# Patient Record
Sex: Female | Born: 1955 | Race: White | Hispanic: No | Marital: Married | Smoking: Former smoker
Health system: Southern US, Community
[De-identification: ages and names within clinical notes are randomized; demographics above are authoritative.]

## PROBLEM LIST (undated history)

## (undated) DIAGNOSIS — C50212 Malignant neoplasm of upper-inner quadrant of left female breast: Secondary | ICD-10-CM

## (undated) DIAGNOSIS — Z720 Tobacco use: Secondary | ICD-10-CM

## (undated) DIAGNOSIS — F32A Depression, unspecified: Secondary | ICD-10-CM

## (undated) DIAGNOSIS — Z9221 Personal history of antineoplastic chemotherapy: Secondary | ICD-10-CM

## (undated) DIAGNOSIS — J189 Pneumonia, unspecified organism: Secondary | ICD-10-CM

## (undated) DIAGNOSIS — F329 Major depressive disorder, single episode, unspecified: Secondary | ICD-10-CM

## (undated) DIAGNOSIS — F419 Anxiety disorder, unspecified: Secondary | ICD-10-CM

## (undated) HISTORY — DX: Malignant neoplasm of upper-inner quadrant of left female breast: C50.212

## (undated) HISTORY — PX: CHOLECYSTECTOMY: SHX55

## (undated) HISTORY — DX: Anxiety disorder, unspecified: F41.9

## (undated) HISTORY — PX: OTHER SURGICAL HISTORY: SHX169

---

## 2009-06-01 ENCOUNTER — Ambulatory Visit (HOSPITAL_COMMUNITY): Admission: RE | Admit: 2009-06-01 | Discharge: 2009-06-02 | Payer: Self-pay | Admitting: Obstetrics and Gynecology

## 2009-06-01 ENCOUNTER — Encounter (INDEPENDENT_AMBULATORY_CARE_PROVIDER_SITE_OTHER): Payer: Self-pay | Admitting: Obstetrics and Gynecology

## 2009-08-28 ENCOUNTER — Encounter: Admission: RE | Admit: 2009-08-28 | Discharge: 2009-08-28 | Payer: Self-pay | Admitting: Obstetrics and Gynecology

## 2009-10-21 HISTORY — PX: ABDOMINAL HYSTERECTOMY: SHX81

## 2010-07-06 ENCOUNTER — Encounter (INDEPENDENT_AMBULATORY_CARE_PROVIDER_SITE_OTHER): Payer: Self-pay | Admitting: *Deleted

## 2010-08-17 ENCOUNTER — Encounter (INDEPENDENT_AMBULATORY_CARE_PROVIDER_SITE_OTHER): Payer: Self-pay

## 2010-08-21 ENCOUNTER — Ambulatory Visit: Payer: Self-pay | Admitting: Gastroenterology

## 2010-09-05 ENCOUNTER — Ambulatory Visit: Payer: Self-pay | Admitting: Gastroenterology

## 2010-09-11 ENCOUNTER — Encounter: Payer: Self-pay | Admitting: Gastroenterology

## 2010-11-20 NOTE — Letter (Signed)
Summary: Results Letter  [facility]  [address] [place], [place] [address]   Phone: [phone]  Fax: [phone]        September 11, 2010 MRN: [id number]    [facility] [address] [place], [place]  [address]    Dear Ms. Heppler,  Good news.  The polyp(s) that were removed during your recent procedure were NOT pre-cancerous.  Given your daughter's history of pre-cancerous colon polyps you will need a repeat colonoscopy in 5 years.  We will put your information in our reminder system and contact you around that time.     Sincerely,  Rachael Fee MD  This letter has been electronically signed by your physician.  Appended Document: Results Letter Letter mailed

## 2010-11-20 NOTE — Letter (Signed)
Summary: [facility] Instructions  [facility]  [address], [place] [address]   Phone: [phone]  Fax: [phone]       [name]    09-Jan-1956    MRN: [id number]        Procedure Day Dorna Bloom:  Wednesday 09/05/2010     Arrival Time:  8:00 am      Procedure Time:  9:00 am     Location of Procedure:                    _x _  [facility] (4th Floor)                        PREPARATION FOR COLONOSCOPY WITH MOVIPREP   Starting 5 days prior to your procedure Friday 11/11 do not eat nuts, seeds, popcorn, corn, beans, peas,  salads, or any raw vegetables.  Do not take any fiber supplements (e.g. Metamucil, Citrucel, and Benefiber).  THE DAY BEFORE YOUR PROCEDURE         DATE: Tuesday 11/15  1.  Drink clear liquids the entire day-NO SOLID FOOD  2.  Do not drink anything colored red or purple.  Avoid juices with pulp.  No orange juice.  3.  Drink at least 64 oz. (8 glasses) of fluid/clear liquids during the day to prevent dehydration and help the prep work efficiently.  CLEAR LIQUIDS INCLUDE: Water Jello Ice Popsicles Tea (sugar ok, no milk/cream) Powdered fruit flavored drinks Coffee (sugar ok, no milk/cream) Gatorade Juice: apple, white grape, white cranberry  Lemonade Clear bullion, consomm, broth Carbonated beverages (any kind) Strained chicken noodle soup Hard Candy                             4.  In the morning, mix first dose of MoviPrep solution:    Empty 1 Pouch A and 1 Pouch B into the disposable container    Add lukewarm drinking water to the top line of the container. Mix to dissolve    Refrigerate (mixed solution should be used within 24 hrs)  5.  Begin drinking the prep at 5:00 p.m. The MoviPrep container is divided by 4 marks.   Every 15 minutes drink the solution down to the next mark (approximately 8 oz) until the full liter is complete.   6.  Follow completed prep with 16 oz of clear liquid of your choice  (Nothing red or purple).  Continue to drink clear liquids until bedtime.  7.  Before going to bed, mix second dose of MoviPrep solution:    Empty 1 Pouch A and 1 Pouch B into the disposable container    Add lukewarm drinking water to the top line of the container. Mix to dissolve    Refrigerate  THE DAY OF YOUR PROCEDURE      DATE: Wednesday 11/16  Beginning at 4:00 a.m. (5 hours before procedure):         1. Every 15 minutes, drink the solution down to the next mark (approx 8 oz) until the full liter is complete.  2. Follow completed prep with 16 oz. of clear liquid of your choice.    3. You may drink clear liquids until 7:00 am (2 HOURS BEFORE PROCEDURE).   MEDICATION INSTRUCTIONS  Unless otherwise instructed, you should take regular prescription medications with a small sip of water   as early as possible the  morning of your procedure.        OTHER INSTRUCTIONS  You will need a responsible adult at least 54 years of age to accompany you and drive you home.   This person must remain in the waiting room during your procedure.  Wear loose fitting clothing that is easily removed.  Leave jewelry and other valuables at home.  However, you may wish to bring a book to read or  an iPod/MP3 player to listen to music as you wait for your procedure to start.  Remove all body piercing jewelry and leave at home.  Total time from sign-in until discharge is approximately 2-3 hours.  You should go home directly after your procedure and rest.  You can resume normal activities the  day after your procedure.  The day of your procedure you should not:   Drive   Make legal decisions   Operate machinery   Drink alcohol   Return to work  You will receive specific instructions about eating, activities and medications before you leave.    The above instructions have been reviewed and explained to me by   Ulis Rias RN  August 21, 2010 10:23 AM     I fully understand  and can verbalize these instructions _____________________________ Date _________

## 2010-11-20 NOTE — Letter (Signed)
Summary: Pre Visit Letter Revised  [facility]  [address] [place], [place] [address]   Phone: [phone]  Fax: [phone]        07/06/2010 MRN: [id number]   [facility] [address] [place], [place]  [address]              Welcome to the Gastroenterology Division at [facility].    You are scheduled to see a nurse for your pre-procedure visit on August 21, 2010 at 10:00am on the 3rd floor at [facility], [address]  We ask that you try to arrive at our office 15 minutes prior to your appointment time to allow for check-in.  Please take a minute to review the attached form.  If you answer "Yes" to one or more of the questions on the first page, we ask that you call the person listed at your earliest opportunity.  If you answer "No" to all of the questions, please complete the rest of the form and bring it to your appointment.    Your nurse visit will consist of discussing your medical and surgical history, your immediate family medical history, and your medications.   If you are unable to list all of your medications on the form, please bring the medication bottles to your appointment and we will list them.  We will need to be aware of both prescribed and over the counter drugs.  We will need to know exact dosage information as well.    Please be prepared to read and sign documents such as consent forms, a financial agreement, and acknowledgement forms.  If necessary, and with your consent, a friend or relative is welcome to sit-in on the nurse visit with you.  Please bring your insurance card so that we may make a copy of it.  If your insurance requires a referral to see a specialist, please bring your referral form from your primary care physician.  No co-pay is required for this nurse visit.     If you cannot keep your appointment, please call [phone] to cancel or reschedule prior to your appointment date.  This allows [place]  the opportunity to schedule an appointment for another patient in need of care.    Thank you for choosing [facility] for your medical needs.  We appreciate the opportunity to care for you.  Please visit [place] at our website  to learn more about our practice.  Sincerely, The Gastroenterology Division

## 2010-11-20 NOTE — Procedures (Signed)
Summary: Colonoscopy  Patient: Tomeka Kantner Note: All result statuses are Final unless otherwise noted.  Tests: (1) Colonoscopy (COL)   COL Colonoscopy           DONE     [facility]     [address]     [place], [place]  [address]           COLONOSCOPY PROCEDURE REPORT           PATIENT:  [name], [name]  MR#:  [id number]     BIRTHDATE:  1956/01/28, 54 yrs. old  GENDER:  female     ENDOSCOPIST:  Rachael Fee, MD     REF. BY:  Marcelle Overlie, M.D.     PROCEDURE DATE:  09/05/2010     PROCEDURE:  Colonoscopy with snare polypectomy     ASA CLASS:  Class II     INDICATIONS:  family Hx of polyps, daughter had pre-cancerous     colon polyp removed     MEDICATIONS:   Fentanyl 100 mcg IV, Versed 10 mg IV           DESCRIPTION OF PROCEDURE:   After the risks benefits and     alternatives of the procedure were thoroughly explained, informed     consent was obtained.  Digital rectal exam was performed and     revealed no rectal masses.   The LB PCF-H180AL [id number] endoscope     was introduced through the anus and advanced to the cecum, which     was identified by both the appendix and ileocecal valve, without     limitations.  The quality of the prep was good, using MoviPrep.     The instrument was then slowly withdrawn as the colon was fully     examined.     <<PROCEDUREIMAGES>>     FINDINGS:  A diminutive polyp was found in the rectum. This was     3mm across, removed with cold snare and sent to pathology (jar 1)     (see image6 and image8).  Moderate diverticulosis was found in the     sigmoid to descending colon segments (see image5).  This was     otherwise a normal examination of the colon (see image7, image3,     and image4).   Retroflexed views in the rectum revealed no     abnormalities.    The scope was then withdrawn from the patient     and the procedure completed.     COMPLICATIONS:  None\           ENDOSCOPIC IMPRESSION:     1) Diminutive polyp in the  rectum, removed and sent to pathology           2) Moderate diverticulosis in the sigmoid to descending colon     segments     3) Otherwise normal examination           RECOMMENDATIONS:     1) Given your significant family history of precancerous polyps     (daughter), you should have a repeat colonoscopy in 5 years even     if the polyp removed today is NOT precancerous     2) You will receive a letter within 1-2 weeks with the results     of your biopsy as well as final recommendations. Please call my     office if you have not received a letter after 3 weeks.  ______________________________     Rachael Fee, MD           n.     eSIGNED:   Rachael Fee at 09/05/2010 09:21 AM           Gustavus Michelin, [id number]  Note: An exclamation mark (!) indicates a result that was not dispersed into the flowsheet. Document Creation Date: 09/05/2010 9:22 AM _______________________________________________________________________  (1) Order result status: Final Collection or observation date-time: 09/05/2010 09:15 Requested date-time:  Receipt date-time:  Reported date-time:  Referring Physician:   Ordering Physician: Rob Bunting [phone]) Specimen Source:  Source: Launa Grill Order Number: [id number] Lab site:   Appended Document: Colonoscopy     Procedures Next Due Date:    Colonoscopy: 08/2015

## 2010-11-20 NOTE — Miscellaneous (Signed)
Summary: Lec previsit  Clinical Lists Changes  Medications: Added new medication of MOVIPREP 100 GM  SOLR (PEG-KCL-NACL-NASULF-NA ASC-C) As per prep instructions. - Signed Rx of MOVIPREP 100 GM  SOLR (PEG-KCL-NACL-NASULF-NA ASC-C) As per prep instructions.;  #1 x 0;  Signed;  Entered by: Ulis Rias RN;  Authorized by: Rachael Fee MD;  Method used: Electronically to [facility]. [place]. [phone]*, [address], [place], [place], [place]  [address], Ph: [phone] or [phone], Fax: [phone] Observations: Added new observation of NKA: T (08/21/2010 9:59)    Prescriptions: MOVIPREP 100 GM  SOLR (PEG-KCL-NACL-NASULF-NA ASC-C) As per prep instructions.  #1 x 0   Entered by:   Ulis Rias RN   Authorized by:   Rachael Fee MD   Signed by:   Ulis Rias RN on 08/21/2010   Method used:   Electronically to        [facility]. [address] [phone]* (retail)       [address]       [place], [place]  [address]       Ph: [phone] or [phone]       Fax: [phone]   RxID:   [id number]

## 2011-01-27 LAB — BASIC METABOLIC PANEL
CO2: 26 mEq/L (ref 19–32)
Calcium: 10 mg/dL (ref 8.4–10.5)
Chloride: 106 mEq/L (ref 96–112)
GFR calc Af Amer: >60 mL/min (ref >60)
Potassium: 3.9 mEq/L (ref 3.5–5.1)
Sodium: 140 mEq/L (ref 135–145)

## 2011-01-27 LAB — CBC
HCT: 35.5 % — ABNORMAL LOW (ref 36.0–46.0)
Hemoglobin: 15.4 g/dL — ABNORMAL HIGH (ref 12.0–15.0)
MCHC: 33.9 g/dL (ref 30.0–36.0)
MCV: 91.3 fL (ref 78.0–100.0)
Platelets: 209 10*3/uL (ref 150–400)
RBC: 4.99 MIL/uL (ref 3.87–5.11)
WBC: 11.3 10*3/uL — ABNORMAL HIGH (ref 4.0–10.5)

## 2011-02-05 ENCOUNTER — Other Ambulatory Visit: Payer: Self-pay | Admitting: Obstetrics and Gynecology

## 2011-02-05 DIAGNOSIS — Z1231 Encounter for screening mammogram for malignant neoplasm of breast: Secondary | ICD-10-CM

## 2011-03-05 NOTE — Op Note (Signed)
NAMETRITIA, ENDO               ACCOUNT NO.:  [id number]   MEDICAL RECORD NO.:  [id number]          PATIENT TYPE:  OIB   LOCATION:  9304                          FACILITY:  [facility]   PHYSICIAN:  Michelle L. Grewal, M.D.DATE OF BIRTH:  Dec 28, 1955   DATE OF PROCEDURE:  06/01/2009  DATE OF DISCHARGE:                               OPERATIVE REPORT   PREOPERATIVE DIAGNOSES:  Bilateral adnexal masses and family history of  ovarian cancer.   POSTOPERATIVE DIAGNOSES:  Endometriosis, endometrioma involving the left  ovary and possibly the right, and adhesions in the cul-de-sac.   PROCEDURES:  laparoscopic-assisted vaginal hysterectomy, bilateral  salpingo-oophorectomy, and lysis of adhesions.   SURGEON:  Michelle L. Vincente Poli, MD   ANESTHESIA:  General.   ESTIMATED BLOOD LOSS:  450 mL.   DRAINS:  Foley.   SPECIMENS:  Uterus, cervix, bilateral tubes and ovaries.   ASSISTANT:  Juluis Mire, MD   PROCEDURE IN DETAILS:  The patient was taken to the operating room.  She  was intubated without difficulty.  She was then prepped and draped in  the usual sterile fashion.  Foley catheter was inserted.  The uterine  manipulator was inserted as well.  Attention was turned to the abdomen  where a small infraumbilical incision was made, a Veress needle was  inserted, pneumoperitoneum was performed.  An 11-mm trocar was inserted.  The laparoscope was introduced through the trocar sheath.  The patient  was gently placed in Trendelenburg position.  The abdomen and pelvis  were inspected.  The uterus was normal except for there was a small  little subserosal fibroid.  Her right ovary looked normal, but there was  a suspicion or maybe there was some endometriosis within the ovary,  there was a little bluish appearance to the ovary.  There were some  adhesions in the cul-de-sac that were noted.  The left ovary was  adherent to the sidewall into the side of the ovary and was slightly  enlarged and  appeared consistent with endometriosis.  The EnSeal  instrument was used to grasp IP ligament on the right side.  Careful  attention was used to avoid the ureter which was in its normal location.  The IP ligament was then cauterized and this was carried down to the  round ligament on the right side.  This was done in an identical fashion  on the left side.  Hemostasis was excellent.  The pneumoperitoneum was  released.  The weighted speculum was placed in the vagina.  The cervix  was grasped with a tenaculum and a circumferential incision was made  with the Bovie.  The posterior cul-de-sac was entered sharply using Mayo  scissors and the anterior cul-de-sac was entered sharply using  Metzenbaum scissors.  The curved Heaney clamps were placed across the  cardinal uterosacral ligaments on either side.  Each pedicle was  clamped, cut and suture ligated using 0 Vicryl suture.  Once we reached  the level of the fundus, the uterus was retroflexed and the remainder of  the broad ligament was clamped on either side using curved Heaney  clamps.  The specimen was removed.  The pedicles were secured using a  suture ligature of 0 Vicryl suture.  The cuff was closed from 3 to 9  o'clock in a running locked stitch using 0 Vicryl suture and then the  cuff was closed completely anterior to posterior using 0 Vicryl suture  in a running stitch.  At this point, pneumoperitoneum was replaced.  The  pelvis was irrigated.  Hemostasis was excellent.  Because she had some  adhesions in the cul-de-sac, I placed a small piece of Interceed over  the vaginal cuff to hopefully minimize a bowel adhesion in the future.  Pneumoperitoneum was released.  The infraumbilical incision was closed  with an 0 Vicryl stitch and then the skin was closed at both sites with  3-0 Vicryl interrupted.  All sponge, lap and instrument counts were  correct x2.  The patient was extubated and went to recovery room in  stable  condition.      Michelle L. Vincente Poli, M.D.  Electronically Signed     MLG/MEDQ  D:  06/01/2009  T:  06/01/2009  Job:  [id number]

## 2011-03-05 NOTE — H&P (Signed)
[name], [name]               ACCOUNT NO.:  [id number]   MEDICAL RECORD NO.:  [id number]          PATIENT TYPE:  AMB   LOCATION:                                [facility]ILITY:  [facility]   PHYSICIAN:  Michelle L. Grewal, M.D.DATE OF BIRTH:  1956-03-22   DATE OF ADMISSION:  06/01/2009  DATE OF DISCHARGE:                              HISTORY & PHYSICAL   HISTORY OF PRESENT ILLNESS:  This is a 55 year old, G1, P1, LMP 2 years  ago, who presents today for an LAVH BSO.  Her family history is  significant that her sister was diagnosed with stage III C ovarian  cancer at age of 22.  She is under the care of Dr. Stanford Breed at  [facility].  Because of this history, she was seen by Dr. Saralyn Pilar at [facility],  and she had an ultrasound that was done in July 2009 which showed a  small 8-mm dermoid on the right ovary.  It does bother her sometime.  She had a CA-125 that was normal and then we did another ultrasound June  30.  On June 30, the ultrasound of the right ovary showed a 16 x 10 x 12-  mm cystic area with a 10-mm solid area.  No blood placing within the  cyst, questionable dermoid.  On the left ovary, there is also a 16 x 10  x 11- mm cystic area with a 11-mm solid area.  No blood placing within  the cyst, questionable dermoid.  This is the change from previous  ultrasound.  Because of these changes and because of her family history,  the patient elected to have a BSO and had wanted to have a hysterectomy  done at the same time.   MEDICAL HISTORY:  The patient has a history of basal cell carcinoma.   SURGICAL HISTORY:  She has had 1 vaginal delivery in 1984.  She had a  ORIF of the left ankle 3 years ago.   FAMILY HISTORY:  Significant for ovarian cancer.  Her mother had  hypertension.  Her father had diabetes.  Her mother had gallbladder  disease.  Her mother had congestive heart failure.  Her sister has had  anemia probably related to recent chemotherapy.   MEDICATIONS:  1. Zoloft 25 mg a  day.  2. BuSpar 15 mg one and a half in the morning and 1 at night.  3. Gabapentin 300 mg 1 in the morning and 1 at night.   ALLERGIES:  She has no known allergies.   SOCIAL HISTORY:  She does smoke one-half pack per cigarettes per day.  She denies any social drug use.  Denies any alcohol use.  She is  married.   REVIEW OF SYSTEMS:  Remarkable for hot flashes.   PHYSICAL EXAMINATION:  VITAL SIGNS:  Her BP 148/90, her height 5 feet 9  inches, weight 190.  GENERAL:  She is alert and oriented in no apparent distress.  LUNGS:  Clear to auscultation bilaterally.  CARDIAC:  Regular rate and rhythm.  BREASTS:  Soft, nontender, no masses.  PELVIC:  External genitalia within normal  limits.  Vagina appears  normal.  Cervix, no lesions.  She has good support.  Uterus is normal  size, mid positional, nontender.  Adnexa nontender bilaterally.   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   PLAN:  We will go ahead and proceed with LAVH and BSO.  The risk of the  surgery discussed with the patient which include the risk of anesthesia,  risk of injury to surrounding organs, risk of infection, risk of venous  thromboembolism, and pulmonary embolism, and we will proceed with  surgery.      Michelle L. Vincente Poli, M.D.  Electronically Signed     MLG/MEDQ  D:  05/31/2009  T:  06/01/2009  Job:  [id number]

## 2011-04-01 ENCOUNTER — Ambulatory Visit
Admission: RE | Admit: 2011-04-01 | Discharge: 2011-04-01 | Disposition: A | Discharge status: Home / Self Care | Payer: BC Managed Care – PPO | Source: Ambulatory Visit | Attending: Obstetrics and Gynecology | Admitting: Obstetrics and Gynecology

## 2011-04-01 DIAGNOSIS — Z1231 Encounter for screening mammogram for malignant neoplasm of breast: Secondary | ICD-10-CM

## 2012-09-10 ENCOUNTER — Other Ambulatory Visit: Payer: Self-pay | Admitting: Obstetrics and Gynecology

## 2012-12-21 ENCOUNTER — Encounter (INDEPENDENT_AMBULATORY_CARE_PROVIDER_SITE_OTHER): Payer: Self-pay

## 2013-01-08 ENCOUNTER — Ambulatory Visit (INDEPENDENT_AMBULATORY_CARE_PROVIDER_SITE_OTHER): Payer: BC Managed Care – PPO | Admitting: General Surgery

## 2013-01-08 ENCOUNTER — Encounter (INDEPENDENT_AMBULATORY_CARE_PROVIDER_SITE_OTHER): Payer: Self-pay | Admitting: General Surgery

## 2013-01-08 VITALS — BP 122/80 | HR 80 | Temp 98.2°F | Resp 18 | Ht 69.0 in | Wt 191.0 lb

## 2013-01-08 DIAGNOSIS — K8010 Calculus of gallbladder with chronic cholecystitis without obstruction: Secondary | ICD-10-CM

## 2013-01-08 NOTE — Progress Notes (Signed)
Subjective:   right upper quadrant abdominal pain, gallstones  Patient ID: [name], female   DOB: Dec 14, 1955, 57 y.o.   MRN: [id number]  HPI Patient is a very pleasant 57 year old female referred by Dr. Cyndia Bent. About a month ago she had an initial episode of right upper quadrant abdominal pain. This occurred at night. She describes the rapid onset of sharp right upper quadrant abdominal pain radiating somewhat around her flank to her back. This was not associated with any nausea or vomiting. She saw Dr. Cyndia Bent and an abdominal ultrasound was obtained. This shows a somewhat small contracted gallbladder with normal wall thickness, sludge and at least one 1 cm gallstone. Somewhat increased liver echogenicity was also noted. She states that since that time she has had episodes of right upper quadrant discomfort, not severe. She has no symptoms between episodes. No associated GI symptoms. No fever chills or jaundice. She had no GI symptoms or any history of abdominal complaints prior to last month. Past Medical History  Diagnosis Date  . Anxiety    Past Surgical History  Procedure Laterality Date  . Open reduction left ankle    . Abdominal hysterectomy  2011    Laparoscopic   Current Outpatient Prescriptions  Medication Sig Dispense Refill  . ALPRAZolam (XANAX) 0.5 MG tablet       . busPIRone (BUSPAR) 30 MG tablet       . PARoxetine (PAXIL) 40 MG tablet       . VIIBRYD 40 MG TABS        No current facility-administered medications for this visit.   No Known Allergies History  Substance Use Topics  . Smoking status: Not on file  . Smokeless tobacco: Not on file  . Alcohol Use: Not on file      Review of Systems  Constitutional: Negative.   Respiratory: Negative.   Cardiovascular: Negative.   Gastrointestinal: Positive for abdominal pain. Negative for nausea, vomiting and diarrhea.  Psychiatric/Behavioral: Positive for dysphoric mood. The patient is nervous/anxious.         Objective:   Physical Exam BP 122/80  Pulse 80  Temp(Src) 98.2 F (36.8 C)  Resp 18  Ht 5\' 9"  (1.753 m)  Wt 191 lb (86.637 kg)  BMI 28.19 kg/m2 General: Alert, well-developed Caucasian female, in no distress Skin: Warm and dry without rash or infection. HEENT: No palpable masses or thyromegaly. Sclera nonicteric. Pupils equal round and reactive. Oropharynx clear. Lymph nodes: No cervical, supraclavicular, or inguinal nodes palpable. Lungs: Breath sounds clear and equal without increased work of breathing Cardiovascular: Regular rate and rhythm without murmur. No JVD or edema. Peripheral pulses intact. Abdomen: Nondistended. Soft and nontender. No masses palpable. No organomegaly. No palpable hernias. Extremities: No edema or joint swelling or deformity. No chronic venous stasis changes. Neurologic: Alert and fully oriented. Gait normal.    Assessment:     Recurrent right upper quadrant abdominal pain entirely consistent with biliary colic or early cholecystitis and gallbladder ultrasound confirming cholelithiasis. I have recommended proceeding with laparoscopic cholecystectomy with cholangiogram to relieve her symptoms and prevent future complications from her gallbladder.I discussed the procedure in detail.  The patient was given Agricultural engineer.  We discussed the risks and benefits of a laparoscopic cholecystectomy and possible cholangiogram including, but not limited to bleeding, infection, injury to surrounding structures such as the intestine or liver, bile leak, retained gallstones, need to convert to an open procedure, prolonged diarrhea, blood clots such as  DVT, common bile duct injury,  anesthesia risks, and possible need for additional procedures.  The likelihood of improvement in symptoms and return to the patient's normal status is good. We discussed the typical post-operative recovery course.    Plan:     Laparoscopic cholecystectomy with intraoperative cholangiogram  under general anesthesia as an outpatient

## 2013-01-18 ENCOUNTER — Other Ambulatory Visit (INDEPENDENT_AMBULATORY_CARE_PROVIDER_SITE_OTHER): Payer: Self-pay | Admitting: General Surgery

## 2013-01-18 ENCOUNTER — Ambulatory Visit
Admission: RE | Admit: 2013-01-18 | Discharge: 2013-01-18 | Disposition: A | Discharge status: Home / Self Care | Payer: BC Managed Care – PPO | Source: Ambulatory Visit | Attending: General Surgery | Admitting: General Surgery

## 2013-01-18 DIAGNOSIS — Z01811 Encounter for preprocedural respiratory examination: Secondary | ICD-10-CM

## 2013-01-26 ENCOUNTER — Other Ambulatory Visit (INDEPENDENT_AMBULATORY_CARE_PROVIDER_SITE_OTHER): Payer: Self-pay | Admitting: General Surgery

## 2013-01-26 DIAGNOSIS — K824 Cholesterolosis of gallbladder: Secondary | ICD-10-CM

## 2013-01-26 DIAGNOSIS — K8010 Calculus of gallbladder with chronic cholecystitis without obstruction: Secondary | ICD-10-CM

## 2013-02-18 ENCOUNTER — Ambulatory Visit (INDEPENDENT_AMBULATORY_CARE_PROVIDER_SITE_OTHER): Payer: BC Managed Care – PPO | Admitting: General Surgery

## 2013-02-18 ENCOUNTER — Encounter (INDEPENDENT_AMBULATORY_CARE_PROVIDER_SITE_OTHER): Payer: Self-pay | Admitting: General Surgery

## 2013-02-18 VITALS — BP 124/83 | HR 74 | Temp 98.1°F | Resp 12 | Ht 69.0 in | Wt 194.4 lb

## 2013-02-18 DIAGNOSIS — K8010 Calculus of gallbladder with chronic cholecystitis without obstruction: Secondary | ICD-10-CM

## 2013-02-18 NOTE — Progress Notes (Signed)
Patient returns for a postop check following laparoscopic cholecystectomy for cholelithiasis and chronic cholecystitis. She reports she is doing well with no difficulties postoperatively and she has had relief of her preoperative symptoms.  Exam: BP 124/83  Pulse 74  Temp(Src) 98.1 F (36.7 C) (Temporal)  Resp 12  Ht 5\' 9"  (1.753 m)  Wt 194 lb 6.4 oz (88.179 kg)  BMI 28.69 kg/m2 She appears well. Abdomen is soft and nontender and wounds well healed.  Pathology confirmed chronic cholecystitis and one large gallstone.  Assessment and plan: Doing well following laparoscopic cholecystectomy without complication and good relief of her symptoms. All her questions were answered and she is discharged return as needed.

## 2013-07-20 ENCOUNTER — Encounter (INDEPENDENT_AMBULATORY_CARE_PROVIDER_SITE_OTHER): Payer: Self-pay

## 2014-06-13 ENCOUNTER — Other Ambulatory Visit: Payer: Self-pay

## 2014-06-13 DIAGNOSIS — Z1231 Encounter for screening mammogram for malignant neoplasm of breast: Secondary | ICD-10-CM

## 2014-07-25 ENCOUNTER — Ambulatory Visit
Admission: RE | Admit: 2014-07-25 | Discharge: 2014-07-25 | Disposition: A | Discharge status: Home / Self Care | Payer: BC Managed Care – PPO | Source: Ambulatory Visit

## 2014-07-25 DIAGNOSIS — Z1231 Encounter for screening mammogram for malignant neoplasm of breast: Secondary | ICD-10-CM

## 2014-08-02 ENCOUNTER — Other Ambulatory Visit: Payer: Self-pay | Admitting: Obstetrics and Gynecology

## 2014-08-02 DIAGNOSIS — R928 Other abnormal and inconclusive findings on diagnostic imaging of breast: Secondary | ICD-10-CM

## 2014-08-05 ENCOUNTER — Ambulatory Visit
Admission: RE | Admit: 2014-08-05 | Discharge: 2014-08-05 | Disposition: A | Discharge status: Home / Self Care | Payer: BC Managed Care – PPO | Source: Ambulatory Visit | Attending: Obstetrics and Gynecology | Admitting: Obstetrics and Gynecology

## 2014-08-05 DIAGNOSIS — R928 Other abnormal and inconclusive findings on diagnostic imaging of breast: Secondary | ICD-10-CM

## 2015-02-27 ENCOUNTER — Other Ambulatory Visit: Payer: Self-pay | Admitting: Obstetrics and Gynecology

## 2015-02-27 DIAGNOSIS — C50912 Malignant neoplasm of unspecified site of left female breast: Secondary | ICD-10-CM

## 2015-02-27 DIAGNOSIS — N6002 Solitary cyst of left breast: Secondary | ICD-10-CM

## 2015-04-03 ENCOUNTER — Other Ambulatory Visit: Payer: Self-pay

## 2015-05-17 ENCOUNTER — Other Ambulatory Visit: Payer: Self-pay

## 2015-05-24 ENCOUNTER — Other Ambulatory Visit: Payer: Self-pay

## 2015-05-31 ENCOUNTER — Ambulatory Visit
Admission: RE | Admit: 2015-05-31 | Discharge: 2015-05-31 | Disposition: A | Discharge status: Home / Self Care | Payer: BLUE CROSS/BLUE SHIELD | Source: Ambulatory Visit | Attending: Obstetrics and Gynecology | Admitting: Obstetrics and Gynecology

## 2015-05-31 DIAGNOSIS — N6002 Solitary cyst of left breast: Secondary | ICD-10-CM

## 2015-08-07 ENCOUNTER — Encounter: Payer: Self-pay | Admitting: Gastroenterology

## 2015-12-27 ENCOUNTER — Emergency Department (HOSPITAL_COMMUNITY): Payer: BLUE CROSS/BLUE SHIELD

## 2015-12-27 ENCOUNTER — Encounter (HOSPITAL_COMMUNITY): Payer: Self-pay | Admitting: *Deleted

## 2015-12-27 ENCOUNTER — Inpatient Hospital Stay (HOSPITAL_COMMUNITY)
Admission: EM | Admit: 2015-12-27 | Discharge: 2016-01-07 | DRG: 870 | Disposition: A | Discharge status: Home / Self Care | Payer: BLUE CROSS/BLUE SHIELD | Attending: Pulmonary Disease | Admitting: Pulmonary Disease

## 2015-12-27 DIAGNOSIS — A419 Sepsis, unspecified organism: Secondary | ICD-10-CM | POA: Diagnosis not present

## 2015-12-27 DIAGNOSIS — G9341 Metabolic encephalopathy: Secondary | ICD-10-CM | POA: Diagnosis present

## 2015-12-27 DIAGNOSIS — D72829 Elevated white blood cell count, unspecified: Secondary | ICD-10-CM | POA: Diagnosis not present

## 2015-12-27 DIAGNOSIS — Z452 Encounter for adjustment and management of vascular access device: Secondary | ICD-10-CM

## 2015-12-27 DIAGNOSIS — Z23 Encounter for immunization: Secondary | ICD-10-CM

## 2015-12-27 DIAGNOSIS — J9601 Acute respiratory failure with hypoxia: Secondary | ICD-10-CM | POA: Diagnosis not present

## 2015-12-27 DIAGNOSIS — R7989 Other specified abnormal findings of blood chemistry: Secondary | ICD-10-CM | POA: Diagnosis present

## 2015-12-27 DIAGNOSIS — J189 Pneumonia, unspecified organism: Secondary | ICD-10-CM | POA: Diagnosis not present

## 2015-12-27 DIAGNOSIS — R0902 Hypoxemia: Secondary | ICD-10-CM

## 2015-12-27 DIAGNOSIS — E876 Hypokalemia: Secondary | ICD-10-CM | POA: Diagnosis present

## 2015-12-27 DIAGNOSIS — F17210 Nicotine dependence, cigarettes, uncomplicated: Secondary | ICD-10-CM | POA: Diagnosis present

## 2015-12-27 DIAGNOSIS — J80 Acute respiratory distress syndrome: Secondary | ICD-10-CM | POA: Diagnosis not present

## 2015-12-27 DIAGNOSIS — R6521 Severe sepsis with septic shock: Secondary | ICD-10-CM | POA: Diagnosis present

## 2015-12-27 DIAGNOSIS — R05 Cough: Secondary | ICD-10-CM | POA: Diagnosis not present

## 2015-12-27 DIAGNOSIS — R739 Hyperglycemia, unspecified: Secondary | ICD-10-CM | POA: Diagnosis present

## 2015-12-27 DIAGNOSIS — T380X5A Adverse effect of glucocorticoids and synthetic analogues, initial encounter: Secondary | ICD-10-CM | POA: Diagnosis present

## 2015-12-27 DIAGNOSIS — E872 Acidosis: Secondary | ICD-10-CM | POA: Diagnosis present

## 2015-12-27 DIAGNOSIS — K5900 Constipation, unspecified: Secondary | ICD-10-CM | POA: Diagnosis not present

## 2015-12-27 DIAGNOSIS — J9690 Respiratory failure, unspecified, unspecified whether with hypoxia or hypercapnia: Secondary | ICD-10-CM

## 2015-12-27 DIAGNOSIS — E870 Hyperosmolality and hypernatremia: Secondary | ICD-10-CM | POA: Diagnosis present

## 2015-12-27 DIAGNOSIS — D6489 Other specified anemias: Secondary | ICD-10-CM | POA: Diagnosis present

## 2015-12-27 DIAGNOSIS — J9600 Acute respiratory failure, unspecified whether with hypoxia or hypercapnia: Secondary | ICD-10-CM | POA: Diagnosis present

## 2015-12-27 DIAGNOSIS — F419 Anxiety disorder, unspecified: Secondary | ICD-10-CM | POA: Diagnosis present

## 2015-12-27 DIAGNOSIS — Z720 Tobacco use: Secondary | ICD-10-CM | POA: Diagnosis present

## 2015-12-27 DIAGNOSIS — F329 Major depressive disorder, single episode, unspecified: Secondary | ICD-10-CM | POA: Diagnosis present

## 2015-12-27 HISTORY — DX: Tobacco use: Z72.0

## 2015-12-27 HISTORY — DX: Depression, unspecified: F32.A

## 2015-12-27 HISTORY — DX: Pneumonia, unspecified organism: J18.9

## 2015-12-27 HISTORY — DX: Major depressive disorder, single episode, unspecified: F32.9

## 2015-12-27 LAB — URINE MICROSCOPIC-ADD ON

## 2015-12-27 LAB — URINALYSIS, ROUTINE W REFLEX MICROSCOPIC
Glucose, UA: NEGATIVE mg/dL
Hgb urine dipstick: NEGATIVE
KETONES UR: 15 mg/dL — AB
NITRITE: NEGATIVE
Protein, ur: 100 mg/dL — AB
SPECIFIC GRAVITY, URINE: 1.03 (ref 1.005–1.030)
pH: 5.5 (ref 5.0–8.0)

## 2015-12-27 LAB — APTT: aPTT: 30 seconds (ref 24–37)

## 2015-12-27 LAB — LIPASE, BLOOD: Lipase: 17 U/L (ref 11–51)

## 2015-12-27 LAB — COMPREHENSIVE METABOLIC PANEL
ALK PHOS: 298 U/L — AB (ref 38–126)
ALT: 40 U/L (ref 14–54)
ANION GAP: 21 — AB (ref 5–15)
AST: 60 U/L — ABNORMAL HIGH (ref 15–41)
Albumin: 2.4 g/dL — ABNORMAL LOW (ref 3.5–5.0)
BILIRUBIN TOTAL: 1.1 mg/dL (ref 0.3–1.2)
BUN: 16 mg/dL (ref 6–20)
CALCIUM: 9.4 mg/dL (ref 8.9–10.3)
CO2: 20 mmol/L — AB (ref 22–32)
CREATININE: 0.98 mg/dL (ref 0.44–1.00)
Chloride: 98 mmol/L — ABNORMAL LOW (ref 101–111)
Glucose, Bld: 129 mg/dL — ABNORMAL HIGH (ref 65–99)
Potassium: 3 mmol/L — ABNORMAL LOW (ref 3.5–5.1)
Sodium: 139 mmol/L (ref 135–145)
TOTAL PROTEIN: 7.2 g/dL (ref 6.5–8.1)

## 2015-12-27 LAB — CBC
HCT: 39.2 % (ref 36.0–46.0)
HEMOGLOBIN: 13.2 g/dL (ref 12.0–15.0)
MCH: 29.8 pg (ref 26.0–34.0)
MCHC: 33.7 g/dL (ref 30.0–36.0)
MCV: 88.5 fL (ref 78.0–100.0)
PLATELETS: 270 10*3/uL (ref 150–400)
RBC: 4.43 MIL/uL (ref 3.87–5.11)
RDW: 14.8 % (ref 11.5–15.5)
WBC: 18.2 10*3/uL — ABNORMAL HIGH (ref 4.0–10.5)

## 2015-12-27 LAB — I-STAT CG4 LACTIC ACID, ED: Lactic Acid, Venous: 3.56 mmol/L (ref 0.5–2.0)

## 2015-12-27 LAB — PROCALCITONIN: PROCALCITONIN: 9.15 ng/mL

## 2015-12-27 LAB — INFLUENZA PANEL BY PCR (TYPE A & B)
H1N1FLUPCR: NOT DETECTED
INFLBPCR: NEGATIVE
Influenza A By PCR: NEGATIVE

## 2015-12-27 LAB — LACTIC ACID, PLASMA: LACTIC ACID, VENOUS: 2.3 mmol/L — AB (ref 0.5–2.0)

## 2015-12-27 LAB — PROTIME-INR
INR: 1.32 (ref 0.00–1.49)
Prothrombin Time: 16.5 seconds — ABNORMAL HIGH (ref 11.6–15.2)

## 2015-12-27 LAB — TSH: TSH: 1.194 u[IU]/mL (ref 0.350–4.500)

## 2015-12-27 MED ORDER — ALBUTEROL SULFATE (2.5 MG/3ML) 0.083% IN NEBU
2.5000 mg | INHALATION_SOLUTION | Freq: Four times a day (QID) | RESPIRATORY_TRACT | Status: DC
  Administered 2015-12-27: 2.5 mg via RESPIRATORY_TRACT
  Filled 2015-12-27: qty 3

## 2015-12-27 MED ORDER — AZITHROMYCIN 500 MG PO TABS
500.0000 mg | ORAL_TABLET | ORAL | Status: DC
  Filled 2015-12-27: qty 1

## 2015-12-27 MED ORDER — ONDANSETRON HCL 4 MG PO TABS: 4.0000 mg | ORAL_TABLET | Freq: Four times a day (QID) | ORAL | Status: DC | PRN

## 2015-12-27 MED ORDER — ALBUTEROL SULFATE (2.5 MG/3ML) 0.083% IN NEBU
2.5000 mg | INHALATION_SOLUTION | RESPIRATORY_TRACT | Status: DC | PRN
  Administered 2015-12-28 – 2016-01-03 (×2): 2.5 mg via RESPIRATORY_TRACT
  Filled 2015-12-27 (×2): qty 3

## 2015-12-27 MED ORDER — MORPHINE SULFATE (PF) 2 MG/ML IV SOLN: 1.0000 mg | INTRAVENOUS | Status: DC | PRN

## 2015-12-27 MED ORDER — DEXTROSE 5 % IV SOLN
1.0000 g | Freq: Once | INTRAVENOUS | Status: AC
  Administered 2015-12-27: 1 g via INTRAVENOUS
  Filled 2015-12-27 (×2): qty 10

## 2015-12-27 MED ORDER — ALBUTEROL SULFATE (2.5 MG/3ML) 0.083% IN NEBU
2.5000 mg | INHALATION_SOLUTION | Freq: Three times a day (TID) | RESPIRATORY_TRACT | Status: DC
  Administered 2015-12-28 (×3): 2.5 mg via RESPIRATORY_TRACT
  Filled 2015-12-27 (×3): qty 3

## 2015-12-27 MED ORDER — SODIUM CHLORIDE 0.9 % IV SOLN: INTRAVENOUS | Status: DC

## 2015-12-27 MED ORDER — SENNOSIDES-DOCUSATE SODIUM 8.6-50 MG PO TABS: 1.0000 | ORAL_TABLET | Freq: Every evening | ORAL | Status: DC | PRN

## 2015-12-27 MED ORDER — SODIUM CHLORIDE 0.9% FLUSH
3.0000 mL | Freq: Two times a day (BID) | INTRAVENOUS | Status: DC
  Administered 2015-12-28 (×2): 3 mL via INTRAVENOUS
  Administered 2015-12-29: 9 mL via INTRAVENOUS
  Administered 2015-12-29 – 2016-01-01 (×5): 3 mL via INTRAVENOUS
  Administered 2016-01-02: 10 mL via INTRAVENOUS
  Administered 2016-01-03 – 2016-01-06 (×8): 3 mL via INTRAVENOUS

## 2015-12-27 MED ORDER — AZITHROMYCIN 500 MG PO TABS
500.0000 mg | ORAL_TABLET | ORAL | Status: DC
Start: 2015-12-27 — End: 2015-12-27

## 2015-12-27 MED ORDER — SODIUM CHLORIDE 0.9 % IV SOLN
INTRAVENOUS | Status: DC
  Administered 2015-12-27 (×2): via INTRAVENOUS

## 2015-12-27 MED ORDER — CEFTRIAXONE SODIUM 1 G IJ SOLR: 1.0000 g | INTRAMUSCULAR | Status: DC

## 2015-12-27 MED ORDER — HYDROCODONE-ACETAMINOPHEN 5-325 MG PO TABS
1.0000 | ORAL_TABLET | ORAL | Status: DC | PRN
  Administered 2015-12-28 (×2): 2 via ORAL
  Filled 2015-12-27 (×2): qty 2

## 2015-12-27 MED ORDER — DEXTROSE 5 % IV SOLN
1.0000 g | INTRAVENOUS | Status: DC
  Filled 2015-12-27: qty 10

## 2015-12-27 MED ORDER — POTASSIUM CHLORIDE 10 MEQ/100ML IV SOLN
10.0000 meq | INTRAVENOUS | Status: AC
Start: 2015-12-27 — End: 2015-12-27
  Administered 2015-12-27 (×2): 10 meq via INTRAVENOUS
  Filled 2015-12-27: qty 100

## 2015-12-27 MED ORDER — TRAZODONE 25 MG HALF TABLET
25.0000 mg | ORAL_TABLET | Freq: Every evening | ORAL | Status: DC | PRN
  Administered 2015-12-27: 25 mg via ORAL
  Filled 2015-12-27 (×2): qty 1

## 2015-12-27 MED ORDER — ONDANSETRON HCL 4 MG/2ML IJ SOLN: 4.0000 mg | Freq: Four times a day (QID) | INTRAMUSCULAR | Status: DC | PRN

## 2015-12-27 MED ORDER — ACETAMINOPHEN 650 MG RE SUPP: 650.0000 mg | Freq: Four times a day (QID) | RECTAL | Status: DC | PRN

## 2015-12-27 MED ORDER — POTASSIUM CHLORIDE 10 MEQ/100ML IV SOLN
INTRAVENOUS | Status: AC
  Filled 2015-12-27: qty 100

## 2015-12-27 MED ORDER — ACETAMINOPHEN 325 MG PO TABS
650.0000 mg | ORAL_TABLET | Freq: Four times a day (QID) | ORAL | Status: DC | PRN
  Administered 2015-12-27: 650 mg via ORAL
  Filled 2015-12-27 (×2): qty 2

## 2015-12-27 MED ORDER — DEXTROSE 5 % IV SOLN
500.0000 mg | Freq: Once | INTRAVENOUS | Status: AC
  Administered 2015-12-27: 500 mg via INTRAVENOUS
  Filled 2015-12-27 (×3): qty 500

## 2015-12-27 MED ORDER — GUAIFENESIN ER 600 MG PO TB12: 1200.0000 mg | ORAL_TABLET | Freq: Two times a day (BID) | ORAL | Status: DC | PRN

## 2015-12-27 MED ORDER — SODIUM CHLORIDE 0.9 % IV BOLUS (SEPSIS)
3000.0000 mL | Freq: Once | INTRAVENOUS | Status: AC
  Administered 2015-12-27: 3000 mL via INTRAVENOUS

## 2015-12-27 MED ORDER — PAROXETINE HCL 20 MG PO TABS
40.0000 mg | ORAL_TABLET | Freq: Every morning | ORAL | Status: DC
Start: 2015-12-28 — End: 2016-01-07
  Administered 2015-12-28 – 2016-01-07 (×10): 40 mg via ORAL
  Filled 2015-12-27 (×10): qty 2

## 2015-12-27 MED ORDER — POTASSIUM CHLORIDE CRYS ER 20 MEQ PO TBCR
40.0000 meq | EXTENDED_RELEASE_TABLET | Freq: Once | ORAL | Status: AC
  Administered 2015-12-27: 40 meq via ORAL
  Filled 2015-12-27: qty 2

## 2015-12-27 MED ORDER — ALUM & MAG HYDROXIDE-SIMETH 200-200-20 MG/5ML PO SUSP: 30.0000 mL | Freq: Four times a day (QID) | ORAL | Status: DC | PRN

## 2015-12-27 MED ORDER — ENOXAPARIN SODIUM 40 MG/0.4ML ~~LOC~~ SOLN
40.0000 mg | SUBCUTANEOUS | Status: DC
  Administered 2015-12-27 – 2016-01-04 (×9): 40 mg via SUBCUTANEOUS
  Filled 2015-12-27 (×9): qty 0.4

## 2015-12-27 NOTE — ED Provider Notes (Addendum)
CSN: [id number]     Arrival date & time 12/27/15  1056 History   First MD Initiated Contact with Patient 12/27/15 1435     Chief Complaint  Patient presents with  . Cough     HPI  Patient presents evaluation of fever, bodyaches, cough weakness and shortness of breath.  She describes symptoms starting 9 days ago on a Monday. States she felt bodyaches joint pain and myalgias. That night developed a fever. States she "crashed" and went to bed and slept all evening and all the next morning. She felt better the next day and try to grab Dulera ends. Symptoms recurred with fever body aches developed a cough and chills. The patient's nausea and vomiting. By day 4 was feeling somewhat improved.  Her, she is continued with cough shortness of breath with exertion no pain good appetite drinking well but dyspneic with any exertion dizzy lightheaded with exertion as well. Presents here  She has a one half pack per day smoker  Sure depression. No significant medical illness. No history of hypertension diabetes heart disease lung disease. Did not receive a flu vaccine. Has not had Prevnar /Pneumovax.    Past Medical History  Diagnosis Date  . Anxiety    Past Surgical History  Procedure Laterality Date  . Open reduction left ankle    . Abdominal hysterectomy  2011    Laparoscopic  . Cholecystectomy     Family History  Problem Relation Age of Onset  . Heart disease Mother   . Heart disease Father   . Cancer Sister     ovarian ca   Social History  Substance Use Topics  . Smoking status: Never Smoker   . Smokeless tobacco: None  . Alcohol Use: No   OB History    No data available     Review of Systems  Constitutional: Positive for fever. Negative for chills, diaphoresis, appetite change and fatigue.  HENT: Negative for mouth sores, sore throat and trouble swallowing.   Eyes: Negative for visual disturbance.  Respiratory: Positive for cough and shortness of breath. Negative for chest  tightness and wheezing.        Dyspnea on exertion  Cardiovascular: Negative for chest pain.  Gastrointestinal: Positive for nausea and vomiting. Negative for abdominal pain, diarrhea and abdominal distention.  Endocrine: Negative for polydipsia, polyphagia and polyuria.  Genitourinary: Negative for dysuria, frequency and hematuria.  Musculoskeletal: Negative for gait problem.  Skin: Negative for color change, pallor and rash.  Neurological: Positive for weakness. Negative for dizziness, syncope, light-headedness and headaches.  Hematological: Does not bruise/bleed easily.  Psychiatric/Behavioral: Negative for behavioral problems and confusion.      Allergies  Review of patient's allergies indicates no known allergies.  Home Medications   Prior to Admission medications   Medication Sig Start Date End Date Taking? Authorizing Provider  acetaminophen (TYLENOL) 325 MG tablet Take 650 mg by mouth every 6 (six) hours as needed for moderate pain.    Yes Historical Provider, MD  busPIRone (BUSPAR) 30 MG tablet Take 15 mg by mouth 2 (two) times daily. 01/09/15  Yes Historical Provider, MD  guaiFENesin (MUCINEX) 600 MG 12 hr tablet Take 1,200 mg by mouth 2 (two) times daily as needed for cough or to loosen phlegm.    Yes Historical Provider, MD  ibuprofen (ADVIL,MOTRIN) 200 MG tablet Take 800 mg by mouth every 6 (six) hours as needed for moderate pain.    Yes Historical Provider, MD  Multiple Vitamins-Minerals (EMERGEN-C IMMUNE PLUS)  PACK Take 1 Package by mouth daily as needed (for immune).    Yes Historical Provider, MD  PARoxetine (PAXIL) 40 MG tablet Take 40 mg by mouth every morning. 01/09/15  Yes Historical Provider, MD  Pseudoeph-Doxylamine-DM-APAP (TYLENOL COLD/FLU SEVERE NIGHT PO) Take 30 mLs by mouth daily as needed (for cold and sleep).    Yes Historical Provider, MD   BP 104/69 mmHg  Pulse 89  Temp(Src) 98 F (36.7 C) (Oral)  Resp 18  Ht 5\' 9"  (1.753 m)  Wt 233 lb 9.6 oz (105.96  kg)  BMI 34.48 kg/m2  SpO2 94% Physical Exam  Constitutional: She is oriented to person, place, and time. She appears well-developed and well-nourished. No distress.  HENT:  Head: Normocephalic.  Eyes: Conjunctivae are normal. Pupils are equal, round, and reactive to light. No scleral icterus.  Neck: Normal range of motion. Neck supple. No thyromegaly present.  Cardiovascular: Normal rate and regular rhythm.  Exam reveals no gallop and no friction rub.   No murmur heard. Pulmonary/Chest: Effort normal. No respiratory distress. She has decreased breath sounds in the right middle field and the right lower field. She has no wheezes. She has rales in the right middle field and the right lower field.  Also the entire right lung base.  Abdominal: Soft. Bowel sounds are normal. She exhibits no distension. There is no tenderness. There is no rebound.  Musculoskeletal: Normal range of motion.  Neurological: She is alert and oriented to person, place, and time.  Skin: Skin is warm and dry. No rash noted.  Psychiatric: She has a normal mood and affect. Her behavior is normal.    ED Course  Procedures (including critical care time) Labs Review Labs Reviewed  COMPREHENSIVE METABOLIC PANEL - Abnormal; Notable for the following:    Potassium 3.0 (*)    Chloride 98 (*)    CO2 20 (*)    Glucose, Bld 129 (*)    Albumin 2.4 (*)    AST 60 (*)    Alkaline Phosphatase 298 (*)    Anion gap 21 (*)    All other components within normal limits  CBC - Abnormal; Notable for the following:    WBC 18.2 (*)    All other components within normal limits  URINALYSIS, ROUTINE W REFLEX MICROSCOPIC (NOT AT [facility]) - Abnormal; Notable for the following:    Color, Urine ORANGE (*)    APPearance CLOUDY (*)    Bilirubin Urine SMALL (*)    Ketones, ur 15 (*)    Protein, ur 100 (*)    Leukocytes, UA TRACE (*)    All other components within normal limits  URINE MICROSCOPIC-ADD ON - Abnormal; Notable for the  following:    Squamous Epithelial / LPF 6-30 (*)    Bacteria, UA FEW (*)    Casts HYALINE CASTS (*)    All other components within normal limits  I-STAT CG4 LACTIC ACID, ED - Abnormal; Notable for the following:    Lactic Acid, Venous 3.56 (*)    All other components within normal limits  CULTURE, BLOOD (ROUTINE X 2)  CULTURE, BLOOD (ROUTINE X 2)  URINE CULTURE  LIPASE, BLOOD  INFLUENZA PANEL BY PCR (TYPE A & B, H1N1)    Imaging Review Dg Chest 2 View  12/27/2015  CLINICAL DATA:  Shortness of breath, fever for 1 month. Shortness breath worsening over the past 2 days. Weakness. EXAM: CHEST  2 VIEW COMPARISON:  01/18/2013 FINDINGS: Consolidation noted in the right lower lobe  compatible with pneumonia. No confluent opacity on the left. Heart is mildly enlarged. No effusions or acute bony abnormality. IMPRESSION: Right lower lobe pneumonia. Electronically Signed   By: Rolm Baptise M.D.   On: 12/27/2015 12:00   I have personally reviewed and evaluated these images and lab results as part of my medical decision-making.   EKG Interpretation   Date/Time:  Wednesday December 27 2015 11:38:55 EST Ventricular Rate:  102 PR Interval:  160 QRS Duration: 88 QT Interval:  402 QTC Calculation: 523 R Axis:   50 Text Interpretation:  Sinus tachycardia with Premature atrial complexes  Nonspecific ST abnormality Prolonged QT Abnormal ECG Confirmed by Jeneen Rinks   MD, [place] ([id number]) on 12/27/2015 3:28:53 PM      MDM   Final diagnoses:  CAP (community acquired pneumonia)  Sepsis, due to unspecified organism [facility])    Patient is not febrile. His hypoxemic upon arrival to triage. Is not hypoxemic sitting in bed.  Has elevated lactate. Has extensive right lower lobe infiltrate.  Has no contraindications to IV fluid bolusing. I've written for 30 mL/kg of IV fluids. Given Rocephin and Zithromax. Continues to look well clinically.  Her story is consistent with the possibility of an influenza with a few days in  the interval with mild improvement of symptoms before worsening. Consideration will be given for a post-influenza pneumonia including staph. We'll discuss with admitting physician    Tanna Furry, MD 12/27/15 1552  Tanna Furry, MD 12/27/15 [place], MD 12/27/15 [phone]

## 2015-12-27 NOTE — ED Notes (Signed)
Dr Jeneen Rinks given a copy of lactic acid results 3.56

## 2015-12-27 NOTE — H&P (Signed)
[facility] History and Physical  [name] [id number] DOB: 1956/08/02 DOA: 12/27/2015  Referring physician: Jeneen Hays PCP: Debra Noon, MD   Chief Complaint: cough/generalized weakness/sob  HPI: [name] is a very pleasant 60 y.o. female with a past medical history that includes anxiety/depression presents to the emergency department from her primary care provider's office with the chief complaint of hypoxia.  Information is obtained from the patient. In days ago developed general body aches joint pain and myalgia. 2 days later she developed fever and slept. She reports feeling better the next day but the day after that fever bodyaches reoccurred. Associated symptoms also include cough chills subjective fever, nausea and one episode of emesis. She denies any coffee ground emesis. By day 4 she felt like she was improved her cough lingered. She then developed shortness of breath with exertion. She denies chest pain palpitation headache dizziness syncope or near-syncope. She denies abdominal pain decreased oral intake lower extremity edema orthopnea diarrhea melena bright red blood per rectum. His report being a smoker  Emergency department max temp 99 hemodynamically stable with hypoxia 89% on room air. Emergency department she is provided with 3 L of normal saline Rocephin and azithromycin as well as oxygen supplementation.   Review of Systems:  Point review of systems complete and all systems are negative except as indicated in the history of present illness  Past Medical History  Diagnosis Date  . Anxiety   . CAP (community acquired pneumonia)   . Depression   . Tobacco use    Past Surgical History  Procedure Laterality Date  . Open reduction left ankle    . Abdominal hysterectomy  2011    Laparoscopic  . Cholecystectomy     Social History:  reports that she has never smoked. She does not have any smokeless tobacco history on file. She reports that she does  not drink alcohol or use illicit drugs. She is married she lives at home with her husband. 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 Heart disease Mother   . Heart disease Father   . Cancer Sister     ovarian ca     Prior to Admission medications   Medication Sig Start Date End Date Taking? Authorizing Provider  acetaminophen (TYLENOL) 325 MG tablet Take 650 mg by mouth every 6 (six) hours as needed for moderate pain.    Yes Historical Provider, MD  busPIRone (BUSPAR) 30 MG tablet Take 15 mg by mouth 2 (two) times daily. 01/09/15  Yes Historical Provider, MD  guaiFENesin (MUCINEX) 600 MG 12 hr tablet Take 1,200 mg by mouth 2 (two) times daily as needed for cough or to loosen phlegm.    Yes Historical Provider, MD  ibuprofen (ADVIL,MOTRIN) 200 MG tablet Take 800 mg by mouth every 6 (six) hours as needed for moderate pain.    Yes Historical Provider, MD  Multiple Vitamins-Minerals (EMERGEN-C IMMUNE PLUS) PACK Take 1 Package by mouth daily as needed (for immune).    Yes Historical Provider, MD  PARoxetine (PAXIL) 40 MG tablet Take 40 mg by mouth every morning. 01/09/15  Yes Historical Provider, MD  Pseudoeph-Doxylamine-DM-APAP (TYLENOL COLD/FLU SEVERE NIGHT PO) Take 30 mLs by mouth daily as needed (for cold and sleep).    Yes Historical Provider, MD   Physical Exam: Filed Vitals:   12/27/15 1545 12/27/15 1614 12/27/15 1615 12/27/15 1630  BP: 128/67 140/58 115/60 143/64  Pulse: 88 91 92 96  Temp:      TempSrc:      Resp: 18 16  20   Height:      Weight:      SpO2: 95% 96% 94% 96%    Wt Readings from Last 3 Encounters:  12/27/15 105.96 kg (233 lb 9.6 oz)  02/18/13 88.179 kg (194 lb 6.4 oz)  01/08/13 86.637 kg (191 lb)    General:  Appears calm and comfortable Eyes: PERRL, normal lids, irises & conjunctiva ENT: grossly normal hearing, lips & tongue Is membranes of her mouth are slightly dry but  pink Neck: no LAD, masses or thyromegaly Cardiovascular: RRR, no m/r/g. No LE edema. Pulses present and palpable Respiratory: Increased work of breathing with conversation. Breath sounds diminished right base coarse with rhonchi fine crackles no wheeze Abdomen: soft, ntnd + bowel sounds throughout no guarding Skin: no rash or induration seen on limited exam Musculoskeletal: grossly normal tone BUE/BLE joints without swelling/erythema Psychiatric: grossly normal mood and affect, speech fluent and appropriate Neurologic: grossly non-focal. Cranial nerves II through XII grossly intact           Labs on Admission:  Basic Metabolic Panel:  Recent Labs Lab 12/27/15 1140  NA 139  K 3.0*  CL 98*  CO2 20*  GLUCOSE 129*  BUN 16  CREATININE 0.98  CALCIUM 9.4   Liver Function Tests:  Recent Labs Lab 12/27/15 1140  AST 60*  ALT 40  ALKPHOS 298*  BILITOT 1.1  PROT 7.2  ALBUMIN 2.4*    Recent Labs Lab 12/27/15 1140  LIPASE 17   No results for input(s): AMMONIA in the last 168 hours. CBC:  Recent Labs Lab 12/27/15 1140  WBC 18.2*  HGB 13.2  HCT 39.2  MCV 88.5  PLT 270   Cardiac Enzymes: No results for input(s): CKTOTAL, CKMB, CKMBINDEX, TROPONINI in the last 168 hours.  BNP (last 3 results) No results for input(s): BNP in the last 8760 hours.  ProBNP (last 3 results) No results for input(s): PROBNP in the last 8760 hours.  CBG: No results for input(s): GLUCAP in the last 168 hours.  Radiological Exams on Admission: Dg Chest 2 View  12/27/2015  CLINICAL DATA:  Shortness of breath, fever for 1 month. Shortness breath worsening over the past 2 days. Weakness. EXAM: CHEST  2 VIEW COMPARISON:  01/18/2013 FINDINGS: Consolidation noted in the right lower lobe compatible with pneumonia. No confluent opacity on the left. Heart is mildly enlarged. No effusions or acute bony abnormality. IMPRESSION: Right lower lobe pneumonia. Electronically Signed   By: Rolm Baptise M.D.    On: 12/27/2015 12:00    EKG: Independently reviewed. Sinus tachycardia with Premature atrial complexes Nonspecific ST abnormality Prolonged QT Abnormal ECG  Assessment/Plan Principal Problem:   Acute respiratory failure with hypoxia (HCC) Active Problems:   CAP (community acquired pneumonia)   Elevated lactic acid level   Hypokalemia   Leukocytosis   Tobacco abuse  1. Acute risk for failure with hypoxia likely  to community-acquired pneumonia setting of ongoing smoker meets criteria for sepsis. Fever, tachypnea, lactic acidosis.  Oxygen saturation level 88% on room air in the emergency department. Chest x-ray with consolidation in right lower lobe with mildly enlarged heart. -Admit to telemetry -Oxygen supplementation -Rocephin and azithromycin -Blood cultures -Influenza panel -sputum cultures -Nebulizers every 6 hours -strep pneumo urine antigen -legionella urine antigen -obtain procalcitonin -IV fluids -track lactic acid.   #2. Community-acquired pneumonia. See above -Sputum culture -Blood  culture -Rocephin and azithromycin -IV fluids  #3. Hypokalemia. Potassium 3.0 on admission. provided with 2 runs of potassium IV in the emergency department -We'll obtain a magnesium level -We will also give 40 mEq by mouth -We'll recheck in the morning  #4. Elevated lactic acid. Lactic acid 3.56. No tachycardia no hypotension temperature 99.1 -Continue IV fluids -Repeat this evening -Monitor  #5. Tobacco use. He is smoked for decades -Cessation counseling offered    Code Status: full DVT Prophylaxis: Family Communication: husband Disposition Plan: home hopefully 36 hours  Time spent: 76 minutes  [facility]

## 2015-12-27 NOTE — ED Notes (Signed)
Pt reports being diagnosed with the flu recently. Pt reports continued weakness, N/V, cough, congestion and fevers.

## 2015-12-27 NOTE — Progress Notes (Signed)
Pharmacy Code Sepsis Protocol  Time of code sepsis page: 1530 []  Antibiotics delivered at 1540  Were antibiotics ordered at the time of the code sepsis page? Yes Was it required to contact the physician? [x]  Physician not contacted []  Physician contacted to order antibiotics for code sepsis []  Physician contacted to recommend changing antibiotics  Anti-infectives    Start     Dose/Rate Route Frequency Ordered Stop   12/27/15 1500  cefTRIAXone (ROCEPHIN) 1 g in dextrose 5 % 50 mL IVPB     1 g 100 mL/hr over 30 Minutes Intravenous  Once 12/27/15 1451     12/27/15 1500  azithromycin (ZITHROMAX) 500 mg in dextrose 5 % 250 mL IVPB     500 mg 250 mL/hr over 60 Minutes Intravenous  Once 12/27/15 1451          Nurse education provided: [x]  Minutes left to administer antibiotics to achieve 1 hour goal [x]  Correct order of antibiotic administration [x]  Antibiotic Y-site compatibilities    Janus Vlcek C. Lennox Grumbles, PharmD Pharmacy Resident  Pager: [phone] 12/27/2015 3:37 PM

## 2015-12-27 NOTE — Progress Notes (Signed)
MEDICATION RELATED CONSULT NOTE - INITIAL   Pharmacy Consult for Antibiotic renal dosing adjustment  No Known Allergies  Patient Measurements: Height: 5\' 9"  (175.3 cm) Weight: 233 lb 9.6 oz (105.96 kg) IBW/kg (Calculated) : 66.2  Vital Signs: Temp: 98 F (36.7 C) (03/08 1123) Temp Source: Oral (03/08 1123) BP: 140/58 mmHg (03/08 1614) Pulse Rate: 91 (03/08 1614) Intake/Output from previous day:   Intake/Output from this shift:    Labs:  Recent Labs  12/27/15 1140  WBC 18.2*  HGB 13.2  HCT 39.2  PLT 270  CREATININE 0.98  ALBUMIN 2.4*  PROT 7.2  AST 60*  ALT 40  ALKPHOS 298*  BILITOT 1.1   Estimated Creatinine Clearance: 81.1 mL/min (by C-G formula based on Cr of 0.98).   Microbiology: No results found for this or any previous visit (from the past 720 hour(s)).  Medical History: Past Medical History  Diagnosis Date  . Anxiety   . CAP (community acquired pneumonia)   . Depression     Assessment: 48 yof presenting with pneumonia and receiving treatment with azithromycin and ceftriaxone.  Pharmacy consulted to renally adjust antibiotics. WBC 18.2, CrCl ~57mL/min  Plan:  Continue azithromycin 500mg  IV Q24h  Continue ceftriaxone 1g IV Q24h   Mckenlee Mangham C. Lennox Grumbles, PharmD Pharmacy Resident  Pager: [phone] 12/27/2015 5:36 PM

## 2015-12-28 ENCOUNTER — Observation Stay (HOSPITAL_COMMUNITY): Payer: BLUE CROSS/BLUE SHIELD

## 2015-12-28 ENCOUNTER — Inpatient Hospital Stay (HOSPITAL_COMMUNITY): Payer: BLUE CROSS/BLUE SHIELD

## 2015-12-28 DIAGNOSIS — D6489 Other specified anemias: Secondary | ICD-10-CM | POA: Diagnosis present

## 2015-12-28 DIAGNOSIS — E872 Acidosis: Secondary | ICD-10-CM

## 2015-12-28 DIAGNOSIS — R6521 Severe sepsis with septic shock: Secondary | ICD-10-CM | POA: Diagnosis present

## 2015-12-28 DIAGNOSIS — T380X5A Adverse effect of glucocorticoids and synthetic analogues, initial encounter: Secondary | ICD-10-CM | POA: Diagnosis present

## 2015-12-28 DIAGNOSIS — R05 Cough: Secondary | ICD-10-CM | POA: Diagnosis present

## 2015-12-28 DIAGNOSIS — A419 Sepsis, unspecified organism: Secondary | ICD-10-CM | POA: Diagnosis present

## 2015-12-28 DIAGNOSIS — E876 Hypokalemia: Secondary | ICD-10-CM | POA: Diagnosis present

## 2015-12-28 DIAGNOSIS — G9341 Metabolic encephalopathy: Secondary | ICD-10-CM | POA: Diagnosis present

## 2015-12-28 DIAGNOSIS — F329 Major depressive disorder, single episode, unspecified: Secondary | ICD-10-CM | POA: Diagnosis present

## 2015-12-28 DIAGNOSIS — J80 Acute respiratory distress syndrome: Secondary | ICD-10-CM

## 2015-12-28 DIAGNOSIS — R739 Hyperglycemia, unspecified: Secondary | ICD-10-CM | POA: Diagnosis present

## 2015-12-28 DIAGNOSIS — J9601 Acute respiratory failure with hypoxia: Secondary | ICD-10-CM | POA: Diagnosis not present

## 2015-12-28 DIAGNOSIS — Z23 Encounter for immunization: Secondary | ICD-10-CM | POA: Diagnosis not present

## 2015-12-28 DIAGNOSIS — E870 Hyperosmolality and hypernatremia: Secondary | ICD-10-CM | POA: Diagnosis present

## 2015-12-28 DIAGNOSIS — F17210 Nicotine dependence, cigarettes, uncomplicated: Secondary | ICD-10-CM | POA: Diagnosis present

## 2015-12-28 DIAGNOSIS — F419 Anxiety disorder, unspecified: Secondary | ICD-10-CM | POA: Diagnosis present

## 2015-12-28 DIAGNOSIS — J189 Pneumonia, unspecified organism: Secondary | ICD-10-CM | POA: Diagnosis present

## 2015-12-28 DIAGNOSIS — K5900 Constipation, unspecified: Secondary | ICD-10-CM | POA: Diagnosis not present

## 2015-12-28 DIAGNOSIS — D72829 Elevated white blood cell count, unspecified: Secondary | ICD-10-CM

## 2015-12-28 DIAGNOSIS — R0600 Dyspnea, unspecified: Secondary | ICD-10-CM | POA: Diagnosis not present

## 2015-12-28 LAB — CBC
HEMATOCRIT: 32.5 % — AB (ref 36.0–46.0)
HEMOGLOBIN: 10.8 g/dL — AB (ref 12.0–15.0)
MCH: 29.5 pg (ref 26.0–34.0)
MCHC: 33.2 g/dL (ref 30.0–36.0)
MCV: 88.8 fL (ref 78.0–100.0)
Platelets: 254 10*3/uL (ref 150–400)
RBC: 3.66 MIL/uL — ABNORMAL LOW (ref 3.87–5.11)
RDW: 15 % (ref 11.5–15.5)
WBC: 19 10*3/uL — ABNORMAL HIGH (ref 4.0–10.5)

## 2015-12-28 LAB — BASIC METABOLIC PANEL
ANION GAP: 11 (ref 5–15)
BUN: 12 mg/dL (ref 6–20)
CALCIUM: 7.8 mg/dL — AB (ref 8.9–10.3)
CO2: 20 mmol/L — AB (ref 22–32)
Chloride: 104 mmol/L (ref 101–111)
Creatinine, Ser: 0.63 mg/dL (ref 0.44–1.00)
GFR calc Af Amer: >60 mL/min (ref >60)
GLUCOSE: 141 mg/dL — AB (ref 65–99)
POTASSIUM: 3.3 mmol/L — AB (ref 3.5–5.1)
Sodium: 135 mmol/L (ref 135–145)

## 2015-12-28 LAB — BLOOD GAS, ARTERIAL
ACID-BASE DEFICIT: 1.5 mmol/L (ref 0.0–2.0)
ACID-BASE EXCESS: 1.2 mmol/L (ref 0.0–2.0)
BICARBONATE: 23.9 meq/L (ref 20.0–24.0)
Bicarbonate: 26.7 mEq/L — ABNORMAL HIGH (ref 20.0–24.0)
DRAWN BY: 246101
DRAWN BY: 398661
FIO2: 1
FIO2: 1
LHR: 27 {breaths}/min
MECHVT: 400 mL
O2 Saturation: 95.9 %
O2 Saturation: 86.6 %
PATIENT TEMPERATURE: 98.6
PEEP/CPAP: 12 cmH2O
PH ART: 7.309 — AB (ref 7.350–7.450)
PO2 ART: 57.9 mmHg — AB (ref 80.0–100.0)
Patient temperature: 98.6
TCO2: 25.4 mmol/L (ref 0–100)
TCO2: 28.3 mmol/L (ref 0–100)
pCO2 arterial: 48.9 mmHg — ABNORMAL HIGH (ref 35.0–45.0)
pCO2 arterial: 53.5 mmHg — ABNORMAL HIGH (ref 35.0–45.0)
pH, Arterial: 7.318 — ABNORMAL LOW (ref 7.350–7.450)
pO2, Arterial: 92.5 mmHg (ref 80.0–100.0)

## 2015-12-28 LAB — MAGNESIUM: MAGNESIUM: 1.8 mg/dL (ref 1.7–2.4)

## 2015-12-28 LAB — LACTIC ACID, PLASMA
LACTIC ACID, VENOUS: 1.5 mmol/L (ref 0.5–2.0)
Lactic Acid, Venous: 1.3 mmol/L (ref 0.5–2.0)

## 2015-12-28 LAB — BRAIN NATRIURETIC PEPTIDE: B NATRIURETIC PEPTIDE 5: 241 pg/mL — AB (ref 0.0–100.0)

## 2015-12-28 LAB — STREP PNEUMONIAE URINARY ANTIGEN: STREP PNEUMO URINARY ANTIGEN: NEGATIVE

## 2015-12-28 LAB — HIV ANTIBODY (ROUTINE TESTING W REFLEX): HIV Screen 4th Generation wRfx: NONREACTIVE

## 2015-12-28 MED ORDER — SODIUM CHLORIDE 0.9 % IV SOLN
25.0000 ug/h | INTRAVENOUS | Status: DC
  Administered 2015-12-28: 25 ug/h via INTRAVENOUS
  Administered 2015-12-30 (×2): 125 ug/h via INTRAVENOUS
  Administered 2015-12-31 – 2016-01-02 (×3): 175 ug/h via INTRAVENOUS
  Administered 2016-01-03: 50 ug/h via INTRAVENOUS
  Filled 2015-12-28 (×9): qty 50

## 2015-12-28 MED ORDER — FUROSEMIDE 10 MG/ML IJ SOLN: 20.0000 mg | Freq: Once | INTRAMUSCULAR | Status: DC

## 2015-12-28 MED ORDER — DEXTROSE 5 % IV SOLN
500.0000 mg | INTRAVENOUS | Status: DC
  Administered 2015-12-29 – 2016-01-06 (×9): 500 mg via INTRAVENOUS
  Filled 2015-12-28 (×11): qty 500

## 2015-12-28 MED ORDER — ETOMIDATE 2 MG/ML IV SOLN
20.0000 mg/kg | Freq: Once | INTRAVENOUS | Status: AC
  Administered 2015-12-28: 20 mg via INTRAVENOUS

## 2015-12-28 MED ORDER — MIDAZOLAM HCL 2 MG/2ML IJ SOLN
INTRAMUSCULAR | Status: AC
  Filled 2015-12-28: qty 4

## 2015-12-28 MED ORDER — PANTOPRAZOLE SODIUM 40 MG IV SOLR
40.0000 mg | INTRAVENOUS | Status: DC
  Administered 2015-12-28 – 2015-12-29 (×2): 40 mg via INTRAVENOUS
  Filled 2015-12-28 (×2): qty 40

## 2015-12-28 MED ORDER — SODIUM CHLORIDE 0.9 % IV SOLN
INTRAVENOUS | Status: DC
  Administered 2016-01-01: 04:00:00 via INTRAVENOUS

## 2015-12-28 MED ORDER — FUROSEMIDE 10 MG/ML IJ SOLN
20.0000 mg | Freq: Once | INTRAMUSCULAR | Status: AC
  Administered 2015-12-28: 20 mg via INTRAVENOUS
  Filled 2015-12-28: qty 2

## 2015-12-28 MED ORDER — METHYLPREDNISOLONE SODIUM SUCC 125 MG IJ SOLR
60.0000 mg | Freq: Four times a day (QID) | INTRAMUSCULAR | Status: DC
  Administered 2015-12-28 – 2015-12-29 (×3): 60 mg via INTRAVENOUS
  Filled 2015-12-28 (×3): qty 2

## 2015-12-28 MED ORDER — BUDESONIDE 0.5 MG/2ML IN SUSP
0.5000 mg | Freq: Two times a day (BID) | RESPIRATORY_TRACT | Status: DC
  Administered 2015-12-29 (×2): 0.5 mg via RESPIRATORY_TRACT
  Filled 2015-12-28 (×2): qty 2

## 2015-12-28 MED ORDER — SUCCINYLCHOLINE CHLORIDE 20 MG/ML IJ SOLN
50.0000 mg | Freq: Once | INTRAMUSCULAR | Status: AC
  Filled 2015-12-28: qty 2.5

## 2015-12-28 MED ORDER — FENTANYL CITRATE (PF) 100 MCG/2ML IJ SOLN
INTRAMUSCULAR | Status: AC
  Filled 2015-12-28: qty 2

## 2015-12-28 MED ORDER — PHENYLEPHRINE HCL 10 MG/ML IJ SOLN
0.0000 ug/min | INTRAVENOUS | Status: DC
  Administered 2015-12-28 – 2015-12-29 (×2): 10 ug/min via INTRAVENOUS
  Administered 2015-12-29 (×2): 45 ug/min via INTRAVENOUS
  Administered 2015-12-29: 50 ug/min via INTRAVENOUS
  Administered 2015-12-29: 40 ug/min via INTRAVENOUS
  Administered 2015-12-29: 50 ug/min via INTRAVENOUS
  Administered 2015-12-30: 20 ug/min via INTRAVENOUS
  Administered 2015-12-30: 10 ug/min via INTRAVENOUS
  Administered 2015-12-30: 15 ug/min via INTRAVENOUS
  Administered 2015-12-31: 25 ug/min via INTRAVENOUS
  Filled 2015-12-28 (×13): qty 1

## 2015-12-28 MED ORDER — OSELTAMIVIR PHOSPHATE 6 MG/ML PO SUSR
75.0000 mg | Freq: Two times a day (BID) | ORAL | Status: DC
  Administered 2015-12-28 – 2016-01-01 (×9): 75 mg via ORAL
  Filled 2015-12-28 (×11): qty 12.5

## 2015-12-28 MED ORDER — VANCOMYCIN HCL IN DEXTROSE 1-5 GM/200ML-% IV SOLN
1000.0000 mg | Freq: Two times a day (BID) | INTRAVENOUS | Status: DC
  Administered 2015-12-28: 1000 mg via INTRAVENOUS
  Filled 2015-12-28 (×2): qty 200

## 2015-12-28 MED ORDER — PROPOFOL 1000 MG/100ML IV EMUL
0.0000 ug/kg/min | INTRAVENOUS | Status: DC
  Administered 2015-12-28: 20 ug/kg/min via INTRAVENOUS
  Administered 2015-12-29: 40 ug/kg/min via INTRAVENOUS
  Administered 2015-12-29 (×2): 50 ug/kg/min via INTRAVENOUS
  Administered 2015-12-29: 45 ug/kg/min via INTRAVENOUS
  Administered 2015-12-29: 30 ug/kg/min via INTRAVENOUS
  Administered 2015-12-29: 20 ug/kg/min via INTRAVENOUS
  Administered 2015-12-30: 50 ug/kg/min via INTRAVENOUS
  Administered 2015-12-30 – 2015-12-31 (×7): 45 ug/kg/min via INTRAVENOUS
  Administered 2015-12-31 (×2): 50 ug/kg/min via INTRAVENOUS
  Filled 2015-12-28 (×15): qty 100

## 2015-12-28 MED ORDER — DEXTROSE 5 % IV SOLN
1.0000 g | INTRAVENOUS | Status: AC
  Administered 2015-12-28 – 2016-01-06 (×10): 1 g via INTRAVENOUS
  Filled 2015-12-28 (×11): qty 10

## 2015-12-28 MED ORDER — FENTANYL CITRATE (PF) 100 MCG/2ML IJ SOLN
200.0000 ug | Freq: Once | INTRAMUSCULAR | Status: AC
  Administered 2015-12-28: 200 ug via INTRAVENOUS

## 2015-12-28 MED ORDER — ARTIFICIAL TEARS OP OINT
1.0000 "application " | TOPICAL_OINTMENT | Freq: Three times a day (TID) | OPHTHALMIC | Status: DC
  Administered 2015-12-28 – 2015-12-31 (×7): 1 via OPHTHALMIC
  Filled 2015-12-28: qty 3.5

## 2015-12-28 MED ORDER — FENTANYL BOLUS VIA INFUSION
50.0000 ug | INTRAVENOUS | Status: DC | PRN
  Filled 2015-12-28: qty 50

## 2015-12-28 MED ORDER — MORPHINE SULFATE (PF) 2 MG/ML IV SOLN: 1.0000 mg | INTRAVENOUS | Status: DC | PRN

## 2015-12-28 MED ORDER — FENTANYL CITRATE (PF) 100 MCG/2ML IJ SOLN: 100.0000 ug | Freq: Once | INTRAMUSCULAR | Status: DC | PRN

## 2015-12-28 MED ORDER — PROPOFOL 1000 MG/100ML IV EMUL
INTRAVENOUS | Status: AC
  Filled 2015-12-28: qty 100

## 2015-12-28 MED ORDER — CHLORHEXIDINE GLUCONATE 0.12 % MT SOLN
15.0000 mL | Freq: Once | OROMUCOSAL | Status: AC
  Administered 2015-12-28: 15 mL via OROMUCOSAL

## 2015-12-28 MED ORDER — LORAZEPAM 0.5 MG PO TABS
0.5000 mg | ORAL_TABLET | Freq: Two times a day (BID) | ORAL | Status: DC | PRN
  Administered 2015-12-28: 0.5 mg via ORAL
  Filled 2015-12-28: qty 1

## 2015-12-28 MED ORDER — ANTISEPTIC ORAL RINSE SOLUTION (CORINZ)
7.0000 mL | Freq: Four times a day (QID) | OROMUCOSAL | Status: DC
  Administered 2015-12-28 – 2016-01-05 (×31): 7 mL via OROMUCOSAL

## 2015-12-28 MED ORDER — MAGNESIUM SULFATE 50 % IJ SOLN
2.0000 g | Freq: Once | INTRAVENOUS | Status: AC
  Administered 2015-12-28: 2 g via INTRAVENOUS
  Filled 2015-12-28: qty 4

## 2015-12-28 MED ORDER — MORPHINE SULFATE (PF) 2 MG/ML IV SOLN
1.0000 mg | Freq: Once | INTRAVENOUS | Status: AC
  Administered 2015-12-28: 1 mg via INTRAVENOUS

## 2015-12-28 MED ORDER — DOCUSATE SODIUM 50 MG/5ML PO LIQD
100.0000 mg | Freq: Two times a day (BID) | ORAL | Status: DC | PRN
  Filled 2015-12-28: qty 10

## 2015-12-28 MED ORDER — POTASSIUM CHLORIDE CRYS ER 20 MEQ PO TBCR
40.0000 meq | EXTENDED_RELEASE_TABLET | Freq: Once | ORAL | Status: AC
  Administered 2015-12-28: 40 meq via ORAL
  Filled 2015-12-28: qty 2

## 2015-12-28 MED ORDER — DEXTROSE 5 % IV SOLN
1.0000 g | Freq: Three times a day (TID) | INTRAVENOUS | Status: DC
  Administered 2015-12-28: 1 g via INTRAVENOUS
  Filled 2015-12-28 (×3): qty 1

## 2015-12-28 MED ORDER — IPRATROPIUM-ALBUTEROL 0.5-2.5 (3) MG/3ML IN SOLN
3.0000 mL | Freq: Four times a day (QID) | RESPIRATORY_TRACT | Status: DC
  Administered 2015-12-29 – 2016-01-04 (×28): 3 mL via RESPIRATORY_TRACT
  Filled 2015-12-28 (×27): qty 3

## 2015-12-28 MED ORDER — OSELTAMIVIR PHOSPHATE 75 MG PO CAPS
75.0000 mg | ORAL_CAPSULE | Freq: Two times a day (BID) | ORAL | Status: DC
  Filled 2015-12-28: qty 1

## 2015-12-28 MED ORDER — MIDAZOLAM HCL 2 MG/2ML IJ SOLN
2.0000 mg | Freq: Once | INTRAMUSCULAR | Status: AC
  Administered 2015-12-28: 2 mg via INTRAVENOUS

## 2015-12-28 MED ORDER — FENTANYL CITRATE (PF) 100 MCG/2ML IJ SOLN: 50.0000 ug | Freq: Once | INTRAMUSCULAR | Status: AC

## 2015-12-28 MED ORDER — METHYLPREDNISOLONE SODIUM SUCC 125 MG IJ SOLR
125.0000 mg | Freq: Once | INTRAMUSCULAR | Status: AC
  Administered 2015-12-28: 125 mg via INTRAVENOUS
  Filled 2015-12-28: qty 2

## 2015-12-28 MED ORDER — CHLORHEXIDINE GLUCONATE 0.12% ORAL RINSE (MEDLINE KIT)
15.0000 mL | Freq: Two times a day (BID) | OROMUCOSAL | Status: DC
  Administered 2015-12-28 – 2016-01-04 (×15): 15 mL via OROMUCOSAL

## 2015-12-28 NOTE — Procedures (Signed)
Arterial Catheter Insertion Procedure Note [name] [id number] 07/16/1956  Procedure: Insertion of Arterial Catheter  Indications: Blood pressure monitoring and Frequent blood sampling  Procedure Details Consent: Unable to obtain consent because of emergent medical necessity. Time Out: Verified patient identification, verified procedure, site/side was marked, verified correct patient position, special equipment/implants available, medications/allergies/relevent history reviewed, required imaging and test results available.  Performed  Maximum sterile technique was used including antiseptics, cap, gloves, gown, hand hygiene, mask and sheet. Skin prep: Chlorhexidine; local anesthetic administered 20 gauge catheter was inserted into right radial artery using the Seldinger technique.  Evaluation Blood flow good; BP tracing good. Complications: No apparent complications.+ RT placed arrow catheter on first attempt.   [name] 12/28/2015

## 2015-12-28 NOTE — Progress Notes (Addendum)
[facility]                                                                              Patient Demographics  [name], is a 60 y.o. female, DOB - January 19, 1956, [id number]  Admit date - 12/27/2015   Admitting Physician Waldemar Dickens, MD  Outpatient Primary MD for the patient is Chesley Noon, MD  LOS - 1   Chief Complaint  Patient presents with  . Cough       Brief HPI   Patient is a 60 year old female with anxiety, depression, tobacco use presented to ED with hypoxia. Patient reported that she had generalized body aches, joint pains, myalgias, fevers for last week. Patient also complained of shortness of breath with exertion, coughing. She took amoxicillin (leftover medication )last week with no significant improvement. Patient was found to have right lung pneumonia on the chest x-ray and was admitted for further workup.   Assessment & Plan    Principal Problem:   Acute respiratory failure with hypoxia (HCC)Secondary to extensive right lung consolidation, SIRS - Patient still spiking fever, requiring more oxygen on 4 L now. Recent travel history to [place] last month. - CT angiogram of the chest done, ruled out pulmonary embolism, no pleural effusions, however showed extensive consolidation throughout the right lung - Patient resented with lactic acidosis 2.3, pro-calcitonin 9.1, leukocytosis still trending up - Urine strep antigen negative, influenza panel negative, follow blood cultures - Placed on broad-spectrum IV vancomycin and cefepime (had taken amoxicillin last week) for HCAP Addendum: 4:40pm Increased work of breathing,sats 93% on 15LNRB, - stat ABG 7.31/49/93/23, ordered BNP - solumerdol 125mg  x1, then q6hrs, lasix 20mg  IV x1,  Mg 2gx1, morphine 1mg   - Rapid response called, place on BIPAP, transfer to SDU  - paged CCM    Active Problems:   Elevated lactic acid level - Patient received IV fluid hydration    Hypokalemia -  Replaced    Leukocytosis: Likely due to #1    Tobacco abuse - Patient counseled on smoking cessation  Prolonged QTC: 523 - Follow closely, avoid fluoroquinolones  Anxiety Continue Paxil, Ativan as needed   Code Status: Full code  Family Communication: Discussed in detail with the patient, all imaging results, lab results explained to the patient and husband at the bedside   Disposition Plan:   Time Spent in minutes   25 minutes  Procedures  CTA CHEST  Consults    none   DVT Prophylaxis  Lovenox  Medications  Scheduled Meds: . albuterol  2.5 mg Nebulization TID  . ceFEPime (MAXIPIME) IV  1 g Intravenous 3 times per day  . enoxaparin (LOVENOX) injection  40 mg Subcutaneous Q24H  . PARoxetine  40 mg Oral q morning - 10a  . sodium chloride flush  3 mL Intravenous Q12H  . vancomycin  1,000 mg Intravenous Q12H   Continuous Infusions:  PRN Meds:.acetaminophen **OR** acetaminophen, albuterol, alum & mag hydroxide-simeth, guaiFENesin, HYDROcodone-acetaminophen, morphine injection, ondansetron **OR** ondansetron (ZOFRAN) IV, senna-docusate, traZODone   Antibiotics   Anti-infectives    Start     Dose/Rate Route Frequency Ordered Stop   12/28/15 1700  azithromycin (ZITHROMAX) tablet 500 mg  Status:  Discontinued     500 mg Oral Every 24 hours 12/27/15 1957 12/28/15 1137   12/28/15 1630  cefTRIAXone (ROCEPHIN) 1 g in dextrose 5 % 50 mL IVPB  Status:  Discontinued     1 g 100 mL/hr over 30 Minutes Intravenous Every 24 hours 12/27/15 1956 12/28/15 1137   12/28/15 1300  ceFEPIme (MAXIPIME) 1 g in dextrose 5 % 50 mL IVPB     1 g 100 mL/hr over 30 Minutes Intravenous 3 times per day 12/28/15 1208     12/28/15 1230  vancomycin (VANCOCIN) IVPB 1000 mg/200 mL premix     1,000 mg 200 mL/hr over 60 Minutes Intravenous Every 12 hours 12/28/15 1208     12/27/15 1630  cefTRIAXone (ROCEPHIN) 1 g in dextrose 5 % 50 mL IVPB  Status:  Discontinued     1 g 100 mL/hr over 30 Minutes  Intravenous Every 24 hours 12/27/15 1615 12/27/15 1955   12/27/15 1630  azithromycin (ZITHROMAX) tablet 500 mg  Status:  Discontinued     500 mg Oral Every 24 hours 12/27/15 1615 12/27/15 1957   12/27/15 1500  cefTRIAXone (ROCEPHIN) 1 g in dextrose 5 % 50 mL IVPB     1 g 100 mL/hr over 30 Minutes Intravenous  Once 12/27/15 1451 12/27/15 1700   12/27/15 1500  azithromycin (ZITHROMAX) 500 mg in dextrose 5 % 250 mL IVPB     500 mg 250 mL/hr over 60 Minutes Intravenous  Once 12/27/15 1451 12/27/15 1736        Subjective:   [name] was seen and examined today. Complaining of headache, low-grade fever, shortness of breath, myalgias. Patient denies dizziness, chest pain, abdominal pain, N/V/D/C, new weakness, numbess, tingling. No acute events overnight.    Objective:   Filed Vitals:   12/28/15 0351 12/28/15 0432 12/28/15 0846 12/28/15 0912  BP:  147/55 140/64   Pulse:  96 96   Temp:  98.3 F (36.8 C) 99 F (37.2 C)   TempSrc:  Oral Oral   Resp:  18 18   Height:      Weight:      SpO2: 97% 94% 94% 92%    Intake/Output Summary (Last 24 hours) at 12/28/15 1236 Last data filed at 12/28/15 [id number]  Gross per 24 hour  Intake 1328.33 ml  Output    300 ml  Net 1028.33 ml     Wt Readings from Last 3 Encounters:  12/27/15 105.8 kg (233 lb 4 oz)  02/18/13 88.179 kg (194 lb 6.4 oz)  01/08/13 86.637 kg (191 lb)     Exam  General: Alert and oriented x 3, NAD, Feeling poorly  HEENT:  PERRLA, EOMI, Anicteric Sclera, mucous membranes moist.   Neck: Supple, no JVD, no masses  CVS: S1 S2 auscultated, no rubs, murmurs or gallops. Regular rate and rhythm.  Respiratory: Left-sided fairly clear, crackles up to the mid lung on the right side  Abdomen: Soft, nontender, nondistended, + bowel sounds  Ext: no cyanosis clubbing or edema  Neuro: AAOx3, Cr N's II- XII. Strength 5/5 upper and lower extremities bilaterally  Skin: No rashes  Psych: Normal affect and demeanor, alert  and oriented x3    Data Review   Micro Results No results found for this or any previous visit (from the past 240 hour(s)).  Radiology Reports Dg Chest 2 View  12/27/2015  CLINICAL DATA:  Shortness of breath, fever for 1 month. Shortness breath worsening  over the past 2 days. Weakness. EXAM: CHEST  2 VIEW COMPARISON:  01/18/2013 FINDINGS: Consolidation noted in the right lower lobe compatible with pneumonia. No confluent opacity on the left. Heart is mildly enlarged. No effusions or acute bony abnormality. IMPRESSION: Right lower lobe pneumonia. Electronically Signed   By: Rolm Baptise M.D.   On: 12/27/2015 12:00   Ct Angio Chest Pe W/cm &/or Wo Cm  12/28/2015  CLINICAL DATA:  Hypoxia , pneumonia EXAM: CT ANGIOGRAPHY CHEST WITH CONTRAST TECHNIQUE: Multidetector CT imaging of the chest was performed using the standard protocol during bolus administration of intravenous contrast. Multiplanar CT image reconstructions and MIPs were obtained to evaluate the vascular anatomy. CONTRAST:  80 cc Omnipaque 350 COMPARISON:  None. FINDINGS: Mediastinum/Lymph Nodes: There is no supraclavicular adenopathy. Right hilar lymph node measures 1.2 cm mild enlarged. There is borderline enlarged precarinal lymph node measures 1 cm. Sub- carinal lymph node measures 1 cm borderline enlarged. No left hilar adenopathy is noted. There is no aortic aneurysm. Heart size is within normal limits. No pulmonary embolus is noted. Central airways are patent. Mild pectus excavatum deformity. Central airways are patent. Images of the thoracic inlet are unremarkable. Lungs/Pleura: There is small left pleural effusion. There is extensive infiltrate/consolidation with air bronchogram in right lower lobe right perihilar region and right middle lobe. Findings are consistent with pneumonia. Bilateral upper lobe there is patchy parenchymal somewhat geographic airspace attenuation with some air bronchogram. Findings highly suspicious for evolving  pneumonia. Less likely ARDS or pulmonary edema. Upper abdomen: No adrenal gland mass is noted in visualized upper abdomen. The visualized spleen is unremarkable. The visualized liver is unremarkable. Musculoskeletal: No rib fractures are noted. Sagittal images of the spine shows mild degenerative changes lower thoracic spine. Review of the MIP images confirms the above findings. IMPRESSION: 1. There is no evidence of pulmonary embolus. Mild mediastinal and right hilar adenopathy probable reactive. 2. No aortic aneurysm. 3. Extensive infiltrate/consolidation in right lower lobe right perihilar and right middle lobe with air bronchogram highly suspicious for pneumonia. There is patchy geographic airspace opacification bilateral upper lobes. Findings highly suspicious for evolving pneumonia. Less likely ARDS or pulmonary edema. Clinical correlation is necessary. 4. Trace right pleural effusion and some posterior pleural thickening. 5. Mild pectus excavatum deformity in right midline lower chest anteriorly. 6. Mild degenerative changes lower thoracic spine. Electronically Signed   By: Lahoma Crocker M.D.   On: 12/28/2015 11:27    CBC  Recent Labs Lab 12/27/15 1140 12/28/15 0706  WBC 18.2* 19.0*  HGB 13.2 10.8*  HCT 39.2 32.5*  PLT 270 254  MCV 88.5 88.8  MCH 29.8 29.5  MCHC 33.7 33.2  RDW 14.8 15.0    Chemistries   Recent Labs Lab 12/27/15 1140 12/27/15 2108 12/28/15 0706  NA 139  --  135  K 3.0*  --  3.3*  CL 98*  --  104  CO2 20*  --  20*  GLUCOSE 129*  --  141*  BUN 16  --  12  CREATININE 0.98  --  0.63  CALCIUM 9.4  --  7.8*  MG  --  1.8  --   AST 60*  --   --   ALT 40  --   --   ALKPHOS 298*  --   --   BILITOT 1.1  --   --    ------------------------------------------------------------------------------------------------------------------ estimated creatinine clearance is 99.2 mL/min (by C-G formula based on Cr of  0.63). ------------------------------------------------------------------------------------------------------------------ No results for  input(s): HGBA1C in the last 72 hours. ------------------------------------------------------------------------------------------------------------------ No results for input(s): CHOL, HDL, LDLCALC, TRIG, CHOLHDL, LDLDIRECT in the last 72 hours. ------------------------------------------------------------------------------------------------------------------  Recent Labs  12/27/15 2024  TSH 1.194   ------------------------------------------------------------------------------------------------------------------ No results for input(s): VITAMINB12, FOLATE, FERRITIN, TIBC, IRON, RETICCTPCT in the last 72 hours.  Coagulation profile  Recent Labs Lab 12/27/15 2108  INR 1.32    No results for input(s): DDIMER in the last 72 hours.  Cardiac Enzymes No results for input(s): CKMB, TROPONINI, MYOGLOBIN in the last 168 hours.  Invalid input(s): CK ------------------------------------------------------------------------------------------------------------------ Invalid input(s): POCBNP  No results for input(s): GLUCAP in the last 72 hours.   RAI,RIPUDEEP M.D. [facility] 12/28/2015, 12:36 PM  Pager: [phone] Between 7am to 7pm - call Pager - [phone]  After 7pm go to [email or web address] - password [email or web address]  Call night coverage person covering after 7pm

## 2015-12-28 NOTE — Progress Notes (Signed)
Pharmacy Antibiotic Note  [name] is a 60 y.o. female admitted on 12/27/2015 with pneumonia.  Pharmacy has been consulted for vancomycin and cefepime dosing.  WBC = 19, Tm 99.  Scr 0.63, est CrCl ~ 95 ml/min.  Plan: 1. Vancomycin 1g IV q 12 hrs 2. Cefepime 1g IV q 8 hrs. 3. Will f/u cultures, renal function and clinical course.  Height: 5\' 9"  (175.3 cm) Weight: 233 lb 4 oz (105.8 kg) IBW/kg (Calculated) : 66.2  Temp (24hrs), Avg:99.3 F (37.4 C), Min:98.3 F (36.8 C), Max:101 F (38.3 C)   Recent Labs Lab 12/27/15 1140 12/27/15 1517 12/27/15 2024 12/28/15 0706  WBC 18.2*  --   --  19.0*  CREATININE 0.98  --   --  0.63  LATICACIDVEN  --  3.56* 2.3*  --     Estimated Creatinine Clearance: 99.2 mL/min (by C-G formula based on Cr of 0.63).    No Known Allergies  Antimicrobials this admission: Azithromycin 3/8 > Ceftriaxone 3/8 > Vanc 3/9> Cefepime 3/9 >  Dose adjustments this admission: n/a  Microbiology results: 3/8 BCx: 3/9 UCx:   Sputum:   MRSA PCR:   Thank you for allowing pharmacy to be a part of this patient's care.  Uvaldo Rising, BCPS  Clinical Pharmacist Pager [phone]  12/28/2015 12:06 PM

## 2015-12-28 NOTE — Procedures (Signed)
Central Venous Catheter Insertion Procedure Note [name] [id number] 1956-01-30  Procedure: Insertion of Central Venous Catheter Indications: Assessment of intravascular volume, Drug and/or fluid administration and Frequent blood sampling  Procedure Details Consent: Risks of procedure as well as the alternatives and risks of each were explained to the (patient/caregiver).  Consent for procedure obtained. Time Out: Verified patient identification, verified procedure, site/side was marked, verified correct patient position, special equipment/implants available, medications/allergies/relevent history reviewed, required imaging and test results available.  Performed  Maximum sterile technique was used including antiseptics, cap, gloves, gown, hand hygiene, mask and sheet. Skin prep: Chlorhexidine; local anesthetic administered A antimicrobial bonded/coated triple lumen catheter was placed in the left internal jugular vein using the Seldinger technique.  Evaluation Blood flow good Complications: No apparent complications Patient did tolerate procedure well. Chest X-ray ordered to verify placement.  CXR: pending.  Procedure performed under direct ultrasound guidance for real time vessel cannulation.      [name], [name] Pager: [phone]  or [phone] 12/28/2015, 11:09 PM

## 2015-12-28 NOTE — Progress Notes (Signed)
eLink Physician-Brief Progress Note Patient Name: [name] DOB: 1956/05/04 MRN: [id number]   Date of Service  12/28/2015  HPI/Events of Note  Camera check on patient with acute hypoxic respiratory failure secondary to pneumonia. Patient remains on BiPAP support.  FiO2 1.0. IPAP increased to 15 due to increased work of breathing and persistent hypoxia. Saturating 98% with respiratory rate greater than 35 breaths per minute. Patient reports she feels comfortable and is able to converse with me through the mask.  eICU Interventions  Spoke with patient's bedside nurse and respiratory therapist in preparation for endotracheal intubation. Intensivist to come to bedside.     Intervention Category Major Interventions: Respiratory failure - evaluation and management  Tera Partridge 12/28/2015, 7:36 PM

## 2015-12-28 NOTE — Progress Notes (Signed)
eLink Physician-Brief Progress Note Patient Name: [name] DOB: 1956-10-02 MRN: [id number]   Date of Service  12/28/2015  HPI/Events of Note  Notified by nurse of hypotension in the setting of sedation  For ventilator support. Currently requiring FiO2 1.0. Patient is receiving IV fluid bolus.  Has peripheral IV access.  eICU Interventions  1. Holding on paralytic until goal sedation reached 2. Discontinuing IV fluid bolus 3. Starting very low dose peripheral Neo-Synephrine with low threshold for central line placement & Levophed To maintain blood pressure within parameters.     Intervention Category Major Interventions: Respiratory failure - evaluation and management;Shock - evaluation and management  Tera Partridge 12/28/2015, 9:58 PM

## 2015-12-28 NOTE — Progress Notes (Signed)
Pt noted to be short of breath, displaying labored breathing. Pt hypoxic after receiving breathing treatment at 77%, bp 96/44 respirations 33/min. Pt remains alert and oriented. Place on non-rebreather mask, sating 98%. Orders received, rapid response nurse at bedside.

## 2015-12-28 NOTE — Progress Notes (Signed)
Pharmacy Antibiotic Note  [name] is a 60 y.o. female admitted on 12/27/2015 with pneumonia.  Pharmacy has been consulted to transition the patient from Vancomycin + Cefepime back to Rocephin + Azithromycin for CAP coverage.   The patient's last dose of Azithromycin was 3/8 @ 1619. Cefepime was given earlier today at 1506.  Plan: 1. Start Azithromycin 500 mg IV every 24 hours 2. Start Rocephin 1g IV every 24 hours 3. Antibiotics were started on 3/8, would recommend adding a stop date between 3/12-3/14 after a total LOT of 5-7 days 4. Pharmacy will sign off of dosing protocols as no further dose adjustments are expected at this time.   Height: 5\' 9"  (175.3 cm) Weight: 233 lb 4 oz (105.8 kg) IBW/kg (Calculated) : 66.2  Temp (24hrs), Avg:98.8 F (37.1 C), Min:98 F (36.7 C), Max:100.3 F (37.9 C)   Recent Labs Lab 12/27/15 1140 12/27/15 1517 12/27/15 2024 12/28/15 0706 12/28/15 1624  WBC 18.2*  --   --  19.0*  --   CREATININE 0.98  --   --  0.63  --   LATICACIDVEN  --  3.56* 2.3*  --  1.5    Estimated Creatinine Clearance: 99.2 mL/min (by C-G formula based on Cr of 0.63).    No Known Allergies  Antimicrobials this admission: Azithromycin 3/8 x 1; 3/9 >> Rocephin 3/8 x 1; 3/9 >> Cefepime 3/9 x 1 Vanc 3/9 x 1  Dose adjustments this admission: n/a  Microbiology results: 3/8 BCx >> ngtd 3/9 UCx >> 3/8 Rcx >> Other: Influenza neg, S pneumo neg  Thank you for allowing pharmacy to be a part of this patient's care.  Alycia Rossetti, PharmD, BCPS Clinical Pharmacist Pager: [phone] 12/28/2015 6:19 PM

## 2015-12-28 NOTE — Procedures (Addendum)
Intubation Procedure Note [name] [id number] May 26, 1956  Procedure: Intubation Indications: Respiratory insufficiency  Procedure Details Consent: Risks of procedure as well as the alternatives and risks of each were explained to the (patient/caregiver).  Consent for procedure obtained. Time Out: Verified patient identification, verified procedure, site/side was marked, verified correct patient position, special equipment/implants available, medications/allergies/relevent history reviewed, required imaging and test results available.    Maximum sterile technique was used including gloves, hand hygiene and mask.  MAC and 3  Using Gleidoscope, I was able to visualize the VC. Pt was intubated w/o difficulty. She ended up biting on the tube so we had to paralyze her.  meds give:  Versed 4 mg, fentanyl 200 mg, succinylcholine 50 mg -- all IV and divided dose.   Evaluation Hemodynamic Status: Transient hypotension treated with fluid; O2 sats: stable throughout Patient's Current Condition: stable Complications: No apparent complications Patient did tolerate procedure well. Chest X-ray ordered to verify placement.  CXR: tube position acceptable.   [place] 12/28/2015

## 2015-12-28 NOTE — Consult Note (Signed)
PULMONARY / CRITICAL CARE MEDICINE   Name: ANE METHOD MRN: [id number] DOB: September 23, 1956    ADMISSION DATE:  12/27/2015 CONSULTATION DATE:  12/28/2015  REFERRING MD:  RAI  CHIEF COMPLAINT:  SOB  HISTORY OF PRESENT ILLNESS:   60 year old female with PMH as below who presented to [facility] ED 3/8 with complaints of flu like symptoms x about 1 week. Onset of symptoms approximately 3/1 and consisted of hard dry cough, fever/chills, nausea/vomitting, body aches, and headache. Symptoms progressed to include SOB which progressively worsened until she presented to ED 3/8. In the emergency department she was mildly febrile to 40F, and was hypoxemic with oxygen saturation 89% on room air. She was started on Rocephin and azithromycin as well as supplemental oxygen and symptoms improved. She is admitted to the hospitalist team, however over the following 24 hours dyspnea worsened to the point that she required BiPAP. Despite initiation of BiPAP she remained tachypneic with respiratory rate in the 40s. PCCM to see.  PAST MEDICAL HISTORY :  She  has a past medical history of Anxiety; CAP (community acquired pneumonia); Depression; and Tobacco use.  PAST SURGICAL HISTORY: She  has past surgical history that includes open reduction left ankle; Abdominal hysterectomy (2011); and Cholecystectomy.  No Known Allergies  No current facility-administered medications on file prior to encounter.   No current outpatient prescriptions on file prior to encounter.    FAMILY HISTORY:  Her indicated that her mother is deceased. She indicated that her father is deceased. She indicated that her sister is deceased. She indicated that her daughter is alive.   SOCIAL HISTORY: She  reports that she has never smoked. She does not have any smokeless tobacco history on file. She reports that she does not drink alcohol or use illicit drugs.  REVIEW OF SYSTEMS:   Review of Systems:   Bolds are positive  Constitutional: weight  loss, gain, night sweats, Fevers, chills, fatigue .  HEENT: headaches, Sore throat, sneezing, nasal congestion, post nasal drip, Difficulty swallowing, Tooth/dental problems, visual complaints visual changes, ear ache CV:  chest pain, radiates: ,Orthopnea, PND, swelling in lower extremities, dizziness, palpitations, syncope.  GI  heartburn, indigestion, abdominal pain, nausea, vomiting, diarrhea, change in bowel habits, loss of appetite, bloody stools.  Resp: cough, productive: , hemoptysis, dyspnea, chest pain, pleuritic.  Skin: rash or itching or icterus GU: dysuria, change in color of urine, urgency or frequency. flank pain, hematuria  MS: joint pain or swelling. decreased range of motion  Psych: change in mood or affect. depression or anxiety.  Neuro: difficulty with speech, weakness, numbness, ataxia    SUBJECTIVE:    VITAL SIGNS: BP 126/69 mmHg  Pulse 93  Temp(Src) 98.4 F (36.9 C) (Oral)  Resp 45  Ht 5\' 9"  (1.753 m)  Wt 105.8 kg (233 lb 4 oz)  BMI 34.43 kg/m2  SpO2 99%  HEMODYNAMICS:    VENTILATOR SETTINGS: Vent Mode:  [-] BIPAP;PSV FiO2 (%):  [100 %] 100 % PEEP:  [5 cmH20] 5 cmH20 Pressure Support:  [10 cmH20] 10 cmH20  INTAKE / OUTPUT: I/O last 3 completed shifts: In: 1328.3 [P.O.:960; I.V.:368.3] Out: 300 [Urine:300]  PHYSICAL EXAMINATION: General:  Obese female in mild distress on BiPAP Neuro:  Alert, oriented, non focal HEENT:  [place]/AT, PERRL, no JVD Cardiovascular:  Mildly tahcy, regular, no MRG Lungs:  Markedly coarse, diminished on R.  Abdomen:  Soft, non-tender, non-distended Musculoskeletal:  No acute deformity or ROM limitation. Skin:  Grossly intact  LABS:  BMET  Recent Labs Lab 12/27/15 1140 12/28/15 0706  NA 139 135  K 3.0* 3.3*  CL 98* 104  CO2 20* 20*  BUN 16 12  CREATININE 0.98 0.63  GLUCOSE 129* 141*    Electrolytes  Recent Labs Lab 12/27/15 1140 12/27/15 2108 12/28/15 0706  CALCIUM 9.4  --  7.8*  MG  --  1.8  --      CBC  Recent Labs Lab 12/27/15 1140 12/28/15 0706  WBC 18.2* 19.0*  HGB 13.2 10.8*  HCT 39.2 32.5*  PLT 270 254    Coag's  Recent Labs Lab 12/27/15 2108  APTT 30  INR 1.32    Sepsis Markers  Recent Labs Lab 12/27/15 1517 12/27/15 2024 12/27/15 2108 12/28/15 1624  LATICACIDVEN 3.56* 2.3*  --  1.5  PROCALCITON  --   --  9.15  --     ABG  Recent Labs Lab 12/28/15 1613  PHART 7.309*  PCO2ART 48.9*  PO2ART 92.5    Liver Enzymes  Recent Labs Lab 12/27/15 1140  AST 60*  ALT 40  ALKPHOS 298*  BILITOT 1.1  ALBUMIN 2.4*    Cardiac Enzymes No results for input(s): TROPONINI, PROBNP in the last 168 hours.  Glucose No results for input(s): GLUCAP in the last 168 hours.  Imaging Ct Angio Chest Pe W/cm &/or Wo Cm  12/28/2015  CLINICAL DATA:  Hypoxia , pneumonia EXAM: CT ANGIOGRAPHY CHEST WITH CONTRAST TECHNIQUE: Multidetector CT imaging of the chest was performed using the standard protocol during bolus administration of intravenous contrast. Multiplanar CT image reconstructions and MIPs were obtained to evaluate the vascular anatomy. CONTRAST:  80 cc Omnipaque 350 COMPARISON:  None. FINDINGS: Mediastinum/Lymph Nodes: There is no supraclavicular adenopathy. Right hilar lymph node measures 1.2 cm mild enlarged. There is borderline enlarged precarinal lymph node measures 1 cm. Sub- carinal lymph node measures 1 cm borderline enlarged. No left hilar adenopathy is noted. There is no aortic aneurysm. Heart size is within normal limits. No pulmonary embolus is noted. Central airways are patent. Mild pectus excavatum deformity. Central airways are patent. Images of the thoracic inlet are unremarkable. Lungs/Pleura: There is small left pleural effusion. There is extensive infiltrate/consolidation with air bronchogram in right lower lobe right perihilar region and right middle lobe. Findings are consistent with pneumonia. Bilateral upper lobe there is patchy parenchymal  somewhat geographic airspace attenuation with some air bronchogram. Findings highly suspicious for evolving pneumonia. Less likely ARDS or pulmonary edema. Upper abdomen: No adrenal gland mass is noted in visualized upper abdomen. The visualized spleen is unremarkable. The visualized liver is unremarkable. Musculoskeletal: No rib fractures are noted. Sagittal images of the spine shows mild degenerative changes lower thoracic spine. Review of the MIP images confirms the above findings. IMPRESSION: 1. There is no evidence of pulmonary embolus. Mild mediastinal and right hilar adenopathy probable reactive. 2. No aortic aneurysm. 3. Extensive infiltrate/consolidation in right lower lobe right perihilar and right middle lobe with air bronchogram highly suspicious for pneumonia. There is patchy geographic airspace opacification bilateral upper lobes. Findings highly suspicious for evolving pneumonia. Less likely ARDS or pulmonary edema. Clinical correlation is necessary. 4. Trace right pleural effusion and some posterior pleural thickening. 5. Mild pectus excavatum deformity in right midline lower chest anteriorly. 6. Mild degenerative changes lower thoracic spine. Electronically Signed   By: Lahoma Crocker M.D.   On: 12/28/2015 11:27     STUDIES:  3/9 CTA chest > There is no evidence of pulmonary embolus. Mild mediastinal and right hilar  adenopathy probable reactive. No aortic aneurysm. Extensive infiltrate/consolidation in right lower lobe right perihilar and right middle lobe with air bronchogram highly suspicious for pneumonia. There is patchy geographic airspace opacification bilateral upper lobes. Findings highly suspicious for evolving pneumonia. Less likely ARDS or pulmonary edema. Trace right pleural effusion and some posterior pleural thickening. Mild pectus excavatum deformity in right midline lower chest anteriorly.  CULTURES: FLU PCR neg BCx2 3/8 >>> Urine strep anitgen 3/9 > Urine legionella 3/9  > RVP 3/9 >  ANTIBIOTICS: Ceftriaxone 3/8 > Azithromycin 3/8 > Tamiflu  3/9 >  SIGNIFICANT EVENTS: 3/8 admit 3/9 BiPAP to ICU  LINES/TUBES:   DISCUSSION: 60 year old female with no significant PMH presenting with flu-like symptoms for one week. Hypoxemic and febrile in ED. Hypoxemia and dyspnea worsened requiring BiPAP 3/9. Moved to ICU. Likely CAP vs viral PNA. Will continue CAP coverage including azithro for atypicals. Assess RVP, urine strep, legionella. Add tamiflu for now. Hope will be able to avoid intubation.  ASSESSMENT / PLAN:  PULMONARY A: Acute hypoxemic respiratory failure secondary to CAP vs viral pneumonitis  P:   Continue BiPAP for now Suspect will require intubation Target SpO2 > 92% Repeat ABG CXR in AM  CARDIOVASCULAR A:  No acute issues  P:  Telemetry monitoring  RENAL A:   Hypokalemia Pseudohypocalcemia  P:   Repeat BMP Correct electrolytes as indicated  GASTROINTESTINAL A:   No acute issues  P:   NPO PPI for SUP  HEMATOLOGIC A:   Anemia, suspect secondary to hemodilution   P:  Trend CBC  INFECTIOUS A:   R sided CAP vs viral PNA  P:   ABX as above PCT algorithm Follow BCx2 Add urine strep, legionella, and RVP If intubated, add tracheal aspirate culture and viral panel.   ENDOCRINE A:   No acute issues  P:   Follow glucose on chemistries  NEUROLOGIC A:   No acute issues  P:   RASS goal: 0 Monitor   FAMILY  - Updates: Family updated by [facility] 3/9  - Inter-disciplinary family meet or Palliative Care meeting due by:  3/16  APP Critical care time 87 mins  Georgann Housekeeper, AGACNP-BC [place] [facility] Pager [phone] or [phone]  12/28/2015 6:05 PM     ATTENDING NOTE: I have personally reviewed patient's available data, including medical history, events of note, physical examination and test results as part of my evaluation. I have discussed with resident/NP Georgann Housekeeper.    60 y.o.  year old female, admitted for flu like sx over 1 week and worsening SOB. Pt was hypoxemic with oxygen saturation 89% on room air. She was started on Rocephin and azithromycin as well as supplemental oxygen and symptoms improved. She is admitted to the hospitalist team, however over the following 24 hours dyspnea worsened to the point that she required BiPAP. Despite initiation of BiPAP she remained tachypneic with respiratory rate in the 40s. PCCM to see. Patient's respiratory status worsened despite BiPAP. She was then intubated.   On exam, prior to intubation, in respiratory distress with respiratory rate in the 40s. 130/80, pulse rate of 80, sats 90% on 100% FiO2 on the BiPAP. Labored breathing. No neck vein distention. BiPAP mask on face. Positive accessory muscle use. Crackles mid to bases. Some wheezing at the bases. Good S1 and S2. Good bowel sounds. Trace edema. Warm extremities. Rest of physical exam per Paul's  documentation   Labs reviewed. Pertinent labs include the following: ABG with 7.3, 49,  93 on 100%. Lactic acid was 1.3. WBC of 19. Influenza was negative.   Imaging reviewed personally. CTA with no filling defects, bilateral infiltrates, worse in the right mid and lower lung zones. Small effusions.   Impression: Acute hypoxemic hypercapnic respiratory failure/ARDS secondary to multifocal pneumonia, viral versus bacterial. Less likely pulmonary edema as patient with no history of any cardiac problems and CTA findings not too impressive for pulmonary edema.    Plan: 1. Patient was intubated for respiratory failure, not better on BiPAP. 2. We will initiate ARDS protocol. We will start patient on paralytics and sedatives. 3. Consider pronating patient. 4. Blood pressure dropped after intubation. We are hydrating patient. We'll start pressors as well. 5. Continue Rocephin and azithromycin for now. We'll give a dose of vancomycin 1. Adjust antibiotics once with results of culture.  Rapid flu was negative. Will broaden antibiotics if she clinically worsens overnight. 6. Pulmicort twice a day, DuoNeb 4 times a day. 5. Check 2-D echo 6. Keep nothing by mouth for now. start to feeds in the morning 7. Start steroids for severe pna.  8. dvt prophylaxis    Code status :Full code.   I have extensively discussed with the patient and her husband regarding her overall critical nature. They understand that patient is critically ill with ARDS/severe respiratory failure..  Critical Care Time devoted to patient care services described in this note independent of APP time is 36 minutes.   Monica Becton, MD 12/28/2015, 10:54 PM [place] Pager [phone] After 3 pm or if no answer, call [phone]

## 2015-12-28 NOTE — Significant Event (Addendum)
Rapid Response Event Note  Overview: Time Called: [id number] Arrival Time: [id number] Event Type: Respiratory  Initial Focused Assessment: Patient with sudden desat into 44s while lying in the bed.  Patient placed on NRB, O2 sats improved but patient remains labored.   Upon my arrival patient with shallow rapid rr 30-40, abdominal breathing.  Lung sounds with rhonchi and crackle on right mostly clear on left.  Patient is alert and Oriented BP 147/60  SR 90s,  RR 36 on 100% NRB O2 sats 97%  Temp 98.4 Husband at bedside  Interventions: ABG done, 20 mg lasix given IV Foley placed Solumedrol given IV Patient placed on Bipap 1mg  Morphine given Pt with RR 45-50   CCM at bedside Transfer to 3M05  Event Summary: Name of Physician Notified: Dr Tana Coast at 1604  Name of Consulting Physician Notified: CCM - Eddie Dibbles NP, Ramaswamy MD at [facility]  Outcome: Transferred (Comment) (69m03)     Raliegh Ip

## 2015-12-28 NOTE — Progress Notes (Signed)
Pt transported to ICU on via bipap 100% fio2 w/ no apparent complications, sat [id number]. ICU RT aware.

## 2015-12-29 ENCOUNTER — Inpatient Hospital Stay (HOSPITAL_COMMUNITY): Payer: BLUE CROSS/BLUE SHIELD

## 2015-12-29 DIAGNOSIS — R0600 Dyspnea, unspecified: Secondary | ICD-10-CM

## 2015-12-29 LAB — BLOOD GAS, ARTERIAL
ACID-BASE EXCESS: 0.8 mmol/L (ref 0.0–2.0)
Acid-Base Excess: 1 mmol/L (ref 0.0–2.0)
BICARBONATE: 25.6 meq/L — AB (ref 20.0–24.0)
BICARBONATE: 25.7 meq/L — AB (ref 20.0–24.0)
Drawn by: 398661
Drawn by: 398661
FIO2: 0.8
LHR: 400 {breaths}/min
O2 SAT: 96.8 %
O2 Saturation: 98.2 %
PATIENT TEMPERATURE: 98.6
PEEP/CPAP: 14 cmH2O
PEEP/CPAP: 14 cmH2O
PH ART: 7.357 (ref 7.350–7.450)
PH ART: 7.379 (ref 7.350–7.450)
PO2 ART: 90.5 mmHg (ref 80.0–100.0)
Patient temperature: 98.6
RATE: 30 resp/min
TCO2: 27 mmol/L (ref 0–100)
TCO2: 27.2 mmol/L (ref 0–100)
VT: 400 mL
pCO2 arterial: 47 mmHg — ABNORMAL HIGH (ref 35.0–45.0)
pCO2 arterial: 44.4 mmHg (ref 35.0–45.0)
pO2, Arterial: 128 mmHg — ABNORMAL HIGH (ref 80.0–100.0)

## 2015-12-29 LAB — BASIC METABOLIC PANEL
ANION GAP: 14 (ref 5–15)
BUN: 18 mg/dL (ref 6–20)
CALCIUM: 8.3 mg/dL — AB (ref 8.9–10.3)
CO2: 24 mmol/L (ref 22–32)
CREATININE: 0.58 mg/dL (ref 0.44–1.00)
Chloride: 100 mmol/L — ABNORMAL LOW (ref 101–111)
Glucose, Bld: 189 mg/dL — ABNORMAL HIGH (ref 65–99)
Potassium: 3.4 mmol/L — ABNORMAL LOW (ref 3.5–5.1)
SODIUM: 138 mmol/L (ref 135–145)

## 2015-12-29 LAB — GLUCOSE, CAPILLARY
GLUCOSE-CAPILLARY: 169 mg/dL — AB (ref 65–99)
Glucose-Capillary: 174 mg/dL — ABNORMAL HIGH (ref 65–99)
Glucose-Capillary: 201 mg/dL — ABNORMAL HIGH (ref 65–99)
Glucose-Capillary: 229 mg/dL — ABNORMAL HIGH (ref 65–99)

## 2015-12-29 LAB — CBC
HEMATOCRIT: 33 % — AB (ref 36.0–46.0)
Hemoglobin: 10.9 g/dL — ABNORMAL LOW (ref 12.0–15.0)
MCH: 29.7 pg (ref 26.0–34.0)
MCHC: 33 g/dL (ref 30.0–36.0)
MCV: 89.9 fL (ref 78.0–100.0)
Platelets: 287 10*3/uL (ref 150–400)
RBC: 3.67 MIL/uL — AB (ref 3.87–5.11)
RDW: 15.3 % (ref 11.5–15.5)
WBC: 31.9 10*3/uL — ABNORMAL HIGH (ref 4.0–10.5)

## 2015-12-29 LAB — GRAM STAIN

## 2015-12-29 LAB — URINE CULTURE

## 2015-12-29 LAB — TRIGLYCERIDES: TRIGLYCERIDES: 327 mg/dL — AB (ref <150)

## 2015-12-29 LAB — ECHOCARDIOGRAM COMPLETE
HEIGHTINCHES: 69 in
Weight: 3731.95 oz

## 2015-12-29 LAB — MRSA PCR SCREENING: MRSA BY PCR: NEGATIVE

## 2015-12-29 MED ORDER — PERFLUTREN LIPID MICROSPHERE
1.0000 mL | INTRAVENOUS | Status: AC | PRN
  Administered 2015-12-29: 3 mL via INTRAVENOUS
  Filled 2015-12-29: qty 10

## 2015-12-29 MED ORDER — ADULT MULTIVITAMIN W/MINERALS CH
1.0000 | ORAL_TABLET | Freq: Every day | ORAL | Status: DC
  Administered 2015-12-29 – 2016-01-07 (×9): 1
  Filled 2015-12-29 (×9): qty 1

## 2015-12-29 MED ORDER — VITAL HIGH PROTEIN PO LIQD
1000.0000 mL | ORAL | Status: DC
  Administered 2015-12-29: 14:00:00
  Administered 2015-12-29 – 2015-12-30 (×4): 1000 mL
  Administered 2015-12-30 (×7)
  Administered 2015-12-30: 1000 mL
  Administered 2015-12-31: 14:00:00
  Administered 2015-12-31: 1000 mL
  Administered 2015-12-31: 08:00:00
  Administered 2015-12-31: 1000 mL
  Administered 2015-12-31 (×2)
  Administered 2016-01-01: 1000 mL

## 2015-12-29 MED ORDER — PRO-STAT SUGAR FREE PO LIQD
60.0000 mL | Freq: Four times a day (QID) | ORAL | Status: DC
  Administered 2015-12-29 – 2016-01-01 (×10): 60 mL
  Filled 2015-12-29 (×10): qty 60

## 2015-12-29 MED ORDER — PRO-STAT SUGAR FREE PO LIQD
30.0000 mL | Freq: Three times a day (TID) | ORAL | Status: DC
  Administered 2015-12-29: 30 mL
  Filled 2015-12-29: qty 30

## 2015-12-29 MED ORDER — ANTISEPTIC ORAL RINSE SOLUTION (CORINZ)
7.0000 mL | OROMUCOSAL | Status: DC
  Administered 2015-12-29: 7 mL via OROMUCOSAL

## 2015-12-29 MED ORDER — VITAL HIGH PROTEIN PO LIQD
1000.0000 mL | ORAL | Status: DC
  Administered 2015-12-29: 1000 mL

## 2015-12-29 MED ORDER — CHLORHEXIDINE GLUCONATE 0.12% ORAL RINSE (MEDLINE KIT): 15.0000 mL | Freq: Two times a day (BID) | OROMUCOSAL | Status: DC

## 2015-12-29 MED ORDER — INSULIN ASPART 100 UNIT/ML ~~LOC~~ SOLN
0.0000 [IU] | SUBCUTANEOUS | Status: DC
  Administered 2015-12-29 (×2): 3 [IU] via SUBCUTANEOUS
  Administered 2015-12-30: 2 [IU] via SUBCUTANEOUS
  Administered 2015-12-30: 1 [IU] via SUBCUTANEOUS
  Administered 2015-12-30: 2 [IU] via SUBCUTANEOUS
  Administered 2015-12-30 (×2): 1 [IU] via SUBCUTANEOUS
  Administered 2015-12-30 – 2015-12-31 (×2): 2 [IU] via SUBCUTANEOUS
  Administered 2016-01-01: 1 [IU] via SUBCUTANEOUS
  Administered 2016-01-01: 2 [IU] via SUBCUTANEOUS
  Administered 2016-01-02 (×3): 1 [IU] via SUBCUTANEOUS
  Administered 2016-01-02: 2 [IU] via SUBCUTANEOUS
  Administered 2016-01-03 – 2016-01-04 (×8): 1 [IU] via SUBCUTANEOUS

## 2015-12-29 NOTE — Care Management Note (Signed)
Case Management Note  Patient Details  Name: [name] MRN: [id number] Date of Birth: 1956-04-10  Subjective/Objective:   Pt admitted to [facility] on 12/28/15 with VDRF secondary to bilateral pneumonia.  PTA, pt resided at home with spouse.                   Action/Plan: Pt currently remains sedated and on ventilator; will follow for discharge planning as pt progresses.    Expected Discharge Date:          Expected Discharge Plan:  [place]  In-House Referral:     Discharge planning Services  CM Consult  Post Acute Care Choice:    Choice offered to:     DME Arranged:    DME Agency:     HH Arranged:    [place] Agency:     Status of Service:  In process, will continue to follow  Medicare Important Message Given:    Date Medicare IM Given:    Medicare IM give by:    Date Additional Medicare IM Given:    Additional Medicare Important Message give by:     If discussed at [place] of Stay Meetings, dates discussed:    Additional Comments:  Reinaldo Raddle, RN, BSN  Trauma/Neuro ICU Case Manager [phone]

## 2015-12-29 NOTE — Progress Notes (Signed)
Called CCM to verify blood pressure parameters before paralyzing patient. CCM nurse to pass along to doctor. Will continue to monitor. Lianne Bushy RN BSN.

## 2015-12-29 NOTE — Progress Notes (Signed)
PULMONARY / CRITICAL CARE MEDICINE   Name: [name] MRN: [id number] DOB: November 14, 1955    ADMISSION DATE:  12/27/2015 CONSULTATION DATE:  12/28/2015  REFERRING MD:  RAI  CHIEF COMPLAINT:  SOB  HISTORY OF PRESENT ILLNESS:   60 year old female with PMH as below who presented to [facility] ED 3/8 with complaints of flu like symptoms x about 1 week. Onset of symptoms approximately 3/1 and consisted of hard dry cough, fever/chills, nausea/vomitting, body aches, and headache. Symptoms progressed to include SOB which progressively worsened until she presented to ED 3/8. In the emergency department she was mildly febrile to 26F, and was hypoxemic with oxygen saturation 89% on room air. She was started on Rocephin and azithromycin as well as supplemental oxygen and symptoms improved. She is admitted to the hospitalist team, however over the following 24 hours dyspnea worsened to the point that she required BiPAP. Despite initiation of BiPAP she remained tachypneic with respiratory rate in the 40s. PCCM to see.    SUBJECTIVE: intubated, sedated Afebrile Good UO   VITAL SIGNS: BP 124/66 mmHg  Pulse 57  Temp(Src) 97.2 F (36.2 C) (Axillary)  Resp 30  Ht 5\' 9"  (1.753 m)  Wt 233 lb 4 oz (105.8 kg)  BMI 34.43 kg/m2  SpO2 94%  HEMODYNAMICS:    VENTILATOR SETTINGS: Vent Mode:  [-] PRVC FiO2 (%):  [60 %-100 %] 60 % Set Rate:  [16 bmp-30 bmp] 30 bmp Vt Set:  [400 mL] 400 mL PEEP:  [5 cmH20-14 cmH20] 14 cmH20 Pressure Support:  [10 cmH20] 10 cmH20 Plateau Pressure:  [21 cmH20-23 cmH20] 21 cmH20  INTAKE / OUTPUT: I/O last 3 completed shifts: In: 2238.3 [P.O.:960; I.V.:1228.3; IV Piggyback:50] Out: 2875 [Urine:2875]  PHYSICAL EXAMINATION: General:  Obese female in mild distress on BiPAP Neuro:  Sedated , RASS-1, easily aroused, non focal HEENT:  [place]/AT, PERRL, no JVD Cardiovascular:  Mildly tahcy, regular, no MRG Lungs:  Markedly coarse, diminished on R.  Abdomen:  Soft, non-tender,  non-distended Musculoskeletal:  No acute deformity or ROM limitation. Skin:  Grossly intact  LABS:  BMET  Recent Labs Lab 12/27/15 1140 12/28/15 0706 12/29/15 0925  NA 139 135 138  K 3.0* 3.3* 3.4*  CL 98* 104 100*  CO2 20* 20* 24  BUN 16 12 18   CREATININE 0.98 0.63 0.58  GLUCOSE 129* 141* 189*    Electrolytes  Recent Labs Lab 12/27/15 1140 12/27/15 2108 12/28/15 0706 12/29/15 0925  CALCIUM 9.4  --  7.8* 8.3*  MG  --  1.8  --   --     CBC  Recent Labs Lab 12/27/15 1140 12/28/15 0706 12/29/15 0925  WBC 18.2* 19.0* 31.9*  HGB 13.2 10.8* 10.9*  HCT 39.2 32.5* 33.0*  PLT 270 254 287    Coag's  Recent Labs Lab 12/27/15 2108  APTT 30  INR 1.32    Sepsis Markers  Recent Labs Lab 12/27/15 2024 12/27/15 2108 12/28/15 1624 12/28/15 1921  LATICACIDVEN 2.3*  --  1.5 1.3  PROCALCITON  --  9.15  --   --     ABG  Recent Labs Lab 12/28/15 2226 12/28/15 2335 12/29/15 0430  PHART 7.318* 7.357 7.379  PCO2ART 53.5* 47.0* 44.4  PO2ART 57.9* 128* 90.5    Liver Enzymes  Recent Labs Lab 12/27/15 1140  AST 60*  ALT 40  ALKPHOS 298*  BILITOT 1.1  ALBUMIN 2.4*    Cardiac Enzymes No results for input(s): TROPONINI, PROBNP in the last 168 hours.  Glucose  Recent Labs Lab 12/29/15 1159  GLUCAP 169*    Imaging Dg Chest 1 View  12/28/2015  CLINICAL DATA:  Endotracheal tube placement EXAM: CHEST 1 VIEW COMPARISON:  12/26/2005 FINDINGS: Endotracheal tube tip is 2.8 cm above the carina. Extensive bilateral perihilar airspace disease, worse on the right and essentially new on the left. NG tube crosses the gastroesophageal junction. IMPRESSION: Extensive bilateral perihilar opacities. Endotracheal tube as described. Electronically Signed   By: Skipper Cliche M.D.   On: 12/28/2015 21:43   Dg Chest Port 1 View  12/28/2015  CLINICAL DATA:  Central line placement EXAM: PORTABLE CHEST 1 VIEW COMPARISON:  12/28/2015 FINDINGS: The endotracheal tube with  tip measuring 3 cm above the carina. Enteric tube tip is off the field of view but below the left hemidiaphragm. A left central venous catheter has been placed with tip over the cavoatrial junction. No pneumothorax. Shallow inspiration with elevation of the right hemidiaphragm. Atelectasis in the lung bases. Bilateral perihilar consolidation could represent pneumonia or edema. IMPRESSION: Appliances appear in satisfactory position. Bilateral perihilar consolidation similar to previous study. Electronically Signed   By: Lucienne Capers M.D.   On: 12/28/2015 23:31     STUDIES:  3/9 CTA chest > There is no evidence of pulmonary embolus. Mild mediastinal and right hilar adenopathy probable reactive. No aortic aneurysm. Extensive infiltrate/consolidation in right lower lobe right perihilar and right middle lobe with air bronchogram highly suspicious for pneumonia. There is patchy geographic airspace opacification bilateral upper lobes. Findings highly suspicious for evolving pneumonia. Less likely ARDS or pulmonary edema. Trace right pleural effusion and some posterior pleural thickening. Mild pectus excavatum deformity in right midline lower chest anteriorly.  CULTURES: FLU PCR neg BCx2 3/8 >>> Urine strep anitgen 3/9 > Urine legionella 3/9 > RVP 3/9 >  ANTIBIOTICS: Ceftriaxone 3/8 > Azithromycin 3/8 > Tamiflu  3/9 >  SIGNIFICANT EVENTS: 3/8 admit 3/9 BiPAP to ICU, intubated , ards protocol  LINES/TUBES:   DISCUSSION: 60 year old female with no significant PMH presenting with flu-like symptoms for one week. Hypoxemic and febrile in ED. Hypoxemia and dyspnea worsened requiring BiPAP 3/9. Moved to ICU. Likely CAP vs viral PNA.   ASSESSMENT / PLAN:  PULMONARY A: Acute hypoxemic respiratory failure secondary to CAP vs viral pneumonitis ARDS P:   ARDS protocol for PEEP/FIO2 Dc solumedrol - no bspasm If worse, will consider paralytic and/or proning - no vent asynchrony this  am  CARDIOVASCULAR A:  Septic shock  Vs related to sedative meds  P:  Neo gtt  RENAL A:   Hypokalemia Pseudohypocalcemia  P:   Repeat BMP Correct electrolytes as indicated  GASTROINTESTINAL A:   No acute issues  P:   NPO PPI for SUP Start TFs  HEMATOLOGIC A:   Anemia, suspect secondary to hemodilution   P:  Trend CBC  INFECTIOUS A:   R sided CAP vs viral PNA  P:   ABX as above  urine strep neg, Await legionella, and RVP   ENDOCRINE A:   Hyperglycemia due to steroids  P:   Follow glucose on chemistries  NEUROLOGIC A:   Sedation  P:   RASS goal: 0 PAD protocol - propofol + fent gtt   FAMILY  - Updates: husband updated 3/10  - Inter-disciplinary family meet or Palliative Care meeting due by:  3/16  The patient is critically ill with multiple organ systems failure and requires high complexity decision making for assessment and support, frequent evaluation and titration of  therapies, application of advanced monitoring technologies and extensive interpretation of multiple databases. Critical Care Time devoted to patient care services described in this note independent of APP time is 35 minutes.   Kara Mead MD. [place] Flood. [facility] [place] [facility] Pager [phone] If no response call [phone]    12/29/2015 12:08 PM

## 2015-12-29 NOTE — Progress Notes (Signed)
Called CCM again regarding patient plan of care, CCM nurse gave no new orders and said MD was going to hold off on paralyzing patient. Will continue to monitor. Lianne Bushy RN BSN.

## 2015-12-29 NOTE — Progress Notes (Signed)
  Echocardiogram 2D Echocardiogram has been performed.  Donata Clay 12/29/2015, 11:50 AM

## 2015-12-29 NOTE — Progress Notes (Signed)
eLink Physician-Brief Progress Note Patient Name: [name] DOB: 17-Aug-1956 MRN: [id number]   Date of Service  12/29/2015  HPI/Events of Note  Hyperglycemia worsening on tube feeds. Blood glucose greater than 200 per nurse report.  eICU Interventions  1. Continue Accu-Cheks every 4 hours 2. Low-dose sliding scale insulin per algorithm 3. Check hemoglobin A1c     Intervention Category Intermediate Interventions: Hyperglycemia - evaluation and treatment  Tera Partridge 12/29/2015, 4:55 PM

## 2015-12-29 NOTE — Progress Notes (Signed)
RT Note: Sputum culture obtained & sent to lab with no complications. 

## 2015-12-29 NOTE — Progress Notes (Signed)
Initial Nutrition Assessment  DOCUMENTATION CODES:   Obesity unspecified  INTERVENTION:   Initiate Vital High Protein @ 10 ml/hr via OG tube   60 ml Prostat QID.    MVI daily  Tube feeding regimen provides 1040 kcal, 141 grams of protein, and 200 ml of H2O.  TF regimen and propofol at current rate providing 1541 total kcal/day (14.5 kcal/kg of actual weight)  NUTRITION DIAGNOSIS:   Inadequate oral intake related to inability to eat as evidenced by NPO status.  GOAL:   Provide needs based on ASPEN/SCCM guidelines  MONITOR:   TF tolerance, Vent status, Labs  REASON FOR ASSESSMENT:   Ventilator, Consult Enteral/tube feeding initiation and management  ASSESSMENT:   60 year old female with no significant PMH presenting with flu-like symptoms for one week. Hypoxemic and febrile in ED. Hypoxemia and dyspnea worsened requiring BiPAP 3/9. Moved to ICU. Likely CAP vs viral PNA.   3/9 intubated on ARDS protocol with severe PNA, no paralytics or pronating pt for now MV: 12.2 L/min Temp (24hrs), Avg:97.9 F (36.6 C), Min:97.2 F (36.2 C), Max:98.9 F (37.2 C)  Propofol: 19 ml/hr provides: 501 kcal per day from lipid Medications reviewed Labs reviewed: potassium low 3.4, TG elevated 327  Unable to complete Nutrition-Focused physical exam at this time.  Pt discussed during ICU rounds and with RN.  Discussed with Dr Elsworth Soho, will start nutrition  Diet Order:  Diet NPO time specified  Skin:  Reviewed, no issues  Last BM:  3/8  Height:   Ht Readings from Last 1 Encounters:  12/27/15 5\' 9"  (1.753 m)    Weight:   Wt Readings from Last 1 Encounters:  12/27/15 233 lb 4 oz (105.8 kg)    Ideal Body Weight:  65.9 kg  BMI:  Body mass index is 34.43 kg/(m^2).  Estimated Nutritional Needs:   Kcal:  [id number]  Protein:  >131 grams  Fluid:  > 1.5 L/day  EDUCATION NEEDS:   No education needs identified at this time  [place], [place], [place]  Pager [phone] After Hours Pager

## 2015-12-30 ENCOUNTER — Inpatient Hospital Stay (HOSPITAL_COMMUNITY): Payer: BLUE CROSS/BLUE SHIELD

## 2015-12-30 LAB — BASIC METABOLIC PANEL
Anion gap: 9 (ref 5–15)
BUN: 24 mg/dL — AB (ref 6–20)
CHLORIDE: 103 mmol/L (ref 101–111)
CO2: 27 mmol/L (ref 22–32)
CREATININE: 0.61 mg/dL (ref 0.44–1.00)
Calcium: 8.3 mg/dL — ABNORMAL LOW (ref 8.9–10.3)
GFR calc non Af Amer: >60 mL/min (ref >60)
Glucose, Bld: 199 mg/dL — ABNORMAL HIGH (ref 65–99)
POTASSIUM: 3.4 mmol/L — AB (ref 3.5–5.1)
SODIUM: 139 mmol/L (ref 135–145)

## 2015-12-30 LAB — CBC
HEMATOCRIT: 31.8 % — AB (ref 36.0–46.0)
Hemoglobin: 9.8 g/dL — ABNORMAL LOW (ref 12.0–15.0)
MCH: 28.1 pg (ref 26.0–34.0)
MCHC: 30.8 g/dL (ref 30.0–36.0)
MCV: 91.1 fL (ref 78.0–100.0)
PLATELETS: 287 10*3/uL (ref 150–400)
RBC: 3.49 MIL/uL — ABNORMAL LOW (ref 3.87–5.11)
RDW: 15.4 % (ref 11.5–15.5)
WBC: 23.7 10*3/uL — AB (ref 4.0–10.5)

## 2015-12-30 LAB — GLUCOSE, CAPILLARY
GLUCOSE-CAPILLARY: 104 mg/dL — AB (ref 65–99)
GLUCOSE-CAPILLARY: 141 mg/dL — AB (ref 65–99)
GLUCOSE-CAPILLARY: 180 mg/dL — AB (ref 65–99)
Glucose-Capillary: 175 mg/dL — ABNORMAL HIGH (ref 65–99)
Glucose-Capillary: 146 mg/dL — ABNORMAL HIGH (ref 65–99)
Glucose-Capillary: 136 mg/dL — ABNORMAL HIGH (ref 65–99)

## 2015-12-30 LAB — TRIGLYCERIDES: TRIGLYCERIDES: 400 mg/dL — AB (ref <150)

## 2015-12-30 LAB — BLOOD GAS, ARTERIAL
ACID-BASE EXCESS: 3 mmol/L — AB (ref 0.0–2.0)
Bicarbonate: 27.2 mEq/L — ABNORMAL HIGH (ref 20.0–24.0)
DRAWN BY: 398661
FIO2: 60
MECHVT: 400 mL
O2 Saturation: 98.9 %
PATIENT TEMPERATURE: 98.6
PEEP/CPAP: 10 cmH2O
PH ART: 7.413 (ref 7.350–7.450)
PO2 ART: 150 mmHg — AB (ref 80.0–100.0)
RATE: 30 resp/min
TCO2: 28.5 mmol/L (ref 0–100)
pCO2 arterial: 43.4 mmHg (ref 35.0–45.0)

## 2015-12-30 LAB — HEMOGLOBIN A1C
HEMOGLOBIN A1C: 6.8 % — AB (ref 4.8–5.6)
MEAN PLASMA GLUCOSE: 148 mg/dL

## 2015-12-30 MED ORDER — ACETAMINOPHEN 160 MG/5ML PO SOLN
650.0000 mg | Freq: Four times a day (QID) | ORAL | Status: DC | PRN
  Administered 2015-12-31 – 2016-01-01 (×3): 650 mg via ORAL
  Filled 2015-12-30 (×3): qty 20.3

## 2015-12-30 MED ORDER — PANTOPRAZOLE SODIUM 40 MG PO PACK
40.0000 mg | PACK | ORAL | Status: DC
  Administered 2015-12-30 – 2016-01-03 (×5): 40 mg
  Filled 2015-12-30 (×5): qty 20

## 2015-12-30 NOTE — Progress Notes (Signed)
PULMONARY / CRITICAL CARE MEDICINE   Name: [name] MRN: [id number] DOB: [birth year]    ADMISSION DATE:  12/27/2015 CONSULTATION DATE:  12/28/2015  REFERRING MD:  RAI  CHIEF COMPLAINT:  SOB  SUBJECTIVE:  Remains on high PEEP/FiO2  VITAL SIGNS: BP 102/57 mmHg  Pulse 58  Temp(Src) 98.2 F (36.8 C) (Axillary)  Resp 30  Ht 5\' 9"  (1.753 m)  Wt 243 lb 9.7 oz (110.5 kg)  BMI 35.96 kg/m2  SpO2 96%  VENTILATOR SETTINGS: Vent Mode:  [-] PRVC FiO2 (%):  [60 %-80 %] 60 % Set Rate:  [30 bmp] 30 bmp Vt Set:  [400 mL] 400 mL PEEP:  [10 cmH20-14 cmH20] 10 cmH20 Plateau Pressure:  [20 cmH20-26 cmH20] 25 cmH20  INTAKE / OUTPUT: I/O last 3 completed shifts: In: 2728.7 [I.V.:2347.9; NG/GT:80.8; IV Piggyback:300] Out: 3625 [Urine:3625]  PHYSICAL EXAMINATION: General: sedated Neuro: RASS -3 HEENT: ETT in place Cardiac: regular Chest: b/l bronchial breath sounds, no wheeze Abd: soft, non tender Ext: 1+ edema Skin: no rashes  LABS:  BMET  Recent Labs Lab 12/28/15 0706 12/29/15 0925 12/30/15 0441  NA 135 138 139  K 3.3* 3.4* 3.4*  CL 104 100* 103  CO2 20* 24 27  BUN 12 18 24*  CREATININE 0.63 0.58 0.61  GLUCOSE 141* 189* 199*    Electrolytes  Recent Labs Lab 12/27/15 2108 12/28/15 0706 12/29/15 0925 12/30/15 0441  CALCIUM  --  7.8* 8.3* 8.3*  MG 1.8  --   --   --     CBC  Recent Labs Lab 12/28/15 0706 12/29/15 0925 12/30/15 0441  WBC 19.0* 31.9* 23.7*  HGB 10.8* 10.9* 9.8*  HCT 32.5* 33.0* 31.8*  PLT 254 287 287    Coag's  Recent Labs Lab 12/27/15 2108  APTT 30  INR 1.32    Sepsis Markers  Recent Labs Lab 12/27/15 2024 12/27/15 2108 12/28/15 1624 12/28/15 1921  LATICACIDVEN 2.3*  --  1.5 1.3  PROCALCITON  --  9.15  --   --     ABG  Recent Labs Lab 12/28/15 2335 12/29/15 0430 12/30/15 0445  PHART 7.357 7.379 7.413  PCO2ART 47.0* 44.4 43.4  PO2ART 128* 90.5 150*    Liver Enzymes  Recent Labs Lab 12/27/15 1140   AST 60*  ALT 40  ALKPHOS 298*  BILITOT 1.1  ALBUMIN 2.4*    Cardiac Enzymes No results for input(s): TROPONINI, PROBNP in the last 168 hours.  Glucose  Recent Labs Lab 12/29/15 1159 12/29/15 1647 12/29/15 2019 12/29/15 2348 12/30/15 0341  GLUCAP 169* 201* 229* 174* 180*    Imaging No results found.   STUDIES:  3/9 CTA chest > There is no evidence of pulmonary embolus. Mild mediastinal and right hilar adenopathy probable reactive. No aortic aneurysm. Extensive infiltrate/consolidation in right lower lobe right perihilar and right middle lobe with air bronchogram highly suspicious for pneumonia. There is patchy geographic airspace opacification bilateral upper lobes. Findings highly suspicious for evolving pneumonia. Less likely ARDS or pulmonary edema. Trace right pleural effusion and some posterior pleural thickening. Mild pectus excavatum deformity in right midline lower chest anteriorly.  CULTURES: FLU PCR neg BCx2 3/8 >>> Urine strep anitgen 3/9 > Urine legionella 3/9 > RVP 3/9 >  ANTIBIOTICS: Ceftriaxone 3/8 > Azithromycin 3/8 > Tamiflu  3/9 >  SIGNIFICANT EVENTS: 3/8 admit 3/9 BiPAP to ICU, intubated , ards protocol  LINES/TUBES: 3/09 ETT >> 3/09 Lt IJ CVL >> 3/10 Rt radial aline >>  DISCUSSION: 60 year  old female with no significant PMH presenting with flu-like symptoms for one week. Hypoxemic and febrile in ED. Hypoxemia and dyspnea worsened requiring BiPAP 3/9. Moved to ICU. Likely CAP vs viral PNA.   ASSESSMENT / PLAN:  PULMONARY A: Acute hypoxic respiratory failure 2nd to PNA with ARDS. Hx of tobacco abuse. P:   Wean PEEP/FiO2 per ARDS protocol F/u CXR, ABG duoneb   CARDIOVASCULAR A:  Septic shock. P:  Wean pressors to keep MAP > 65  RENAL A:   Hypokalemia. P:   Correct electrolytes as indicated  GASTROINTESTINAL A:   Nutrition. P:   PPI for SUP Tube feeds while on vent  HEMATOLOGIC A:   Anemia of critical illness. P:   Trend CBC Lovenox for DVT prevention  INFECTIOUS A:   Septic shock from pneumonia. P:   Day 3 of rocephin, zithromax Day 2 of tamiflu F/u respiratory viral panel, procalcitonin  ENDOCRINE A:   Hyperglycemia. P:   SSI  NEUROLOGIC A:   Acute septic encephalopathy. P:   RASS goal: -2 to -3  CC time 32 minutes.  Chesley Mires, MD [facility] Pulmonary/Critical Care 12/30/2015, 6:21 AM Pager:  [phone] After 3pm call: [phone]

## 2015-12-31 ENCOUNTER — Inpatient Hospital Stay (HOSPITAL_COMMUNITY): Payer: BLUE CROSS/BLUE SHIELD

## 2015-12-31 LAB — GLUCOSE, CAPILLARY
GLUCOSE-CAPILLARY: 100 mg/dL — AB (ref 65–99)
GLUCOSE-CAPILLARY: 102 mg/dL — AB (ref 65–99)
GLUCOSE-CAPILLARY: 105 mg/dL — AB (ref 65–99)
Glucose-Capillary: 95 mg/dL (ref 65–99)
Glucose-Capillary: 115 mg/dL — ABNORMAL HIGH (ref 65–99)
Glucose-Capillary: 158 mg/dL — ABNORMAL HIGH (ref 65–99)

## 2015-12-31 LAB — CBC
HCT: 32.3 % — ABNORMAL LOW (ref 36.0–46.0)
Hemoglobin: 9.9 g/dL — ABNORMAL LOW (ref 12.0–15.0)
MCH: 28.4 pg (ref 26.0–34.0)
MCHC: 30.7 g/dL (ref 30.0–36.0)
MCV: 92.8 fL (ref 78.0–100.0)
PLATELETS: 404 10*3/uL — AB (ref 150–400)
RBC: 3.48 MIL/uL — ABNORMAL LOW (ref 3.87–5.11)
RDW: 15.5 % (ref 11.5–15.5)
WBC: 19.4 10*3/uL — AB (ref 4.0–10.5)

## 2015-12-31 LAB — BLOOD GAS, ARTERIAL
ACID-BASE EXCESS: 5 mmol/L — AB (ref 0.0–2.0)
BICARBONATE: 29.1 meq/L — AB (ref 20.0–24.0)
DRAWN BY: 41308
FIO2: 0.4
MECHVT: 400 mL
O2 SAT: 92.7 %
PATIENT TEMPERATURE: 98.6
PEEP/CPAP: 10 cmH2O
PH ART: 7.439 (ref 7.350–7.450)
PO2 ART: 66.6 mmHg — AB (ref 80.0–100.0)
RATE: 30 resp/min
TCO2: 30.4 mmol/L (ref 0–100)
pCO2 arterial: 43.7 mmHg (ref 35.0–45.0)

## 2015-12-31 LAB — BASIC METABOLIC PANEL
Anion gap: 7 (ref 5–15)
BUN: 37 mg/dL — AB (ref 6–20)
CO2: 29 mmol/L (ref 22–32)
CREATININE: 0.55 mg/dL (ref 0.44–1.00)
Calcium: 8 mg/dL — ABNORMAL LOW (ref 8.9–10.3)
Chloride: 105 mmol/L (ref 101–111)
Glucose, Bld: 120 mg/dL — ABNORMAL HIGH (ref 65–99)
POTASSIUM: 3 mmol/L — AB (ref 3.5–5.1)
SODIUM: 141 mmol/L (ref 135–145)

## 2015-12-31 LAB — CULTURE, RESPIRATORY

## 2015-12-31 LAB — TRIGLYCERIDES: Triglycerides: 455 mg/dL — ABNORMAL HIGH (ref <150)

## 2015-12-31 LAB — CULTURE, RESPIRATORY W GRAM STAIN

## 2015-12-31 MED ORDER — MIDAZOLAM HCL 2 MG/2ML IJ SOLN
2.0000 mg | INTRAMUSCULAR | Status: DC | PRN
  Administered 2016-01-01 – 2016-01-04 (×4): 2 mg via INTRAVENOUS
  Filled 2015-12-31 (×5): qty 2

## 2015-12-31 MED ORDER — POTASSIUM CHLORIDE 20 MEQ/15ML (10%) PO SOLN
30.0000 meq | ORAL | Status: AC
  Administered 2015-12-31 (×2): 30 meq
  Filled 2015-12-31 (×2): qty 30

## 2015-12-31 MED ORDER — IPRATROPIUM-ALBUTEROL 0.5-2.5 (3) MG/3ML IN SOLN
RESPIRATORY_TRACT | Status: AC
Start: 2015-12-31 — End: 2015-12-31
  Administered 2015-12-31: 3 mL via RESPIRATORY_TRACT
  Filled 2015-12-31: qty 3

## 2015-12-31 NOTE — Progress Notes (Signed)
PULMONARY / CRITICAL CARE MEDICINE   Name: [name] MRN: [id number] DOB: May 06, 1956    ADMISSION DATE:  12/27/2015 CONSULTATION DATE:  12/28/2015  REFERRING MD:  RAI  CHIEF COMPLAINT:  SOB  SUBJECTIVE:  Remains on pressors.  VITAL SIGNS: BP 101/56 mmHg  Pulse 52  Temp(Src) 99 F (37.2 C) (Oral)  Resp 30  Ht 5\' 9"  (1.753 m)  Wt 248 lb 3.8 oz (112.6 kg)  BMI 36.64 kg/m2  SpO2 94%  LMP   VENTILATOR SETTINGS: Vent Mode:  [-] PRVC FiO2 (%):  [40 %-60 %] 40 % Set Rate:  [30 bmp] 30 bmp Vt Set:  [400 mL] 400 mL PEEP:  [8 cmH20-10 cmH20] 10 cmH20 Plateau Pressure:  [20 [id number] cmH20] 24 cmH20  INTAKE / OUTPUT: I/O last 3 completed shifts: In: 3300.8 [I.V.:2760.8; NG/GT:340; IV Piggyback:200] Out: 3275 [Urine:3275]  PHYSICAL EXAMINATION: General: sedated Neuro: RASS -2, opens eyes with stimulation HEENT: ETT in place Cardiac: regular Chest: b/l bronchial breath sounds, no wheeze Abd: soft, non tender Ext: 1+ edema Skin: no rashes  LABS:  BMET  Recent Labs Lab 12/29/15 0925 12/30/15 0441 12/31/15 0420  NA 138 139 141  K 3.4* 3.4* 3.0*  CL 100* 103 105  CO2 24 27 29   BUN 18 24* 37*  CREATININE 0.58 0.61 0.55  GLUCOSE 189* 199* 120*    Electrolytes  Recent Labs Lab 12/27/15 2108  12/29/15 0925 12/30/15 0441 12/31/15 0420  CALCIUM  --   < > 8.3* 8.3* 8.0*  MG 1.8  --   --   --   --   < > = values in this interval not displayed.  CBC  Recent Labs Lab 12/29/15 0925 12/30/15 0441 12/31/15 0420  WBC 31.9* 23.7* 19.4*  HGB 10.9* 9.8* 9.9*  HCT 33.0* 31.8* 32.3*  PLT 287 287 404*    Coag's  Recent Labs Lab 12/27/15 2108  APTT 30  INR 1.32    Sepsis Markers  Recent Labs Lab 12/27/15 2024 12/27/15 2108 12/28/15 1624 12/28/15 1921  LATICACIDVEN 2.3*  --  1.5 1.3  PROCALCITON  --  9.15  --   --     ABG  Recent Labs Lab 12/29/15 0430 12/30/15 0445 12/31/15 0340  PHART 7.379 7.413 7.439  PCO2ART 44.4 43.4 43.7   PO2ART 90.5 150* 66.6*    Liver Enzymes  Recent Labs Lab 12/27/15 1140  AST 60*  ALT 40  ALKPHOS 298*  BILITOT 1.1  ALBUMIN 2.4*    Cardiac Enzymes No results for input(s): TROPONINI, PROBNP in the last 168 hours.  Glucose  Recent Labs Lab 12/30/15 1145 12/30/15 1555 12/30/15 2025 12/30/15 2348 12/31/15 0415 12/31/15 0722  GLUCAP 146* 136* 141* 104* 105* 100*    Imaging No results found.   STUDIES:  3/9 CTA chest > There is no evidence of pulmonary embolus. Mild mediastinal and right hilar adenopathy probable reactive. No aortic aneurysm. Extensive infiltrate/consolidation in right lower lobe right perihilar and right middle lobe with air bronchogram highly suspicious for pneumonia. There is patchy geographic airspace opacification bilateral upper lobes. Findings highly suspicious for evolving pneumonia. Less likely ARDS or pulmonary edema. Trace right pleural effusion and some posterior pleural thickening. Mild pectus excavatum deformity in right midline lower chest anteriorly.  CULTURES: FLU PCR neg BCx2 3/8 >>> Urine strep anitgen 3/9 > Urine legionella 3/9 > RVP 3/9 >  ANTIBIOTICS: Ceftriaxone 3/8 > Azithromycin 3/8 > Tamiflu  3/9 >  SIGNIFICANT EVENTS: 3/8 admit 3/9 BiPAP to  ICU, intubated , ards protocol  LINES/TUBES: 3/09 ETT >> 3/09 Lt IJ CVL >> 3/10 Rt radial aline >>  DISCUSSION: 60 year old female with no significant PMH presenting with flu-like symptoms for one week. Hypoxemic and febrile in ED. Hypoxemia and dyspnea worsened requiring BiPAP 3/9. Moved to ICU. Likely CAP vs viral PNA.   ASSESSMENT / PLAN:  PULMONARY A: Acute hypoxic respiratory failure 2nd to PNA with ARDS. Hx of tobacco abuse. P:   Wean PEEP/FiO2 per ARDS protocol F/u CXR duoneb   CARDIOVASCULAR A:  Septic shock. P:  Wean pressors to keep MAP > 65  RENAL A:   Hypokalemia. P:   Correct electrolytes as indicated  GASTROINTESTINAL A:   Nutrition. P:    PPI for SUP Tube feeds while on vent  HEMATOLOGIC A:   Anemia of critical illness. P:  Trend CBC Lovenox for DVT prevention  INFECTIOUS A:   Septic shock from pneumonia. P:   Day 4 of rocephin, zithromax Day 3 of tamiflu F/u respiratory viral panel  ENDOCRINE A:   Hyperglycemia. P:   SSI  NEUROLOGIC A:   Acute septic encephalopathy. Elevated triglyceride from propofol >> d/c'ed 3/12. P:   RASS goal 0 to -1 Fentanyl gtt with prn versed  CC time 32 minutes.  Chesley Mires, MD [facility] Pulmonary/Critical Care 12/31/2015, 9:10 AM Pager:  [phone] After 3pm call: [phone]

## 2015-12-31 NOTE — Progress Notes (Signed)
[facility] ADULT ICU REPLACEMENT PROTOCOL FOR AM LAB REPLACEMENT ONLY  The patient does apply for the [facility] Adult ICU Electrolyte Replacment Protocol based on the criteria listed below:   1. Is GFR >/= 40 ml/min? Yes.    Patient's GFR today is >60 2. Is urine output >/= 0.5 ml/kg/hr for the last 6 hours? Yes.   Patient's UOP is 0.74 ml/kg/hr 3. Is BUN < 60 mg/dL? Yes.    Patient's BUN today is 37 4. Abnormal electrolyte(s):  K - 3.0 5. Ordered repletion with: PER PROTOCOL 6. If a panic level lab has been reported, has the CCM MD in charge been notified? Yes.  .   Physician:  Dr. Margit Banda 12/31/2015 5:59 AM

## 2016-01-01 ENCOUNTER — Inpatient Hospital Stay (HOSPITAL_COMMUNITY): Payer: BLUE CROSS/BLUE SHIELD

## 2016-01-01 DIAGNOSIS — A419 Sepsis, unspecified organism: Principal | ICD-10-CM

## 2016-01-01 DIAGNOSIS — J9601 Acute respiratory failure with hypoxia: Secondary | ICD-10-CM

## 2016-01-01 LAB — RESPIRATORY VIRUS PANEL
ADENOVIRUS: NEGATIVE
INFLUENZA A: NEGATIVE
INFLUENZA B 1: NEGATIVE
METAPNEUMOVIRUS: NEGATIVE
PARAINFLUENZA 1 A: NEGATIVE
PARAINFLUENZA 3 A: NEGATIVE
Parainfluenza 2: NEGATIVE
Respiratory Syncytial Virus A: NEGATIVE
Respiratory Syncytial Virus B: NEGATIVE
Rhinovirus: NEGATIVE

## 2016-01-01 LAB — CBC
HCT: 34.2 % — ABNORMAL LOW (ref 36.0–46.0)
Hemoglobin: 10 g/dL — ABNORMAL LOW (ref 12.0–15.0)
MCH: 28 pg (ref 26.0–34.0)
MCHC: 29.2 g/dL — ABNORMAL LOW (ref 30.0–36.0)
MCV: 95.8 fL (ref 78.0–100.0)
PLATELETS: 411 10*3/uL — AB (ref 150–400)
RBC: 3.57 MIL/uL — ABNORMAL LOW (ref 3.87–5.11)
RDW: 15.5 % (ref 11.5–15.5)
WBC: 18.1 10*3/uL — ABNORMAL HIGH (ref 4.0–10.5)

## 2016-01-01 LAB — BASIC METABOLIC PANEL
Anion gap: 8 (ref 5–15)
BUN: 30 mg/dL — ABNORMAL HIGH (ref 6–20)
CALCIUM: 8.1 mg/dL — AB (ref 8.9–10.3)
CO2: 31 mmol/L (ref 22–32)
CREATININE: 0.47 mg/dL (ref 0.44–1.00)
Chloride: 107 mmol/L (ref 101–111)
GFR calc non Af Amer: >60 mL/min (ref >60)
GLUCOSE: 129 mg/dL — AB (ref 65–99)
Potassium: 3.8 mmol/L (ref 3.5–5.1)
Sodium: 146 mmol/L — ABNORMAL HIGH (ref 135–145)

## 2016-01-01 LAB — CULTURE, BLOOD (ROUTINE X 2)
CULTURE: NO GROWTH
Culture: NO GROWTH

## 2016-01-01 LAB — GLUCOSE, CAPILLARY
GLUCOSE-CAPILLARY: 157 mg/dL — AB (ref 65–99)
Glucose-Capillary: 118 mg/dL — ABNORMAL HIGH (ref 65–99)
Glucose-Capillary: 111 mg/dL — ABNORMAL HIGH (ref 65–99)
Glucose-Capillary: 130 mg/dL — ABNORMAL HIGH (ref 65–99)
Glucose-Capillary: 118 mg/dL — ABNORMAL HIGH (ref 65–99)

## 2016-01-01 LAB — TRIGLYCERIDES: Triglycerides: 377 mg/dL — ABNORMAL HIGH (ref <150)

## 2016-01-01 LAB — MAGNESIUM: Magnesium: 2.2 mg/dL (ref 1.7–2.4)

## 2016-01-01 MED ORDER — VITAL HIGH PROTEIN PO LIQD
1000.0000 mL | ORAL | Status: DC
  Administered 2016-01-02 – 2016-01-03 (×2): 1000 mL

## 2016-01-01 MED ORDER — FREE WATER
200.0000 mL | Freq: Three times a day (TID) | Status: DC
  Administered 2016-01-01 – 2016-01-04 (×10): 200 mL

## 2016-01-01 MED ORDER — PRO-STAT SUGAR FREE PO LIQD
30.0000 mL | Freq: Two times a day (BID) | ORAL | Status: DC
  Administered 2016-01-01 – 2016-01-03 (×5): 30 mL
  Filled 2016-01-01 (×5): qty 30

## 2016-01-01 NOTE — Progress Notes (Signed)
Nutrition Follow-up  DOCUMENTATION CODES:   Obesity unspecified  INTERVENTION:   -Increase Vital High Protein by 10 ml/h every 4 hours until goal of 50 ml/h is met  -30 ml Prostat BID  -Continue MVI daily  -Tube feeding regimen provides 1400 kcal, 135 grams of protein, 1003 ml of free water.   -Free water flush 200 ml every 8 hours, per MD   -TF free water + free water flushes provide 1603 ml of total free water  NUTRITION DIAGNOSIS:   Inadequate oral intake related to inability to eat as evidenced by NPO status.  -Ongoing   GOAL:   Provide needs based on ASPEN/SCCM guidelines  -Continue  MONITOR:   TF tolerance, Vent status, Labs  ASSESSMENT:   59 year old female with no significant PMH presenting with flu-like symptoms for one week. Hypoxemic and febrile in ED. Hypoxemia and dyspnea worsened requiring BiPAP 3/9. Moved to ICU. Likely CAP vs viral PNA.   3/9-intubated on ARDS protocol with severe PNA 3/12-Propofol d/c  Mv- 8.8 L/min Temp- 37C  Spoke with pt's RN.  Pt discussed during interdisciplinary rounds.    Labs reviewed; sodium elevated 146, TG elevated 377 Medications reviewed; multivitamin 1 tablet daily  Diet Order:  Diet NPO time specified  Skin:  Reviewed, no issues  Last BM:  3/8  Height:   Ht Readings from Last 1 Encounters:  12/27/15 5' 9"  (1.753 m)    Weight:   Wt Readings from Last 1 Encounters:  01/01/16 242 lb 8.1 oz (110 kg)    Ideal Body Weight:  65.9 kg  BMI:  Body mass index is 35.8 kg/(m^2).  Estimated Nutritional Needs:   Kcal:  3967-2897   Protein:  >131 grams   Fluid:  >1.5 L/day  EDUCATION NEEDS:   No education needs identified at this time  [place], Dietetic Intern Pager: [phone]

## 2016-01-01 NOTE — Progress Notes (Signed)
PULMONARY / CRITICAL CARE MEDICINE   Name: [name] MRN: [id number] DOB: 1956/09/23    ADMISSION DATE:  12/27/2015 CONSULTATION DATE:  12/28/2015  REFERRING MD:  RAI  CHIEF COMPLAINT:  SOB  SUBJECTIVE:  Phenylephrine weaned to off 3/12  VITAL SIGNS: BP 120/53 mmHg  Pulse 85  Temp(Src) 99 F (37.2 C) (Axillary)  Resp 24  Ht 5\' 9"  (1.753 m)  Wt 110 kg (242 lb 8.1 oz)  BMI 35.80 kg/m2  SpO2 93%  LMP   VENTILATOR SETTINGS: Vent Mode:  [-] PRVC FiO2 (%):  [40 %] 40 % Set Rate:  [24 bmp] 24 bmp Vt Set:  [400 mL] 400 mL PEEP:  [8 cmH20] 8 cmH20 Plateau Pressure:  [16 cmH20-21 cmH20] 19 cmH20  INTAKE / OUTPUT: I/O last 3 completed shifts: In: 2525.4 [I.V.:2075.4; NG/GT:350; IV Piggyback:100] Out: 3350 [Urine:3350]  PHYSICAL EXAMINATION: General: sedated Neuro: RASS -2, opens eyes with stimulation HEENT: ETT in place Cardiac: regular Chest: b/l bronchial breath sounds, no wheeze Abd: soft, non tender Ext: 1+ edema Skin: no rashes  LABS:  BMET  Recent Labs Lab 12/30/15 0441 12/31/15 0420 01/01/16 0410  NA 139 141 146*  K 3.4* 3.0* 3.8  CL 103 105 107  CO2 27 29 31   BUN 24* 37* 30*  CREATININE 0.61 0.55 0.47  GLUCOSE 199* 120* 129*    Electrolytes  Recent Labs Lab 12/27/15 2108  12/30/15 0441 12/31/15 0420 01/01/16 0410  CALCIUM  --   < > 8.3* 8.0* 8.1*  MG 1.8  --   --   --  2.2  < > = values in this interval not displayed.  CBC  Recent Labs Lab 12/30/15 0441 12/31/15 0420 01/01/16 0410  WBC 23.7* 19.4* 18.1*  HGB 9.8* 9.9* 10.0*  HCT 31.8* 32.3* 34.2*  PLT 287 404* 411*    Coag's  Recent Labs Lab 12/27/15 2108  APTT 30  INR 1.32    Sepsis Markers  Recent Labs Lab 12/27/15 2024 12/27/15 2108 12/28/15 1624 12/28/15 1921  LATICACIDVEN 2.3*  --  1.5 1.3  PROCALCITON  --  9.15  --   --     ABG  Recent Labs Lab 12/29/15 0430 12/30/15 0445 12/31/15 0340  PHART 7.379 7.413 7.439  PCO2ART 44.4 43.4 43.7   PO2ART 90.5 150* 66.6*    Liver Enzymes  Recent Labs Lab 12/27/15 1140  AST 60*  ALT 40  ALKPHOS 298*  BILITOT 1.1  ALBUMIN 2.4*    Cardiac Enzymes No results for input(s): TROPONINI, PROBNP in the last 168 hours.  Glucose  Recent Labs Lab 12/31/15 0722 12/31/15 1129 12/31/15 1555 12/31/15 1954 12/31/15 2340 01/01/16 0404  GLUCAP 100* 95 115* 158* 102* 118*    Imaging Dg Chest Port 1 View  01/01/2016  CLINICAL DATA:  Respiratory failure EXAM: PORTABLE CHEST 1 VIEW COMPARISON:  12/03/2015 FINDINGS: Cardiac shadow is stable. A an endotracheal tube, nasogastric catheter and left central venous line are again seen and stable. The overall inspiratory effort is poor. Persistent infiltrate is noted in the right lung base. IMPRESSION: Persistent right basilar infiltrate. Electronically Signed   By: Inez Catalina M.D.   On: 01/01/2016 08:12     STUDIES:  3/9 CTA chest > There is no evidence of pulmonary embolus. Mild mediastinal and right hilar adenopathy probable reactive. No aortic aneurysm. Extensive infiltrate/consolidation in right lower lobe right perihilar and right middle lobe with air bronchogram highly suspicious for pneumonia. There is patchy geographic airspace opacification bilateral  upper lobes. Findings highly suspicious for evolving pneumonia. Less likely ARDS or pulmonary edema. Trace right pleural effusion and some posterior pleural thickening. Mild pectus excavatum deformity in right midline lower chest anteriorly.  CULTURES: FLU PCR neg BCx2 3/8 >>> Urine strep anitgen 3/9 > negative Urine legionella 3/9 > RVP 3/9 >  ANTIBIOTICS: Ceftriaxone 3/8 > Azithromycin 3/8 > Tamiflu  3/9 >  SIGNIFICANT EVENTS: 3/8 admit 3/9 BiPAP to ICU, intubated , ards protocol  LINES/TUBES: 3/09 ETT >> 3/09 Lt IJ CVL >> 3/10 Rt radial aline >>  DISCUSSION: 60 year old female with no significant PMH presenting with flu-like symptoms for one week. Hypoxemic and  febrile in ED. Hypoxemia and dyspnea worsened requiring BiPAP 3/9. Moved to ICU. Likely CAP vs viral PNA.   ASSESSMENT / PLAN:  PULMONARY A: Acute hypoxic respiratory failure 2nd to PNA with ARDS. Hx of tobacco abuse. P:   Wean PEEP/FiO2 per ARDS protocol F/u CXR duoneb   CARDIOVASCULAR A:  Septic shock. P:  Pressors weaned off 3/13 Change CVP goal now that BP stabilized to 5-7 Antibiotics as below   RENAL A:   Hypokalemia, resolved Hypernatremia P:   Correct electrolytes as indicated Add free water 3/13  GASTROINTESTINAL A:   Nutrition. P:   PPI for SUP Tube feeds while on vent  HEMATOLOGIC A:   Anemia of critical illness. P:  Trend CBC Lovenox for DVT prevention  INFECTIOUS A:   Septic shock from pneumonia. P:   Day 5 of rocephin, zithromax Day 4 of tamiflu F/u respiratory viral panel, pending  ENDOCRINE A:   Hyperglycemia. P:   SSI  NEUROLOGIC A:   Acute septic encephalopathy. Elevated triglyceride from propofol >> d/c'ed 3/12. P:   RASS goal  -1 Fentanyl gtt with prn versed  CC time 35 minutes.   Baltazar Apo, MD, PhD 01/01/2016, 8:59 AM [facility] [phone] or if no answer [phone]

## 2016-01-02 ENCOUNTER — Inpatient Hospital Stay (HOSPITAL_COMMUNITY): Payer: BLUE CROSS/BLUE SHIELD

## 2016-01-02 LAB — GLUCOSE, CAPILLARY
GLUCOSE-CAPILLARY: 130 mg/dL — AB (ref 65–99)
GLUCOSE-CAPILLARY: 170 mg/dL — AB (ref 65–99)
Glucose-Capillary: 112 mg/dL — ABNORMAL HIGH (ref 65–99)
Glucose-Capillary: 119 mg/dL — ABNORMAL HIGH (ref 65–99)
Glucose-Capillary: 142 mg/dL — ABNORMAL HIGH (ref 65–99)

## 2016-01-02 LAB — BASIC METABOLIC PANEL
ANION GAP: 6 (ref 5–15)
BUN: 24 mg/dL — ABNORMAL HIGH (ref 6–20)
CALCIUM: 8.1 mg/dL — AB (ref 8.9–10.3)
CO2: 32 mmol/L (ref 22–32)
CREATININE: 0.5 mg/dL (ref 0.44–1.00)
Chloride: 107 mmol/L (ref 101–111)
GFR calc Af Amer: >60 mL/min (ref >60)
GFR calc non Af Amer: >60 mL/min (ref >60)
GLUCOSE: 130 mg/dL — AB (ref 65–99)
Potassium: 3.9 mmol/L (ref 3.5–5.1)
Sodium: 145 mmol/L (ref 135–145)

## 2016-01-02 LAB — CBC
HCT: 33.2 % — ABNORMAL LOW (ref 36.0–46.0)
HEMOGLOBIN: 9.5 g/dL — AB (ref 12.0–15.0)
MCH: 27.8 pg (ref 26.0–34.0)
MCHC: 28.6 g/dL — AB (ref 30.0–36.0)
MCV: 97.1 fL (ref 78.0–100.0)
Platelets: 488 10*3/uL — ABNORMAL HIGH (ref 150–400)
RBC: 3.42 MIL/uL — ABNORMAL LOW (ref 3.87–5.11)
RDW: 15.3 % (ref 11.5–15.5)
WBC: 15.5 10*3/uL — ABNORMAL HIGH (ref 4.0–10.5)

## 2016-01-02 LAB — TRIGLYCERIDES: TRIGLYCERIDES: 325 mg/dL — AB (ref <150)

## 2016-01-02 MED ORDER — BUSPIRONE HCL 15 MG PO TABS
15.0000 mg | ORAL_TABLET | Freq: Two times a day (BID) | ORAL | Status: DC
  Administered 2016-01-02 – 2016-01-07 (×8): 15 mg
  Filled 2016-01-02 (×13): qty 1

## 2016-01-02 MED ORDER — FUROSEMIDE 10 MG/ML IJ SOLN
40.0000 mg | Freq: Once | INTRAMUSCULAR | Status: AC
  Administered 2016-01-02: 40 mg via INTRAVENOUS
  Filled 2016-01-02: qty 4

## 2016-01-02 NOTE — Progress Notes (Signed)
Pt is still weaning at this time tolerating it well. Pt at this time doesn't tolerate FS well. Increase RR and MV. RN aware. Pt is pulling good volumes 660+ml. RT will continue to monitor

## 2016-01-02 NOTE — Progress Notes (Signed)
PULMONARY / CRITICAL CARE MEDICINE   Name: [name] MRN: [id number] DOB: August 20, 1956    ADMISSION DATE:  12/27/2015 CONSULTATION DATE:  12/28/2015  REFERRING MD:  RAI  CHIEF COMPLAINT:  SOB  SUBJECTIVE:  Calm Tolerating PSV 12/8 this am  VITAL SIGNS: BP 151/69 mmHg  Pulse 95  Temp(Src) 98.9 F (37.2 C) (Axillary)  Resp 12  Ht 5\' 9"  (1.753 m)  Wt 107 kg (235 lb 14.3 oz)  BMI 34.82 kg/m2  SpO2 95%  LMP   VENTILATOR SETTINGS: Vent Mode:  [-] PSV;CPAP FiO2 (%):  [40 %] 40 % Set Rate:  [24 bmp] 24 bmp Vt Set:  [400 mL] 400 mL PEEP:  [8 cmH20] 8 cmH20 Pressure Support:  [12 cmH20] 12 cmH20 Plateau Pressure:  [19 cmH20-22 cmH20] 19 cmH20  INTAKE / OUTPUT: I/O last 3 completed shifts: In: 2393 [I.V.:973; NG/GT:1370; IV Piggyback:50] Out: 3200 [Urine:3200]  PHYSICAL EXAMINATION: General: sedated Neuro: RASS -1, opens eyes to voice, nods to questions and moves all ext HEENT: ETT in place Cardiac: regular Chest: b/l bronchial breath sounds, no wheeze Abd: soft, non tender Ext: 1+ edema Skin: no rashes  LABS:  BMET  Recent Labs Lab 12/31/15 0420 01/01/16 0410 01/02/16 0450  NA 141 146* 145  K 3.0* 3.8 3.9  CL 105 107 107  CO2 29 31 32  BUN 37* 30* 24*  CREATININE 0.55 0.47 0.50  GLUCOSE 120* 129* 130*    Electrolytes  Recent Labs Lab 12/27/15 2108  12/31/15 0420 01/01/16 0410 01/02/16 0450  CALCIUM  --   < > 8.0* 8.1* 8.1*  MG 1.8  --   --  2.2  --   < > = values in this interval not displayed.  CBC  Recent Labs Lab 12/31/15 0420 01/01/16 0410 01/02/16 0450  WBC 19.4* 18.1* 15.5*  HGB 9.9* 10.0* 9.5*  HCT 32.3* 34.2* 33.2*  PLT 404* 411* 488*    Coag's  Recent Labs Lab 12/27/15 2108  APTT 30  INR 1.32    Sepsis Markers  Recent Labs Lab 12/27/15 2024 12/27/15 2108 12/28/15 1624 12/28/15 1921  LATICACIDVEN 2.3*  --  1.5 1.3  PROCALCITON  --  9.15  --   --     ABG  Recent Labs Lab 12/29/15 0430 12/30/15 0445  12/31/15 0340  PHART 7.379 7.413 7.439  PCO2ART 44.4 43.4 43.7  PO2ART 90.5 150* 66.6*    Liver Enzymes  Recent Labs Lab 12/27/15 1140  AST 60*  ALT 40  ALKPHOS 298*  BILITOT 1.1  ALBUMIN 2.4*    Cardiac Enzymes No results for input(s): TROPONINI, PROBNP in the last 168 hours.  Glucose  Recent Labs Lab 01/01/16 0821 01/01/16 1223 01/01/16 1555 01/01/16 2021 01/01/16 2335 01/02/16 0342  GLUCAP 111* 130* 118* 157* 112* 119*    Imaging Dg Chest Port 1 View  01/02/2016  CLINICAL DATA:  Ventilator dependent respiratory failure. Followup bilateral pneumonia. EXAM: PORTABLE CHEST 1 VIEW COMPARISON:  01/01/2016 and earlier, including CTA chest 12/28/2015. FINDINGS: Endotracheal tube tip in satisfactory position projecting approximately 5 cm above the carina. Nasogastric tube tip in satisfactory position in the fundus of the stomach. Left jugular central venous catheter tip projects over the mid to lower SVC, unchanged. Cardiac silhouette moderately enlarged, unchanged. Mild pulmonary venous hypertension without overt edema. Dense airspace consolidation in the right middle lobe, right lower lobe and central right upper lobe with air bronchograms, unchanged. Streaky and patchy airspace opacities in the left lower lobe with  partial silhouetting of the left hemidiaphragm, unchanged. No new pulmonary parenchymal abnormalities. IMPRESSION: 1. Support apparatus satisfactory. 2. Stable dense pneumonia in the right middle lobe, right lower lobe and central right upper lobe. 3. Stable patchy pneumonia in the left lower lobe. 4. Stable cardiomegaly without pulmonary edema. Electronically Signed   By: Evangeline Dakin M.D.   On: 01/02/2016 08:16     STUDIES:  3/9 CTA chest > There is no evidence of pulmonary embolus. Mild mediastinal and right hilar adenopathy probable reactive. No aortic aneurysm. Extensive infiltrate/consolidation in right lower lobe right perihilar and right middle lobe with  air bronchogram highly suspicious for pneumonia. There is patchy geographic airspace opacification bilateral upper lobes. Findings highly suspicious for evolving pneumonia. Less likely ARDS or pulmonary edema. Trace right pleural effusion and some posterior pleural thickening. Mild pectus excavatum deformity in right midline lower chest anteriorly.  CULTURES: FLU PCR negative BCx2 3/8 >>> negative Urine strep anitgen 3/9 > negative Urine legionella 3/9 > not collected RVP 3/9 > all negative  ANTIBIOTICS: Ceftriaxone 3/8 > Azithromycin 3/8 > Tamiflu  3/9 > 3/14  SIGNIFICANT EVENTS: 3/8 admit 3/9 BiPAP to ICU, intubated , ards protocol  LINES/TUBES: 3/09 ETT >> 3/09 Lt IJ CVL >> 3/10 Rt radial aline >>  DISCUSSION: 60 year old female with no significant PMH presenting with flu-like symptoms for one week. Hypoxemic and febrile in ED. Hypoxemia and dyspnea worsened requiring BiPAP 3/9. Moved to ICU. Likely CAP vs viral PNA.   ASSESSMENT / PLAN:  PULMONARY A: Acute hypoxic respiratory failure 2nd to PNA with ARDS. Hx of tobacco abuse. P:   Wean PEEP/FiO2 per ARDS protocol F/u CXR duoneb   CARDIOVASCULAR A:  Septic shock. P:  Pressors weaned off 3/13 Change CVP goal now that BP stabilized to 5-7 Antibiotics as below   RENAL A:   Hypokalemia, resolved Hypernatremia P:   Correct electrolytes as indicated Add free water 3/13 Start gentle diuresis on 3/14  GASTROINTESTINAL A:   Nutrition. P:   PPI for SUP Tube feeds while on vent  HEMATOLOGIC A:   Anemia of critical illness. P:  Trend CBC Lovenox for DVT prevention  INFECTIOUS A:   Septic shock from CA pneumonia. Viral panel negative P:   Day 6 of rocephin, zithromax Stopped tamiflu 3/14   ENDOCRINE A:   Hyperglycemia. P:   SSI protocol  NEUROLOGIC A:   Acute septic encephalopathy. Elevated triglyceride from propofol >> d/c'ed 3/12. P:   RASS goal  -1 Fentanyl gtt with prn versed  CC  time 35 minutes.   Baltazar Apo, MD, PhD 01/02/2016, 8:32 AM [facility] [phone] or if no answer [phone]

## 2016-01-03 ENCOUNTER — Inpatient Hospital Stay (HOSPITAL_COMMUNITY): Payer: BLUE CROSS/BLUE SHIELD

## 2016-01-03 LAB — CBC
HEMATOCRIT: 33.3 % — AB (ref 36.0–46.0)
Hemoglobin: 10 g/dL — ABNORMAL LOW (ref 12.0–15.0)
MCH: 29.3 pg (ref 26.0–34.0)
MCHC: 30 g/dL (ref 30.0–36.0)
MCV: 97.7 fL (ref 78.0–100.0)
Platelets: 587 10*3/uL — ABNORMAL HIGH (ref 150–400)
RBC: 3.41 MIL/uL — ABNORMAL LOW (ref 3.87–5.11)
RDW: 15.4 % (ref 11.5–15.5)
WBC: 14.2 10*3/uL — ABNORMAL HIGH (ref 4.0–10.5)

## 2016-01-03 LAB — BASIC METABOLIC PANEL
Anion gap: 7 (ref 5–15)
BUN: 24 mg/dL — AB (ref 6–20)
CO2: 33 mmol/L — ABNORMAL HIGH (ref 22–32)
Calcium: 8.8 mg/dL — ABNORMAL LOW (ref 8.9–10.3)
Chloride: 104 mmol/L (ref 101–111)
Creatinine, Ser: 0.49 mg/dL (ref 0.44–1.00)
GFR calc Af Amer: >60 mL/min (ref >60)
GLUCOSE: 160 mg/dL — AB (ref 65–99)
POTASSIUM: 4.1 mmol/L (ref 3.5–5.1)
Sodium: 144 mmol/L (ref 135–145)

## 2016-01-03 LAB — GLUCOSE, CAPILLARY
GLUCOSE-CAPILLARY: 134 mg/dL — AB (ref 65–99)
GLUCOSE-CAPILLARY: 124 mg/dL — AB (ref 65–99)
GLUCOSE-CAPILLARY: 133 mg/dL — AB (ref 65–99)
Glucose-Capillary: 114 mg/dL — ABNORMAL HIGH (ref 65–99)
Glucose-Capillary: 122 mg/dL — ABNORMAL HIGH (ref 65–99)
Glucose-Capillary: 133 mg/dL — ABNORMAL HIGH (ref 65–99)
Glucose-Capillary: 139 mg/dL — ABNORMAL HIGH (ref 65–99)
Glucose-Capillary: 140 mg/dL — ABNORMAL HIGH (ref 65–99)

## 2016-01-03 MED ORDER — FUROSEMIDE 10 MG/ML IJ SOLN
40.0000 mg | Freq: Once | INTRAMUSCULAR | Status: AC
  Administered 2016-01-03: 40 mg via INTRAVENOUS
  Filled 2016-01-03: qty 4

## 2016-01-03 MED ORDER — BISACODYL 10 MG RE SUPP
10.0000 mg | Freq: Every day | RECTAL | Status: DC | PRN
  Filled 2016-01-03: qty 1

## 2016-01-03 MED ORDER — DOCUSATE SODIUM 50 MG/5ML PO LIQD
100.0000 mg | Freq: Every day | ORAL | Status: DC
  Administered 2016-01-03: 100 mg
  Filled 2016-01-03: qty 10

## 2016-01-03 NOTE — Progress Notes (Signed)
PRN neb given, pt is now tolerating FS well. With neb and lavage suctioning pt now tolerating FS well. RT will continue to monitor. RN aware

## 2016-01-03 NOTE — Progress Notes (Signed)
PULMONARY / CRITICAL CARE MEDICINE   Name: [name] MRN: [id number] DOB: 1956/02/13    ADMISSION DATE:  12/27/2015 CONSULTATION DATE:  12/28/2015  REFERRING MD:  RAI  CHIEF COMPLAINT:  SOB  SUBJECTIVE:  Still sedated, has not done any spontaneous breathing at this morning  VITAL SIGNS: BP 138/56 mmHg  Pulse 78  Temp(Src) 99 F (37.2 C) (Axillary)  Resp 24  Ht 5\' 9"  (1.753 m)  Wt 106.9 kg (235 lb 10.8 oz)  BMI 34.79 kg/m2  SpO2 98%  LMP   VENTILATOR SETTINGS: Vent Mode:  [-] PRVC FiO2 (%):  [40 %] 40 % Set Rate:  [24 bmp] 24 bmp Vt Set:  [400 mL] 400 mL PEEP:  [8 cmH20] 8 cmH20 Pressure Support:  [10 cmH20-20 cmH20] 20 cmH20 Plateau Pressure:  [18 cmH20-20 cmH20] 20 cmH20  INTAKE / OUTPUT: I/O last 3 completed shifts: In: 4342.5 [I.V.:742.5; NG/GT:2500; IV Piggyback:1100] Out: 5075 [Urine:5075]  PHYSICAL EXAMINATION: General: sedated Neuro: RASS -1 to -2, opens eyes to voice, nods to questions and moves all ext HEENT: ETT in place Cardiac: regular Chest: b/l bronchial breath sounds, no wheeze Abd: soft, non tender Ext: 1+ edema Skin: no rashes  LABS:  BMET  Recent Labs Lab 01/01/16 0410 01/02/16 0450 01/03/16 0520  NA 146* 145 144  K 3.8 3.9 4.1  CL 107 107 104  CO2 31 32 33*  BUN 30* 24* 24*  CREATININE 0.47 0.50 0.49  GLUCOSE 129* 130* 160*    Electrolytes  Recent Labs Lab 12/27/15 2108  01/01/16 0410 01/02/16 0450 01/03/16 0520  CALCIUM  --   < > 8.1* 8.1* 8.8*  MG 1.8  --  2.2  --   --   < > = values in this interval not displayed.  CBC  Recent Labs Lab 01/01/16 0410 01/02/16 0450 01/03/16 0520  WBC 18.1* 15.5* 14.2*  HGB 10.0* 9.5* 10.0*  HCT 34.2* 33.2* 33.3*  PLT 411* 488* 587*    Coag's  Recent Labs Lab 12/27/15 2108  APTT 30  INR 1.32    Sepsis Markers  Recent Labs Lab 12/27/15 2024 12/27/15 2108 12/28/15 1624 12/28/15 1921  LATICACIDVEN 2.3*  --  1.5 1.3  PROCALCITON  --  9.15  --   --      ABG  Recent Labs Lab 12/29/15 0430 12/30/15 0445 12/31/15 0340  PHART 7.379 7.413 7.439  PCO2ART 44.4 43.4 43.7  PO2ART 90.5 150* 66.6*    Liver Enzymes  Recent Labs Lab 12/27/15 1140  AST 60*  ALT 40  ALKPHOS 298*  BILITOT 1.1  ALBUMIN 2.4*    Cardiac Enzymes No results for input(s): TROPONINI, PROBNP in the last 168 hours.  Glucose  Recent Labs Lab 01/02/16 0342 01/02/16 0842 01/02/16 1201 01/02/16 2019 01/02/16 2337 01/03/16 0328  GLUCAP 119* 142* 130* 170* 133* 124*    Imaging Dg Chest Port 1 View  01/03/2016  CLINICAL DATA:  Acute respiratory failure. EXAM: PORTABLE CHEST 1 VIEW COMPARISON:  01/02/2016 FINDINGS: Endotracheal tube is 3.2 cm above the carina. Nasogastric tube extends into the abdomen. There is a left jugular central line with the tip in the region of the lower SVC. There continues to be airspace disease in the right hilum and right lung base. Slightly improved aeration at the right lung base. Persistent elevation of right hemidiaphragm. There may be some airspace densities in the left lower lobe. Negative for pneumothorax. Heart size is upper limits of normal and unchanged. IMPRESSION: Decreasing airspace  disease in the right lung. Concern for increased densities at the left lung base. Recommend attention this area on follow up imaging. Support apparatuses as described.  Negative for pneumothorax. Electronically Signed   By: Markus Daft M.D.   On: 01/03/2016 07:53     STUDIES:  3/9 CTA chest > There is no evidence of pulmonary embolus. Mild mediastinal and right hilar adenopathy probable reactive. No aortic aneurysm. Extensive infiltrate/consolidation in right lower lobe right perihilar and right middle lobe with air bronchogram highly suspicious for pneumonia. There is patchy geographic airspace opacification bilateral upper lobes. Findings highly suspicious for evolving pneumonia. Less likely ARDS or pulmonary edema. Trace right pleural  effusion and some posterior pleural thickening. Mild pectus excavatum deformity in right midline lower chest anteriorly.  CULTURES: FLU PCR negative BCx2 3/8 >>> negative Urine strep anitgen 3/9 > negative Urine legionella 3/9 > not collected RVP 3/9 > all negative  ANTIBIOTICS: Ceftriaxone 3/8 > Azithromycin 3/8 > Tamiflu  3/9 > 3/14  SIGNIFICANT EVENTS: 3/8 admit 3/9 BiPAP to ICU, intubated , ards protocol  LINES/TUBES: 3/09 ETT >> 3/09 Lt IJ CVL >> 3/10 Rt radial aline >>  DISCUSSION: 60 year old female with no significant PMH presenting with flu-like symptoms for one week. Hypoxemic and febrile in ED. Hypoxemia and dyspnea worsened requiring BiPAP 3/9. Moved to ICU. Likely CAP vs viral PNA.   ASSESSMENT / PLAN:  PULMONARY A: Acute hypoxic respiratory failure 2nd to PNA with ARDS. Chest x-ray 3/15 with some slight basilar clearing compare with prior Hx of tobacco abuse. P:   Wean PEEP/FiO2 per ARDS protocol, goal pressure support on 3/15 F/u CXR duoneb   CARDIOVASCULAR A:  Septic shock. P:  Pressors weaned off 3/13 Change CVP goal now that BP stabilized to 5-7 Antibiotics as below   RENAL A:   Hypokalemia, resolved Hypernatremia P:   Correct electrolytes as indicated Added free water 3/13 continue gentle diuresis on 3/15  GASTROINTESTINAL A:   Nutrition. P:   PPI for SUP Tube feeds while on vent  HEMATOLOGIC A:   Anemia of critical illness. P:  Trend CBC Lovenox for DVT prevention  INFECTIOUS A:   Septic shock from CA pneumonia. Viral panel negative P:   Day 7 of rocephin, zithromax Stopped tamiflu 3/14   ENDOCRINE A:   Hyperglycemia. P:   SSI protocol  NEUROLOGIC A:   Acute septic encephalopathy. Elevated triglyceride from propofol >> d/c'ed 3/12. History depression P:   RASS goal  -1 Fentanyl gtt with prn versed Paxil and BuSpar   CC time 35 minutes.   Baltazar Apo, MD, PhD 01/03/2016, 8:34 AM [facility] [phone] or if no answer [phone]

## 2016-01-03 NOTE — Progress Notes (Signed)
Pt still has not been able to tolerate FSV. Her mean airway pressures ramp up to the 40's (42-45), her PIP elevate to the upper 50's. Pt is stable with it on FS but she just doesn't tolerate it well. Pt follows commands and nodded her head states that she feels fine and that she is not tired or in pain. RT will continue to monitor.

## 2016-01-03 NOTE — Progress Notes (Signed)
Pt is not tolerating FS well tonight. PIP increase, increase RR, pt appearance is stable but just not tolerating FS. Pt PS has been increase to aid in WOB. Pt is stable at this time sat maintained at 98% on 40% +8 PEEP. RN aware of FS event.

## 2016-01-03 NOTE — Progress Notes (Addendum)
Per ARDS protocol decreased Peep to +5 FIO2 is at 40%. Pt placed on wean 5/5

## 2016-01-04 ENCOUNTER — Inpatient Hospital Stay (HOSPITAL_COMMUNITY): Payer: BLUE CROSS/BLUE SHIELD

## 2016-01-04 LAB — GLUCOSE, CAPILLARY
GLUCOSE-CAPILLARY: 120 mg/dL — AB (ref 65–99)
GLUCOSE-CAPILLARY: 129 mg/dL — AB (ref 65–99)
GLUCOSE-CAPILLARY: 130 mg/dL — AB (ref 65–99)
GLUCOSE-CAPILLARY: 91 mg/dL (ref 65–99)
Glucose-Capillary: 115 mg/dL — ABNORMAL HIGH (ref 65–99)
Glucose-Capillary: 104 mg/dL — ABNORMAL HIGH (ref 65–99)

## 2016-01-04 LAB — BASIC METABOLIC PANEL
ANION GAP: 7 (ref 5–15)
BUN: 26 mg/dL — ABNORMAL HIGH (ref 6–20)
CALCIUM: 8.9 mg/dL (ref 8.9–10.3)
CO2: 33 mmol/L — ABNORMAL HIGH (ref 22–32)
Chloride: 104 mmol/L (ref 101–111)
Creatinine, Ser: 0.5 mg/dL (ref 0.44–1.00)
GLUCOSE: 130 mg/dL — AB (ref 65–99)
POTASSIUM: 4 mmol/L (ref 3.5–5.1)
Sodium: 144 mmol/L (ref 135–145)

## 2016-01-04 LAB — CBC
HEMATOCRIT: 34.1 % — AB (ref 36.0–46.0)
HEMOGLOBIN: 10 g/dL — AB (ref 12.0–15.0)
MCH: 28.7 pg (ref 26.0–34.0)
MCHC: 29.3 g/dL — ABNORMAL LOW (ref 30.0–36.0)
MCV: 98 fL (ref 78.0–100.0)
Platelets: 585 10*3/uL — ABNORMAL HIGH (ref 150–400)
RBC: 3.48 MIL/uL — AB (ref 3.87–5.11)
RDW: 15.2 % (ref 11.5–15.5)
WBC: 12 10*3/uL — ABNORMAL HIGH (ref 4.0–10.5)

## 2016-01-04 MED ORDER — FUROSEMIDE 10 MG/ML IJ SOLN
40.0000 mg | Freq: Once | INTRAMUSCULAR | Status: AC
  Administered 2016-01-04: 40 mg via INTRAVENOUS
  Filled 2016-01-04: qty 4

## 2016-01-04 MED ORDER — IPRATROPIUM-ALBUTEROL 0.5-2.5 (3) MG/3ML IN SOLN
3.0000 mL | Freq: Three times a day (TID) | RESPIRATORY_TRACT | Status: DC
  Filled 2016-01-04: qty 3

## 2016-01-04 NOTE — Progress Notes (Signed)
PULMONARY / CRITICAL CARE MEDICINE   Name: [name] MRN: [id number] DOB: Feb 08, 1956    ADMISSION DATE:  12/27/2015 CONSULTATION DATE:  12/28/2015  REFERRING MD:  RAI  CHIEF COMPLAINT:  SOB  SUBJECTIVE:  Tolerating pressure support well this morning Strong cough Fully awake and interacting  VITAL SIGNS: BP 127/57 mmHg  Pulse 88  Temp(Src) 98.9 F (37.2 C) (Axillary)  Resp 9  Ht 5\' 9"  (1.753 m)  Wt 104.9 kg (231 lb 4.2 oz)  BMI 34.14 kg/m2  SpO2 98%  LMP   VENTILATOR SETTINGS: Vent Mode:  [-] CPAP;PSV FiO2 (%):  [40 %] 40 % Set Rate:  [24 bmp] 24 bmp Vt Set:  [400 mL] 400 mL PEEP:  [5 cmH20] 5 cmH20 Pressure Support:  [5 cmH20] 5 cmH20 Plateau Pressure:  [17 cmH20-20 cmH20] 20 cmH20  INTAKE / OUTPUT: I/O last 3 completed shifts: In: 3461.3 [I.V.:511.3; NG/GT:2600; IV Piggyback:350] Out: 5000 [Urine:5000]  PHYSICAL EXAMINATION: General: awake, overweight woman, mechanically ventilated Neuro: RASS 0, opens eyes to voice, nods to questions and moves all ext HEENT: ETT in place Cardiac: regular Chest: b/l bronchial breath sounds, no wheeze Abd: soft, non tender Ext: 1+ edema Skin: no rashes  LABS:  BMET  Recent Labs Lab 01/02/16 0450 01/03/16 0520 01/04/16 0515  NA 145 144 144  K 3.9 4.1 4.0  CL 107 104 104  CO2 32 33* 33*  BUN 24* 24* 26*  CREATININE 0.50 0.49 0.50  GLUCOSE 130* 160* 130*    Electrolytes  Recent Labs Lab 01/01/16 0410 01/02/16 0450 01/03/16 0520 01/04/16 0515  CALCIUM 8.1* 8.1* 8.8* 8.9  MG 2.2  --   --   --     CBC  Recent Labs Lab 01/02/16 0450 01/03/16 0520 01/04/16 0515  WBC 15.5* 14.2* 12.0*  HGB 9.5* 10.0* 10.0*  HCT 33.2* 33.3* 34.1*  PLT 488* 587* 585*    Coag's No results for input(s): APTT, INR in the last 168 hours.  Sepsis Markers  Recent Labs Lab 12/28/15 1624 12/28/15 1921  LATICACIDVEN 1.5 1.3    ABG  Recent Labs Lab 12/29/15 0430 12/30/15 0445 12/31/15 0340  PHART 7.379  7.413 7.439  PCO2ART 44.4 43.4 43.7  PO2ART 90.5 150* 66.6*    Liver Enzymes No results for input(s): AST, ALT, ALKPHOS, BILITOT, ALBUMIN in the last 168 hours.  Cardiac Enzymes No results for input(s): TROPONINI, PROBNP in the last 168 hours.  Glucose  Recent Labs Lab 01/03/16 1150 01/03/16 1532 01/03/16 1944 01/03/16 2319 01/04/16 0335 01/04/16 0751  GLUCAP 122* 140* 133* 114* 130* 120*    Imaging Dg Chest Port 1 View  01/04/2016  CLINICAL DATA:  Acute respiratory failure. EXAM: PORTABLE CHEST 1 VIEW COMPARISON:  January 03, 2016. FINDINGS: Stable cardiomediastinal silhouette. Stable support apparatus. No pneumothorax is noted. Stable large right basilar opacity is noted concerning for pneumonia or atelectasis with associated pleural effusion. Stable mild left basilar opacity is noted concerning for pneumonia or atelectasis with associated pleural effusion. IMPRESSION: Stable support apparatus. Stable bibasilar opacities, right greater than left. Electronically Signed   By: Marijo Conception, M.D.   On: 01/04/2016 07:45     STUDIES:  3/9 CTA chest > There is no evidence of pulmonary embolus. Mild mediastinal and right hilar adenopathy probable reactive. No aortic aneurysm. Extensive infiltrate/consolidation in right lower lobe right perihilar and right middle lobe with air bronchogram highly suspicious for pneumonia. There is patchy geographic airspace opacification bilateral upper lobes. Findings highly suspicious  for evolving pneumonia. Less likely ARDS or pulmonary edema. Trace right pleural effusion and some posterior pleural thickening. Mild pectus excavatum deformity in right midline lower chest anteriorly.  CULTURES: FLU PCR negative BCx2 3/8 >>> negative Urine strep anitgen 3/9 > negative Urine legionella 3/9 > not collected RVP 3/9 > all negative  ANTIBIOTICS: Ceftriaxone 3/8 > Azithromycin 3/8 > Tamiflu  3/9 > 3/14  SIGNIFICANT EVENTS: 3/8 admit 3/9 BiPAP to  ICU, intubated , ards protocol  LINES/TUBES: 3/09 ETT >> 3/09 Lt IJ CVL >> 3/10 Rt radial aline >>  DISCUSSION: 60 year old female with no significant PMH presenting with flu-like symptoms for one week. Hypoxemic and febrile in ED. Hypoxemia and dyspnea worsened requiring BiPAP 3/9. Moved to ICU. Likely CAP vs viral PNA.   ASSESSMENT / PLAN:  PULMONARY A: Acute hypoxic respiratory failure 2nd to PNA with ARDS. Chest x-ray 3/15 with some slight basilar clearing compare with prior Hx of tobacco abuse. P:   Tolerating pressure port. I believe that she meets criteria for extubation today 3/16 continue pulmonary hygiene F/u CXR duoneb   CARDIOVASCULAR A:  Septic shock. P:  Pressors weaned off 3/13 CVP goal now that BP stabilized to 5-7 Antibiotics as below   RENAL A:   Hypokalemia, resolved Hypernatremia P:   Correct electrolytes as indicated Added free water 3/13 continue gentle diuresis on 3/15  GASTROINTESTINAL A:   Nutrition. P:   PPI for SUP Tube feeds while on vent  HEMATOLOGIC A:   Anemia of critical illness. P:  Trend CBC Lovenox for DVT prevention  INFECTIOUS A:   Septic shock from CA pneumonia. Viral panel negative P:   Day 8/10 of rocephin, zithromax Stopped tamiflu 3/14   ENDOCRINE A:   Hyperglycemia. P:   SSI protocol  NEUROLOGIC A:   Acute septic encephalopathy. Elevated triglyceride from propofol >> d/c'ed 3/12. History depression P:   RASS goal  0 Fentanyl gtt with prn versed, weaning to off with goal extubation 3/16 Paxil and BuSpar   CC time 35 minutes.   Baltazar Apo, MD, PhD 01/04/2016, 9:28 AM [facility] [phone] or if no answer [phone]

## 2016-01-04 NOTE — Procedures (Signed)
Extubation Procedure Note  Patient Details:   Name: [name] DOB: 08-06-1956 MRN: [id number]   Airway Documentation:     Evaluation  O2 sats: stable throughout Complications: No apparent complications Patient did tolerate procedure well. Bilateral Breath Sounds: Diminished Suctioning: Oral, Airway Yes   Patient extubated to 4 LNC at this time per MD order. Positive cuff leak. Patient able to vocalize and clear secretions. RT will continue to monitor  Saunders Glance 01/04/2016, 9:37 AM

## 2016-01-05 LAB — BASIC METABOLIC PANEL
ANION GAP: 10 (ref 5–15)
BUN: 25 mg/dL — ABNORMAL HIGH (ref 6–20)
CHLORIDE: 103 mmol/L (ref 101–111)
CO2: 31 mmol/L (ref 22–32)
Calcium: 9.1 mg/dL (ref 8.9–10.3)
Creatinine, Ser: 0.58 mg/dL (ref 0.44–1.00)
GFR calc non Af Amer: >60 mL/min (ref >60)
Glucose, Bld: 102 mg/dL — ABNORMAL HIGH (ref 65–99)
POTASSIUM: 4.1 mmol/L (ref 3.5–5.1)
Sodium: 144 mmol/L (ref 135–145)

## 2016-01-05 LAB — GLUCOSE, CAPILLARY
GLUCOSE-CAPILLARY: 82 mg/dL (ref 65–99)
GLUCOSE-CAPILLARY: 104 mg/dL — AB (ref 65–99)
Glucose-Capillary: 94 mg/dL (ref 65–99)

## 2016-01-05 MED ORDER — DOCUSATE SODIUM 100 MG PO CAPS
100.0000 mg | ORAL_CAPSULE | Freq: Two times a day (BID) | ORAL | Status: DC | PRN
  Administered 2016-01-05: 100 mg via ORAL
  Filled 2016-01-05: qty 1

## 2016-01-05 MED ORDER — DOCUSATE SODIUM 50 MG/5ML PO LIQD: 100.0000 mg | Freq: Two times a day (BID) | ORAL | Status: DC | PRN

## 2016-01-05 MED ORDER — MAGIC MOUTHWASH
10.0000 mL | Freq: Three times a day (TID) | ORAL | Status: DC | PRN
  Administered 2016-01-06 (×2): 10 mL via ORAL
  Filled 2016-01-05 (×2): qty 10

## 2016-01-05 MED ORDER — PANTOPRAZOLE SODIUM 40 MG PO PACK: 40.0000 mg | PACK | ORAL | Status: DC

## 2016-01-05 MED ORDER — IPRATROPIUM-ALBUTEROL 0.5-2.5 (3) MG/3ML IN SOLN: 3.0000 mL | Freq: Four times a day (QID) | RESPIRATORY_TRACT | Status: DC | PRN

## 2016-01-05 MED ORDER — DOCUSATE SODIUM 100 MG PO CAPS: 100.0000 mg | ORAL_CAPSULE | Freq: Two times a day (BID) | ORAL | Status: DC

## 2016-01-05 MED ORDER — PANTOPRAZOLE SODIUM 40 MG PO TBEC
40.0000 mg | DELAYED_RELEASE_TABLET | Freq: Every day | ORAL | Status: DC
  Administered 2016-01-05 – 2016-01-07 (×3): 40 mg via ORAL
  Filled 2016-01-05 (×3): qty 1

## 2016-01-05 NOTE — Progress Notes (Signed)
Nutrition Follow-up  DOCUMENTATION CODES:   Obesity unspecified  INTERVENTION:   Encourage PO intake at meals and snacks  NUTRITION DIAGNOSIS:   Inadequate oral intake related to inability to eat as evidenced by NPO status. Progressing  GOAL:   Patient will meet greater than or equal to 90% of their needs Progressing  MONITOR:   PO intake, I & O's  ASSESSMENT:   60 year old female with no significant PMH presenting with flu-like symptoms for one week. Hypoxemic and febrile in ED. Hypoxemia and dyspnea worsened requiring BiPAP 3/9. Moved to ICU. Likely CAP vs viral PNA.   3/16 extubated 3/17 started PO diet Per pt and husband pt had good appetite PTA and no recent weight changes. Pt declines supplements Primary concern is that she has not had a bm in 8 days. RN aware.   CBG's: 82-129  Diet Order:  Diet Heart Room service appropriate?: Yes; Fluid consistency:: Thin  Skin:  Reviewed, no issues  Last BM:  3/8  Height:   Ht Readings from Last 1 Encounters:  12/27/15 5\' 9"  (1.753 m)    Weight:   Wt Readings from Last 1 Encounters:  01/04/16 231 lb 4.2 oz (104.9 kg)    Ideal Body Weight:  65.9 kg  BMI:  Body mass index is 34.14 kg/(m^2).  Estimated Nutritional Needs:   Kcal:  1700-1900  Protein:  85-100 grams  Fluid:  > 1.7 L/day  EDUCATION NEEDS:   No education needs identified at this time  [place], [place], [place] Pager [phone] After Hours Pager

## 2016-01-05 NOTE — Evaluation (Signed)
Occupational Therapy Evaluation Patient Details Name: [name] MRN: [id number] DOB: 1956/02/25 Today's Date: 01/05/2016    History of Present Illness This 60 y.o. female admitted with flu like symptoms.  She was hypoxic and febrile.  Pt developed ARF and was intubated 3/9 - 01/04/16.  Dx:  septic shock due to PNA.  PMH includes: anxiety/depression, CAP, tobacco use     Clinical Impression   Pt admitted with above. She demonstrates the below listed deficits and will benefit from continued OT to maximize safety and independence with BADLs.  Pt presents to OT with generalized weakness and deconditioning.  She requires mod A overall, for ADLs.  She is very motivated.  Anticipate good progress.  She should be able to discharge home with spouse and no follow up OT.       Follow Up Recommendations  No OT follow up;Supervision/Assistance - 24 hour    Equipment Recommendations  3 in 1 bedside comode    Recommendations for Other Services       Precautions / Restrictions Precautions Precautions: Fall      Mobility Bed Mobility Overal bed mobility: Needs Assistance Bed Mobility: Supine to Sit     Supine to sit: Min assist;HOB elevated        Transfers Overall transfer level: Needs assistance Equipment used: 1 person hand held assist Transfers: Sit to/from [facility] Sit to Stand: Min assist;+2 physical assistance Stand pivot transfers: Min assist;+2 safety/equipment       General transfer comment: Pt requires assist to move into standing and assist to steady due to impaired balance     Balance Overall balance assessment: Needs assistance Sitting-balance support: Feet supported Sitting balance-Leahy Scale: Fair Sitting balance - Comments: Pt requires UE support to prevent LOB    Standing balance support: Single extremity supported Standing balance-Leahy Scale: Poor Standing balance comment: requires min A and UE support                              ADL Overall ADL's : Needs assistance/impaired Eating/Feeding: Independent;Sitting   Grooming: Wash/dry hands;Wash/dry face;Oral care;Brushing hair;Set up;Sitting;Bed level   Upper Body Bathing: Minimal assitance;Sitting   Lower Body Bathing: Moderate assistance;Sit to/from stand   Upper Body Dressing : Minimal assistance;Sitting   Lower Body Dressing: Moderate assistance;Sit to/from stand   Toilet Transfer: Minimal assistance;+2 for safety/equipment;Ambulation;Regular Toilet;Grab bars;RW;BSC;Comfort height toilet   Toileting- Clothing Manipulation and Hygiene: Moderate assistance;Sit to/from stand       Functional mobility during ADLs: Minimal assistance;+2 for safety/equipment General ADL Comments: Pt very motivated.  She fatigues with activity      Vision     Perception     Praxis      Pertinent Vitals/Pain Pain Assessment: No/denies pain     Hand Dominance Right   Extremity/Trunk Assessment Upper Extremity Assessment Upper Extremity Assessment: Generalized weakness   Lower Extremity Assessment Lower Extremity Assessment: Defer to PT evaluation   Cervical / Trunk Assessment Cervical / Trunk Assessment: Normal   Communication Communication Communication: No difficulties   Cognition Arousal/Alertness: Awake/alert Behavior During Therapy: WFL for tasks assessed/performed Overall Cognitive Status: Within Functional Limits for tasks assessed                     General Comments       Exercises Exercises: Other exercises Other Exercises Other Exercises: Pt instructed in LE and UE exercises to perform    Shoulder Instructions  Home Living Family/patient expects to be discharged to:: Private residence Living Arrangements: Spouse/significant other Available Help at Discharge: Family;Available 24 hours/day Type of Home: House Home Access: Stairs to enter [employer] of Steps: 1   Home Layout: One level      Bathroom Shower/Tub: Tub/shower unit;Curtain Shower/tub characteristics: Architectural technologist: Standard     Home Equipment: Environmental consultant - 2 wheels;Cane - single point;Wheelchair - manual          Prior Functioning/Environment Level of Independence: Independent             OT Diagnosis: Generalized weakness   OT Problem List: Decreased strength;Decreased activity tolerance;Impaired balance (sitting and/or standing);Decreased knowledge of use of DME or AE;Decreased knowledge of precautions;Cardiopulmonary status limiting activity   OT Treatment/Interventions: Self-care/ADL training;DME and/or AE instruction;Therapeutic exercise;Therapeutic activities;Energy conservation;Patient/family education;Balance training    OT Goals(Current goals can be found in the care plan section) Acute Rehab OT Goals Patient Stated Goal: to return to normal OT Goal Formulation: With patient/family Time For Goal Achievement: 01/19/16 Potential to Achieve Goals: Good ADL Goals Pt Will Perform Grooming: with supervision;standing Pt Will Perform Upper Body Bathing: with set-up;sitting Pt Will Perform Lower Body Bathing: with supervision;sit to/from stand Pt Will Perform Upper Body Dressing: with set-up;sitting Pt Will Perform Lower Body Dressing: with supervision;sit to/from stand Pt Will Transfer to Toilet: ambulating;bedside commode;regular height toilet;grab bars;with supervision Pt Will Perform Toileting - Clothing Manipulation and hygiene: with supervision;sit to/from stand Pt Will Perform Tub/Shower Transfer: Tub transfer;with min guard assist;ambulating;shower seat;3 in 1  OT Frequency: Min 2X/week   Barriers to D/C:            Co-evaluation PT/OT/SLP Co-Evaluation/Treatment: Yes Reason for Co-Treatment: For patient/therapist safety   OT goals addressed during session: ADL's and self-care;Strengthening/ROM      End of Session Equipment Utilized During Treatment: Rolling walker;Gait  belt Nurse Communication: Mobility status  Activity Tolerance: Patient tolerated treatment well Patient left: in chair;with call bell/phone within reach;with family/visitor present   Time: 1131-1152 OT Time Calculation (min): 21 min Charges:  OT General Charges $OT Visit: 1 Procedure OT Evaluation $OT Eval Moderate Complexity: 1 Procedure G-Codes:    Lucille Passy M 01/22/16, 12:49 PM

## 2016-01-05 NOTE — Progress Notes (Signed)
[name] [id number] Admission Data: 01/05/2016 4:26 PM Attending Provider: Rush Landmark, MD  [id number] C, MD Consults/ Treatment Team:    Debra GREUNKE is a 60 y.o. female patient transfer from 28M,  Full Code, VSS - Blood pressure 95/45, pulse 70, temperature 98.7 F (37.1 C), temperature source Oral, resp. rate 18, height 5\' 9"  (1.753 m), weight 104.9 kg (231 lb 4.2 oz), SpO2 92 %., room air, no c/o shortness of breath, no c/o chest pain, no distress noted.    IV site WDL: left forearm and left AC with a transparent dsg that's clean dry and intact.  Allergies:  No Known Allergies   Past Medical History  Diagnosis Date  . Anxiety   . CAP (community acquired pneumonia)   . Depression   . Tobacco use     Pt orientation to unit, room and routine. Information packet given to patient/family and safety video watched.  Admission INP armband ID verified with patient/family, and in place. SR up x 2, fall risk assessment complete with Patient and family verbalizing understanding of risks associated with falls. Pt verbalizes an understanding of how to use the call bell and to call for help before getting out of bed.  Skin, clean-dry- intact without evidence of bruising, or skin tears.   No evidence of skin break down noted on exam. Skin assessment done with Cyndi RN.   Pt is up in chair with chair alarm on. Family members in room with patient.  Call bell within reach. Will cont to monitor and assist as needed.  Debra Fray, RN 01/05/2016 4:26 PM

## 2016-01-05 NOTE — Progress Notes (Signed)
PULMONARY / CRITICAL CARE MEDICINE   Name: ARUNA BERARDUCCI MRN: [id number] DOB: 1956-08-19    ADMISSION DATE:  12/27/2015 CONSULTATION DATE:  12/28/2015  REFERRING MD:  RAI  CHIEF COMPLAINT:  SOB  SUBJECTIVE:  Doing well post-extubation, hungry Some cough C/o some mouth pain, ? ulcer  VITAL SIGNS: BP 102/47 mmHg  Pulse 60  Temp(Src) 98.4 F (36.9 C) (Oral)  Resp 23  Ht 5\' 9"  (1.753 m)  Wt 104.9 kg (231 lb 4.2 oz)  BMI 34.14 kg/m2  SpO2 93%  LMP   VENTILATOR SETTINGS:    INTAKE / OUTPUT: I/O last 3 completed shifts: In: 1897.5 [I.V.:397.5; NG/GT:1150; IV Piggyback:350] Out: [id number] [Urine:3950]  PHYSICAL EXAMINATION: General: awake, overweight woman, mechanically ventilated Neuro: RASS 0, A&O x 3, non-focal HEENT: OP clear, dry Cardiac: regular Chest: b/l bronchial breath sounds, no wheeze Abd: soft, non tender Ext: 1+ edema Skin: no rashes  LABS:  BMET  Recent Labs Lab 01/03/16 0520 01/04/16 0515 01/05/16 0545  NA 144 144 144  K 4.1 4.0 4.1  CL 104 104 103  CO2 33* 33* 31  BUN 24* 26* 25*  CREATININE 0.49 0.50 0.58  GLUCOSE 160* 130* 102*    Electrolytes  Recent Labs Lab 01/01/16 0410  01/03/16 0520 01/04/16 0515 01/05/16 0545  CALCIUM 8.1*  < > 8.8* 8.9 9.1  MG 2.2  --   --   --   --   < > = values in this interval not displayed.  CBC  Recent Labs Lab 01/02/16 0450 01/03/16 0520 01/04/16 0515  WBC 15.5* 14.2* 12.0*  HGB 9.5* 10.0* 10.0*  HCT 33.2* 33.3* 34.1*  PLT 488* 587* 585*    Coag's No results for input(s): APTT, INR in the last 168 hours.  Sepsis Markers No results for input(s): LATICACIDVEN, PROCALCITON, O2SATVEN in the last 168 hours.  ABG  Recent Labs Lab 12/30/15 0445 12/31/15 0340  PHART 7.413 7.439  PCO2ART 43.4 43.7  PO2ART 150* 66.6*    Liver Enzymes No results for input(s): AST, ALT, ALKPHOS, BILITOT, ALBUMIN in the last 168 hours.  Cardiac Enzymes No results for input(s): TROPONINI, PROBNP in the  last 168 hours.  Glucose  Recent Labs Lab 01/04/16 1212 01/04/16 1628 01/04/16 1934 01/04/16 2334 01/05/16 0342 01/05/16 0730  GLUCAP 115* 104* 129* 91 94 82    Imaging No results found.   STUDIES:  3/9 CTA chest > There is no evidence of pulmonary embolus. Mild mediastinal and right hilar adenopathy probable reactive. No aortic aneurysm. Extensive infiltrate/consolidation in right lower lobe right perihilar and right middle lobe with air bronchogram highly suspicious for pneumonia. There is patchy geographic airspace opacification bilateral upper lobes. Findings highly suspicious for evolving pneumonia. Less likely ARDS or pulmonary edema. Trace right pleural effusion and some posterior pleural thickening. Mild pectus excavatum deformity in right midline lower chest anteriorly.  CULTURES: FLU PCR negative BCx2 3/8 >>> negative Urine strep anitgen 3/9 > negative Urine legionella 3/9 > not collected RVP 3/9 > all negative  ANTIBIOTICS: Ceftriaxone 3/8 > Azithromycin 3/8 > Tamiflu  3/9 > 3/14  SIGNIFICANT EVENTS: 3/8 admit 3/9 BiPAP to ICU, intubated , ards protocol  LINES/TUBES: 3/09 ETT >> 3/09 Lt IJ CVL >> 3/10 Rt radial aline >>  DISCUSSION: 60 year old female with no significant PMH presenting with flu-like symptoms for one week. Hypoxemic and febrile in ED. Hypoxemia and dyspnea worsened requiring BiPAP 3/9. Moved to ICU. Likely CAP vs viral PNA.   ASSESSMENT /  PLAN:  PULMONARY A: Acute hypoxic respiratory failure 2nd to PNA with ARDS. Chest x-ray 3/15 with some slight basilar clearing compare with prior Hx of tobacco abuse. P:   continue pulmonary hygiene F/u CXR duoneb changed to prn  CARDIOVASCULAR A:  Septic shock, resolved P:  Pressors weaned off 3/13 Antibiotics as below   RENAL A:   Hypokalemia, resolved Hypernatremia P:   Correct electrolytes as indicated Hold diuresis on 3/17  GASTROINTESTINAL A:   Nutrition. P:   PPI for  SUP PO diet 3/17  HEMATOLOGIC A:   Anemia of critical illness. P:  Trend CBC Lovenox for DVT prevention  INFECTIOUS A:   Septic shock from CA pneumonia. Viral panel negative P:   Day 9/10 of rocephin, zithromax Stopped tamiflu 3/14   ENDOCRINE A:   Hyperglycemia. P:   SSI protocol  NEUROLOGIC A:   Acute septic encephalopathy. Elevated triglyceride from propofol >> d/c'ed 3/12. History depression P:   RASS goal  0 Paxil and BuSpar     Baltazar Apo, MD, PhD 01/05/2016, 9:23 AM [facility] [phone] or if no answer [phone]

## 2016-01-05 NOTE — Evaluation (Signed)
Physical Therapy Evaluation Patient Details Name: NANCEY PADDEN MRN: [id number] DOB: February 10, 1956 Today's Date: 01/05/2016   History of Present Illness  This 60 y.o. female admitted with flu like symptoms.  She was hypoxic and febrile.  Pt developed ARF and was intubated 3/9 - 01/04/16.  Dx:  septic shock due to PNA.  PMH includes: anxiety/depression, CAP, tobacco use    Clinical Impression  Pt very motivated to improve overall mobility to return to baseline and facilitate D/C to home.  Pt globally weak with impaired balance, but encouraged increased activity throughout the day and Bil UE and LE exercises throughout the day.  Will continue to follow while on acute.      Follow Up Recommendations Home health PT;Supervision/Assistance - 24 hour    Equipment Recommendations  Rolling walker with 5" wheels    Recommendations for Other Services       Precautions / Restrictions Precautions Precautions: Fall Restrictions Weight Bearing Restrictions: No      Mobility  Bed Mobility Overal bed mobility: Needs Assistance Bed Mobility: Supine to Sit     Supine to sit: Min assist;HOB elevated     General bed mobility comments: pt needs increased time and A for bringing hips closer to EOB.  Transfers Overall transfer level: Needs assistance Equipment used: 1 person hand held assist Transfers: Sit to/from Stand Sit to Stand: Min assist;+2 physical assistance Stand pivot transfers: Min assist;+2 safety/equipment       General transfer comment: Pt requires assist to move into standing and assist to steady due to impaired balance   Ambulation/Gait Ambulation/Gait assistance: Min assist Ambulation Distance (Feet): 25 Feet Assistive device: 1 person hand held assist Gait Pattern/deviations: Step-through pattern;Decreased stride length     General Gait Details: pt generally weak and unsteady.  pt utilizes single UE support on IV pole, but may benefit from AD next session.     Stairs            Wheelchair Mobility    Modified Rankin (Stroke Patients Only)       Balance Overall balance assessment: Needs assistance Sitting-balance support: Feet supported;Single extremity supported Sitting balance-Leahy Scale: Fair Sitting balance - Comments: Pt requires UE support to prevent LOB    Standing balance support: Single extremity supported;During functional activity Standing balance-Leahy Scale: Poor Standing balance comment: requires min A and UE support                              Pertinent Vitals/Pain Pain Assessment: No/denies pain    Home Living Family/patient expects to be discharged to:: Private residence Living Arrangements: Spouse/significant other Available Help at Discharge: Family;Available 24 hours/day Type of Home: House Home Access: Stairs to enter   [employer] of Steps: 1 Home Layout: One level Home Equipment: Walker - 2 wheels;Cane - single point;Wheelchair - manual      Prior Function Level of Independence: Independent               Hand Dominance   Dominant Hand: Right    Extremity/Trunk Assessment   Upper Extremity Assessment: Defer to OT evaluation           Lower Extremity Assessment: Generalized weakness      Cervical / Trunk Assessment: Normal  Communication   Communication: No difficulties  Cognition Arousal/Alertness: Awake/alert Behavior During Therapy: WFL for tasks assessed/performed Overall Cognitive Status: Within Functional Limits for tasks assessed  General Comments General comments (skin integrity, edema, etc.): O2 sats remained 91-92% on RA throughout session.      Exercises Other Exercises Other Exercises: Pt instructed in LE and UE exercises to perform       Assessment/Plan    PT Assessment Patient needs continued PT services  PT Diagnosis Difficulty walking;Generalized weakness   PT Problem List Decreased  strength;Decreased activity tolerance;Decreased balance;Decreased mobility;Decreased knowledge of use of DME  PT Treatment Interventions DME instruction;Gait training;Stair training;Functional mobility training;Therapeutic activities;Therapeutic exercise;Balance training;Patient/family education   PT Goals (Current goals can be found in the Care Plan section) Acute Rehab PT Goals Patient Stated Goal: to return to normal PT Goal Formulation: With patient Time For Goal Achievement: 01/19/16 Potential to Achieve Goals: Good    Frequency Min 3X/week   Barriers to discharge        Co-evaluation PT/OT/SLP Co-Evaluation/Treatment: Yes Reason for Co-Treatment: For patient/therapist safety PT goals addressed during session: Mobility/safety with mobility;Balance OT goals addressed during session: ADL's and self-care;Strengthening/ROM       End of Session Equipment Utilized During Treatment: Gait belt Activity Tolerance: Patient tolerated treatment well Patient left: in chair;with call bell/phone within reach;with family/visitor present Nurse Communication: Mobility status         Time: [id number] PT Time Calculation (min) (ACUTE ONLY): 23 min   Charges:   PT Evaluation $PT Eval Moderate Complexity: 1 Procedure     PT G CodesCatarina Hartshorn, [place] [phone] 01/05/2016, 2:29 PM

## 2016-01-05 NOTE — Progress Notes (Signed)
SLP Cancellation Note  Patient Details Name: [name] MRN: [id number] DOB: 23-Jun-1956   Cancelled treatment:       Reason Eval/Treat Not Completed: RN reports that MD will start PO diet this morning and that he does not want SLP swallow evaluation. Please note that prolonged intubation can increase risk for silent aspiration. Will sign off at this time - please re-order SLP if needed.   Germain Osgood, M.A. CCC-SLP [phone]  Germain Osgood 01/05/2016, 9:11 AM

## 2016-01-06 MED ORDER — DOCUSATE SODIUM 100 MG PO CAPS
100.0000 mg | ORAL_CAPSULE | Freq: Two times a day (BID) | ORAL | Status: DC
  Administered 2016-01-07: 100 mg via ORAL
  Filled 2016-01-06 (×2): qty 1

## 2016-01-06 MED ORDER — BISACODYL 10 MG RE SUPP
10.0000 mg | Freq: Every day | RECTAL | Status: DC | PRN
  Administered 2016-01-06: 10 mg via RECTAL

## 2016-01-06 NOTE — Progress Notes (Signed)
Pt ambulated 380 feet using walker on RA. Pt denied SOB and tolerated well. Walking oximetry done. On RA, oxygen saturation remained 94-95%. After ambulation, pt back in bed at lowest position and call bell within reach. Pt states, "I feel so much better today". Will continue to monitor pt.

## 2016-01-06 NOTE — Progress Notes (Signed)
PULMONARY / CRITICAL CARE MEDICINE   Name: [name] MRN: [id number] DOB: Jun 02, 1956    ADMISSION DATE:  12/27/2015 CONSULTATION DATE:  12/28/2015  REFERRING MD:  RAI  CHIEF COMPLAINT:  SOB  SUBJECTIVE:  Feeling better, was able to ambulate in hall Taking good PO Has not had a BM Still with some sores in her OP from the ETT  VITAL SIGNS: BP 97/50 mmHg  Pulse 77  Temp(Src) 97.9 F (36.6 C) (Oral)  Resp 16  Ht 5\' 9"  (1.753 m)  Wt 105.235 kg (232 lb)  BMI 34.24 kg/m2  SpO2 94%  LMP   VENTILATOR SETTINGS:    INTAKE / OUTPUT: I/O last 3 completed shifts: In: 230.5 [I.V.:180.5; IV Piggyback:50] Out: 1200 [Urine:1200]  PHYSICAL EXAMINATION: General: well-appearing woman, NAD Neuro: A&O x 3, non-focal HEENT: OP clear, dry Cardiac: regular Chest: few R basilar crackles otherwise clear Abd: soft, non tender Ext: 1+ edema Skin: no rashes  LABS:  BMET  Recent Labs Lab 01/03/16 0520 01/04/16 0515 01/05/16 0545  NA 144 144 144  K 4.1 4.0 4.1  CL 104 104 103  CO2 33* 33* 31  BUN 24* 26* 25*  CREATININE 0.49 0.50 0.58  GLUCOSE 160* 130* 102*    Electrolytes  Recent Labs Lab 01/01/16 0410  01/03/16 0520 01/04/16 0515 01/05/16 0545  CALCIUM 8.1*  < > 8.8* 8.9 9.1  MG 2.2  --   --   --   --   < > = values in this interval not displayed.  CBC  Recent Labs Lab 01/02/16 0450 01/03/16 0520 01/04/16 0515  WBC 15.5* 14.2* 12.0*  HGB 9.5* 10.0* 10.0*  HCT 33.2* 33.3* 34.1*  PLT 488* 587* 585*    Coag's No results for input(s): APTT, INR in the last 168 hours.  Sepsis Markers No results for input(s): LATICACIDVEN, PROCALCITON, O2SATVEN in the last 168 hours.  ABG  Recent Labs Lab 12/31/15 0340  PHART 7.439  PCO2ART 43.7  PO2ART 66.6*    Liver Enzymes No results for input(s): AST, ALT, ALKPHOS, BILITOT, ALBUMIN in the last 168 hours.  Cardiac Enzymes No results for input(s): TROPONINI, PROBNP in the last 168  hours.  Glucose  Recent Labs Lab 01/04/16 1628 01/04/16 1934 01/04/16 2334 01/05/16 0342 01/05/16 0730 01/05/16 1153  GLUCAP 104* 129* 91 94 82 104*    Imaging No results found.   STUDIES:  3/9 CTA chest > There is no evidence of pulmonary embolus. Mild mediastinal and right hilar adenopathy probable reactive. No aortic aneurysm. Extensive infiltrate/consolidation in right lower lobe right perihilar and right middle lobe with air bronchogram highly suspicious for pneumonia. There is patchy geographic airspace opacification bilateral upper lobes. Findings highly suspicious for evolving pneumonia. Less likely ARDS or pulmonary edema. Trace right pleural effusion and some posterior pleural thickening. Mild pectus excavatum deformity in right midline lower chest anteriorly.  CULTURES: FLU PCR negative BCx2 3/8 >>> negative Urine strep anitgen 3/9 > negative Urine legionella 3/9 > not collected RVP 3/9 > all negative  ANTIBIOTICS: Ceftriaxone 3/8 > Azithromycin 3/8 > Tamiflu  3/9 > 3/14  SIGNIFICANT EVENTS: 3/8 admit 3/9 BiPAP to ICU, intubated , ards protocol  LINES/TUBES: 3/09 ETT >> 3/09 Lt IJ CVL >> 3/10 Rt radial aline >>  DISCUSSION: 60 year old female with no significant PMH presenting with flu-like symptoms for one week. Hypoxemic and febrile in ED. Hypoxemia and dyspnea worsened requiring BiPAP 3/9. Moved to ICU. Likely CAP vs viral PNA.  ASSESSMENT / PLAN:  PULMONARY A: Acute hypoxic respiratory failure 2nd to PNA with ARDS. Chest x-ray 3/15 with some slight basilar clearing compare with prior Hx of tobacco abuse. P:   continue pulmonary hygiene F/u CXR duoneb changed to prn  CARDIOVASCULAR A:  Septic shock, resolved P:  Pressors weaned off 3/13 Antibiotics as below   RENAL A:   Hypokalemia, resolved Hypernatremia P:   Correct electrolytes as indicated Hold diuresis on 3/17  GASTROINTESTINAL A:   Nutrition. Constipation  P:   PPI for  SUP PO diet, tolerating well Start bowel regimen  HEMATOLOGIC A:   Anemia of critical illness. P:  Trend CBC Lovenox for DVT prevention  INFECTIOUS A:   Septic shock from CA pneumonia. Viral panel negative P:   Day 10/10 of rocephin, zithromax; final day abx 3/18 Stopped tamiflu 3/14   ENDOCRINE A:   Hyperglycemia. P:   SSI protocol  NEUROLOGIC A:   Acute septic encephalopathy, resolved Elevated triglyceride from propofol >> d/c'ed 3/12. History depression P:   RASS goal  0 Paxil and BuSpar  Goal home on 3/19 or 3/20.    Baltazar Apo, MD, PhD 01/06/2016, 4:52 PM [facility] [phone] or if no answer [phone]

## 2016-01-07 ENCOUNTER — Inpatient Hospital Stay (HOSPITAL_COMMUNITY): Payer: BLUE CROSS/BLUE SHIELD

## 2016-01-07 LAB — CBC
HEMATOCRIT: 38.5 % (ref 36.0–46.0)
HEMOGLOBIN: 11.8 g/dL — AB (ref 12.0–15.0)
MCH: 28.6 pg (ref 26.0–34.0)
MCHC: 30.6 g/dL (ref 30.0–36.0)
MCV: 93.2 fL (ref 78.0–100.0)
Platelets: 665 10*3/uL — ABNORMAL HIGH (ref 150–400)
RBC: 4.13 MIL/uL (ref 3.87–5.11)
RDW: 15.2 % (ref 11.5–15.5)
WBC: 9.7 10*3/uL (ref 4.0–10.5)

## 2016-01-07 LAB — BASIC METABOLIC PANEL
ANION GAP: 12 (ref 5–15)
BUN: 19 mg/dL (ref 6–20)
CHLORIDE: 101 mmol/L (ref 101–111)
CO2: 25 mmol/L (ref 22–32)
Calcium: 9.3 mg/dL (ref 8.9–10.3)
Creatinine, Ser: 0.7 mg/dL (ref 0.44–1.00)
GFR calc Af Amer: >60 mL/min (ref >60)
Glucose, Bld: 115 mg/dL — ABNORMAL HIGH (ref 65–99)
POTASSIUM: 4.3 mmol/L (ref 3.5–5.1)
SODIUM: 138 mmol/L (ref 135–145)

## 2016-01-07 NOTE — Progress Notes (Signed)
[place] Getting to be D/C'd Home with husband per MD order.  Discussed with the patient and all questions fully answered.  VSS, Skin clean, dry and intact without evidence of skin break down, no evidence of skin tears noted. IV catheter discontinued intact. Site without signs and symptoms of complications. Dressing and pressure applied.  An After Visit Summary was printed and given to the patient. Patient received prescription.  D/c education completed with patient/family including follow up instructions, medication list, d/c activities limitations if indicated, with other d/c instructions as indicated by MD - patient able to verbalize understanding, all questions fully answered.   Patient instructed to return to ED, call 911, or call MD for any changes in condition.   Patient escorted via [place], and D/C home via private auto.  Deri Fuelling 01/07/2016 6:44 PM

## 2016-01-07 NOTE — Discharge Summary (Signed)
Physician Discharge Summary  Patient ID: [name] MRN: [id number] DOB/AGE: December 17, 1955 60 y.o.  Admit date: 12/27/2015 Discharge date: 01/07/2016  Problem List Principal Problem:   Acute respiratory failure with hypoxia ([place]) Active Problems:   CAP (community acquired pneumonia)   Elevated lactic acid level   Hypokalemia   Leukocytosis   Acute respiratory failure (HCC)   Tobacco abuse   Sepsis ([place])   ARDS (adult respiratory distress syndrome) (HCC)  HPI:  [name] is a very pleasant 60 y.o. female with a past medical history that includes anxiety/depression presents to the emergency department from her primary care provider's office with the chief complaint of hypoxia.  Information is obtained from the patient. In days ago developed general body aches joint pain and myalgia. 2 days later she developed fever and slept. She reports feeling better the next day but the day after that fever bodyaches reoccurred. Associated symptoms also include cough chills subjective fever, nausea and one episode of emesis. She denies any coffee ground emesis. By day 4 she felt like she was improved her cough lingered. She then developed shortness of breath with exertion. She denies chest pain palpitation headache dizziness syncope or near-syncope. She denies abdominal pain decreased oral intake lower extremity edema orthopnea diarrhea melena bright red blood per rectum. His report being a smoker  Emergency department max temp 99 hemodynamically stable with hypoxia 89% on room air. Emergency department she is provided with 3 L of normal saline Rocephin and azithromycin as well as oxygen supplementation.  Hospital Course:  STUDIES:  3/9 CTA chest > There is no evidence of pulmonary embolus. Mild mediastinal and right hilar adenopathy probable reactive. No aortic aneurysm. Extensive infiltrate/consolidation in right lower lobe right perihilar and right middle lobe with air bronchogram highly  suspicious for pneumonia. There is patchy geographic airspace opacification bilateral upper lobes. Findings highly suspicious for evolving pneumonia. Less likely ARDS or pulmonary edema. Trace right pleural effusion and some posterior pleural thickening. Mild pectus excavatum deformity in right midline lower chest anteriorly.  CULTURES: FLU PCR negative BCx2 3/8 >>> negative Urine strep anitgen 3/9 > negative Urine legionella 3/9 > not collected RVP 3/9 > all negative  ANTIBIOTICS: Ceftriaxone 3/8 >stopped Azithromycin 3/8 >stopped Tamiflu 3/9 > 3/14  SIGNIFICANT EVENTS: 3/8 admit 3/9 BiPAP to ICU, intubated , ards protocol  LINES/TUBES: 3/09 ETT >>3/17 3/09 Lt IJ CVL >>3/18 3/10 Rt radial aline >>3/17    ASSESSMENT / PLAN:  PULMONARY A: Acute hypoxic respiratory failure 2nd to PNA with ARDS. Chest x-ray 3/15 with some slight basilar clearing compare with prior Hx of tobacco abuse. P:  Intubated and placed on MVS. She was treated with abx and antivirals and was liberated from [facility]. She reached [place] on 3/19 and will be discharged home.She will need to follow up with her PCP within one week and Dr. Lamonte Sakai in 4 - 6 weeks. She is take it easy till she is evaluated by her PCP.    CARDIOVASCULAR A:  Septic shock, resolved P:  Pressors weaned off 3/13 Antibiotics as below   RENAL A:  Hypokalemia, resolved Hypernatremia P:  Correct electrolytes as indicated Hold diuresis on 3/17  GASTROINTESTINAL A:  Nutrition. Constipation  P:  PPI for SUP PO diet, tolerating well Start bowel regimen  HEMATOLOGIC A:  Anemia of critical illness. P:  Trend CBC Lovenox for DVT prevention  INFECTIOUS A:  Septic shock from CA pneumonia. Viral panel negative P:  Day 10/10 of rocephin, zithromax; final  day abx 3/18 Stopped tamiflu 3/14   ENDOCRINE A:  Hyperglycemia. P:  SSI protocol  NEUROLOGIC A:  Acute septic encephalopathy,  resolved Elevated triglyceride from propofol >> d/c'ed 3/12. History depression P:  RASS goal 0 Paxil and BuSpar   Labs at discharge Lab Results  Component Value Date   CREATININE 0.70 01/07/2016   BUN 19 01/07/2016   NA 138 01/07/2016   K 4.3 01/07/2016   CL 101 01/07/2016   CO2 25 01/07/2016   Lab Results  Component Value Date   WBC 9.7 01/07/2016   HGB 11.8* 01/07/2016   HCT 38.5 01/07/2016   MCV 93.2 01/07/2016   PLT 665* 01/07/2016   Lab Results  Component Value Date   ALT 40 12/27/2015   AST 60* 12/27/2015   ALKPHOS 298* 12/27/2015   BILITOT 1.1 12/27/2015   Lab Results  Component Value Date   INR 1.32 12/27/2015    Current radiology studies Dg Chest 2 View  01/07/2016  CLINICAL DATA:  Followup pneumonia. EXAM: CHEST  2 VIEW COMPARISON:  01/04/2016. FINDINGS: Extensive patchy airspace opacity in the right middle lobe with mild improvement. Clear left lung. Mildly prominent interstitial markings. Enlarged cardiac silhouette. Mild scoliosis. IMPRESSION: 1. Mildly improved dense right middle lobe pneumonia. 2. Stable cardiomegaly and mild chronic interstitial lung disease. Electronically Signed   By: Claudie Revering M.D.   On: 01/07/2016 09:38    Disposition:  Home     Medication List    STOP taking these medications        guaiFENesin 600 MG 12 hr tablet  Commonly known as:  MUCINEX     TYLENOL COLD/FLU SEVERE NIGHT PO      TAKE these medications        acetaminophen 325 MG tablet  Commonly known as:  TYLENOL  Take 650 mg by mouth every 6 (six) hours as needed for moderate pain.     busPIRone 30 MG tablet  Commonly known as:  BUSPAR  Take 15 mg by mouth 2 (two) times daily.     EMERGEN-C IMMUNE PLUS Pack  Take 1 Package by mouth daily as needed (for immune).     ibuprofen 200 MG tablet  Commonly known as:  ADVIL,MOTRIN  Take 800 mg by mouth every 6 (six) hours as needed for moderate pain.     PARoxetine 40 MG tablet  Commonly known as:   PAXIL  Take 40 mg by mouth every morning.          Discharged Condition: good  Time spent on discharge greater than 40 minutes.  Vital signs at Discharge. Temp:  [98.1 F (36.7 C)-98.9 F (37.2 C)] 98.1 F (36.7 C) (03/19 0546) Pulse Rate:  [68-69] 68 (03/19 0546) Resp:  [16-19] 16 (03/19 0546) BP: (97-100)/(60-81) 100/81 mmHg (03/19 0546) SpO2:  [92 %-100 %] 92 % (03/19 0546) Office follow up Special Information or instructions. She is to follow up with her PCP in one week and Dr. Lamonte Sakai in 4 -6 weeks. Signed: Richardson Landry Minor ACNP [facility] P[facility] Pager [phone] till 3 pm If no answer page [phone] 01/07/2016, 5:54 PM     Baltazar Apo, MD, PhD 01/07/2016, 6:09 PM [facility] [phone] or if no answer [phone]

## 2016-02-22 ENCOUNTER — Encounter: Payer: Self-pay | Admitting: Emergency Medicine

## 2016-02-22 ENCOUNTER — Ambulatory Visit (INDEPENDENT_AMBULATORY_CARE_PROVIDER_SITE_OTHER): Payer: BLUE CROSS/BLUE SHIELD | Admitting: Emergency Medicine

## 2016-02-22 VITALS — BP 152/108 | HR 93 | Ht 69.0 in | Wt 224.0 lb

## 2016-02-22 DIAGNOSIS — J9811 Atelectasis: Secondary | ICD-10-CM

## 2016-02-22 NOTE — Patient Instructions (Signed)
We will repeat your CT scan of the chest in June to compare with your prior. Follow with Dr Lamonte Sakai in  June to review the scan results. Please try to bring the old scan from [place] with you.

## 2016-02-22 NOTE — Assessment & Plan Note (Signed)
Hospitalized with severe to be acquired pneumonia with respiratory failure and associated ARDS. She had repeat CT scan 4/5 [facility] that I have reviewed and which showed some residual right lower lobe resolving infiltrate and a more prominent rounded right middle lobe focus that there will may be rounded atelectasis. This is a significant improvement compared with original CT scan 12/28/15. I believe she needs a repeat CT scan of the chest without contrast June 2017 to compare with this study.

## 2016-02-22 NOTE — Progress Notes (Signed)
Subjective:    Patient ID: [name], female    DOB: [birth year], 60 y.o.   MRN: [id number]  HPI 60 year old former smoker with a history of depression, was admitted to the ICU in March 2017 after presenting with flulike symptoms, evidence for a community acquired pneumonia with associated ARDS and ventilator dependent respiratory failure. Course, treated by septic shock. She was able to be discharged from the hospital on 01/07/16.  She still smokes one cig daily w her coffee - wants to stop. She had a CXR at [facility] 4/3 that was concerning for a lung abscess so a CT chest was done that showed continued infiltrate so she was treated w another course of levaquin. May have helped her energy level some, but she was asymptomatic at the time. She is feeling much better has been able to be more active. She feels that she is back to her usual baseline.    Review of Systems As per HPI  Past Medical History  Diagnosis Date  . Anxiety   . CAP (community acquired pneumonia)   . Depression   . Tobacco use      Family History  Problem Relation Age of Onset  . Heart disease Mother   . Heart disease Father   . Cancer Sister     ovarian ca     Social History   Social History  . Marital Status: Married    Spouse Name: N/A  . Number of Children: N/A  . Years of Education: N/A   Occupational History  . Not on file.   Social History Main Topics  . Smoking status: Former Smoker -- 0.50 packs/day for 20 years  . Smokeless tobacco: Not on file  . Alcohol Use: No  . Drug Use: No  . Sexual Activity: Not on file   Other Topics Concern  . Not on file   Social History Narrative  she is retired, Network engineer  No Known Allergies   Outpatient Prescriptions Prior to Visit  Medication Sig Dispense Refill  . acetaminophen (TYLENOL) 325 MG tablet Take 650 mg by mouth every 6 (six) hours as needed for moderate pain.     . busPIRone (BUSPAR) 30 MG tablet Take 15 mg by mouth daily.     Marland Kitchen  ibuprofen (ADVIL,MOTRIN) 200 MG tablet Take 800 mg by mouth every 6 (six) hours as needed for moderate pain.     . Multiple Vitamins-Minerals (EMERGEN-C IMMUNE PLUS) PACK Take 1 Package by mouth daily as needed (for immune).     Marland Kitchen PARoxetine (PAXIL) 40 MG tablet Take 40 mg by mouth every morning.     No facility-administered medications prior to visit.         Objective:   Physical Exam Filed Vitals:   02/22/16 1115  BP: 152/108  Pulse: 93  Height: 5\' 9"  (1.753 m)  Weight: 224 lb (101.606 kg)  SpO2: 98%   Gen: Pleasant, overweight woman, in no distress,  normal affect  ENT: No lesions,  mouth clear,  oropharynx clear, no postnasal drip  Neck: No JVD, no TMG, no carotid bruits  Lungs: No use of accessory muscles, clear without rales or rhonchi  Cardiovascular: RRR, heart sounds normal, no murmur or gallops, no peripheral edema  Musculoskeletal: No deformities, no cyanosis or clubbing  Neuro: alert, non focal  Skin: Warm, no lesions or rashes      Assessment & Plan:  CAP (community acquired pneumonia) Hospitalized with severe to be acquired pneumonia with respiratory failure  and associated ARDS. She had repeat CT scan 4/5 [facility] that I have reviewed and which showed some residual right lower lobe resolving infiltrate and a more prominent rounded right middle lobe focus that there will may be rounded atelectasis. This is a significant improvement compared with original CT scan 12/28/15. I believe she needs a repeat CT scan of the chest without contrast June 2017 to compare with this study.

## 2016-04-12 ENCOUNTER — Ambulatory Visit (INDEPENDENT_AMBULATORY_CARE_PROVIDER_SITE_OTHER)
Admission: RE | Admit: 2016-04-12 | Discharge: 2016-04-12 | Disposition: A | Discharge status: Home / Self Care | Payer: BLUE CROSS/BLUE SHIELD | Source: Ambulatory Visit | Attending: Emergency Medicine | Admitting: Emergency Medicine

## 2016-04-12 DIAGNOSIS — J9811 Atelectasis: Secondary | ICD-10-CM | POA: Diagnosis not present

## 2016-04-18 ENCOUNTER — Ambulatory Visit (INDEPENDENT_AMBULATORY_CARE_PROVIDER_SITE_OTHER): Payer: BLUE CROSS/BLUE SHIELD | Admitting: Emergency Medicine

## 2016-04-18 ENCOUNTER — Encounter: Payer: Self-pay | Admitting: Emergency Medicine

## 2016-04-18 VITALS — BP 132/90 | HR 96 | Ht 69.0 in | Wt 227.0 lb

## 2016-04-18 DIAGNOSIS — J189 Pneumonia, unspecified organism: Secondary | ICD-10-CM | POA: Diagnosis not present

## 2016-04-18 DIAGNOSIS — Z23 Encounter for immunization: Secondary | ICD-10-CM

## 2016-04-18 DIAGNOSIS — Z720 Tobacco use: Secondary | ICD-10-CM

## 2016-04-18 NOTE — Progress Notes (Signed)
Subjective:    Patient ID: [name], female    DOB: Mar 09, 1956, 60 y.o.   MRN: [id number]  HPI 60 year old former smoker with a history of depression, was admitted to the ICU in March 2017 after presenting with flulike symptoms, evidence for a community acquired pneumonia with associated ARDS and ventilator dependent respiratory failure. Course, treated by septic shock. She was able to be discharged from the hospital on 01/07/16.  She still smokes one cig daily w her coffee - wants to stop. She had a CXR at [facility] 4/3 that was concerning for a lung abscess so a CT chest was done that showed continued infiltrate so she was treated w another course of levaquin. May have helped her energy level some, but she was asymptomatic at the time. She is feeling much better has been able to be more active. She feels that she is back to her usual baseline.   ROV 04/18/16 -- follow-up visit for patient with a history of tobacco, severe community acquired pneumonia with ARDS and ventilator dependence in March 2017 as detailed above. She had residual right lower lobe infiltrate and a prominent rounded right middle lobe focus that was most consistent with rounded atelectasis. We elected to repeat her CT scan which was done on 04/12/16 and have personally reviewed. Both right lower lobe and the right middle lobe areas of opacity have always completely resolved. She is feeling much better, able to be active. No cough.   Review of Systems As per HPI  Past Medical History  Diagnosis Date  . Anxiety   . CAP (community acquired pneumonia)   . Depression   . Tobacco use      Family History  Problem Relation Age of Onset  . Heart disease Mother   . Heart disease Father   . Cancer Sister     ovarian ca     Social History   Social History  . Marital Status: Married    Spouse Name: N/A  . Number of Children: N/A  . Years of Education: N/A   Occupational History  . Not on file.   Social History Main  Topics  . Smoking status: Former Smoker -- 0.50 packs/day for 20 years  . Smokeless tobacco: Not on file  . Alcohol Use: No  . Drug Use: No  . Sexual Activity: Not on file   Other Topics Concern  . Not on file   Social History Narrative  she is retired, Network engineer  No Known Allergies   Outpatient Prescriptions Prior to Visit  Medication Sig Dispense Refill  . acetaminophen (TYLENOL) 325 MG tablet Take 650 mg by mouth every 6 (six) hours as needed for moderate pain.     . busPIRone (BUSPAR) 30 MG tablet Take 15 mg by mouth daily.     Marland Kitchen ibuprofen (ADVIL,MOTRIN) 200 MG tablet Take 800 mg by mouth every 6 (six) hours as needed for moderate pain.     . Multiple Vitamins-Minerals (EMERGEN-C IMMUNE PLUS) PACK Take 1 Package by mouth daily as needed (for immune).     Marland Kitchen PARoxetine (PAXIL) 40 MG tablet Take 40 mg by mouth every morning.     No facility-administered medications prior to visit.         Objective:   Physical Exam Filed Vitals:   04/18/16 0939  BP: 132/90  Pulse: 96  Height: 5\' 9"  (1.753 m)  Weight: 227 lb (102.967 kg)  SpO2: 99%   Gen: Pleasant, overweight woman, in no distress,  normal affect  ENT: No lesions,  mouth clear,  oropharynx clear, no postnasal drip  Neck: No JVD, no TMG, no carotid bruits  Lungs: No use of accessory muscles, clear without rales or rhonchi  Cardiovascular: RRR, heart sounds normal, no murmur or gallops, no peripheral edema  Musculoskeletal: No deformities, no cyanosis or clubbing  Neuro: alert, non focal  Skin: Warm, no lesions or rashes      Assessment & Plan:  Tobacco abuse Discussed cessation in detail today. She is working on this. No planned follow-up at this time. But if she does experience dyspnea or any signs of COPD I like to see her in the office, do full pulmonary function testing and determine whether she needs bronchodilators.  CAP (community acquired pneumonia) Very reassuring chest CT, shows full resolution  of her right middle lobe and right lower lobe opacities. Should not need any further imaging. We will give Prevnar 13 today. That means she will need Pneumovax in June 2018.   Baltazar Apo, MD, PhD 04/18/2016, 10:08 AM [facility] [phone] or if no answer [phone]

## 2016-04-18 NOTE — Addendum Note (Signed)
Addended by: Desmond Dike C on: 04/18/2016 10:13 AM   Modules accepted: Orders

## 2016-04-18 NOTE — Assessment & Plan Note (Signed)
Very reassuring chest CT, shows full resolution of her right middle lobe and right lower lobe opacities. Should not need any further imaging. We will give Prevnar 13 today. That means she will need Pneumovax in June 2018.

## 2016-04-18 NOTE — Patient Instructions (Signed)
Continue to avoid sick contacts as you are able Work to stop cigarettes completely Your CT scan of the chest shows full resolution of the PNA and changes seen in March Please contact our office if you have any changes in breathing. We would probably do full breathing testing if that were the case.

## 2016-04-18 NOTE — Assessment & Plan Note (Addendum)
Discussed cessation in detail today. She is working on this. No planned follow-up at this time. But if she does experience dyspnea or any signs of COPD I like to see her in the office, do full pulmonary function testing and determine whether she needs bronchodilators.

## 2016-09-23 ENCOUNTER — Other Ambulatory Visit: Payer: Self-pay | Admitting: Family Medicine

## 2016-09-23 DIAGNOSIS — N644 Mastodynia: Secondary | ICD-10-CM

## 2016-09-25 ENCOUNTER — Other Ambulatory Visit: Payer: Self-pay | Admitting: Family Medicine

## 2016-09-25 DIAGNOSIS — N644 Mastodynia: Secondary | ICD-10-CM

## 2016-09-27 ENCOUNTER — Other Ambulatory Visit: Payer: Self-pay | Admitting: Family Medicine

## 2016-09-27 ENCOUNTER — Ambulatory Visit
Admission: RE | Admit: 2016-09-27 | Discharge: 2016-09-27 | Disposition: A | Discharge status: Home / Self Care | Payer: BLUE CROSS/BLUE SHIELD | Source: Ambulatory Visit | Attending: Family Medicine | Admitting: Family Medicine

## 2016-09-27 DIAGNOSIS — N6320 Unspecified lump in the left breast, unspecified quadrant: Secondary | ICD-10-CM

## 2016-09-27 DIAGNOSIS — N644 Mastodynia: Secondary | ICD-10-CM

## 2016-09-30 ENCOUNTER — Other Ambulatory Visit: Payer: Self-pay | Admitting: Family Medicine

## 2016-09-30 ENCOUNTER — Ambulatory Visit
Admission: RE | Admit: 2016-09-30 | Discharge: 2016-09-30 | Disposition: A | Discharge status: Home / Self Care | Payer: BLUE CROSS/BLUE SHIELD | Source: Ambulatory Visit | Attending: Family Medicine | Admitting: Family Medicine

## 2016-09-30 DIAGNOSIS — N6320 Unspecified lump in the left breast, unspecified quadrant: Secondary | ICD-10-CM

## 2016-10-01 ENCOUNTER — Encounter: Payer: Self-pay | Admitting: *Deleted

## 2016-10-01 ENCOUNTER — Telehealth: Payer: Self-pay | Admitting: *Deleted

## 2016-10-01 DIAGNOSIS — C50212 Malignant neoplasm of upper-inner quadrant of left female breast: Secondary | ICD-10-CM

## 2016-10-01 DIAGNOSIS — Z170 Estrogen receptor positive status [ER+]: Secondary | ICD-10-CM

## 2016-10-01 HISTORY — DX: Malignant neoplasm of upper-inner quadrant of left female breast: C50.212

## 2016-10-01 NOTE — Telephone Encounter (Signed)
Confirmed [facility] for 10/09/16 at 0815 .  Instructions and contact information given.

## 2016-10-09 ENCOUNTER — Encounter: Payer: Self-pay | Admitting: Physical Therapy

## 2016-10-09 ENCOUNTER — Ambulatory Visit: Payer: BLUE CROSS/BLUE SHIELD | Attending: General Surgery | Admitting: Physical Therapy

## 2016-10-09 ENCOUNTER — Ambulatory Visit
Admission: RE | Admit: 2016-10-09 | Discharge: 2016-10-09 | Disposition: A | Discharge status: Home / Self Care | Payer: BLUE CROSS/BLUE SHIELD | Source: Ambulatory Visit | Attending: Radiation Oncology | Admitting: Radiation Oncology

## 2016-10-09 ENCOUNTER — Other Ambulatory Visit (HOSPITAL_BASED_OUTPATIENT_CLINIC_OR_DEPARTMENT_OTHER): Payer: BLUE CROSS/BLUE SHIELD

## 2016-10-09 ENCOUNTER — Ambulatory Visit (HOSPITAL_BASED_OUTPATIENT_CLINIC_OR_DEPARTMENT_OTHER): Payer: BLUE CROSS/BLUE SHIELD | Admitting: Oncology

## 2016-10-09 ENCOUNTER — Encounter: Payer: Self-pay | Admitting: Genetic Counselor

## 2016-10-09 ENCOUNTER — Encounter: Payer: Self-pay | Admitting: *Deleted

## 2016-10-09 ENCOUNTER — Ambulatory Visit: Payer: Self-pay | Admitting: General Surgery

## 2016-10-09 VITALS — BP 163/82 | HR 92 | Temp 98.1°F | Resp 18 | Ht 69.0 in | Wt 232.7 lb

## 2016-10-09 DIAGNOSIS — C50212 Malignant neoplasm of upper-inner quadrant of left female breast: Secondary | ICD-10-CM | POA: Diagnosis present

## 2016-10-09 DIAGNOSIS — R293 Abnormal posture: Secondary | ICD-10-CM | POA: Insufficient documentation

## 2016-10-09 DIAGNOSIS — Z170 Estrogen receptor positive status [ER+]: Secondary | ICD-10-CM

## 2016-10-09 LAB — COMPREHENSIVE METABOLIC PANEL
ALT: 26 U/L (ref 0–55)
AST: 23 U/L (ref 5–34)
Albumin: 3.8 g/dL (ref 3.5–5.0)
Alkaline Phosphatase: 148 U/L (ref 40–150)
Anion Gap: 9 mEq/L (ref 3–11)
BUN: 13.2 mg/dL (ref 7.0–26.0)
CHLORIDE: 105 meq/L (ref 98–109)
CO2: 26 meq/L (ref 22–29)
CREATININE: 0.8 mg/dL (ref 0.6–1.1)
Calcium: 9.5 mg/dL (ref 8.4–10.4)
EGFR: 79 mL/min/{1.73_m2} — ABNORMAL LOW (ref >90)
GLUCOSE: 132 mg/dL (ref 70–140)
POTASSIUM: 4.3 meq/L (ref 3.5–5.1)
SODIUM: 139 meq/L (ref 136–145)
Total Bilirubin: 0.54 mg/dL (ref 0.20–1.20)
Total Protein: 7.4 g/dL (ref 6.4–8.3)

## 2016-10-09 LAB — CBC WITH DIFFERENTIAL/PLATELET
BASO%: 0.6 % (ref 0.0–2.0)
BASOS ABS: 0.1 10*3/uL (ref 0.0–0.1)
EOS%: 3.2 % (ref 0.0–7.0)
Eosinophils Absolute: 0.3 10*3/uL (ref 0.0–0.5)
HCT: 41.7 % (ref 34.8–46.6)
HEMOGLOBIN: 13.6 g/dL (ref 11.6–15.9)
LYMPH#: 1.6 10*3/uL (ref 0.9–3.3)
LYMPH%: 17.1 % (ref 14.0–49.7)
MCH: 28.6 pg (ref 25.1–34.0)
MCHC: 32.5 g/dL (ref 31.5–36.0)
MCV: 88 fL (ref 79.5–101.0)
MONO#: 0.5 10*3/uL (ref 0.1–0.9)
MONO%: 5 % (ref 0.0–14.0)
NEUT#: 7.1 10*3/uL — ABNORMAL HIGH (ref 1.5–6.5)
NEUT%: 74.1 % (ref 38.4–76.8)
Platelets: 286 10*3/uL (ref 145–400)
RBC: 4.74 10*6/uL (ref 3.70–5.45)
RDW: 14.2 % (ref 11.2–14.5)
WBC: 9.6 10*3/uL (ref 3.9–10.3)

## 2016-10-09 NOTE — Progress Notes (Signed)
[place]  [phone]) [phone] [phone]) [phone]     ID: [name] DOB: Sep 23, 1956  MR#: [id number]  IRS#:[id number]  Patient Care Team: Chesley Noon, MD as PCP - General (Family Medicine) Excell Seltzer, MD as Consulting Physician (General Surgery) Chauncey Cruel, MD as Consulting Physician (Oncology) Gery Pray, MD as Consulting Physician (Radiation Oncology) Chauncey Cruel, MD OTHER MD:  CHIEF COMPLAINT: estrogen receptor positive breast cancer  CURRENT TREATMENT: neoadjuvant chemotherapy   BREAST CANCER HISTORY: "[name]" had a fall while traveling living to [place] in their RV and injure her left upper arm and  her left breast . Approximately 3 weeks later she noted a mass in the left breast upper inner quadrant. It was somewhat tender. She brought this to medical attention and on 09/27/2016 underwent bilateral diagnostic mammography with tomography [facility] The breast density was category B. The right breast was benign. In the left breast upper inner quadrant there was a large lobulated hyperdense mass which by exam was firm and nonmobile. By ultrasonography in the 11:30 o'clock radius 4 cm from the nipple there was a mass extending to the subcutaneous tissues and measuring 4.9 cm. There was a small superficial fluid collection overlying this consistent with a hematoma. The left axilla was benign.  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  The patient's subsequent history is as detailed below  INTERVAL HISTORY: [name] was evaluated in the multidisciplinary breast cancer clinic 10/09/2017 accompanied by her husband [name]. Her case  was also presented in the multidisciplinary breast cancer conference that same morning. At that time a preliminary plan was proposed: Neoadjuvant chemotherapy, genetics testing, followed by either a very large lumpectomy or mastectomy, in either case with sentinel lymph node sampling, with consideration of radiation, and anti-estrogens at the completion of local treatment  REVIEW OF SYSTEMS: Aside from the mass itself, there were no specific symptoms leading to the original mammogram, which was routinely scheduled. The patient denies unusual headaches, visual changes, nausea, vomiting, stiff neck, dizziness, or gait imbalance. There has been no cough, phlegm production, or pleurisy, no chest pain or pressure, and no change in bowel or bladder habits. The patient denies fever, rash, bleeding, unexplained fatigue or unexplained weight loss. She admits to some anxiety and depression and a phobia of doctors. A detailed review of systems was otherwise entirely negative.  PAST MEDICAL HISTORY: Past Medical History:  Diagnosis Date  . Anxiety   . CAP (community acquired pneumonia)   . Depression   . Malignant neoplasm of upper-inner quadrant of left female breast ([place] [facility]) 10/01/2016  . Tobacco use     PAST SURGICAL HISTORY: Past Surgical History:  Procedure Laterality Date  . ABDOMINAL HYSTERECTOMY  2011   Laparoscopic  . CHOLECYSTECTOMY    . open reduction left ankle      FAMILY HISTORY Family History  Problem Relation Age of Onset  . Heart disease Mother   . Heart disease Father   . Cancer Sister     ovarian ca  the patient's father died at the age of 63 from a myocardial infarction. The patient's mother died at the age of 73 with congestive heart failure. The patient had one brother, 2 sisters. One of her  sisters was diagnosed with ovarian cancer at age 69 and died at age 68 from that disease  GYNECOLOGIC HISTORY:  No LMP recorded. Patient has had a hysterectomy. Menarche age 31, first  live birth age 56. The patient is GX P1. She stopped having periods approxiately 2008. She did not take hormone replacement. She used oral contraceptives for approxi 30 years with no complications.  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 Her husband [name] is retired from [employer]. Their daughter [name] lives in [place] and is Mudlogger of the [employer] in [employer]. She is planning to get married 02/07/2017    ADVANCED DIRECTIVES: not in place   HEALTH MAINTENANCE: Social History  Substance Use Topics  . Smoking status: Current Every Day Smoker    Packs/day: 0.50    Years: 20.00  . Smokeless tobacco: Not on file  . Alcohol use No     Colonoscopy:  PAP:  Bone density:   No Known Allergies  Current Outpatient Prescriptions  Medication Sig Dispense Refill  . acetaminophen (TYLENOL) 325 MG tablet Take 650 mg by mouth every 6 (six) hours as needed for moderate pain.     . Biotin 1000 MCG tablet Take 2,000 mcg by mouth daily.    . busPIRone (BUSPAR) 30 MG tablet Take 15 mg by mouth daily.     Marland Kitchen ibuprofen (ADVIL,MOTRIN) 200 MG tablet Take 800 mg by mouth every 6 (six) hours as needed for moderate pain.     . Multiple Vitamins-Minerals (EMERGEN-C IMMUNE PLUS) PACK Take 1 Package by mouth daily as needed (for immune).     Marland Kitchen PARoxetine (PAXIL) 40 MG tablet Take 40 mg by mouth every morning.    . Red Yeast Rice Extract ([employer] RED YEAST RICE PO) Take 1 tablet by mouth daily.    Marland Kitchen Specialty Vitamins Products ([employer] HAIR/SKIN/NAILS) TABS Take 1 tablet by mouth daily.     No current facility-administered medications for this visit.     OBJECTIVE: middle-aged white woman in no acute distress Vitals:   10/09/16 0856  BP: (!) 163/82  Pulse: 92  Resp: 18  Temp: 98.1 F (36.7 C)     Body mass index is 34.36 kg/m.    ECOG FS:0 - Asymptomatic  Ocular: Sclerae unicteric, pupils equal, round and reactive to  light Ear-nose-throat: Oropharynx clear and moist Lymphatic: No cervical or supraclavicular adenopathy Lungs no rales or rhonchi, good excursion bilaterally Heart regular rate and rhythm, no murmur appreciated Abd soft, nontender, positive bowel sounds MSK no focal spinal tenderness, no joint edema Neuro: non-focal, well-oriented, appropriate affect Breasts: in the upper left breast there is a firm mass measuring between 3 and 4 cm, without associated erythema. I do not palpate any axillary adenopathy.   LAB RESULTS:  CMP     Component Value Date/Time   NA 139 10/09/2016 0838   K 4.3 10/09/2016 0838   CL 101 01/07/2016 0614   CO2 26 10/09/2016 0838   GLUCOSE 132 10/09/2016 0838   BUN 13.2 10/09/2016 0838   CREATININE 0.8 10/09/2016 0838   CALCIUM 9.5 10/09/2016 0838   PROT 7.4 10/09/2016 0838   ALBUMIN 3.8 10/09/2016 0838   AST 23 10/09/2016 0838   ALT 26 10/09/2016 0838   ALKPHOS 148 10/09/2016 0838   BILITOT 0.54 10/09/2016 0838   GFRNONAA >60 01/07/2016 0614   GFRAA >60 01/07/2016 0614    INo results found for: SPEP,  UPEP  Lab Results  Component Value Date   WBC 9.6 10/09/2016   NEUTROABS 7.1 (H) 10/09/2016   HGB 13.6 10/09/2016   HCT 41.7 10/09/2016   MCV 88.0 10/09/2016   PLT 286 10/09/2016      Chemistry      Component Value Date/Time   NA 139 10/09/2016 0838   K 4.3 10/09/2016 0838   CL 101 01/07/2016 0614   CO2 26 10/09/2016 0838   BUN 13.2 10/09/2016 0838   CREATININE 0.8 10/09/2016 0838      Component Value Date/Time   CALCIUM 9.5 10/09/2016 0838   ALKPHOS 148 10/09/2016 0838   AST 23 10/09/2016 0838   ALT 26 10/09/2016 0838   BILITOT 0.54 10/09/2016 0838       No results found for: LABCA2  No components found for: LABCA125  No results for input(s): INR in the last 168 hours.  Urinalysis    Component Value Date/Time   COLORURINE ORANGE (A) 12/27/2015 1339   APPEARANCEUR CLOUDY (A) 12/27/2015 1339   LABSPEC 1.030 12/27/2015 1339    PHURINE 5.5 12/27/2015 1339   GLUCOSEU NEGATIVE 12/27/2015 1339   HGBUR NEGATIVE 12/27/2015 1339   BILIRUBINUR SMALL (A) 12/27/2015 1339   KETONESUR 15 (A) 12/27/2015 1339   PROTEINUR 100 (A) 12/27/2015 1339   NITRITE NEGATIVE 12/27/2015 1339   LEUKOCYTESUR TRACE (A) 12/27/2015 1339     STUDIES: US Breast Ltd Uni Left Inc Axilla  Result Date: 09/27/2016 CLINICAL DATA:  Left upper slightly inner breast area of palpable concern, which the patient felt after fall with trauma to the same part of the breast 3 weeks ago. EXAM: 2D DIGITAL DIAGNOSTIC BILATERAL MAMMOGRAM WITH CAD AND ADJUNCT TOMO ULTRASOUND LEFT BREAST COMPARISON:  Previous exam(s). ACR Breast Density Category b: There are scattered areas of fibroglandular density. FINDINGS: There are no suspicious masses, areas of architectural distortion or microcalcifications in the right breast. There is a large lobulated hyperdense mass in the left breast upper slightly inner quadrant, middle depth, at the site of palpable concern. Mammographic images were processed with CAD. On physical exam, there is a large firm non mobile mass in the upper left breast. Targeted ultrasound is performed, showing left breast 11:30 o'clock 4 cm from the nipple irregular mixed echogenicity mass which is taller than wide and extends to the subcutaneous tissues. The mass measures 4.9 by 4.3 by 3.9 cm. A small superficial fluid collection is seen overlying this mass, which may represent overlying hematoma. There is no sonographic evidence of left axillary lymphadenopathy. IMPRESSION: Suspicious left breast 11:30 o'clock mass measuring 4.9 cm in greatest dimension, for which ultrasound-guided core needle biopsy is recommended. No evidence of left axillary lymphadenopathy. No mammographic evidence of malignancy in the right breast. RECOMMENDATION: Ultrasound-guided core needle biopsy of the left breast. I have discussed the findings and recommendations with the patient. Results  were also provided in writing at the conclusion of the visit. If applicable, a reminder letter will be sent to the patient regarding the next appointment. BI-RADS CATEGORY  4: Suspicious. Electronically Signed   By: Fidela Salisbury M.D.   On: 09/27/2016 09:43   Mm Diag Breast Tomo Bilateral  Result Date: 09/27/2016 CLINICAL DATA:  Left upper slightly inner breast area of palpable concern, which the patient felt after fall with trauma to the same part of the breast 3 weeks ago. EXAM: 2D DIGITAL DIAGNOSTIC BILATERAL MAMMOGRAM WITH CAD AND ADJUNCT TOMO ULTRASOUND LEFT BREAST COMPARISON:  Previous exam(s). ACR Breast Density Category  b: There are scattered areas of fibroglandular density. FINDINGS: There are no suspicious masses, areas of architectural distortion or microcalcifications in the right breast. There is a large lobulated hyperdense mass in the left breast upper slightly inner quadrant, middle depth, at the site of palpable concern. Mammographic images were processed with CAD. On physical exam, there is a large firm non mobile mass in the upper left breast. Targeted ultrasound is performed, showing left breast 11:30 o'clock 4 cm from the nipple irregular mixed echogenicity mass which is taller than wide and extends to the subcutaneous tissues. The mass measures 4.9 by 4.3 by 3.9 cm. A small superficial fluid collection is seen overlying this mass, which may represent overlying hematoma. There is no sonographic evidence of left axillary lymphadenopathy. IMPRESSION: Suspicious left breast 11:30 o'clock mass measuring 4.9 cm in greatest dimension, for which ultrasound-guided core needle biopsy is recommended. No evidence of left axillary lymphadenopathy. No mammographic evidence of malignancy in the right breast. RECOMMENDATION: Ultrasound-guided core needle biopsy of the left breast. I have discussed the findings and recommendations with the patient. Results were also provided in writing at the  conclusion of the visit. If applicable, a reminder letter will be sent to the patient regarding the next appointment. BI-RADS CATEGORY  4: Suspicious. Electronically Signed   By: Fidela Salisbury M.D.   On: 09/27/2016 09:43   Mm Clip Placement Left  Result Date: 09/30/2016 CLINICAL DATA:  Status post ultrasound-guided core needle biopsy of a 4.9 cm mass in the 11:30 o'clock position of the left breast. EXAM: DIAGNOSTIC LEFT MAMMOGRAM POST ULTRASOUND BIOPSY COMPARISON:  Previous exam(s). FINDINGS: Mammographic images were obtained following ultrasound guided biopsy of the recently demonstrated 4.9 cm mass in the 11:30 o'clock position of the left breast. These demonstrate a coil shaped biopsy marker clip within the mass. IMPRESSION: Appropriate clip deployment following left breast ultrasound-guided core needle biopsy. Final Assessment: Post Procedure Mammograms for Marker Placement Electronically Signed   By: Claudie Revering M.D.   On: 09/30/2016 09:39   [place] Lt Breast Bx W Loc Dev 1st Lesion Img Bx Spec US Guide  Addendum Date: 10/01/2016   ADDENDUM REPORT: 10/01/2016 15:03 ADDENDUM: Pathology revealed GRADE III INVASIVE DUCTAL CARCINOMA, HIGH GRADE DUCTAL CARCINOMA IN SITU of the Left breast at the 11:30 o'clock location. This was found to be concordant by Dr. Claudie Revering. Pathology results were discussed with the patient by telephone. The patient reported doing well after the biopsy with tenderness at the site. Post biopsy instructions and care were reviewed and questions were answered. The patient was encouraged to call [facility] for any additional concerns. The patient was referred to [facility] at [facility] on October 09, 2016. Pathology results reported by Terie Purser, RN on 10/01/2016. Electronically Signed   By: Claudie Revering M.D.   On: 10/01/2016 15:03   Result Date: 10/01/2016 CLINICAL DATA:  4.9 cm  mass in the 11:30 o'clock position of the left breast at recent mammography and ultrasound. Recent follow-up with marked bruising to that portion of the breast. The bruising has cleared. EXAM: ULTRASOUND GUIDED LEFT BREAST CORE NEEDLE BIOPSY COMPARISON:  Previous exam(s). FINDINGS: I met with the patient and we discussed the procedure of ultrasound-guided biopsy, including benefits and alternatives. We discussed the high likelihood of a successful procedure. We discussed the risks of the procedure, including infection, bleeding, tissue injury, clip migration, and inadequate sampling. Informed written consent was given.  The usual time-out protocol was performed immediately prior to the procedure. Using sterile technique and 1% Lidocaine as local anesthetic, under direct ultrasound visualization, a 12 gauge spring-loaded device was used to perform biopsy of the recently demonstrated 4.9 cm mass in the 11:30 o'clock position of the left breast using a inferomedial approach. At the conclusion of the procedure a coil shaped tissue marker clip was deployed into the biopsy cavity. Follow up 2 view mammogram was performed and dictated separately. IMPRESSION: Ultrasound guided biopsy of the 4.9 cm mass in the 11:30 o'clock position of the left breast. No apparent complications. Electronically Signed: By: Claudie Revering M.D. On: 09/30/2016 09:25    ELIGIBLE FOR AVAILABLE RESEARCH PROTOCOL: no  ASSESSMENT: 60 y.o. [employer], [place] woman status post left breast upper inner quadrant biopsy 09/30/2016 for a clinical T2 N0, stage II a invasive ductal carcinoma, grade 3, estrogen receptor positive, progesterone receptor negative, HER-2 not amplified, with an MIB-1 of 80%.  (1) genetics testing pending  (2) neoadjuvant chemotherapy to consist of cyclophosphamide and doxorubicin in dose dense fashion 4 followed by weekly paclitaxel 12   (3) definitive surgery to follow, with reconstruction as appropriate  (4) adjuvant  radiation as appropriate  (5) anti-estrogens to follow at the completion of local treatment  PLAN: We spent the better part of today's hour-long appointment discussing the biology of breast cancer in general, and the specifics of the patient's tumor in particular. We first reviewed the fact that cancer is not one disease but more than 100 different diseases and that it is important to keep them separate-- otherwise when friends and relatives discuss their own cancer experiences with Shashana confusion can result. Similarly we explained that if breast cancer spreads to the bone or liver, the patient would not have bone cancer or liver cancer, but breast cancer in the bone and breast cancer in the liver: one cancer in three places-- not 3 different cancers which otherwise would have to be treated in 3 different ways.  We discussed the difference between local and systemic therapy. In terms of loco-regional treatment, lumpectomy plus radiation is equivalent to mastectomy as far as survival is concerned. In 2 recess case, because the tumor is so large, right now mastectomy might be necessary. Whenever if we can shrink the tumor preop then perhaps a lumpectomy could be performed with good cosmetic results. We noted that in terms of sequencing of treatments, whether systemic therapy or surgery is done first does not affect the ultimate outcome, and in her case because of the surgical concerns just discussed and because she will need time to get genetics testing results before she makes a definitive surgical decision, we recommend neoadjuvant systemic therapy.  We then discussed the rationale for systemic therapy. There is some risk that this cancer may have already spread to other parts of her body. Patients frequently ask at this point about bone scans, CAT scans and PET scans to find out if they have occult breast cancer somewhere else. The problem is that in early stage disease we are much more likely to find  false positives then true cancers and this would expose the patient to unnecessary procedures as well as unnecessary radiation. Scans cannot answer the question the patient really would like to know, which is whether she has microscopic disease elsewhere in her body. For those reasons we do not recommend them.  Of course we would proceed to aggressive evaluation of any symptoms that might suggest metastatic disease, but that  is not the case here.  Next we went over the options for systemic therapy which are anti-estrogens, anti-HER-2 immunotherapy, and chemotherapy. Jorja does not meet criteria for anti-HER-2 immunotherapy. She is a fair candidate for anti-estrogens. However I am concerned that her tumor was progesterone receptor negative and only moderately estrogen receptor positive. We could send an Oncotype or Mammaprint as suggested by [facility] and we did discuss this both in conference and with the patient. However I would be uncomfortable if in this patient's the only systemic treatment was anti-estrogens given the fact that we know patients with progesterone receptor negative and not strongly estrogen receptor positive results do not get the same risk reduction as patient's or both estrogen and progesterone receptor strongly positive. My recommendation is that we proceed to chemotherapy and the patient is very much in agreement with this plan  The plan then will be to start chemotherapy, most likely 10/29/2015. She will see me a few days before that to discuss how to take her supportive treatment. Also she will have a breast MRI, and meet with our chemotherapy instruction nurse up prior to that visit. She had an echocardiogram 03/10/2017with an ejection fraction in the 55-60% and that will not need to be repeated.  The patient's [place] 02[place]07[place]. We will make sure that [name] is able to not only attend but fully participate in that ceremony.  she has a  good understanding of the overall plan. She agrees with it. She knows the goal of treatment in her case is cure. She will call with any problems that may develop before her next visit here.  Chauncey Cruel, MD   10/09/2016 3:45 PM Medical Oncology and Hematology [facility] [address] [place], [place] [address] Tel. [phone]    Fax. [phone]

## 2016-10-09 NOTE — Progress Notes (Signed)
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 female who had a fall with trauma several weeks ago. After the fall, she was able to palpate an area of concern in the left upper inner breast. Diagnostic mammogram on 09/27/16 revealed suspicious left breast mass at the 11:30 position measuring 4.9 cm in greatest dimension.  Biopsy on 09/30/16 showed invasive ductal carcinoma with characteristics as described above in the diagnosis. Receptor status was ER 80%, PR 0%, Her2 -, and Ki67 80%.  Patient is positive for breast lump and pain, skin cancer, anxiety, depression, and phobia of doctors.  Patient has a sister with ovarian cancer and she qualifies for genetics.  PREVIOUS RADIATION THERAPY: No  PAST MEDICAL HISTORY:  has a past medical history of Anxiety; CAP (community acquired pneumonia); Depression; Malignant neoplasm of upper-inner quadrant of left female breast ([place]) (10/01/2016); and Tobacco use.    PAST SURGICAL HISTORY: Past Surgical History:  Procedure Laterality Date  . ABDOMINAL HYSTERECTOMY  2011   Laparoscopic  . CHOLECYSTECTOMY    . open reduction left ankle      FAMILY HISTORY: family history includes Cancer in her sister; Heart disease in her father and mother.  SOCIAL HISTORY:  reports that she has quit smoking. She has a 10.00 pack-year smoking history. She does not have any smokeless tobacco history on file. She reports that she does not drink alcohol or use drugs.  ALLERGIES: Patient  has no known allergies.  MEDICATIONS:  Current Outpatient Prescriptions  Medication Sig Dispense Refill  . acetaminophen (TYLENOL) 325 MG tablet Take 650 mg by mouth every 6 (six) hours as needed for moderate pain.     . Biotin 1000 MCG tablet Take 2,000 mcg by mouth daily.    . busPIRone (BUSPAR) 30 MG tablet Take 15 mg by mouth daily.     Marland Kitchen ibuprofen (ADVIL,MOTRIN) 200 MG tablet Take 800 mg by mouth every 6 (six) hours as needed for moderate pain.     . Multiple Vitamins-Minerals (EMERGEN-C IMMUNE PLUS) PACK Take 1 Package by mouth daily as needed (for immune).     Marland Kitchen PARoxetine (PAXIL) 40 MG tablet Take 40 mg by mouth every morning.    . Red Yeast Rice Extract ([employer] RED YEAST RICE PO) Take 1 tablet by mouth daily.    Marland Kitchen Specialty Vitamins Products ([employer] HAIR/SKIN/NAILS) TABS Take 1 tablet by mouth daily.     No current facility-administered medications for this encounter.     REVIEW OF SYSTEMS: A 10+ POINT REVIEW OF SYSTEMS WAS OBTAINED including neurology, dermatology, psychiatry, cardiac, respiratory, lymph, extremities, GI, GU, Musculoskeletal, constitutional, breasts, reproductive, HEENT.  All pertinent positives are noted in the HPI.  All others are negative.   PHYSICAL EXAM:  Vitals with BMI 10/09/2016  Height 5' 9"  Weight 232 lbs 11 oz  BMI 49.8  Systolic 264  Diastolic 82  Pulse 92  Respirations 18   General: Alert and oriented, in no acute distress, Accompanied by husband on evaluation  Neck: Neck is supple, no palpable  cervical or supraclavicular lymphadenopathy. Heart: Regular in rate and rhythm with no murmurs, rubs, or gallops. Chest: Clear to auscultation bilaterally, with no rhonchi, wheezes, or rales. Abdomen: Soft, nontender, nondistended, with no rigidity or guarding. Extremities: No cyanosis or edema. Musculoskeletal: symmetric strength and muscle tone throughout. Neurologic:  No obvious focalities. Speech is fluent. Coordination is intact. Psychiatric:  Judgment and insight are intact. Affect is appropriate. Breasts: Right breast no palpable mass or nipple discharge. Left breast there is a large palpable mass in the UIQ at the 11:30 position. The mass is approximately 5 x 6 cm in size. There is no skin or chest wall involvement, no nipple discharge or bleeding. No other palpable masses appreciated in the breasts or axillae.    ECOG = 1   LABORATORY DATA:  Lab Results  Component Value Date   WBC 9.7 01/07/2016   HGB 11.8 (L) 01/07/2016   HCT 38.5 01/07/2016   MCV 93.2 01/07/2016   PLT 665 (H) 01/07/2016   CMP     Component Value Date/Time   NA 138 01/07/2016 0614   K 4.3 01/07/2016 0614   CL 101 01/07/2016 0614   CO2 25 01/07/2016 0614   GLUCOSE 115 (H) 01/07/2016 0614   BUN 19 01/07/2016 0614   CREATININE 0.70 01/07/2016 0614   CALCIUM 9.3 01/07/2016 0614   PROT 7.2 12/27/2015 1140   ALBUMIN 2.4 (L) 12/27/2015 1140   AST 60 (H) 12/27/2015 1140   ALT 40 12/27/2015 1140   ALKPHOS 298 (H) 12/27/2015 1140   BILITOT 1.1 12/27/2015 1140   GFRNONAA >60 01/07/2016 0614   GFRAA >60 01/07/2016 1610         RADIOGRAPHY: US Breast Ltd Uni Left Inc Axilla  Result Date: 09/27/2016 CLINICAL DATA:  Left upper slightly inner breast area of palpable concern, which the patient felt after fall with trauma to the same part of the breast 3 weeks ago. EXAM: 2D DIGITAL DIAGNOSTIC BILATERAL MAMMOGRAM WITH CAD AND ADJUNCT TOMO ULTRASOUND LEFT BREAST COMPARISON:  Previous exam(s). ACR Breast Density Category b: There are scattered areas of fibroglandular density. FINDINGS: There are no suspicious masses, areas of architectural distortion or microcalcifications in the right breast. There is a large lobulated hyperdense mass in the left breast upper slightly inner quadrant, middle depth, at the site of palpable concern. Mammographic images were processed with CAD. On physical exam, there is a large firm non mobile mass in the upper left breast.  Targeted ultrasound is performed, showing left breast 11:30 o'clock 4 cm from the nipple irregular mixed echogenicity mass which is taller than wide and extends to the subcutaneous tissues. The mass measures 4.9 by 4.3 by 3.9 cm. A small superficial fluid collection is seen overlying this mass, which may represent overlying hematoma. There is no sonographic evidence of left axillary lymphadenopathy. IMPRESSION: Suspicious left breast 11:30 o'clock mass measuring 4.9 cm in greatest dimension, for which ultrasound-guided core needle biopsy is recommended. No evidence of left axillary lymphadenopathy. No mammographic evidence of malignancy in the right breast. RECOMMENDATION: Ultrasound-guided core needle biopsy of the left breast. I have discussed the findings and recommendations with the patient. Results were also provided in writing at the conclusion of the visit. If applicable, a reminder letter will be sent to the patient regarding the next appointment. BI-RADS CATEGORY  4: Suspicious. Electronically Signed   By: Fidela Salisbury M.D.   On: 09/27/2016 09:43   Mm Diag Breast Tomo Bilateral  Result Date: 09/27/2016 CLINICAL DATA:  Left  upper slightly inner breast area of palpable concern, which the patient felt after fall with trauma to the same part of the breast 3 weeks ago. EXAM: 2D DIGITAL DIAGNOSTIC BILATERAL MAMMOGRAM WITH CAD AND ADJUNCT TOMO ULTRASOUND LEFT BREAST COMPARISON:  Previous exam(s). ACR Breast Density Category b: There are scattered areas of fibroglandular density. FINDINGS: There are no suspicious masses, areas of architectural distortion or microcalcifications in the right breast. There is a large lobulated hyperdense mass in the left breast upper slightly inner quadrant, middle depth, at the site of palpable concern. Mammographic images were processed with CAD. On physical exam, there is a large firm non mobile mass in the upper left breast. Targeted ultrasound is performed, showing  left breast 11:30 o'clock 4 cm from the nipple irregular mixed echogenicity mass which is taller than wide and extends to the subcutaneous tissues. The mass measures 4.9 by 4.3 by 3.9 cm. A small superficial fluid collection is seen overlying this mass, which may represent overlying hematoma. There is no sonographic evidence of left axillary lymphadenopathy. IMPRESSION: Suspicious left breast 11:30 o'clock mass measuring 4.9 cm in greatest dimension, for which ultrasound-guided core needle biopsy is recommended. No evidence of left axillary lymphadenopathy. No mammographic evidence of malignancy in the right breast. RECOMMENDATION: Ultrasound-guided core needle biopsy of the left breast. I have discussed the findings and recommendations with the patient. Results were also provided in writing at the conclusion of the visit. If applicable, a reminder letter will be sent to the patient regarding the next appointment. BI-RADS CATEGORY  4: Suspicious. Electronically Signed   By: Fidela Salisbury M.D.   On: 09/27/2016 09:43   Mm Clip Placement Left  Result Date: 09/30/2016 CLINICAL DATA:  Status post ultrasound-guided core needle biopsy of a 4.9 cm mass in the 11:30 o'clock position of the left breast. EXAM: DIAGNOSTIC LEFT MAMMOGRAM POST ULTRASOUND BIOPSY COMPARISON:  Previous exam(s). FINDINGS: Mammographic images were obtained following ultrasound guided biopsy of the recently demonstrated 4.9 cm mass in the 11:30 o'clock position of the left breast. These demonstrate a coil shaped biopsy marker clip within the mass. IMPRESSION: Appropriate clip deployment following left breast ultrasound-guided core needle biopsy. Final Assessment: Post Procedure Mammograms for Marker Placement Electronically Signed   By: Claudie Revering M.D.   On: 09/30/2016 09:39   [place] Lt Breast Bx W Loc Dev 1st Lesion Img Bx Spec US Guide  Addendum Date: 10/01/2016   ADDENDUM REPORT: 10/01/2016 15:03 ADDENDUM: Pathology revealed GRADE III  INVASIVE DUCTAL CARCINOMA, HIGH GRADE DUCTAL CARCINOMA IN SITU of the Left breast at the 11:30 o'clock location. This was found to be concordant by Dr. Claudie Revering. Pathology results were discussed with the patient by telephone. The patient reported doing well after the biopsy with tenderness at the site. Post biopsy instructions and care were reviewed and questions were answered. The patient was encouraged to call [facility] for any additional concerns. The patient was referred to [facility] on October 09, 2016. Pathology results reported by Terie Purser, RN on 10/01/2016. Electronically Signed   By: Claudie Revering M.D.   On: 10/01/2016 15:03   Result Date: 10/01/2016 CLINICAL DATA:  4.9 cm mass in the 11:30 o'clock position of the left breast at recent mammography and ultrasound. Recent follow-up with marked bruising to that portion of the breast. The bruising has cleared. EXAM: ULTRASOUND GUIDED LEFT BREAST CORE NEEDLE BIOPSY COMPARISON:  Previous exam(s). FINDINGS:  I met with the patient and we discussed the procedure of ultrasound-guided biopsy, including benefits and alternatives. We discussed the high likelihood of a successful procedure. We discussed the risks of the procedure, including infection, bleeding, tissue injury, clip migration, and inadequate sampling. Informed written consent was given. The usual time-out protocol was performed immediately prior to the procedure. Using sterile technique and 1% Lidocaine as local anesthetic, under direct ultrasound visualization, a 12 gauge spring-loaded device was used to perform biopsy of the recently demonstrated 4.9 cm mass in the 11:30 o'clock position of the left breast using a inferomedial approach. At the conclusion of the procedure a coil shaped tissue marker clip was deployed into the biopsy cavity. Follow up 2 view mammogram was performed and  dictated separately. IMPRESSION: Ultrasound guided biopsy of the 4.9 cm mass in the 11:30 o'clock position of the left breast. No apparent complications. Electronically Signed: By: Claudie Revering M.D. 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 She will undergo neoadjuvant chemotherapy with port placement with anticipated start on 09/27/16. Followed by radiation therapy. She will then be placed on an aromatase inhibitor for 5 years.  It would be difficult to have a good cosmetic result if patient were to proceed with surgery at this time. With neoadjuvant treatment, patient would have a better chance for breast conserving surgery and a better cosmetic outcome.   It was a pleasure meeting the patient today. We discussed the risks, benefits, and side effects of radiotherapy. I recommend radiotherapy to the left breast to reduce her risk of locoregional recurrence by 2/3.  We discussed that radiation would take approximately 6 weeks to complete and that I would give the patient a few weeks to heal following surgery before starting treatment planning.  If chemotherapy were to be given, this would precede radiotherapy. We spoke about acute effects including skin irritation and fatigue as well as much less common late effects including internal organ injury or irritation. We spoke about the latest technology that is used to minimize the risk of late effects for patients undergoing radiotherapy to the breast or chest wall. No guarantees of treatment were given. The patient is enthusiastic about proceeding with treatment. I look forward to participating in the patient's care.  I will await her referral back to me for postoperative follow-up and eventual CT simulation/treatment planning.  [name] Promise,  MD __________________________________________   This document serves as a record of services personally performed by Gery Pray, MD. It was created on his behalf by Bethann Humble, a trained medical scribe. The creation of this record is based on the scribe's personal observations and the provider's statements to them. This document has been checked and approved by the attending provider.

## 2016-10-09 NOTE — Progress Notes (Signed)
Clinical Social Work [place] Psychosocial Distress Screening [place]  Patient completed distress screening protocol and scored an 8 on the Psychosocial Distress Thermometer which indicates moderate distress. Clinical Social Worker met with patient and patients husband in [facility] to assess for distress and other psychosocial needs. Patient stated she was feeling overwhelmed and was anxious about chemo being part of her treatment plan.  Patient stated it was helpful to get more information on her treatment plan, and she felt comfortable with the healthcare team. CSW and patient discussed common feeling and emotions when being diagnosed with cancer, and the importance of support during treatment. CSW informed patient of the support team and support services at [facility], and patient was agreeable to an [employer] referral. CSW provided contact information and encouraged patient to call with any questions or concerns.   ONCBCN DISTRESS SCREENING 10/09/2016  Screening Type Initial Screening  Distress experienced in past week (1-10) 8  Emotional problem type Nervousness/Anxiety;Adjusting to illness;Feeling hopeless  Physical Problem type Pain  Physician notified of physical symptoms Yes  Referral to clinical social work Yes  Referral to support programs Yes    Johnnye Lana, MSW, LCSW, OSW-C Clinical Social Worker [place] [phone]

## 2016-10-09 NOTE — Progress Notes (Signed)
START ON PATHWAY REGIMEN - Breast  [id number]: Dose-Dense AC-T (Paclitaxel Weekly) - [Doxorubicin + Cyclophosphamide q14 Days x 4 Cycles, Followed by Paclitaxel 80 mg/m2 Weekly x 12 Weeks]  Dose-Dense AC q14 days:   A cycle is every 14 days:     Doxorubicin (Adriamycin(R)) 60 mg/m2 IV push on day 1 only. Dose Mod: None     Cyclophosphamide (Cytoxan(R)) 600 mg/m2 in 250 mL NS IV over 30 minutes on day 1 only. Dose Mod: None     Pegfilgrastim (Neulasta(R)) 6 mg flat dose subcutaneously once on day 2 only.  G-CSF recommended due to data showing a >20% risk of febrile neutropenia. Dose Mod: None  **Always confirm dose/schedule in your pharmacy ordering system**    Paclitaxel 80 mg/m2 Weekly:   Administer weekly:     Paclitaxel (Taxol(R)) 80 mg/m2 in 250 mL NS IV over 1 hour Dose Mod: None  **Always confirm dose/schedule in your pharmacy ordering system**    Patient Characteristics: Neoadjuvant Chemotherapy, HER2/neu Negative/Unknown/Equivocal, ER Positive AJCC Stage Grouping: IIA Current Disease Status: No Distant Mets or Local Recurrence AJCC M Stage: 0 ER Status: Positive (+) AJCC N Stage: 0 AJCC T Stage: 2 HER2/neu: Negative (-) PR Status: Negative (-)  Intent of Therapy: Curative Intent, Discussed with Patient

## 2016-10-09 NOTE — Progress Notes (Signed)
Nutrition Assessment  Reason for Assessment:  Pt seen in Breast Clinic  ASSESSMENT:   60 year old female with new diagnosis of left breast cancer.  Past medical history reviewed  Medications:  reviewed  Labs: reviewed  Anthropometrics:   Height: 69 inches Weight: 232 lb BMI: 34   NUTRITION DIAGNOSIS: Food and nutrition related knowledge deficit related to new diagnosis of breast cancer as evidenced by no prior need for nutrition related information.  INTERVENTION:   Discussed and provided packet of information regarding nutritional tips for breast cancer patients.  Questions answered.  Teachback method used.     MONITORING, EVALUATION, and GOAL: Pt will consume a healthy plant based diet to maintain lean body mass throughout treatment.   [name], [place], [place] (pager)

## 2016-10-09 NOTE — Patient Instructions (Signed)

## 2016-10-09 NOTE — Therapy (Signed)
[place], [place], [address] Phone: [phone]   Fax:  [phone]  Physical Therapy Evaluation  Patient Details  Name: [name] MRN: [id number] Date of Birth: [birth year] Referring Provider: Dr. Excell Seltzer  Encounter Date: 10/09/2016      PT End of Session - 10/09/16 1217    Visit Number 1   Number of Visits 1   PT Start Time 5859   PT Stop Time 1131   PT Time Calculation (min) 26 min   Activity Tolerance Patient tolerated treatment well   Behavior During Therapy [facility] for tasks assessed/performed      Past Medical History:  Diagnosis Date  . Anxiety   . CAP (community acquired pneumonia)   . Depression   . Malignant neoplasm of upper-inner quadrant of left female breast ([place]) 10/01/2016  . Tobacco use     Past Surgical History:  Procedure Laterality Date  . ABDOMINAL HYSTERECTOMY  2011   Laparoscopic  . CHOLECYSTECTOMY    . open reduction left ankle      There were no vitals filed for this visit.       Subjective Assessment - 10/09/16 1218    Subjective Patient reports she is here today to be seen by her medical team for her newly diagnosed left breast cancer.   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  It measures 4.9 cm and is located in the upper inner quadrant.  It is ER positive, PR negative and HER2 negative with a Ki67 of 80%.    Patient Stated Goals Reduce lymphedema risk and learn post op shoulder ROM HEP            [facility] PT Assessment - 10/09/16 0001      Assessment   Medical Diagnosis Left breast cancer   Referring Provider Dr. Excell Seltzer   Onset Date/Surgical Date 09/27/16   Hand Dominance Right   Prior Therapy none     Precautions   Precautions Other (comment)   Precaution Comments active cancer     Restrictions    Weight Bearing Restrictions No     Balance Screen   Has the patient fallen in the past 6 months Yes   How many times? 1  Tripped on a step in the middle of the night   Has the patient had a decrease in activity level because of a fear of falling?  No   Is the patient reluctant to leave their home because of a fear of falling?  No     Home Ec[place] residence   Living Arrangements Spouse/significant other   Available Help at Discharge Family     Prior Function   Level of [place] Retired   Leisure She does not exercise     Cognition   Overall Cognitive Status Within Functional Limits for tasks assessed     Posture/Postural Control   Posture/Postural Control Postural limitations   Postural Limitations Rounded Shoulders;Forward head     ROM / Strength   AROM / PROM / Strength AROM;Strength     AROM   AROM Assessment Site Shoulder;Cervical   Right/Left Shoulder Right;Left   Right Shoulder Extension 49 Degrees   Right Shoulder Flexion 154 Degrees   Right Shoulder ABduction 158 Degrees   Right Shoulder Internal Rotation 70 Degrees  Right Shoulder External Rotation 83 Degrees   Left Shoulder Extension 45 Degrees   Left Shoulder Flexion 149 Degrees   Left Shoulder ABduction 155 Degrees   Left Shoulder Internal Rotation 72 Degrees   Left Shoulder External Rotation 77 Degrees   Cervical Flexion WNL   Cervical Extension WNL   Cervical - Right Side Bend WNL   Cervical - Left Side Bend WNL   Cervical - Right Rotation WNL   Cervical - Left Rotation WNL     Strength   Overall Strength Within functional limits for tasks performed      Patient was instructed today in a home exercise program today for post op shoulder range of motion. These included active assist shoulder flexion in sitting, scapular retraction, wall walking with shoulder abduction, and hands behind head external rotation.  She was encouraged to do these  twice a day, holding 3 seconds and repeating 5 times when permitted by her physician.         PT Education - 10/09/16 1213    Education provided Yes   Education Details Lymphedema risk reduction and post op shoulder ROM    Person(s) Educated Patient;Spouse   Methods Explanation;Demonstration;Handout   Comprehension Verbalized understanding;Returned demonstration              Breast Clinic Goals - 10/09/16 1226      Patient will be able to verbalize understanding of pertinent lymphedema risk reduction practices relevant to her diagnosis specifically related to skin care.   Time 1   Period Days   Status Achieved     Patient will be able to return demonstrate and/or verbalize understanding of the post-op home exercise program related to regaining shoulder range of motion.   Time 1   Period Days   Status Achieved     Patient will be able to verbalize understanding of the importance of attending the postoperative After Breast Cancer Class for further lymphedema risk reduction education and therapeutic exercise.   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  It measures 4.9 cm and is located in the upper inner quadrant.  It is ER positive, PR negative and HER2 negative with a Ki67 of 80%. Her multidisciplinary medical team met prior to her assessments to determine a recommended treatment plan. She is planning to have neoadjuvant chemotherapy followed by likely a left lumpectomy and sentinel node biopsy, radiation and anti-estrogen therapy.  She may benefit from post op PT to regain shoulder ROM and reduce lymphedema risk.  Due to her lack of comorbidities, her eval was of low complexity.   Rehab Potential Excellent   Clinical Impairments Affecting Rehab Potential none   PT Frequency One time visit   PT  Treatment/Interventions Patient/family education;Therapeutic exercise   PT Next Visit Plan Will f/u after surgery to determine PT needs   PT Home Exercise Plan Post op shoulder ROM HEP and lymphedema risk reduction   Consulted and Agree with Plan of Care Patient;Family member/caregiver   Family Member Consulted Husband      Patient will benefit from skilled therapeutic intervention in order to improve the following deficits and impairments:  Postural dysfunction, Decreased knowledge of precautions, Pain, Impaired UE functional use, Decreased range of motion  Visit Diagnosis: Abnormal posture -  Plan: PT plan of care cert/re-cert  Carcinoma of upper-inner quadrant of left breast in female, estrogen receptor positive ([place]) - Plan: PT plan of care cert/re-cert   Patient will follow up at outpatient cancer rehab if needed following surgery.  If the patient requires physical therapy at that time, a specific plan will be dictated and sent to the referring physician for approval. The patient was educated today on appropriate basic range of motion exercises to begin post operatively and the importance of attending the After Breast Cancer class following surgery.  Patient was educated today on lymphedema risk reduction practices as it pertains to recommendations that will benefit the patient immediately following surgery.  She verbalized good understanding.  No additional physical therapy is indicated at this time.      Problem List Patient Active Problem List   Diagnosis Date Noted  . Malignant neoplasm of upper-inner quadrant of left female breast ([place]) 10/01/2016  . CAP (community acquired pneumonia) 12/27/2015  . Elevated lactic acid level 12/27/2015  . Hypokalemia 12/27/2015  . Leukocytosis 12/27/2015  . Tobacco abuse 12/27/2015  . Sepsis ([place]) 12/27/2015    Annia Friendly, PT 10/09/16 12:29 PM  [place], [place], [address] Phone: [phone]   Fax:  [phone]  Name: [name] MRN: [id number] Date of Birth: 02-24-1956

## 2016-10-10 ENCOUNTER — Telehealth: Payer: Self-pay | Admitting: *Deleted

## 2016-10-10 NOTE — Telephone Encounter (Signed)
Received call from representative stating that MD [name] needs to sign off MRI orders. Message forwarded.

## 2016-10-11 ENCOUNTER — Other Ambulatory Visit: Payer: Self-pay | Admitting: Oncology

## 2016-10-11 ENCOUNTER — Telehealth: Payer: Self-pay | Admitting: *Deleted

## 2016-10-11 DIAGNOSIS — Z170 Estrogen receptor positive status [ER+]: Principal | ICD-10-CM

## 2016-10-11 DIAGNOSIS — C50212 Malignant neoplasm of upper-inner quadrant of left female breast: Secondary | ICD-10-CM

## 2016-10-11 NOTE — Telephone Encounter (Signed)
Received call from patient stating she went for a 2nd opinion and will be receiving her care and treatment elsewhere.  I have cancelled all of her appointments.  Encouraged her to call with any needs or concerns.

## 2016-10-15 ENCOUNTER — Ambulatory Visit (HOSPITAL_COMMUNITY): Payer: BLUE CROSS/BLUE SHIELD

## 2016-10-20 ENCOUNTER — Other Ambulatory Visit: Payer: Self-pay | Admitting: Oncology

## 2016-10-20 NOTE — Progress Notes (Unsigned)
[name]  [phone] [phone]     ID: [name] DOB: 05-Sep-1956  MR#: [id number]  JYN#:[id number]  Patient Care Team: Chesley Noon, MD as PCP - General (Family Medicine) Excell Seltzer, MD as Consulting Physician (General Surgery) Chauncey Cruel, MD as Consulting Physician (Oncology) Gery Pray, MD as Consulting Physician (Radiation Oncology) Collene Gobble, MD as Consulting Physician (Pulmonary Disease) Chauncey Cruel, MD OTHER MD:  CHIEF COMPLAINT: estrogen receptor positive breast cancer  CURRENT TREATMENT: neoadjuvant chemotherapy   BREAST CANCER HISTORY: "[name]" had a fall while traveling living to [place] in their RV and injure her left upper arm and  her left breast . Approximately 3 weeks later she noted a mass in the left breast upper inner quadrant. It was somewhat tender. She brought this to medical attention and on 09/27/2016 underwent bilateral diagnostic mammography with tomography [facility] The breast density was category B. The right breast was benign. In the left breast upper inner quadrant there was a large lobulated hyperdense mass which by exam was firm and nonmobile. By ultrasonography in the 11:30 o'clock radius 4 cm from the nipple there was a mass extending to the subcutaneous tissues and measuring 4.9 cm. There was a small superficial fluid collection overlying this consistent with a hematoma. The left axilla was benign.  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  The patient's subsequent history is as detailed below  INTERVAL HISTORY: [name] was evaluated in the multidisciplinary breast  cancer clinic 10/09/2017 accompanied by her husband [name]. Her case was also presented in the multidisciplinary breast cancer conference that same morning. At that time a preliminary plan was proposed: Neoadjuvant chemotherapy, genetics testing, followed by either a very large lumpectomy or mastectomy, in either case with sentinel lymph node sampling, with consideration of radiation, and anti-estrogens at the completion of local treatment  REVIEW OF SYSTEMS: Aside from the mass itself, there were no specific symptoms leading to the original mammogram, which was routinely scheduled. The patient denies unusual headaches, visual changes, nausea, vomiting, stiff neck, dizziness, or gait imbalance. There has been no cough, phlegm production, or pleurisy, no chest pain or pressure, and no change in bowel or bladder habits. The patient denies fever, rash, bleeding, unexplained fatigue or unexplained weight loss. She admits to some anxiety and depression and a phobia of doctors. A detailed review of systems was otherwise entirely negative.  PAST MEDICAL HISTORY: Past Medical History:  Diagnosis Date  . Anxiety   . CAP (community acquired pneumonia)   . Depression   . Malignant neoplasm of upper-inner quadrant of left female breast ([facility]) 10/01/2016  . Tobacco use     PAST SURGICAL HISTORY: Past Surgical History:  Procedure Laterality Date  . ABDOMINAL HYSTERECTOMY  2011   Laparoscopic  . CHOLECYSTECTOMY    . open reduction left ankle      FAMILY HISTORY Family History  Problem Relation Age of Onset  . Heart disease Mother   . Heart disease Father   . Cancer Sister     ovarian ca  the patient's father died at the age of 54 from a myocardial infarction. The patient's mother died at the age of 64 with congestive heart failure. The  patient had one brother, 2 sisters. One of her sisters was diagnosed with ovarian cancer at age 65 and died at age 26 from that disease  GYNECOLOGIC HISTORY:  No LMP  recorded. Patient has had a hysterectomy. Menarche age 57, first live birth age 5. The patient is GX P1. She stopped having periods approxiately 2008. She did not take hormone replacement. She used oral contraceptives for approxi 30 years with no complications.  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 Her husband [name] is retired from [employer]. Their daughter [name] lives in [place] and is Mudlogger of the [employer] in [employer]. She is planning to get married 02/07/2017    ADVANCED DIRECTIVES: not in place   HEALTH MAINTENANCE: Social History  Substance Use Topics  . Smoking status: Current Every Day Smoker    Packs/day: 0.50    Years: 20.00  . Smokeless tobacco: Not on file  . Alcohol use No     Colonoscopy:  PAP:  Bone density:   No Known Allergies  Current Outpatient Prescriptions  Medication Sig Dispense Refill  . acetaminophen (TYLENOL) 325 MG tablet Take 650 mg by mouth every 6 (six) hours as needed for moderate pain.     . Biotin 1000 MCG tablet Take 2,000 mcg by mouth daily.    . busPIRone (BUSPAR) 30 MG tablet Take 15 mg by mouth daily.     Marland Kitchen ibuprofen (ADVIL,MOTRIN) 200 MG tablet Take 800 mg by mouth every 6 (six) hours as needed for moderate pain.     . Multiple Vitamins-Minerals (EMERGEN-C IMMUNE PLUS) PACK Take 1 Package by mouth daily as needed (for immune).     Marland Kitchen PARoxetine (PAXIL) 40 MG tablet Take 40 mg by mouth every morning.    . Red Yeast Rice Extract ([employer]S RED YEAST RICE PO) Take 1 tablet by mouth daily.    Marland Kitchen Specialty Vitamins Products ([employer] HAIR/SKIN/NAILS) TABS Take 1 tablet by mouth daily.     No current facility-administered medications for this visit.     OBJECTIVE: middle-aged white woman in no acute distress There were no vitals filed for this visit.   There is no height or weight on file to calculate BMI.    ECOG FS:0 - Asymptomatic  Ocular: Sclerae unicteric, pupils equal,  round and reactive to light Ear-nose-throat: Oropharynx clear and moist Lymphatic: No cervical or supraclavicular adenopathy Lungs no rales or rhonchi, good excursion bilaterally Heart regular rate and rhythm, no murmur appreciated Abd soft, nontender, positive bowel sounds MSK no focal spinal tenderness, no joint edema Neuro: non-focal, well-oriented, appropriate affect Breasts: in the upper left breast there is a firm mass measuring between 3 and 4 cm, without associated erythema. I do not palpate any axillary adenopathy.   LAB RESULTS:  CMP     Component Value Date/Time   NA 139 10/09/2016 0838   K 4.3 10/09/2016 0838   CL 101 01/07/2016 0614   CO2 26 10/09/2016 0838   GLUCOSE 132 10/09/2016 0838   BUN 13.2 10/09/2016 0838   CREATININE 0.8 10/09/2016 0838   CALCIUM 9.5 10/09/2016 0838   PROT 7.4 10/09/2016 0838   ALBUMIN 3.8 10/09/2016 0838   AST 23 10/09/2016 0838   ALT 26 10/09/2016 0838   ALKPHOS 148 10/09/2016 0838   BILITOT 0.54 10/09/2016 0838   GFRNONAA >60 01/07/2016 0614   GFRAA >60 01/07/2016 0614    INo results found for: SPEP, UPEP  Lab Results  Component Value Date   WBC 9.6 10/09/2016   NEUTROABS 7.1 (H) 10/09/2016   HGB 13.6 10/09/2016   HCT 41.7 10/09/2016   MCV 88.0 10/09/2016   PLT 286 10/09/2016      Chemistry      Component Value Date/Time   NA 139 10/09/2016 0838   K 4.3 10/09/2016 0838   CL 101 01/07/2016 0614   CO2 26 10/09/2016 0838   BUN 13.2 10/09/2016 0838   CREATININE 0.8 10/09/2016 0838      Component Value Date/Time   CALCIUM 9.5 10/09/2016 0838   ALKPHOS 148 10/09/2016 0838   AST 23 10/09/2016 0838   ALT 26 10/09/2016 0838   BILITOT 0.54 10/09/2016 0838       No results found for: LABCA2  No components found for: LABCA125  No results for input(s): INR in the last 168 hours.  Urinalysis    Component Value Date/Time   COLORURINE ORANGE (A) 12/27/2015 1339   APPEARANCEUR CLOUDY (A) 12/27/2015 1339   LABSPEC  1.030 12/27/2015 1339   PHURINE 5.5 12/27/2015 1339   GLUCOSEU NEGATIVE 12/27/2015 1339   HGBUR NEGATIVE 12/27/2015 1339   BILIRUBINUR SMALL (A) 12/27/2015 1339   KETONESUR 15 (A) 12/27/2015 1339   PROTEINUR 100 (A) 12/27/2015 1339   NITRITE NEGATIVE 12/27/2015 1339   LEUKOCYTESUR TRACE (A) 12/27/2015 1339     STUDIES: US Breast Ltd Uni Left Inc Axilla  Result Date: 09/27/2016 CLINICAL DATA:  Left upper slightly inner breast area of palpable concern, which the patient felt after fall with trauma to the same part of the breast 3 weeks ago. EXAM: 2D DIGITAL DIAGNOSTIC BILATERAL MAMMOGRAM WITH CAD AND ADJUNCT TOMO ULTRASOUND LEFT BREAST COMPARISON:  Previous exam(s). ACR Breast Density Category b: There are scattered areas of fibroglandular density. FINDINGS: There are no suspicious masses, areas of architectural distortion or microcalcifications in the right breast. There is a large lobulated hyperdense mass in the left breast upper slightly inner quadrant, middle depth, at the site of palpable concern. Mammographic images were processed with CAD. On physical exam, there is a large firm non mobile mass in the upper left breast. Targeted ultrasound is performed, showing left breast 11:30 o'clock 4 cm from the nipple irregular mixed echogenicity mass which is taller than wide and extends to the subcutaneous tissues. The mass measures 4.9 by 4.3 by 3.9 cm. A small superficial fluid collection is seen overlying this mass, which may represent overlying hematoma. There is no sonographic evidence of left axillary lymphadenopathy. IMPRESSION: Suspicious left breast 11:30 o'clock mass measuring 4.9 cm in greatest dimension, for which ultrasound-guided core needle biopsy is recommended. No evidence of left axillary lymphadenopathy. No mammographic evidence of malignancy in the right breast. RECOMMENDATION: Ultrasound-guided core needle biopsy of the left breast. I have discussed the findings and recommendations  with the patient. Results were also provided in writing at the conclusion of the visit. If applicable, a reminder letter will be sent to the patient regarding the next appointment. BI-RADS CATEGORY  4: Suspicious. Electronically Signed   By: Fidela Salisbury M.D.   On: 09/27/2016 09:43   Mm Diag Breast Tomo Bilateral  Result Date: 09/27/2016 CLINICAL DATA:  Left upper slightly inner breast area of palpable concern, which the patient felt after fall with trauma to the same part of the breast 3 weeks ago. EXAM: 2D DIGITAL DIAGNOSTIC BILATERAL MAMMOGRAM WITH CAD AND ADJUNCT TOMO ULTRASOUND LEFT BREAST COMPARISON:  Previous exam(s). ACR Breast Density Category b: There  are scattered areas of fibroglandular density. FINDINGS: There are no suspicious masses, areas of architectural distortion or microcalcifications in the right breast. There is a large lobulated hyperdense mass in the left breast upper slightly inner quadrant, middle depth, at the site of palpable concern. Mammographic images were processed with CAD. On physical exam, there is a large firm non mobile mass in the upper left breast. Targeted ultrasound is performed, showing left breast 11:30 o'clock 4 cm from the nipple irregular mixed echogenicity mass which is taller than wide and extends to the subcutaneous tissues. The mass measures 4.9 by 4.3 by 3.9 cm. A small superficial fluid collection is seen overlying this mass, which may represent overlying hematoma. There is no sonographic evidence of left axillary lymphadenopathy. IMPRESSION: Suspicious left breast 11:30 o'clock mass measuring 4.9 cm in greatest dimension, for which ultrasound-guided core needle biopsy is recommended. No evidence of left axillary lymphadenopathy. No mammographic evidence of malignancy in the right breast. RECOMMENDATION: Ultrasound-guided core needle biopsy of the left breast. I have discussed the findings and recommendations with the patient. Results were also provided  in writing at the conclusion of the visit. If applicable, a reminder letter will be sent to the patient regarding the next appointment. BI-RADS CATEGORY  4: Suspicious. Electronically Signed   By: Fidela Salisbury M.D.   On: 09/27/2016 09:43   Mm Clip Placement Left  Result Date: 09/30/2016 CLINICAL DATA:  Status post ultrasound-guided core needle biopsy of a 4.9 cm mass in the 11:30 o'clock position of the left breast. EXAM: DIAGNOSTIC LEFT MAMMOGRAM POST ULTRASOUND BIOPSY COMPARISON:  Previous exam(s). FINDINGS: Mammographic images were obtained following ultrasound guided biopsy of the recently demonstrated 4.9 cm mass in the 11:30 o'clock position of the left breast. These demonstrate a coil shaped biopsy marker clip within the mass. IMPRESSION: Appropriate clip deployment following left breast ultrasound-guided core needle biopsy. Final Assessment: Post Procedure Mammograms for Marker Placement Electronically Signed   By: Claudie Revering M.D.   On: 09/30/2016 09:39   [place] Lt Breast Bx W Loc Dev 1st Lesion Img Bx Spec US Guide  Addendum Date: 10/01/2016   ADDENDUM REPORT: 10/01/2016 15:03 ADDENDUM: Pathology revealed GRADE III INVASIVE DUCTAL CARCINOMA, HIGH GRADE DUCTAL CARCINOMA IN SITU of the Left breast at the 11:30 o'clock location. This was found to be concordant by Dr. Claudie Revering. Pathology results were discussed with the patient by telephone. The patient reported doing well after the biopsy with tenderness at the site. Post biopsy instructions and care were reviewed and questions were answered. The patient was encouraged to call [facility] for any additional concerns. The patient was referred to [facility] at [facility] on October 09, 2016. Pathology results reported by Terie Purser, RN on 10/01/2016. Electronically Signed   By: Claudie Revering M.D.   On: 10/01/2016 15:03   Result Date:  10/01/2016 CLINICAL DATA:  4.9 cm mass in the 11:30 o'clock position of the left breast at recent mammography and ultrasound. Recent follow-up with marked bruising to that portion of the breast. The bruising has cleared. EXAM: ULTRASOUND GUIDED LEFT BREAST CORE NEEDLE BIOPSY COMPARISON:  Previous exam(s). FINDINGS: I met with the patient and we discussed the procedure of ultrasound-guided biopsy, including benefits and alternatives. We discussed the high likelihood of a successful procedure. We discussed the risks of the procedure, including infection, bleeding, tissue injury, clip migration, and inadequate sampling. Informed written consent was given. The usual  time-out protocol was performed immediately prior to the procedure. Using sterile technique and 1% Lidocaine as local anesthetic, under direct ultrasound visualization, a 12 gauge spring-loaded device was used to perform biopsy of the recently demonstrated 4.9 cm mass in the 11:30 o'clock position of the left breast using a inferomedial approach. At the conclusion of the procedure a coil shaped tissue marker clip was deployed into the biopsy cavity. Follow up 2 view mammogram was performed and dictated separately. IMPRESSION: Ultrasound guided biopsy of the 4.9 cm mass in the 11:30 o'clock position of the left breast. No apparent complications. Electronically Signed: By: Claudie Revering M.D. On: 09/30/2016 09:25    ELIGIBLE FOR AVAILABLE RESEARCH PROTOCOL: no  ASSESSMENT: 60 y.o. [employer], [place] woman status post left breast upper inner quadrant biopsy 09/30/2016 for a clinical T2 N0, stage II a invasive ductal carcinoma, grade 3, estrogen receptor positive, progesterone receptor negative, HER-2 not amplified, with an MIB-1 of 80%.  (1) genetics testing pending  (2) neoadjuvant chemotherapy to consist of cyclophosphamide and doxorubicin in dose dense fashion 4 followed by weekly paclitaxel 12   (3) definitive surgery to follow, with  reconstruction as appropriate  (4) adjuvant radiation as appropriate  (5) anti-estrogens to follow at the completion of local treatment  PLAN: We spent the better part of today's hour-long appointment discussing the biology of breast cancer in general, and the specifics of the patient's tumor in particular. We first reviewed the fact that cancer is not one disease but more than 100 different diseases and that it is important to keep them separate-- otherwise when friends and relatives discuss their own cancer experiences with [place] confusion can result. Similarly we explained that if breast cancer spreads to the bone or liver, the patient would not have bone cancer or liver cancer, but breast cancer in the bone and breast cancer in the liver: one cancer in three places-- not 3 different cancers which otherwise would have to be treated in 3 different ways.  We discussed the difference between local and systemic therapy. In terms of loco-regional treatment, lumpectomy plus radiation is equivalent to mastectomy as far as survival is concerned. In 2 recess case, because the tumor is so large, right now mastectomy might be necessary. Whenever if we can shrink the tumor preop then perhaps a lumpectomy could be performed with good cosmetic results. We noted that in terms of sequencing of treatments, whether systemic therapy or surgery is done first does not affect the ultimate outcome, and in her case because of the surgical concerns just discussed and because she will need time to get genetics testing results before she makes a definitive surgical decision, we recommend neoadjuvant systemic therapy.  We then discussed the rationale for systemic therapy. There is some risk that this cancer may have already spread to other parts of her body. Patients frequently ask at this point about bone scans, CAT scans and PET scans to find out if they have occult breast cancer somewhere else. The problem is that in early  stage disease we are much more likely to find false positives then true cancers and this would expose the patient to unnecessary procedures as well as unnecessary radiation. Scans cannot answer the question the patient really would like to know, which is whether she has microscopic disease elsewhere in her body. For those reasons we do not recommend them.  Of course we would proceed to aggressive evaluation of any symptoms that might suggest metastatic disease, but that is not  the case here.  Next we went over the options for systemic therapy which are anti-estrogens, anti-HER-2 immunotherapy, and chemotherapy. Fran does not meet criteria for anti-HER-2 immunotherapy. She is a fair candidate for anti-estrogens. However I am concerned that her tumor was progesterone receptor negative and only moderately estrogen receptor positive. We could send an Oncotype or Mammaprint as suggested by [facility] and we did discuss this both in conference and with the patient. However I would be uncomfortable if in this patient's the only systemic treatment was anti-estrogens given the fact that we know patients with progesterone receptor negative and not strongly estrogen receptor positive results do not get the same risk reduction as patient's or both estrogen and progesterone receptor strongly positive. My recommendation is that we proceed to chemotherapy and the patient is very much in agreement with this plan  The plan then will be to start chemotherapy, most likely 10/29/2015. She will see me a few days before that to discuss how to take her supportive treatment. Also she will have a breast MRI, and meet with our chemotherapy instruction nurse up prior to that visit. She had an echocardiogram 03/10/2017with an ejection fraction in the 55-60% and that will not need to be repeated.  The patient's [place] 02[place]07[place]. We will make sure that [name] is able to not only attend but fully  participate in that ceremony.  she has a good understanding of the overall plan. She agrees with it. She knows the goal of treatment in her case is cure. She will call with any problems that may develop before her next visit here.  Chauncey Cruel, MD   10/20/2016 3:33 PM Medical Oncology and Hematology [facility] [address] [place], [place] [address] Tel. [phone]    Fax. [phone]

## 2016-10-23 ENCOUNTER — Other Ambulatory Visit: Payer: BLUE CROSS/BLUE SHIELD

## 2016-10-24 ENCOUNTER — Other Ambulatory Visit: Payer: Self-pay | Admitting: Internal Medicine

## 2016-10-24 DIAGNOSIS — Z170 Estrogen receptor positive status [ER+]: Principal | ICD-10-CM

## 2016-10-24 DIAGNOSIS — C50912 Malignant neoplasm of unspecified site of left female breast: Secondary | ICD-10-CM

## 2016-10-25 ENCOUNTER — Ambulatory Visit: Payer: BLUE CROSS/BLUE SHIELD | Admitting: Oncology

## 2016-10-25 ENCOUNTER — Other Ambulatory Visit: Payer: BLUE CROSS/BLUE SHIELD

## 2016-10-27 ENCOUNTER — Ambulatory Visit
Admission: RE | Admit: 2016-10-27 | Discharge: 2016-10-27 | Disposition: A | Discharge status: Home / Self Care | Payer: BLUE CROSS/BLUE SHIELD | Source: Ambulatory Visit | Attending: Internal Medicine | Admitting: Internal Medicine

## 2016-10-27 DIAGNOSIS — Z170 Estrogen receptor positive status [ER+]: Principal | ICD-10-CM

## 2016-10-27 DIAGNOSIS — C50912 Malignant neoplasm of unspecified site of left female breast: Secondary | ICD-10-CM

## 2016-10-27 MED ORDER — GADOBENATE DIMEGLUMINE 529 MG/ML IV SOLN
20.0000 mL | Freq: Once | INTRAVENOUS | Status: AC | PRN
Start: 2016-10-27 — End: 2016-10-27
  Administered 2016-10-27: 20 mL via INTRAVENOUS

## 2016-10-28 ENCOUNTER — Ambulatory Visit: Payer: BLUE CROSS/BLUE SHIELD

## 2016-10-28 ENCOUNTER — Other Ambulatory Visit: Payer: BLUE CROSS/BLUE SHIELD

## 2016-11-11 ENCOUNTER — Ambulatory Visit: Payer: BLUE CROSS/BLUE SHIELD

## 2016-11-11 ENCOUNTER — Other Ambulatory Visit: Payer: BLUE CROSS/BLUE SHIELD

## 2016-11-18 ENCOUNTER — Other Ambulatory Visit: Payer: Self-pay | Admitting: Oncology

## 2016-11-25 ENCOUNTER — Ambulatory Visit: Payer: BLUE CROSS/BLUE SHIELD

## 2016-11-25 ENCOUNTER — Other Ambulatory Visit: Payer: BLUE CROSS/BLUE SHIELD

## 2016-12-09 ENCOUNTER — Telehealth (HOSPITAL_COMMUNITY): Payer: Self-pay | Admitting: Vascular Surgery

## 2016-12-09 ENCOUNTER — Ambulatory Visit: Payer: BLUE CROSS/BLUE SHIELD

## 2016-12-09 ENCOUNTER — Other Ambulatory Visit: Payer: BLUE CROSS/BLUE SHIELD

## 2016-12-09 NOTE — Telephone Encounter (Signed)
Called pt to make NP brst appt w/ ECHO/PT  is getting treatment elsewhere she will to be coming to [place]

## 2016-12-18 ENCOUNTER — Encounter: Payer: BLUE CROSS/BLUE SHIELD | Admitting: Genetic Counselor

## 2016-12-18 ENCOUNTER — Other Ambulatory Visit: Payer: BLUE CROSS/BLUE SHIELD

## 2017-03-21 HISTORY — PX: AUGMENTATION MAMMAPLASTY: SUR837

## 2017-04-05 HISTORY — PX: BREAST LUMPECTOMY: SHX2

## 2017-04-07 HISTORY — PX: BREAST REDUCTION SURGERY: SHX8

## 2017-04-07 HISTORY — PX: BREAST LUMPECTOMY WITH SENTINEL LYMPH NODE BIOPSY: SHX5597

## 2017-06-13 ENCOUNTER — Telehealth: Payer: Self-pay

## 2017-06-13 ENCOUNTER — Ambulatory Visit (HOSPITAL_BASED_OUTPATIENT_CLINIC_OR_DEPARTMENT_OTHER): Payer: BLUE CROSS/BLUE SHIELD | Admitting: Oncology

## 2017-06-13 VITALS — BP 149/96 | HR 108 | Temp 98.6°F | Resp 18 | Ht 69.0 in | Wt 203.0 lb

## 2017-06-13 DIAGNOSIS — C50212 Malignant neoplasm of upper-inner quadrant of left female breast: Secondary | ICD-10-CM

## 2017-06-13 DIAGNOSIS — Z170 Estrogen receptor positive status [ER+]: Secondary | ICD-10-CM | POA: Diagnosis not present

## 2017-06-13 NOTE — Progress Notes (Signed)
[place]  [phone] [phone]) [phone]     ID: TING CAGE DOB: May 02, 1956  MR#: [id number]  WUX#:[id number]  Patient Care Team: Chesley Noon, MD as PCP - General (Family Medicine) Excell Seltzer, MD as Consulting Physician (General Surgery) Lyndal Reggio, [name], MD as Consulting Physician (Oncology) Gery Pray, MD as Consulting Physician (Radiation Oncology) Collene Gobble, MD as Consulting Physician (Pulmonary Disease) Chauncey Cruel, MD OTHER MD:  CHIEF COMPLAINT: estrogen receptor positive breast cancer  CURRENT TREATMENT: neoadjuvant chemotherapy   BREAST CANCER HISTORY: From the original intake note:  "[name]" had a fall while traveling living to [place] in their RV and injure her left upper arm and  her left breast . Approximately 3 weeks later she noted a mass in the left breast upper inner quadrant. It was somewhat tender. She brought this to medical attention and on 09/27/2016 underwent bilateral diagnostic mammography with tomography [facility] The breast density was category B. The right breast was benign. In the left breast upper inner quadrant there was a large lobulated hyperdense mass which by exam was firm and nonmobile. By ultrasonography in the 11:30 o'clock radius 4 cm from the nipple there was a mass extending to the subcutaneous tissues and measuring 4.9 cm. There was a small superficial fluid collection overlying this consistent with a hematoma. The left axilla was benign.  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  The patient's subsequent history is as detailed below  INTERVAL HISTORY: [name] returns  today for follow-up of her estrogen receptor positive breast cancer accompanied by her husband. I have not seen her since 10/09/2016. At that time we proposed neoadjuvant chemotherapy and surgery. She tells me she really didn't want that and wanted to have surgery first so she sought a second opinion with Dr. Georgiann Cocker in [place]. It turned out that she also thought the patient would benefit from neoadjuvant chemotherapy and she proceeded to standard doxorubicin/cyclophosphamide for 4 cycles, then received a cycle of Taxol which was poorly tolerated, and was switched to Abraxane, the patient receiving by her recollection approximately 8 Abraxane doses before that being stopped due to neuropathy. She then proceeded to left lumpectomy with oncoplastic and right breast reduction on 04/07/2017, for a residual T2 N0, stage IIB tumor, 2 of 3 lymph nodes positive, 1 with micrometastatic deposits. There was some metaplastic squamous differentiation. Margins were negative. There was evidence of lymphovascular invasion.   The big surprise was that repeat prognostic panel showed this tumor to be HER-2 amplified, with a ratio of 2.0, number per cell 4.6.  [name] had a restaging PET scan postop which showed no evidence of metastatic or recurrent disease.  She is now ready to proceed to radiation, and it would be much more convenient for her to receive radiation here which is I believe the reason she is back today.   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 This is grade 1. She currently denies any unusual headaches, visual changes, nausea, vomiting, cough, phlegm production, pleurisy, or change in bowel or bladder habits. A detailed review of systems today was otherwise stable  PAST MEDICAL HISTORY: Past Medical History:  Diagnosis Date  . Anxiety   . CAP (community acquired pneumonia)   . Depression   . Malignant neoplasm of upper-inner  quadrant of left female breast ([place]) 10/01/2016  . Tobacco use     PAST SURGICAL HISTORY: Past Surgical History:  Procedure Laterality Date  . ABDOMINAL HYSTERECTOMY  2011   Laparoscopic  . CHOLECYSTECTOMY    . open reduction left ankle      FAMILY HISTORY Family History  Problem Relation Age of Onset  . Heart disease Mother   . Heart disease Father   . Cancer Sister        ovarian ca  the patient's father died at the age of 97 from a myocardial infarction. The patient's mother died at the age of 36 with congestive heart failure. The patient had one brother, 2 sisters. One of her sisters was diagnosed with ovarian cancer at age 79 and died at age 35 from that disease  GYNECOLOGIC HISTORY:  No LMP recorded. Patient has had a hysterectomy. Menarche age 61, first live birth age 61. The patient is GX P1. She stopped having periods approxiately 2008. She did not take hormone replacement. She used oral contraceptives for approxi 30 years with no complications.  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 Her husband [name] is retired from [employer]. Their daughter Neeva Trew lives in [place] and is Mudlogger of the [employer] in [employer].     ADVANCED DIRECTIVES: not in place   HEALTH MAINTENANCE: Social History  Substance Use Topics  . Smoking status: Current Every Day Smoker    Packs/day: 0.50    Years: 20.00  . Smokeless tobacco: Not on file  . Alcohol use No     Colonoscopy:  PAP:  Bone density:   No Known Allergies  Current Outpatient Prescriptions  Medication Sig Dispense Refill  . acetaminophen (TYLENOL) 325 MG tablet Take 650 mg by mouth every 6 (six) hours as needed for moderate pain.     . Biotin 1000 MCG tablet Take 2,000 mcg by mouth daily.    . busPIRone (BUSPAR) 30 MG tablet Take 15 mg by mouth daily.     Marland Kitchen ibuprofen (ADVIL,MOTRIN) 200 MG tablet Take 800 mg by mouth every 6 (six) hours as needed  for moderate pain.     . Multiple Vitamins-Minerals (EMERGEN-C IMMUNE PLUS) PACK Take 1 Package by mouth daily as needed (for immune).     Marland Kitchen PARoxetine (PAXIL) 40 MG tablet Take 40 mg by mouth every morning.    . Red Yeast Rice Extract ([employer]S RED YEAST RICE PO) Take 1 tablet by mouth daily.    Marland Kitchen Specialty Vitamins Products ([employer]S HAIR/SKIN/NAILS) TABS Take 1 tablet by mouth daily.     No current facility-administered medications for this visit.     OBJECTIVE: middle-aged white woman Who appears well  Vitals:   06/13/17 1100  BP: (!) 149/96  Pulse: (!) 108  Resp: 18  Temp: 98.6 F (37 C)  SpO2: 100%     Body mass index is 29.98 kg/m.    ECOG FS:0 - Asymptomatic  Sclerae unicteric, pupils round and equal Oropharynx clear and moist No cervical or supraclavicular adenopathy Lungs no rales or rhonchi Heart regular rate and rhythm Abd soft, nontender, positive bowel sounds MSK no focal spinal tenderness, no upper extremity lymphedema Neuro: nonfocal, well oriented, appropriate affect Breasts: The right breast is status post reduction mammoplasty. The left  breast is status post lumpectomy with oncoplastic. The overall cosmetic result is excellent. There are 2 areas of decreased since, one in each inframammary fold. These are very shallow, with a pink base, and appear to be healing by secondary intention very satisfactorily.. Both axillae are benign.  LAB RESULTS:  CMP     Component Value Date/Time   NA 139 10/09/2016 0838   K 4.3 10/09/2016 0838   CL 101 01/07/2016 0614   CO2 26 10/09/2016 0838   GLUCOSE 132 10/09/2016 0838   BUN 13.2 10/09/2016 0838   CREATININE 0.8 10/09/2016 0838   CALCIUM 9.5 10/09/2016 0838   PROT 7.4 10/09/2016 0838   ALBUMIN 3.8 10/09/2016 0838   AST 23 10/09/2016 0838   ALT 26 10/09/2016 0838   ALKPHOS 148 10/09/2016 0838   BILITOT 0.54 10/09/2016 0838   GFRNONAA >60 01/07/2016 0614   GFRAA >60 01/07/2016 0614    INo results found for: SPEP,  UPEP  Lab Results  Component Value Date   WBC 9.6 10/09/2016   NEUTROABS 7.1 (H) 10/09/2016   HGB 13.6 10/09/2016   HCT 41.7 10/09/2016   MCV 88.0 10/09/2016   PLT 286 10/09/2016      Chemistry      Component Value Date/Time   NA 139 10/09/2016 0838   K 4.3 10/09/2016 0838   CL 101 01/07/2016 0614   CO2 26 10/09/2016 0838   BUN 13.2 10/09/2016 0838   CREATININE 0.8 10/09/2016 0838      Component Value Date/Time   CALCIUM 9.5 10/09/2016 0838   ALKPHOS 148 10/09/2016 0838   AST 23 10/09/2016 0838   ALT 26 10/09/2016 0838   BILITOT 0.54 10/09/2016 0838       No results found for: LABCA2  No components found for: LABCA125  No results for input(s): INR in the last 168 hours.  Urinalysis    Component Value Date/Time   COLORURINE ORANGE (A) 12/27/2015 1339   APPEARANCEUR CLOUDY (A) 12/27/2015 1339   LABSPEC 1.030 12/27/2015 1339   PHURINE 5.5 12/27/2015 1339   GLUCOSEU NEGATIVE 12/27/2015 1339   HGBUR NEGATIVE 12/27/2015 1339   BILIRUBINUR SMALL (A) 12/27/2015 1339   KETONESUR 15 (A) 12/27/2015 1339   PROTEINUR 100 (A) 12/27/2015 1339   NITRITE NEGATIVE 12/27/2015 1339   LEUKOCYTESUR TRACE (A) 12/27/2015 1339     STUDIES: Outside records reviewed with the patient  ELIGIBLE FOR AVAILABLE RESEARCH PROTOCOL: no  ASSESSMENT: 61 y.o. [employer], [place] woman status post left breast upper inner quadrant biopsy 09/30/2016 for a clinical T2 N0, stage II a invasive ductal carcinoma, grade 3, estrogen receptor positive, progesterone receptor negative, HER-2 not amplified, with an MIB-1 of 80%.  (1) genetics testing pending  (2) neoadjuvant chemotherapy given between 10/30/2016 and 03/12/2017 consisting of cyclophosphamide and doxorubicin in dose dense fashion 4  followed by paclitaxel 1, then Abraxane (x8?)  further treatments discontinued because of neuropathy.  (3) status post left lumpectomy with oncoplasty and right reduction mammoplasty showing a residual T2 N1  invasive ductal carcinoma, with lymphovascular invasion, but negative margins; the tumor is now HER-2 positive.  (a) postop PET scan showed no evidence of metastatic disease.  (4) adjuvant trastuzumab/Pertuzumab pending  (5) adjuvant radiation to follow  (5) anti-estrogens to follow at the completion of local treatment  PLAN:  I spent approximately 35 minutes with [name] going over her situation and reviewing her treatment since her last visit here.  [name] was apologetic for having had her treatment elsewhere but  I reassured her that that is perfectly fine. She likes Dr. Georgiann Cocker and would like to continue under her care. I reassured her that she has had optimal treatment, and that her surgical results are also very good. I reassured her that the small areas of erosion in the inframammary fold bilaterally are healing nicely and likely will not have a significant impact on the final cosmetic result  It would be much more convenient for her to receive radiation treatments here. She has already had simulation elsewhere, and that very likely will have to be repeated. I have requested an appointment with radiation oncology for her next week to discuss this further.  I am not making [name] a return appointment with me at this point. She knows I will be glad to see her at any point in the future if we can be of any help    Chauncey Cruel, MD   06/14/2017 12:05 PM Medical Oncology and Hematology [facility] [place], [place] [address] Tel. [phone]    Fax. [phone]

## 2017-06-13 NOTE — Telephone Encounter (Signed)
8/24 los noted for patient to see dr Sondra Come, staff message to dr Jana Hakim to add a ref for rad/onc    [name]

## 2017-06-16 ENCOUNTER — Other Ambulatory Visit: Payer: Self-pay | Admitting: Oncology

## 2017-06-16 DIAGNOSIS — C50212 Malignant neoplasm of upper-inner quadrant of left female breast: Secondary | ICD-10-CM

## 2017-06-16 DIAGNOSIS — Z170 Estrogen receptor positive status [ER+]: Principal | ICD-10-CM

## 2017-06-17 ENCOUNTER — Encounter: Payer: Self-pay | Admitting: Radiation Oncology

## 2017-06-19 HISTORY — PX: SKIN GRAFT: SHX250

## 2017-06-20 ENCOUNTER — Encounter: Payer: Self-pay | Admitting: Radiation Oncology

## 2017-06-20 NOTE — Progress Notes (Signed)
Location of Breast Cancer: Left Breast UIQ Poorly Differentiated Invasive Ductal Carcinoma  Histology per Pathology Report:   04/07/17 1. Breast, left, lumpectomy:  - Metaplastic carcinoma, poorly differentiated (tubular differentiation score 3/3, nuclear pleomorphism score 3/3, mitotic rate score 3/3; total Nottingham score 9/9), 45 x 34 x 32 mm. - Carcinoma shows metaplastic squamous differentiation. - Ductal carcinoma in-situ, high grade, comedo type with comedo necrosis. - Margins are negative for carcinoma, closest are posterior and superior at 0.5 mm. - Lymphovascular invasion is present. - Biopsy site changes. - TNM stage pT2 (sn)pN1a M-not applicable; AJCC stage IIB. - Please see synoptic report below.  2. Sentinel lymph node, left axilla, excision:  - Two of three lymph nodes, positive for metastatic carcinoma (2/3). - One macrometastasis (2.5 mmm) and one micrometastasis (1 mm). - No extranodal extension present.  3. Breast, left, oncoplastic breast reduction:  - Benign skin and breast parenchyma, 77.7 gm.  4. Breast, right, reduction mammoplasty:  - Benign skin and breast parenchyma, 152.6 gm.  5. Breast, left inferior/anterior margin, excision:  - Benign breast parenchyma and one benign intramammary lymph node (0/1).  09/30/16 Diagnosis Breast, left, needle core biopsy, 11:30 o'clock - INVASIVE DUCTAL CARCINOMA. - HIGH GRADE DUCTAL CARCINOMA IN SITU. - SEE COMMENT.  Receptor Status: ER(20%, PR (0%), Her2-neu (amplified), Ki-()  Did patient present with symptoms (if so, please note symptoms) or was this found on screening mammography?: Patient had a fall while traveling living to [place] in their RV and injure her left upper arm and  her left breast . Approximately 3 weeks later she noted a mass in the left breast upper inner quadrant.   Past/Anticipated interventions by surgeon, if any: 04/07/17 - LEFT ONCOPLASTIC BREAST REDUCTION AND RIGHT BREAST REDUCTION  (Bilateral) and LEFT BREAST ONCOPLASTIC LUMPECTOMY WITH LEFT SENTINEL NODE BIOPSY (Left)  Past/Anticipated interventions by medical oncology, if any: neoadjuvant chemotherapy given between 10/30/2016 and 03/12/2017 consisting of cyclophosphamide and doxorubicin in dose dense fashion 4  followed by paclitaxel 1, then Abraxane (x8?)  further treatments discontinued because of neuropathy. Received first Perjeta and herceptin on Friday.  Lymphedema issues, if any:  no   Pain issues, if any:  no   SAFETY ISSUES:  Prior radiation? Had [facility] and [facility]  Pacemaker/ICD? no  Possible current pregnancy?no  Is the patient on methotrexate? no  Current Complaints / other details:  Patient is here with her husband.  She reports the skin under both breast is not healing.  She also has a lymph node that was seen on a PET scan that her radiation oncologist at [facility] recommended that it be removed.  Her medical oncologist was concerned about this so she would like a second opinion.    Hess, Karen R, RN 06/20/2017,8:40 AM   

## 2017-06-25 ENCOUNTER — Ambulatory Visit
Admission: RE | Admit: 2017-06-25 | Discharge: 2017-06-25 | Disposition: A | Discharge status: Home / Self Care | Payer: BLUE CROSS/BLUE SHIELD | Source: Ambulatory Visit | Attending: Radiation Oncology | Admitting: Radiation Oncology

## 2017-06-25 ENCOUNTER — Ambulatory Visit: Payer: BLUE CROSS/BLUE SHIELD

## 2017-06-25 ENCOUNTER — Encounter: Payer: Self-pay | Admitting: Radiation Oncology

## 2017-06-25 ENCOUNTER — Ambulatory Visit: Payer: BLUE CROSS/BLUE SHIELD | Admitting: Radiation Oncology

## 2017-06-25 VITALS — BP 149/92 | HR 85 | Temp 98.4°F | Ht 69.0 in | Wt 205.4 lb

## 2017-06-25 DIAGNOSIS — Z9889 Other specified postprocedural states: Secondary | ICD-10-CM | POA: Insufficient documentation

## 2017-06-25 DIAGNOSIS — Z170 Estrogen receptor positive status [ER+]: Secondary | ICD-10-CM | POA: Diagnosis present

## 2017-06-25 DIAGNOSIS — C50212 Malignant neoplasm of upper-inner quadrant of left female breast: Secondary | ICD-10-CM | POA: Diagnosis present

## 2017-06-25 DIAGNOSIS — Z79899 Other long term (current) drug therapy: Secondary | ICD-10-CM | POA: Diagnosis not present

## 2017-06-25 NOTE — Progress Notes (Signed)
Please see the Nurse Progress Note in the MD Initial Consult Encounter for this patient. 

## 2017-06-25 NOTE — Progress Notes (Addendum)
Radiation Oncology         [phone] ________________________________  Name: [name] MRN: [id number]  Date: 06/25/2017  DOB: October 27, 1955  Follow-Up Re-Consultation Visit Note  CC: Chesley Noon, MD  [name], [name], MD    ICD-10-CM   1. Malignant neoplasm of upper-inner quadrant of left breast in female, estrogen receptor positive ([place]) C50.212    Z17.0     Diagnosis:   Clinical Stage T3, Nx, Left Breast UIQ Poorly Differentiated Invasive Ductal Carcinoma, ER+ / PR- / Her2-, Grade III, status post neoadjuvant chemotherapy, status post left lumpectomy with oncoplasty and right reduction mammoplasty showing a residual T2 N1 invasive ductal carcinoma, with lymphovascular invasion, but negative margins; the tumor is now HER-2 positive.  Narrative:  The patient returns today for follow-up re-consultation.  The patient was previously seen for consultation in our multidisciplinary Breast Clinic on 10/09/2016. Since our last visit, the patient underwent neoadjuvant chemotherapy with Dr. Georgiann Cocker at [facility]. She received standard doxurubicin/cyclophosphamide for 4 cycles, then received a cycle of Taxol which was poorly tolerated, and was switched to Abraxane. The patient received, by her recollection, approximately 8 Abraxane doses before that being stopped due to neuropathy. She then proceeded with left breast lumpectomy with oncoplastic and right breast reduction on 04/07/2017. Final pathology showed residual pT2 N1, stage IIB tumor, 2 of 3 lymph nodes positive, 1 with micrometastatic deposits. There was some metaplastic squamous differentiation. Margins were negative. There was evidence of lymphovascular invasion. Repeat prognostic panel showed this tumor to be HER-2 amplified, with a ratio of 2.0, number per cell 4.6.  Restaging PET scan on 05/30/2017 showed no evidence of metastatic or recurrent disease. There is a minimal activity with a left axillary lymph node, favored to be reactive.  This lymph node is 1.4 x 0.8 cm in size with SUV max of 1.9.   She received her first Perjeta and herceptin on Friday, 06/20/2017. She will be receiving this treatment every three weeks.   She has had a lymphedema class and has not had any issues with lymphedema. She denies any issues in raising her arms above her head.  She has had some problems with delayed wound healing in both breasts.                             ALLERGIES:  has No Known Allergies.  Meds: Current Outpatient Prescriptions  Medication Sig Dispense Refill  . acetaminophen (TYLENOL) 325 MG tablet Take 650 mg by mouth every 6 (six) hours as needed for moderate pain.     Marland Kitchen ALPRAZolam (XANAX) 0.25 MG tablet Take 1 po BID    . busPIRone (BUSPAR) 30 MG tablet Take 15 mg by mouth daily.     . diphenhydrAMINE (BENADRYL) 25 mg capsule Take by mouth.    Marland Kitchen ibuprofen (ADVIL,MOTRIN) 200 MG tablet Take 800 mg by mouth every 6 (six) hours as needed for moderate pain.     Marland Kitchen letrozole (FEMARA) 2.5 MG tablet Take by mouth.    . lidocaine-prilocaine (EMLA) cream Apply topically one hour prior to procedure.    . nystatin (MYCOSTATIN) 100000 UNIT/ML suspension Take by mouth.    Marland Kitchen PARoxetine (PAXIL) 40 MG tablet Take 40 mg by mouth every morning.    . prochlorperazine (COMPAZINE) 10 MG tablet Take by mouth.    . ranitidine (ZANTAC) 150 MG tablet Take by mouth.    Marland Kitchen tiZANidine (ZANAFLEX) 4 MG tablet TAKE 1  TABLET BY MOUTH THREE TIMES A DAY    . traMADol (ULTRAM) 50 MG tablet Take by mouth.    . Biotin 1000 MCG tablet Take 2,000 mcg by mouth daily.    . Multiple Vitamins-Minerals (EMERGEN-C IMMUNE PLUS) PACK Take 1 Package by mouth daily as needed (for immune).     . Red Yeast Rice Extract (CVS RED YEAST RICE PO) Take 1 tablet by mouth daily.    Marland Kitchen Specialty Vitamins Products (CVS HAIR/SKIN/NAILS) TABS Take 1 tablet by mouth daily.     No current facility-administered medications for this encounter.     Physical Findings: The patient is in  no acute distress. Patient is alert and oriented.  height is 5' 9"  (1.753 m) and weight is 205 lb 6.4 oz (93.2 kg). Her oral temperature is 98.4 F (36.9 C). Her blood pressure is 149/92 (abnormal) and her pulse is 85. Her oxygen saturation is 99%. .  Lungs are clear to auscultation bilaterally. Heart has regular rate and rhythm. No palpable cervical, supraclavicular, or axillary adenopathy.  Breast exam shows right breast with reduction mammoplasty scars in the inferior aspect of the breast. Bandages in place which was not removed. Left breast has reduction mammoplasty scars in place with a bandage in the inframammary fold area which was removed and shows wound which continues to heal. It appears to be superficial at this time with no signs of infection. Overall excellent cosmetic result at this time.  Lab Findings: Lab Results  Component Value Date   WBC 9.6 10/09/2016   HGB 13.6 10/09/2016   HCT 41.7 10/09/2016   MCV 88.0 10/09/2016   PLT 286 10/09/2016    Radiographic Findings: No results found.  Impression:  Clinical Stage T3, Nx, Left Breast UIQ Poorly Differentiated Invasive Ductal Carcinoma, ER+ / PR- / Her2-, Grade III, status post left lumpectomy with oncoplasty and right reduction mammoplasty showing a residual T2 N1 invasive ductal carcinoma, with lymphovascular invasion, but negative margins; the tumor is now HER-2 positive.  We discussed the risks, benefits, and side effects of radiotherapy. We discussed that radiation would take approximately 5 1/2 - 6 1/2 weeks to complete. We spoke about acute effects including skin irritation and fatigue as well as much less common late effects including injury to internal organs of the chest. We spoke about the latest technology that is used to minimize the risk of late effects for breast cancer patients undergoing radiotherapy. We discussed the potential for the use of breath hold technique to reduce risk of side effects.  . The patient is   Consent form was reviewed and signed.    Since she did have positive nodes on her sentinel node procedure and did not have full axillary dissection she will have treatment of the full axilla with her breast radiation treatment  We will wait 3-4 weeks before beginning radiation treatment to allow the patient's breast wound more time to heal. She will continue to follow with her plastic surgeon and I will send them a copy of our note from today.  In the interim, she will continue receiving Perjeta and herceptin every three weeks and plans to transfer her medical oncology care to Dr. Jana Hakim.  Plan: Patient would like to receive her adjuvant radiation therapy at our facility which is much closer to her residence in [place]. The patient will return in 2 weeks so we can re-assess skin healing progress. We will likely schedule CT simulation and treatment planning appointment after this next visit.  We will await her plastic surgeon's final okay before proceeding with radiation treatment.   -----------------------------------  [name] Promise, PhD, MD  This document serves as a record of services personally performed by Gery Pray, MD. It was created on his behalf by Arlyce Harman, a trained medical scribe. The creation of this record is based on the scribe's personal observations and the provider's statements to them. This document has been checked and approved by the attending provider.

## 2017-06-26 ENCOUNTER — Other Ambulatory Visit: Payer: Self-pay | Admitting: Oncology

## 2017-06-30 ENCOUNTER — Telehealth: Payer: Self-pay

## 2017-06-30 ENCOUNTER — Telehealth: Payer: Self-pay | Admitting: Oncology

## 2017-06-30 NOTE — Telephone Encounter (Signed)
msg sent to schedulers to schedule pt for infusion on, or as close to as possible, 9/21

## 2017-06-30 NOTE — Telephone Encounter (Signed)
sw pt to confirm infusion appt 9/21 at 11 am per sch msg

## 2017-07-07 ENCOUNTER — Other Ambulatory Visit: Payer: Self-pay | Admitting: Oncology

## 2017-07-07 NOTE — Progress Notes (Signed)
[name] has decided to receive her Herceptin and Perjeta here  She had an echocardiogram through [place] and 06/18/2017  Interpretation Summary A limited two-dimensional transthoracic echocardiogram was performed. There is a small size pericardial effusion. The aortic valve is not well visualized, but is grossly normal. The left ventricle is normal in size. There is normal left ventricular wall thickness. The left ventricular ejection fraction is normal (55-60%). The left ventricular wall motion is normal.  She will see my physician's assistant in my absence 07/11/2017. She will need repeat appointments every 21 days and then appointment to see me in 6 weeks from that date. 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

## 2017-07-08 ENCOUNTER — Telehealth: Payer: Self-pay | Admitting: Oncology

## 2017-07-08 NOTE — Telephone Encounter (Signed)
Added lab/GM to 9/21 infusion - date/time per GM. Also added 11/2. Spoke with patient - patient to get updated schedule 9/21.

## 2017-07-09 ENCOUNTER — Other Ambulatory Visit: Payer: Self-pay | Admitting: Oncology

## 2017-07-09 ENCOUNTER — Telehealth: Payer: Self-pay | Admitting: Oncology

## 2017-07-09 NOTE — Progress Notes (Unsigned)
[name]  [phone] [phone]     ID: [name] DOB: [birth year]  MR#: [id number]  AGT#:[id number]  Patient Care Team: Debra Noon, MD as PCP - General (Family Medicine) Debra Seltzer, MD as Consulting Physician (General Surgery) [name], Debra Dad, MD as Consulting Physician (Oncology) Debra Pray, MD as Consulting Physician (Radiation Oncology) Debra Gobble, MD as Consulting Physician (Pulmonary Disease) Debra Cruel, MD OTHER MD:  CHIEF COMPLAINT: estrogen receptor positive breast cancer  CURRENT TREATMENT: neoadjuvant chemotherapy   BREAST CANCER HISTORY: From the original intake note:  "[name]" had a fall while traveling living to [place] in Debra RV and injure her left upper arm and  her left breast . Approximately 3 weeks later she noted a mass in the left breast upper inner quadrant. It was somewhat tender. She brought this to medical attention and on 09/27/2016 underwent bilateral diagnostic mammography with tomography [facility] The breast density was category B. The right breast was benign. In the left breast upper inner quadrant there was a large lobulated hyperdense mass which by exam was firm and nonmobile. By ultrasonography in the 11:30 o'clock radius 4 cm from the nipple there was a mass extending to the subcutaneous tissues and measuring 4.9 cm. There was a small superficial fluid collection overlying this consistent with a hematoma. The left axilla was benign.  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  The patient's subsequent history is as detailed below  INTERVAL HISTORY: [name] returns  [facility]ays for follow-up of her estrogen receptor positive breast cancer accompanied by her husband. I have not seen her since 10/09/2016. At that time we proposed neoadjuvant chemotherapy and surgery. She tells me she really didn't want that and wanted to have surgery first so she sought a second opinion with [name] in [place]. It turned out that she also thought the patient would benefit from neoadjuvant chemotherapy and she proceeded to standard doxorubicin/cyclophosphamide for 4 cycles, then received a cycle of Taxol which was poorly tolerated, and was switched to Abraxane, the patient receiving by her recollection approximately 8 Abraxane doses before that being stopped due to neuropathy. She then proceeded to left lumpectomy with oncoplastic and right breast reduction on 04/07/2017, for a residual T2 N0, stage IIB tumor, 2 of 3 lymph nodes positive, 1 with micrometastatic deposits. There was some metaplastic squamous differentiation. Margins were negative. There was evidence of lymphovascular invasion.   The big surprise was that repeat prognostic panel showed this tumor to be HER-2 amplified, with a ratio of 2.0, number per cell 4.6.  [name] had a restaging PET scan postop which showed no evidence of metastatic or recurrent disease.  She is now ready to proceed to radiation, and it would be much more convenient for her to receive radiation here which is I believe the reason she is back Hays.   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 This is grade 1. She currently denies any unusual headaches, visual changes, nausea, vomiting, cough, phlegm production, pleurisy, or change in bowel or bladder habits. A detailed review of systems Hays was otherwise stable  PAST MEDICAL HISTORY: Past Medical History:  Diagnosis Date  . Anxiety   . CAP (community acquired pneumonia)   . Depression   . Malignant neoplasm of upper-inner  quadrant of left female breast ([place]) 10/01/2016  . Tobacco use     PAST SURGICAL HISTORY: Past Surgical History:  Procedure Laterality Date  . ABDOMINAL HYSTERECTOMY  2011   Laparoscopic  . BREAST LUMPECTOMY WITH SENTINEL LYMPH NODE BIOPSY Left 04/07/2017  . BREAST REDUCTION SURGERY Left 04/07/2017  . CHOLECYSTECTOMY    . open reduction left ankle    . SKIN GRAFT Left 06/19/2017   bil skin grafts to breasts    FAMILY HISTORY Family History  Problem Relation Age of Onset  . Heart disease Mother   . Heart disease Father   . Cancer Sister        ovarian ca  the patient's father died at the age of 52 from a myocardial infarction. The patient's mother died at the age of 31 with congestive heart failure. The patient had one brother, 2 sisters. One of her sisters was diagnosed with ovarian cancer at age 9 and died at age 26 from that disease  GYNECOLOGIC HISTORY:  No LMP recorded. Patient has had a hysterectomy. Menarche age 36, first live birth age 51. The patient is GX P1. She stopped having periods approxiately 2008. She did not take hormone replacement. She used oral contraceptives for approxi 30 years with no complications.  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 Her husband [name]. [name] and is [employer] of the [employer] in [place].     ADVANCED DIRECTIVES: not in place   HEALTH MAINTENANCE: Social History  Substance Use Topics  . Smoking status: Former Smoker    Packs/day: 0.50    Years: 20.00    Quit date: 10/21/2016  . Smokeless tobacco: Never Used  . Alcohol use No     Colonoscopy:  PAP:  Bone density:   No Known Allergies  Current Outpatient Prescriptions  Medication Sig Dispense Refill  . acetaminophen (TYLENOL) 325 MG tablet Take 650 mg by mouth every 6 (six) hours as needed for moderate pain.     Marland Kitchen ALPRAZolam (XANAX) 0.25 MG  tablet Take 1 po BID    . Biotin 1000 MCG tablet Take 2,000 mcg by mouth daily.    . busPIRone (BUSPAR) 30 MG tablet Take 15 mg by mouth daily.     . diphenhydrAMINE (BENADRYL) 25 mg capsule Take by mouth.    Marland Kitchen ibuprofen (ADVIL,MOTRIN) 200 MG tablet Take 800 mg by mouth every 6 (six) hours as needed for moderate pain.     Marland Kitchen letrozole (FEMARA) 2.5 MG tablet Take by mouth.    . lidocaine-prilocaine (EMLA) cream Apply topically one hour prior to procedure.    . Multiple Vitamins-Minerals (EMERGEN-C IMMUNE PLUS) PACK Take 1 Package by mouth daily as needed (for immune).     . nystatin (MYCOSTATIN) 100000 UNIT/ML suspension Take by mouth.    Marland Kitchen PARoxetine (PAXIL) 40 MG tablet Take 40 mg by mouth every morning.    . prochlorperazine (COMPAZINE) 10 MG tablet Take by mouth.    . ranitidine (ZANTAC) 150 MG tablet Take by mouth.    . Red Yeast Rice Extract ([employer]S RED YEAST RICE PO) Take 1 tablet by mouth daily.    Marland Kitchen Specialty Vitamins Products ([employer]S HAIR/SKIN/NAILS) TABS Take 1 tablet by mouth daily.    Marland Kitchen  tiZANidine (ZANAFLEX) 4 MG tablet TAKE 1 TABLET BY MOUTH THREE TIMES A DAY    . traMADol (ULTRAM) 50 MG tablet Take by mouth.     No current facility-administered medications for this visit.     OBJECTIVE: middle-aged white woman Who appears well  There were no vitals filed for this visit.   There is no height or weight on file to calculate BMI.    ECOG FS:0 - Asymptomatic  Sclerae unicteric, pupils round and equal Oropharynx clear and moist No cervical or supraclavicular adenopathy Lungs no rales or rhonchi Heart regular rate and rhythm Abd soft, nontender, positive bowel sounds MSK no focal spinal tenderness, no upper extremity lymphedema Neuro: nonfocal, well oriented, appropriate affect Breasts: The right breast is status post reduction mammoplasty. The left breast is status post lumpectomy with oncoplastic. The overall cosmetic result is excellent. There are 2 areas of decreased since, one in  each inframammary fold. These are very shallow, with a pink base, and appear to be healing by secondary intention very satisfactorily.. Both axillae are benign.  LAB RESULTS:  CMP     Component Value Date/Time   NA 139 10/09/2016 0838   K 4.3 10/09/2016 0838   CL 101 01/07/2016 0614   CO2 26 10/09/2016 0838   GLUCOSE 132 10/09/2016 0838   BUN 13.2 10/09/2016 0838   CREATININE 0.8 10/09/2016 0838   CALCIUM 9.5 10/09/2016 0838   PROT 7.4 10/09/2016 0838   ALBUMIN 3.8 10/09/2016 0838   AST 23 10/09/2016 0838   ALT 26 10/09/2016 0838   ALKPHOS 148 10/09/2016 0838   BILITOT 0.54 10/09/2016 0838   GFRNONAA >60 01/07/2016 0614   GFRAA >60 01/07/2016 0614    INo results found for: SPEP, UPEP  Lab Results  Component Value Date   WBC 9.6 10/09/2016   NEUTROABS 7.1 (H) 10/09/2016   HGB 13.6 10/09/2016   HCT 41.7 10/09/2016   MCV 88.0 10/09/2016   PLT 286 10/09/2016      Chemistry      Component Value Date/Time   NA 139 10/09/2016 0838   K 4.3 10/09/2016 0838   CL 101 01/07/2016 0614   CO2 26 10/09/2016 0838   BUN 13.2 10/09/2016 0838   CREATININE 0.8 10/09/2016 0838      Component Value Date/Time   CALCIUM 9.5 10/09/2016 0838   ALKPHOS 148 10/09/2016 0838   AST 23 10/09/2016 0838   ALT 26 10/09/2016 0838   BILITOT 0.54 10/09/2016 0838       No results found for: LABCA2  No components found for: LABCA125  No results for input(s): INR in the last 168 hours.  Urinalysis    Component Value Date/Time   COLORURINE ORANGE (A) 12/27/2015 1339   APPEARANCEUR CLOUDY (A) 12/27/2015 1339   LABSPEC 1.030 12/27/2015 1339   PHURINE 5.5 12/27/2015 1339   GLUCOSEU NEGATIVE 12/27/2015 1339   HGBUR NEGATIVE 12/27/2015 1339   BILIRUBINUR SMALL (A) 12/27/2015 1339   KETONESUR 15 (A) 12/27/2015 1339   PROTEINUR 100 (A) 12/27/2015 1339   NITRITE NEGATIVE 12/27/2015 1339   LEUKOCYTESUR TRACE (A) 12/27/2015 1339     STUDIES: Outside records reviewed with the  patient  ELIGIBLE FOR AVAILABLE RESEARCH PROTOCOL: no  ASSESSMENT: 61 y.o. [employer], [place] woman status post left breast upper inner quadrant biopsy 09/30/2016 for a clinical T2 N0, stage II a invasive ductal carcinoma, grade 3, estrogen receptor positive, progesterone receptor negative, HER-2 not amplified, with an MIB-1 of 80%.  (1) genetics  testing pending  (2) neoadjuvant chemotherapy given between 10/30/2016 and 03/12/2017 consisting of cyclophosphamide and doxorubicin in dose dense fashion 4  followed by paclitaxel 1, then Abraxane (x8?)  further treatments discontinued because of neuropathy.  (3) status post left lumpectomy with oncoplasty and right reduction mammoplasty showing a residual T2 N1 invasive ductal carcinoma, with lymphovascular invasion, but negative margins; the tumor is now HER-2 positive.  (a) postop PET scan showed no evidence of metastatic disease.  (4) adjuvant trastuzumab/Pertuzumab pending  (5) adjuvant radiation to follow  (5) anti-estrogens to follow at the completion of local treatment  PLAN:  I spent approximately 35 minutes with [name] going over her situation and reviewing her treatment since her last visit here.  [name] was apologetic for having had her treatment elsewhere but I reassured her that that is perfectly fine. She likes [name] and would like to continue under her care. I reassured her that she has had optimal treatment, and that her surgical results are also very good. I reassured her that the small areas of erosion in the inframammary fold bilaterally are healing nicely and likely will not have a significant impact on the final cosmetic result  It would be much more convenient for her to receive radiation treatments here. She has already had simulation elsewhere, and that very likely will have to be repeated. I have requested an appointment with radiation oncology for her next week to discuss this further.  I am not making [name] a  return appointment with me at this point. She knows I will be glad to see her at any point in the future if we can be of any help    Debra Cruel, MD   07/09/2017 2:15 PM Medical Oncology and Hematology [facility] [place], [place] [address] Tel. [phone]    Fax. [phone]

## 2017-07-09 NOTE — Telephone Encounter (Signed)
Called patient to see when she can start radiation.  She said her incisions are healing well.  She has an appointment with Dr. Harl Bowie on 07/25/17 and will call us afterwards if she is released to start radiation.

## 2017-07-10 ENCOUNTER — Other Ambulatory Visit: Payer: Self-pay | Admitting: Adult Health

## 2017-07-10 DIAGNOSIS — Z170 Estrogen receptor positive status [ER+]: Principal | ICD-10-CM

## 2017-07-10 DIAGNOSIS — C50212 Malignant neoplasm of upper-inner quadrant of left female breast: Secondary | ICD-10-CM

## 2017-07-11 ENCOUNTER — Ambulatory Visit (HOSPITAL_BASED_OUTPATIENT_CLINIC_OR_DEPARTMENT_OTHER): Payer: BLUE CROSS/BLUE SHIELD

## 2017-07-11 ENCOUNTER — Encounter: Payer: Self-pay | Admitting: Adult Health

## 2017-07-11 ENCOUNTER — Ambulatory Visit (HOSPITAL_BASED_OUTPATIENT_CLINIC_OR_DEPARTMENT_OTHER): Payer: BLUE CROSS/BLUE SHIELD | Admitting: Adult Health

## 2017-07-11 ENCOUNTER — Telehealth: Payer: Self-pay

## 2017-07-11 ENCOUNTER — Other Ambulatory Visit (HOSPITAL_BASED_OUTPATIENT_CLINIC_OR_DEPARTMENT_OTHER): Payer: BLUE CROSS/BLUE SHIELD

## 2017-07-11 VITALS — BP 154/77 | HR 100 | Temp 98.6°F | Resp 19 | Ht 69.0 in | Wt 202.8 lb

## 2017-07-11 VITALS — BP 132/78 | HR 75 | Resp 18

## 2017-07-11 DIAGNOSIS — C50212 Malignant neoplasm of upper-inner quadrant of left female breast: Secondary | ICD-10-CM | POA: Diagnosis not present

## 2017-07-11 DIAGNOSIS — Z170 Estrogen receptor positive status [ER+]: Secondary | ICD-10-CM | POA: Diagnosis not present

## 2017-07-11 DIAGNOSIS — Z79811 Long term (current) use of aromatase inhibitors: Secondary | ICD-10-CM | POA: Diagnosis not present

## 2017-07-11 DIAGNOSIS — Z5112 Encounter for antineoplastic immunotherapy: Secondary | ICD-10-CM | POA: Diagnosis not present

## 2017-07-11 LAB — CBC WITH DIFFERENTIAL/PLATELET
BASO%: 1 % (ref 0.0–2.0)
BASOS ABS: 0.1 10*3/uL (ref 0.0–0.1)
EOS ABS: 0.3 10*3/uL (ref 0.0–0.5)
EOS%: 3.2 % (ref 0.0–7.0)
HCT: 41.7 % (ref 34.8–46.6)
HEMOGLOBIN: 13.7 g/dL (ref 11.6–15.9)
LYMPH#: 0.9 10*3/uL (ref 0.9–3.3)
LYMPH%: 9.6 % — ABNORMAL LOW (ref 14.0–49.7)
MCH: 27.7 pg (ref 25.1–34.0)
MCHC: 32.9 g/dL (ref 31.5–36.0)
MCV: 84 fL (ref 79.5–101.0)
MONO#: 0.4 10*3/uL (ref 0.1–0.9)
MONO%: 4.4 % (ref 0.0–14.0)
NEUT%: 81.8 % — ABNORMAL HIGH (ref 38.4–76.8)
NEUTROS ABS: 7.6 10*3/uL — AB (ref 1.5–6.5)
Platelets: 274 10*3/uL (ref 145–400)
RBC: 4.96 10*6/uL (ref 3.70–5.45)
RDW: 16.1 % — AB (ref 11.2–14.5)
WBC: 9.2 10*3/uL (ref 3.9–10.3)

## 2017-07-11 LAB — COMPREHENSIVE METABOLIC PANEL
ALBUMIN: 4 g/dL (ref 3.5–5.0)
ALK PHOS: 143 U/L (ref 40–150)
ALT: 19 U/L (ref 0–55)
AST: 16 U/L (ref 5–34)
Anion Gap: 9 mEq/L (ref 3–11)
BUN: 13.5 mg/dL (ref 7.0–26.0)
CALCIUM: 10.7 mg/dL — AB (ref 8.4–10.4)
CHLORIDE: 106 meq/L (ref 98–109)
CO2: 24 mEq/L (ref 22–29)
Creatinine: 0.8 mg/dL (ref 0.6–1.1)
EGFR: 78 mL/min/{1.73_m2} — AB (ref >90)
Glucose: 127 mg/dl (ref 70–140)
POTASSIUM: 5.3 meq/L — AB (ref 3.5–5.1)
SODIUM: 139 meq/L (ref 136–145)
Total Bilirubin: 0.34 mg/dL (ref 0.20–1.20)
Total Protein: 7.1 g/dL (ref 6.4–8.3)

## 2017-07-11 MED ORDER — SODIUM CHLORIDE 0.9 % IV SOLN
420.0000 mg | Freq: Once | INTRAVENOUS | Status: AC
  Administered 2017-07-11: 420 mg via INTRAVENOUS
  Filled 2017-07-11: qty 14

## 2017-07-11 MED ORDER — HEPARIN SOD (PORK) LOCK FLUSH 100 UNIT/ML IV SOLN
500.0000 [IU] | Freq: Once | INTRAVENOUS | Status: AC | PRN
  Administered 2017-07-11: 500 [IU]
  Filled 2017-07-11: qty 5

## 2017-07-11 MED ORDER — ACETAMINOPHEN 325 MG PO TABS
ORAL_TABLET | ORAL | Status: AC
  Filled 2017-07-11: qty 2

## 2017-07-11 MED ORDER — DIPHENHYDRAMINE HCL 25 MG PO CAPS
25.0000 mg | ORAL_CAPSULE | Freq: Once | ORAL | Status: AC
  Administered 2017-07-11: 25 mg via ORAL

## 2017-07-11 MED ORDER — SODIUM CHLORIDE 0.9% FLUSH
10.0000 mL | INTRAVENOUS | Status: DC | PRN
  Administered 2017-07-11: 10 mL
  Filled 2017-07-11: qty 10

## 2017-07-11 MED ORDER — TRASTUZUMAB CHEMO 150 MG IV SOLR
6.0000 mg/kg | Freq: Once | INTRAVENOUS | Status: AC
  Administered 2017-07-11: 567 mg via INTRAVENOUS
  Filled 2017-07-11: qty 27

## 2017-07-11 MED ORDER — CHOLESTYRAMINE 4 G PO PACK: 4.0000 g | PACK | Freq: Three times a day (TID) | ORAL | 12 refills | Status: DC

## 2017-07-11 MED ORDER — SODIUM CHLORIDE 0.9 % IV SOLN
Freq: Once | INTRAVENOUS | Status: AC
  Administered 2017-07-11: 11:00:00 via INTRAVENOUS

## 2017-07-11 MED ORDER — DIPHENHYDRAMINE HCL 25 MG PO CAPS
ORAL_CAPSULE | ORAL | Status: AC
Start: 2017-07-11 — End: 2017-07-11
  Filled 2017-07-11: qty 1

## 2017-07-11 MED ORDER — ACETAMINOPHEN 325 MG PO TABS
650.0000 mg | ORAL_TABLET | Freq: Once | ORAL | Status: AC
  Administered 2017-07-11: 650 mg via ORAL

## 2017-07-11 NOTE — Patient Instructions (Signed)
[facility] Discharge Instructions for Patients Receiving Chemotherapy  Today you received the following chemotherapy agents: Herceptin and Perjeta  To help prevent nausea and vomiting after your treatment, we encourage you to take your nausea medication as directed by your doctor. If you develop nausea and vomiting that is not controlled by your nausea medication, call the clinic.   BELOW ARE SYMPTOMS THAT SHOULD BE REPORTED IMMEDIATELY:  *FEVER GREATER THAN 100.5 F  *CHILLS WITH OR WITHOUT FEVER  NAUSEA AND VOMITING THAT IS NOT CONTROLLED WITH YOUR NAUSEA MEDICATION  *UNUSUAL SHORTNESS OF BREATH  *UNUSUAL BRUISING OR BLEEDING  TENDERNESS IN MOUTH AND THROAT WITH OR WITHOUT PRESENCE OF ULCERS  *URINARY PROBLEMS  *BOWEL PROBLEMS  UNUSUAL RASH Items with * indicate a potential emergency and should be followed up as soon as possible.  Feel free to call the clinic should you have any questions or concerns. The clinic phone number is [phone].  Please show the [facility] at check-in to the Emergency Department and triage nurse.    Trastuzumab injection for infusion What is this medicine? TRASTUZUMAB (tras TOO zoo mab) is a monoclonal antibody. It is used to treat breast cancer and stomach cancer. This medicine may be used for other purposes; ask your health care provider or pharmacist if you have questions. COMMON BRAND NAME(S): Herceptin What should I tell my health care provider before I take this medicine? They need to know if you have any of these conditions: -heart disease -heart failure -lung or breathing disease, like asthma -an unusual or allergic reaction to trastuzumab, benzyl alcohol, or other medications, foods, dyes, or preservatives -pregnant or trying to get pregnant -breast-feeding How should I use this medicine? This drug is given as an infusion into a vein. It is administered in a hospital or clinic by a specially trained health  care professional. Talk to your pediatrician regarding the use of this medicine in children. This medicine is not approved for use in children. Overdosage: If you think you have taken too much of this medicine contact a poison control center or emergency room at once. NOTE: This medicine is only for you. Do not share this medicine with others. What if I miss a dose? It is important not to miss a dose. Call your doctor or health care professional if you are unable to keep an appointment. What may interact with this medicine? This medicine may interact with the following medications: -certain types of chemotherapy, such as daunorubicin, doxorubicin, epirubicin, and idarubicin This list may not describe all possible interactions. Give your health care provider a list of all the medicines, herbs, non-prescription drugs, or dietary supplements you use. Also tell them if you smoke, drink alcohol, or use illegal drugs. Some items may interact with your medicine. What should I watch for while using this medicine? Visit your doctor for checks on your progress. Report any side effects. Continue your course of treatment even though you feel ill unless your doctor tells you to stop. Call your doctor or health care professional for advice if you get a fever, chills or sore throat, or other symptoms of a cold or flu. Do not treat yourself. Try to avoid being around people who are sick. You may experience fever, chills and shaking during your first infusion. These effects are usually mild and can be treated with other medicines. Report any side effects during the infusion to your health care professional. Fever and chills usually do not happen with later infusions.  Do not become pregnant while taking this medicine or for 7 months after stopping it. Women should inform their doctor if they wish to become pregnant or think they might be pregnant. Women of child-bearing potential will need to have a negative pregnancy  test before starting this medicine. There is a potential for serious side effects to an unborn child. Talk to your health care professional or pharmacist for more information. Do not breast-feed an infant while taking this medicine or for 7 months after stopping it. Women must use effective birth control with this medicine. What side effects may I notice from receiving this medicine? Side effects that you should report to your doctor or health care professional as soon as possible: -allergic reactions like skin rash, itching or hives, swelling of the face, lips, or tongue -chest pain or palpitations -cough -dizziness -feeling faint or lightheaded, falls -fever -general ill feeling or flu-like symptoms -signs of worsening heart failure like breathing problems; swelling in your legs and feet -unusually weak or tired Side effects that usually do not require medical attention (report to your doctor or health care professional if they continue or are bothersome): -bone pain -changes in taste -diarrhea -joint pain -nausea/vomiting -weight loss This list may not describe all possible side effects. Call your doctor for medical advice about side effects. You may report side effects to FDA at 1-800-FDA-1088. Where should I keep my medicine? This drug is given in a hospital or clinic and will not be stored at home. NOTE: This sheet is a summary. It may not cover all possible information. If you have questions about this medicine, talk to your doctor, pharmacist, or health care provider.  2018 Elsevier/Gold Standard (2016-10-01 14:37:52)   Pertuzumab injection What is this medicine? PERTUZUMAB (per TOOZ ue mab) is a monoclonal antibody. It is used to treat breast cancer. This medicine may be used for other purposes; ask your health care provider or pharmacist if you have questions. COMMON BRAND NAME(S): PERJETA What should I tell my health care provider before I take this medicine? They need to  know if you have any of these conditions: -heart disease -heart failure -high blood pressure -history of irregular heart beat -recent or ongoing radiation therapy -an unusual or allergic reaction to pertuzumab, other medicines, foods, dyes, or preservatives -pregnant or trying to get pregnant -breast-feeding How should I use this medicine? This medicine is for infusion into a vein. It is given by a health care professional in a hospital or clinic setting. Talk to your pediatrician regarding the use of this medicine in children. Special care may be needed. Overdosage: If you think you have taken too much of this medicine contact a poison control center or emergency room at once. NOTE: This medicine is only for you. Do not share this medicine with others. What if I miss a dose? It is important not to miss your dose. Call your doctor or health care professional if you are unable to keep an appointment. What may interact with this medicine? Interactions are not expected. Give your health care provider a list of all the medicines, herbs, non-prescription drugs, or dietary supplements you use. Also tell them if you smoke, drink alcohol, or use illegal drugs. Some items may interact with your medicine. This list may not describe all possible interactions. Give your health care provider a list of all the medicines, herbs, non-prescription drugs, or dietary supplements you use. Also tell them if you smoke, drink alcohol, or use illegal drugs. Some  items may interact with your medicine. What should I watch for while using this medicine? Your condition will be monitored carefully while you are receiving this medicine. Report any side effects. Continue your course of treatment even though you feel ill unless your doctor tells you to stop. Do not become pregnant while taking this medicine or for 7 months after stopping it. Women should inform their doctor if they wish to become pregnant or think they might  be pregnant. Women of child-bearing potential will need to have a negative pregnancy test before starting this medicine. There is a potential for serious side effects to an unborn child. Talk to your health care professional or pharmacist for more information. Do not breast-feed an infant while taking this medicine or for 7 months after stopping it. Women must use effective birth control with this medicine. Call your doctor or health care professional for advice if you get a fever, chills or sore throat, or other symptoms of a cold or flu. Do not treat yourself. Try to avoid being around people who are sick. You may experience fever, chills, and headache during the infusion. Report any side effects during the infusion to your health care professional. What side effects may I notice from receiving this medicine? Side effects that you should report to your doctor or health care professional as soon as possible: -breathing problems -chest pain or palpitations -dizziness -feeling faint or lightheaded -fever or chills -skin rash, itching or hives -sore throat -swelling of the face, lips, or tongue -swelling of the legs or ankles -unusually weak or tired Side effects that usually do not require medical attention (report to your doctor or health care professional if they continue or are bothersome): -diarrhea -hair loss -nausea, vomiting -tiredness This list may not describe all possible side effects. Call your doctor for medical advice about side effects. You may report side effects to FDA at 1-800-FDA-1088. Where should I keep my medicine? This drug is given in a hospital or clinic and will not be stored at home. NOTE: This sheet is a summary. It may not cover all possible information. If you have questions about this medicine, talk to your doctor, pharmacist, or health care provider.  2018 Elsevier/Gold Standard (2015-11-09 12:08:50)

## 2017-07-11 NOTE — Patient Instructions (Signed)
Cholestyramine powder for oral suspension What is this medicine? CHOLESTYRAMINE (koe LESS tir a meen) is used to lower cholesterol in patients who are at risk of heart disease or stroke. This medicine is only for patients whose cholesterol level is not controlled by diet. This medicine may be used for other purposes; ask your health care provider or pharmacist if you have questions. COMMON BRAND NAME(S): Locholest, Locholest Light, Prevalite, Questran, Questran Light What should I tell my health care provider before I take this medicine? They need to know if you have any of these conditions: -blocked bile duct -an unusual or allergic reaction to cholestyramine, other medicines, foods, dyes, or preservatives -pregnant or trying to get pregnant -breast-feeding How should I use this medicine? Do not take this medicine in the dry form. It must be mixed with a liquid before swallowing. Follow the directions on the prescription label. Place the powder in a glass or cup. Add between 2 and 6 ounces of fluid. This can be water, milk, pulpy fruit juice, fluid soup, or other liquid. Mix well and drink all of the liquid. Take your doses at regular intervals. Do not take your medicine more often than directed. Talk to your pediatrician regarding the use of this medicine in children. Special care may be needed. Overdosage: If you think you have taken too much of this medicine contact a poison control center or emergency room at once. NOTE: This medicine is only for you. Do not share this medicine with others. What if I miss a dose? If you miss a dose, take it as soon as you can. If it is almost time for your next dose, take only that dose. Do not take double or extra doses. What may interact with this medicine? -diuretics -female hormones, like estrogens or progestins and birth control pills -heart medicines such as digoxin or digitoxin -penicillin  G -phenobarbital -phenylbutazone -phytonadione -propranolol -tetracycline antibiotics -thyroid hormones -vitamin A -vitamin D -vitamin E -warfarin Take other drugs at least 1 hour before or 4 hours after this medicine, to avoid decreasing their absorption. This list may not describe all possible interactions. Give your health care provider a list of all the medicines, herbs, non-prescription drugs, or dietary supplements you use. Also tell them if you smoke, drink alcohol, or use illegal drugs. Some items may interact with your medicine. What should I watch for while using this medicine? Visit your doctor or health care professional for regular checks on your progress. Your blood fats and other tests will be measured from time to time. This medicine is only part of a total cholesterol-lowering program. Your health care professional or dietician can suggest a low-cholesterol and low-fat diet that will reduce your risk of getting heart and blood vessel disease. Avoid alcohol and smoking, and keep a proper exercise schedule. To reduce the chance of getting constipated, drink plenty of water and increase the amount of fiber in your diet. Ask your doctor or health care professional for advice if you are constipated. What side effects may I notice from receiving this medicine? Side effects that you should report to your doctor or health care professional as soon as possible: -allergic reactions like skin rash, itching or hives, swelling of the face, lips, or tongue -bloody or black, tarry stools -severe stomach pain with nausea and vomiting -unusual bleeding Side effects that usually do not require medical attention (report to your doctor or health care professional if they continue or are bothersome): -constipation or diarrhea -dizziness -headache -heartburn,   indigestion -nausea, vomiting -perianal irritation This list may not describe all possible side effects. Call your doctor for medical  advice about side effects. You may report side effects to FDA at 1-800-FDA-1088. Where should I keep my medicine? Keep out of the reach of children. Store at room temperature between 15 and 30 degrees C (59 and 86 degrees F). Throw away any unused medicine after the expiration date. NOTE: This sheet is a summary. It may not cover all possible information. If you have questions about this medicine, talk to your doctor, pharmacist, or health care provider.  2018 Elsevier/Gold Standard (2008-01-12 15:33:42)

## 2017-07-11 NOTE — Progress Notes (Signed)
[place]  Telephone:([phone]) [phone] Fax:(336) [phone]     ID: [name] DOB: 18-Dec-1955  MR#: [id number]  JAS#:[id number]  Patient Care Team: Chesley Noon, MD as PCP - General (Family Medicine) Excell Seltzer, MD as Consulting Physician (General Surgery) [name], [name], MD as Consulting Physician (Oncology) Gery Pray, MD as Consulting Physician (Radiation Oncology) Collene Gobble, MD as Consulting Physician (Pulmonary Disease) Scot Dock, NP OTHER MD:  CHIEF COMPLAINT: estrogen receptor positive breast cancer  CURRENT TREATMENT: Adjuvant Trastuzumab and Pertuzumab   BREAST CANCER HISTORY: From the original intake note:  "[name]" had a fall while traveling living to [place] in their RV and injure her left upper arm and  her left breast . Approximately 3 weeks later she noted a mass in the left breast upper inner quadrant. It was somewhat tender. She brought this to medical attention and on 09/27/2016 underwent bilateral diagnostic mammography with tomography [facility] The breast density was category B. The right breast was benign. In the left breast upper inner quadrant there was a large lobulated hyperdense mass which by exam was firm and nonmobile. By ultrasonography in the 11:30 o'clock radius 4 cm from the nipple there was a mass extending to the subcutaneous tissues and measuring 4.9 cm. There was a small superficial fluid collection overlying this consistent with a hematoma. The left axilla was benign.  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  The patient's subsequent history is as detailed below  INTERVAL HISTORY: [name]  is here today for evaluation prior to receiving Trastuzumab and Pertuzumab.  She received one dose a few weeks ago at [facility] and tells me she tolerated it well.  She did have some mild aching afterward that resolved with anti inflammatories.  About two to three days after receiving the treatment she developed swelling in her knees bilaterally.  This also coincided with a 2 day extensive shopping trip.  She went to [place] and saw Dr. Oneita Kras and underwent a right arthrocentesis and received bilateral knee injections.    REVIEW OF SYSTEMS: [name] continues on Letrozole daily and she is tolerating that well.  She does have mild hot flashes at night that she is managing.  She denies any vaginal dryness.  She does have occasional aches and pains.    [name] has an upcoming appointment with Dr. Harl Bowie at [facility].  She says that her incisions are almost completely healed and she anticipates that she will be able to proceed with radiation with Dr. Sondra Come soon after she meets with Dr. Harl Bowie on 07/25/2017.    [name] underwent her last echo at [facility] on 06/18/2017 and her LVEF was 55-60%.  She is planning a trip to [place] to see her daughter who is pregnant with her first child, [name]'s first grandchild.  She is excited to go so she can hear the baby's heart beat for the first time.  She wants to know if she can get a prescription anti-diarrheal prior to leaving just in case she needs it.    A detailed ROS was conducted and is non contributory except what is noted above.   PAST MEDICAL HISTORY: Past Medical History:  Diagnosis Date  . Anxiety   .  CAP (community acquired pneumonia)   . Depression   . Malignant neoplasm of upper-inner quadrant of left female breast ([facility]) 10/01/2016  . Tobacco use     PAST SURGICAL HISTORY: Past Surgical History:  Procedure Laterality Date  . ABDOMINAL HYSTERECTOMY  2011   Laparoscopic  . BREAST LUMPECTOMY WITH SENTINEL LYMPH NODE BIOPSY Left 04/07/2017    . BREAST REDUCTION SURGERY Left 04/07/2017  . CHOLECYSTECTOMY    . open reduction left ankle    . SKIN GRAFT Left 06/19/2017   bil skin grafts to breasts    FAMILY HISTORY Family History  Problem Relation Age of Onset  . Heart disease Mother   . Heart disease Father   . Cancer Sister        ovarian ca  the patient's father died at the age of 63 from a myocardial infarction. The patient's mother died at the age of 4 with congestive heart failure. The patient had one brother, 2 sisters. One of her sisters was diagnosed with ovarian cancer at age 60 and died at age 70 from that disease  GYNECOLOGIC HISTORY:  No LMP recorded. Patient has had a hysterectomy. Menarche age 22, first live birth age 30. The patient is GX P1. She stopped having periods approxiately 2008. She did not take hormone replacement. She used oral contraceptives for approxi 30 years with no complications.  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 Her husband [name] is retired from [employer]. Their daughter [name] lives in [place] and is Mudlogger of the [employer] life in [employer].     ADVANCED DIRECTIVES: not in place   HEALTH MAINTENANCE: Social History  Substance Use Topics  . Smoking status: Former Smoker    Packs/day: 0.50    Years: 20.00    Quit date: 10/21/2016  . Smokeless tobacco: Never Used  . Alcohol use No     Colonoscopy:  PAP:  Bone density:   No Known Allergies  Current Outpatient Prescriptions  Medication Sig Dispense Refill  . acetaminophen (TYLENOL) 325 MG tablet Take 650 mg by mouth every 6 (six) hours as needed for moderate pain.     Marland Kitchen ALPRAZolam (XANAX) 0.25 MG tablet Take 1 po BID    . busPIRone (BUSPAR) 30 MG tablet Take 15 mg by mouth daily.     . diphenhydrAMINE (BENADRYL) 25 MG tablet Take 25 mg by mouth as needed.    Marland Kitchen ibuprofen (ADVIL,MOTRIN) 200 MG tablet Take 800 mg by mouth every 6 (six) hours as needed for  moderate pain.     Marland Kitchen letrozole (FEMARA) 2.5 MG tablet Take by mouth.    . lidocaine-prilocaine (EMLA) cream Apply topically one hour prior to procedure.    . nystatin (MYCOSTATIN) 100000 UNIT/ML suspension Take by mouth.    Marland Kitchen PARoxetine (PAXIL) 40 MG tablet Take 40 mg by mouth every morning.    . ranitidine (ZANTAC) 150 MG tablet Take by mouth.    Marland Kitchen tiZANidine (ZANAFLEX) 4 MG tablet TAKE 1 TABLET BY MOUTH THREE TIMES A DAY     No current facility-administered medications for this visit.     OBJECTIVE:   Vitals:   07/11/17 0929  BP: (!) 154/77  Pulse: 100  Resp: 19  Temp: 98.6 F (37 C)  SpO2: 100%     Body mass index is 29.95 kg/m.    ECOG FS:0 - Asymptomatic  GENERAL: Patient is a well appearing female in no  acute distress HEENT:  Sclerae anicteric.  Oropharynx clear and moist. No ulcerations or evidence of oropharyngeal candidiasis. Neck is supple.  NODES:  No cervical, supraclavicular, or axillary lymphadenopathy palpated.  BREAST EXAM:  Deferred. LUNGS:  Clear to auscultation bilaterally.  No wheezes or rhonchi. HEART:  Regular rate and rhythm. No murmur appreciated. ABDOMEN:  Soft, nontender.  Positive, normoactive bowel sounds. No organomegaly palpated. MSK:  No focal spinal tenderness to palpation. Full range of motion bilaterally in the upper extremities. EXTREMITIES:  No peripheral edema.   SKIN:  Clear with no obvious rashes or skin changes. No nail dyscrasia. NEURO:  Nonfocal. Well oriented.  Appropriate affect.    LAB RESULTS:  CMP     Component Value Date/Time   NA 139 10/09/2016 0838   K 4.3 10/09/2016 0838   CL 101 01/07/2016 0614   CO2 26 10/09/2016 0838   GLUCOSE 132 10/09/2016 0838   BUN 13.2 10/09/2016 0838   CREATININE 0.8 10/09/2016 0838   CALCIUM 9.5 10/09/2016 0838   PROT 7.4 10/09/2016 0838   ALBUMIN 3.8 10/09/2016 0838   AST 23 10/09/2016 0838   ALT 26 10/09/2016 0838   ALKPHOS 148 10/09/2016 0838   BILITOT 0.54 10/09/2016 0838    GFRNONAA >60 01/07/2016 0614   GFRAA >60 01/07/2016 0614    INo results found for: SPEP, UPEP  Lab Results  Component Value Date   WBC 9.2 07/11/2017   NEUTROABS 7.6 (H) 07/11/2017   HGB 13.7 07/11/2017   HCT 41.7 07/11/2017   MCV 84.0 07/11/2017   PLT 274 07/11/2017      Chemistry      Component Value Date/Time   NA 139 10/09/2016 0838   K 4.3 10/09/2016 0838   CL 101 01/07/2016 0614   CO2 26 10/09/2016 0838   BUN 13.2 10/09/2016 0838   CREATININE 0.8 10/09/2016 0838      Component Value Date/Time   CALCIUM 9.5 10/09/2016 0838   ALKPHOS 148 10/09/2016 0838   AST 23 10/09/2016 0838   ALT 26 10/09/2016 0838   BILITOT 0.54 10/09/2016 0838       No results found for: LABCA2  No components found for: LABCA125  No results for input(s): INR in the last 168 hours.  Urinalysis    Component Value Date/Time   COLORURINE ORANGE (A) 12/27/2015 1339   APPEARANCEUR CLOUDY (A) 12/27/2015 1339   LABSPEC 1.030 12/27/2015 1339   PHURINE 5.5 12/27/2015 1339   GLUCOSEU NEGATIVE 12/27/2015 1339   HGBUR NEGATIVE 12/27/2015 1339   BILIRUBINUR SMALL (A) 12/27/2015 1339   KETONESUR 15 (A) 12/27/2015 1339   PROTEINUR 100 (A) 12/27/2015 1339   NITRITE NEGATIVE 12/27/2015 1339   LEUKOCYTESUR TRACE (A) 12/27/2015 1339     STUDIES: Outside records reviewed with the patient  ELIGIBLE FOR AVAILABLE RESEARCH PROTOCOL: no  ASSESSMENT: 61 y.o. [employer], [place] woman status post left breast upper inner quadrant biopsy 09/30/2016 for a clinical T2 N0, stage II a invasive ductal carcinoma, grade 3, estrogen receptor positive, progesterone receptor negative, HER-2 not amplified, with an MIB-1 of 80%.  (1) genetics testing pending  (2) neoadjuvant chemotherapy given between 10/30/2016 and 03/12/2017 consisting of cyclophosphamide and doxorubicin in dose dense fashion 4  followed by paclitaxel 1, then Abraxane (x8?)  further treatments discontinued because of neuropathy.  (3) status  post left lumpectomy with oncoplasty and right reduction mammoplasty showing a residual T2 N1 invasive ductal carcinoma, with lymphovascular invasion, but negative margins; the tumor is now HER-2  positive.  (a) postop PET scan showed no evidence of metastatic disease.  (4) adjuvant trastuzumab/Pertuzumab started 06/20/2017 (received at [facility]) started here on 07/11/2017  (a) Echo on 06/18/2017 at [facility]: LVEF 55-60%  (5) adjuvant radiation to follow  (5) anti-estrogens: Letrozole daily started 03/26/2017  (a) DEXA on 04/03/2017 demonstrated a T score of -1.3 in the right femoral neck  (b) Zometa given at [facility] on 06/20/2017  PLAN:  [name] is doing well today.  I reviewed her cbc with her which is stable.  She is doing well and will proceed with Trastuzumab and Pertuzumab.  She and I reviewed several things.    1. I will send in [name] for her to pick up and bring with her to [place] in case she gets diarrhea unrelieved with Imodium.  I reviewed in detail with her how to take it and she verbalized understanding.  I gave her detailed info in her AVS about this as well.    2. I wrote for her a prescription for a class 1 preventative lymphedema sleeve and glove to wear when flying and the day after.  I informed her that she could get these supplies at either [facility], or [facility].    3. I reviewed with her the echocardiogram results from 06/18/2017.  I reviewed that we have cardio-oncologists who will follow her echocardiograms to help prevent Trastuzumab related toxicity and assist with early intervention if it does occur.  She understands that those appointments will be at [facility] and she is agreeable to a referral to their clinic.    [name] will return in 3 weeks for labs, follow up with Dr. Jana Hakim, and Trastuzumab/Pertuzumab.  She knows to call prior to her next appointment for any questions or concerns whatsoever.    A total of (30) minutes of face-to-face time was spent with this  patient with greater than 50% of that time in counseling and care-coordination.   Scot Dock, NP   07/11/2017 9:40 AM Medical Oncology and Hematology [facility] [address] [place], [place] [address] Tel. [phone]    Fax. [phone]

## 2017-07-11 NOTE — Telephone Encounter (Signed)
Gave patient avs and calender for upcoming appointment. Per 9/21 los

## 2017-07-14 ENCOUNTER — Telehealth (HOSPITAL_COMMUNITY): Payer: Self-pay | Admitting: Vascular Surgery

## 2017-07-14 NOTE — Telephone Encounter (Signed)
Left pt message to make New brst cancer appt in NOV

## 2017-07-25 ENCOUNTER — Other Ambulatory Visit: Payer: Self-pay | Admitting: *Deleted

## 2017-07-28 ENCOUNTER — Telehealth: Payer: Self-pay | Admitting: *Deleted

## 2017-07-28 NOTE — Telephone Encounter (Signed)
This RN returned call to pt per her VM stating concern with request to be seen due to " I found a new knot in my breast"  Per call- Zissel states onset of knot in area of prior surgery-  Knot is not painful Without redness No swelling or discharge.  Pt was just seen Friday by her plastic surgeon " who said everything was fine and released me for radiation "  This RN discussed with pt above likely not cancer due to more sudden onset but likely a hematoma under the surgical area.  " Oh yes - Dr Sondra Come thought he felt something like that when I saw him before - I feel much better and will follow up with Dr Sondra Come on the 15th "  Per call pt understands to call if she has additional concerns or other changes occur.  No further needs at this time.

## 2017-07-31 ENCOUNTER — Other Ambulatory Visit: Payer: Self-pay

## 2017-07-31 DIAGNOSIS — C50212 Malignant neoplasm of upper-inner quadrant of left female breast: Secondary | ICD-10-CM

## 2017-07-31 DIAGNOSIS — Z170 Estrogen receptor positive status [ER+]: Principal | ICD-10-CM

## 2017-07-31 NOTE — Progress Notes (Signed)
Location of Breast Cancer: Left Breast UIQ Poorly Differentiated Invasive Ductal Carcinoma  Histology per Pathology Report:   04/07/17 1. Breast, left, lumpectomy:  - Metaplastic carcinoma, poorly differentiated (tubular differentiation score 3/3, nuclear pleomorphism score 3/3, mitotic rate score 3/3; total Nottingham score 9/9), 45 x 34 x 32 mm. - Carcinoma shows metaplastic squamous differentiation. - Ductal carcinoma in-situ, high grade, comedo type with comedo necrosis. - Margins are negative for carcinoma, closest are posterior and superior at 0.5 mm. - Lymphovascular invasion is present. - Biopsy site changes. - TNM stage pT2 ([id number] M-not applicable; AJCC stage IIB. - Please see synoptic report below.  2. Sentinel lymph node, left axilla, excision:  - Two of three lymph nodes, positive for metastatic carcinoma (2/3). - One macrometastasis (2.5 mmm) and one micrometastasis (1 mm). - No extranodal extension present.  3. Breast, left, oncoplastic breast reduction:  - Benign skin and breast parenchyma, 77.7 gm.  4. Breast, right, reduction mammoplasty:  - Benign skin and breast parenchyma, 152.6 gm.  5. Breast, left inferior/anterior margin, excision:  - Benign breast parenchyma and one benign intramammary lymph node (0/1).  09/30/16 Diagnosis Breast, left, needle core biopsy, 11:30 o'clock - INVASIVE DUCTAL CARCINOMA. - HIGH GRADE DUCTAL CARCINOMA IN SITU. - SEE COMMENT.  Receptor Status: ER(20%, PR (0%), Her2-neu (amplified), Ki-()  Did patient present with symptoms (if so, please note symptoms) or was this found on screening mammography?: Patient had a fall while traveling living to [place] in their RV and injure her left upper arm and  her left breast . Approximately 3 weeks later she noted a mass in the left breast upper inner quadrant.   Past/Anticipated interventions by surgeon, if any: 04/07/17 - LEFT ONCOPLASTIC BREAST REDUCTION AND RIGHT BREAST REDUCTION  (Bilateral) and LEFT BREAST ONCOPLASTIC LUMPECTOMY WITH LEFT SENTINEL NODE BIOPSY (Left)  Past/Anticipated interventions by medical oncology, if any: neoadjuvant chemotherapy given between 10/30/2016 and 03/12/2017 consisting of cyclophosphamide and doxorubicin in dose dense fashion 4  followed by paclitaxel 1, then Abraxane (x8?)  further treatments discontinued because of neuropathy. Received first Perjeta and herceptin on Friday.  Lymphedema issues, if any:  no   Pain issues, if any:  no   SAFETY ISSUES:  Prior radiation? Had mark and start at [facility]  Pacemaker/ICD? no  Possible current pregnancy?no  Is the patient on methotrexate? no  Current Complaints / other details:  Cleared for radiation on 07/25/17 by Dr. Harl Bowie.  BP (!) 163/93 (BP Location: Right Arm, Patient Position: Sitting)   Pulse 98   Temp 98.2 F (36.8 C) (Oral)   Ht 5' 9"  (1.753 m)   Wt 202 lb 9.6 oz (91.9 kg)   SpO2 100%   BMI 29.92 kg/m    Wt Readings from Last 3 Encounters:  08/04/17 202 lb 9.6 oz (91.9 kg)  08/01/17 203 lb 14.4 oz (92.5 kg)  07/11/17 202 lb 12.8 oz (92 kg)

## 2017-08-01 ENCOUNTER — Telehealth: Payer: Self-pay | Admitting: Oncology

## 2017-08-01 ENCOUNTER — Ambulatory Visit (HOSPITAL_BASED_OUTPATIENT_CLINIC_OR_DEPARTMENT_OTHER): Payer: BLUE CROSS/BLUE SHIELD

## 2017-08-01 ENCOUNTER — Ambulatory Visit (HOSPITAL_BASED_OUTPATIENT_CLINIC_OR_DEPARTMENT_OTHER): Payer: BLUE CROSS/BLUE SHIELD | Admitting: Oncology

## 2017-08-01 ENCOUNTER — Other Ambulatory Visit: Payer: Self-pay | Admitting: Oncology

## 2017-08-01 ENCOUNTER — Other Ambulatory Visit (HOSPITAL_BASED_OUTPATIENT_CLINIC_OR_DEPARTMENT_OTHER): Payer: BLUE CROSS/BLUE SHIELD

## 2017-08-01 VITALS — BP 129/73 | HR 73 | Temp 98.0°F | Resp 18

## 2017-08-01 VITALS — BP 148/70 | HR 94 | Temp 98.4°F | Resp 19 | Ht 69.0 in | Wt 203.9 lb

## 2017-08-01 DIAGNOSIS — C50212 Malignant neoplasm of upper-inner quadrant of left female breast: Secondary | ICD-10-CM

## 2017-08-01 DIAGNOSIS — Z5112 Encounter for antineoplastic immunotherapy: Secondary | ICD-10-CM

## 2017-08-01 DIAGNOSIS — Z170 Estrogen receptor positive status [ER+]: Principal | ICD-10-CM

## 2017-08-01 DIAGNOSIS — N63 Unspecified lump in unspecified breast: Secondary | ICD-10-CM

## 2017-08-01 LAB — COMPREHENSIVE METABOLIC PANEL
ALT: 14 U/L (ref 0–55)
ANION GAP: 11 meq/L (ref 3–11)
AST: 15 U/L (ref 5–34)
Albumin: 4.3 g/dL (ref 3.5–5.0)
Alkaline Phosphatase: 127 U/L (ref 40–150)
BUN: 15.9 mg/dL (ref 7.0–26.0)
CHLORIDE: 105 meq/L (ref 98–109)
CO2: 25 meq/L (ref 22–29)
CREATININE: 0.8 mg/dL (ref 0.6–1.1)
Calcium: 9.7 mg/dL (ref 8.4–10.4)
EGFR: >60 mL/min/{1.73_m2} (ref >60)
Glucose: 102 mg/dl (ref 70–140)
POTASSIUM: 4.2 meq/L (ref 3.5–5.1)
Sodium: 142 mEq/L (ref 136–145)
Total Bilirubin: 0.53 mg/dL (ref 0.20–1.20)
Total Protein: 7.2 g/dL (ref 6.4–8.3)

## 2017-08-01 LAB — CBC WITH DIFFERENTIAL/PLATELET
BASO%: 1 % (ref 0.0–2.0)
Basophils Absolute: 0.1 10*3/uL (ref 0.0–0.1)
EOS%: 2.5 % (ref 0.0–7.0)
Eosinophils Absolute: 0.2 10*3/uL (ref 0.0–0.5)
HCT: 40.7 % (ref 34.8–46.6)
HGB: 13.3 g/dL (ref 11.6–15.9)
LYMPH%: 16.5 % (ref 14.0–49.7)
MCH: 27.5 pg (ref 25.1–34.0)
MCHC: 32.7 g/dL (ref 31.5–36.0)
MCV: 83.9 fL (ref 79.5–101.0)
MONO#: 0.5 10*3/uL (ref 0.1–0.9)
MONO%: 6.2 % (ref 0.0–14.0)
NEUT#: 6 10*3/uL (ref 1.5–6.5)
NEUT%: 73.8 % (ref 38.4–76.8)
Platelets: 273 10*3/uL (ref 145–400)
RBC: 4.85 10*6/uL (ref 3.70–5.45)
RDW: 16.5 % — ABNORMAL HIGH (ref 11.2–14.5)
WBC: 8.1 10*3/uL (ref 3.9–10.3)
lymph#: 1.3 10*3/uL (ref 0.9–3.3)

## 2017-08-01 MED ORDER — HEPARIN SOD (PORK) LOCK FLUSH 100 UNIT/ML IV SOLN
500.0000 [IU] | Freq: Once | INTRAVENOUS | Status: AC | PRN
  Administered 2017-08-01: 500 [IU]
  Filled 2017-08-01: qty 5

## 2017-08-01 MED ORDER — ACETAMINOPHEN 325 MG PO TABS
ORAL_TABLET | ORAL | Status: AC
  Filled 2017-08-01: qty 2

## 2017-08-01 MED ORDER — DIPHENHYDRAMINE HCL 25 MG PO CAPS
25.0000 mg | ORAL_CAPSULE | Freq: Once | ORAL | Status: AC
  Administered 2017-08-01: 25 mg via ORAL

## 2017-08-01 MED ORDER — SODIUM CHLORIDE 0.9% FLUSH
10.0000 mL | INTRAVENOUS | Status: DC | PRN
  Administered 2017-08-01: 10 mL
  Filled 2017-08-01: qty 10

## 2017-08-01 MED ORDER — PERTUZUMAB CHEMO INJECTION 420 MG/14ML
420.0000 mg | Freq: Once | INTRAVENOUS | Status: AC
  Administered 2017-08-01: 420 mg via INTRAVENOUS
  Filled 2017-08-01: qty 14

## 2017-08-01 MED ORDER — TRASTUZUMAB CHEMO 150 MG IV SOLR
6.0000 mg/kg | Freq: Once | INTRAVENOUS | Status: AC
  Administered 2017-08-01: 567 mg via INTRAVENOUS
  Filled 2017-08-01: qty 27

## 2017-08-01 MED ORDER — SODIUM CHLORIDE 0.9 % IV SOLN
Freq: Once | INTRAVENOUS | Status: AC
  Administered 2017-08-01: 16:00:00 via INTRAVENOUS

## 2017-08-01 MED ORDER — DIPHENHYDRAMINE HCL 25 MG PO CAPS
ORAL_CAPSULE | ORAL | Status: AC
  Filled 2017-08-01: qty 1

## 2017-08-01 MED ORDER — ACETAMINOPHEN 325 MG PO TABS
650.0000 mg | ORAL_TABLET | Freq: Once | ORAL | Status: AC
  Administered 2017-08-01: 650 mg via ORAL

## 2017-08-01 NOTE — Progress Notes (Addendum)
[place]  [phone] [phone]     ID: [name] DOB: Jun 03, 1956  MR#: [id number]  KJZ#:[id number]  Patient Care Team: Chesley Noon, MD as PCP - General (Family Medicine) Excell Seltzer, MD as Consulting Physician (General Surgery) Aashir Umholtz, [name], MD as Consulting Physician (Oncology) Gery Pray, MD as Consulting Physician (Radiation Oncology) Collene Gobble, MD as Consulting Physician (Pulmonary Disease) OTHER MD:  CHIEF COMPLAINT: estrogen receptor positive breast cancer  CURRENT TREATMENT: Adjuvant Trastuzumab/ Pertuzumab   BREAST CANCER HISTORY: From the original intake note:  "[name]" had a fall while traveling living to [place] in their RV and injure her left upper arm and  her left breast . Approximately 3 weeks later she noted a mass in the left breast upper inner quadrant. It was somewhat tender. She brought this to medical attention and on 09/27/2016 underwent bilateral diagnostic mammography with tomography [facility] The breast density was category B. The right breast was benign. In the left breast upper inner quadrant there was a large lobulated hyperdense mass which by exam was firm and nonmobile. By ultrasonography in the 11:30 o'clock radius 4 cm from the nipple there was a mass extending to the subcutaneous tissues and measuring 4.9 cm. There was a small superficial fluid collection overlying this consistent with a hematoma. The left axilla was benign.  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  The patient's subsequent history is as detailed below  INTERVAL HISTORY: [name] returns today for  follow-up of her triple positive breast cancer accompanied by her husband. She continues on letrozole, with good tolerance. She is currently receiving trastuzumab and Pertuzumab with a dose due today. She is tolerating the treatment well. She has some loose bowel movements occasionally but no frank diarrhea and she recently took a trip to [place] where she took Imodium and Lomotil with her but did not need to use it.  Her most recent echocardiogram was in August and she is already scheduled to meet with Dr. Benjamine Mola for a repeat echo November 12  She has not yet started her radiation treatments, but does have an appointment with Dr. Sondra Come coming up. She is concerned because they would like to travel around [place] time and is not sure if that is going to be possible.  REVIEW OF SYSTEMS: [name] is doing well. She will be starting radiation in the near future and has a follow up appointment with Dr. Sondra Come on 08/04/2017. Once she starts radiation she is hoping she will still be able to go to the gym to go swimming. She has also been riding her bicycle. Pt really enjoys to go shopping as well. Pt notes only a few loose stools lately. She denies unusual headaches, visual changes, nausea, vomiting, or dizziness. There has been no unusual cough, phlegm production, or pleurisy. This been no change in bowel or bladder habits. She denies unexplained fatigue or unexplained weight loss, bleeding, rash, or fever. A detailed review of systems was otherwise entirely negative.    PAST MEDICAL HISTORY: Past Medical History:  Diagnosis Date  . Anxiety   . CAP (community acquired pneumonia)   . Depression   . Malignant neoplasm of upper-inner quadrant of left female breast ([place])  10/01/2016  . Tobacco use     PAST SURGICAL HISTORY: Past Surgical History:  Procedure Laterality Date  . ABDOMINAL HYSTERECTOMY  2011   Laparoscopic  . BREAST LUMPECTOMY WITH SENTINEL LYMPH NODE BIOPSY Left 04/07/2017  . BREAST  REDUCTION SURGERY Left 04/07/2017  . CHOLECYSTECTOMY    . open reduction left ankle    . SKIN GRAFT Left 06/19/2017   bil skin grafts to breasts    FAMILY HISTORY Family History  Problem Relation Age of Onset  . Heart disease Mother   . Heart disease Father   . Cancer Sister        ovarian ca  the patient's father died at the age of 21 from a myocardial infarction. The patient's mother died at the age of 6 with congestive heart failure. The patient had one brother, 2 sisters. One of her sisters was diagnosed with ovarian cancer at age 22 and died at age 15 from that disease  GYNECOLOGIC HISTORY:  No LMP recorded. Patient has had a hysterectomy. Menarche age 40, first live birth age 48. The patient is GX P1. She stopped having periods approxiately 2008. She did not take hormone replacement. She used oral contraceptives for approxi 30 years with no complications.  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 Her husband [name] is retired from [employer]. Their daughter [name] lives in [place] and is Mudlogger of the [employer] life in [employer].     ADVANCED DIRECTIVES: not in place   HEALTH MAINTENANCE: Social History  Substance Use Topics  . Smoking status: Former Smoker    Packs/day: 0.50    Years: 20.00    Quit date: 10/21/2016  . Smokeless tobacco: Never Used  . Alcohol use No     Colonoscopy:  PAP:  Bone density:   No Known Allergies  Current Outpatient Prescriptions  Medication Sig Dispense Refill  . acetaminophen (TYLENOL) 325 MG tablet Take 650 mg by mouth every 6 (six) hours as needed for moderate pain.     Marland Kitchen ALPRAZolam (XANAX) 0.25 MG tablet Take 1 po BID    . busPIRone (BUSPAR) 30 MG tablet Take 15 mg by mouth daily.     . cholestyramine (QUESTRAN) 4 g packet Take 1 packet (4 g total) by mouth 3 (three) times daily with meals. 60 each 12  . diphenhydrAMINE (BENADRYL) 25 MG tablet Take 25 mg by mouth as  needed.    Marland Kitchen ibuprofen (ADVIL,MOTRIN) 200 MG tablet Take 800 mg by mouth every 6 (six) hours as needed for moderate pain.     Marland Kitchen letrozole (FEMARA) 2.5 MG tablet Take by mouth.    . lidocaine-prilocaine (EMLA) cream Apply topically one hour prior to procedure.    . nystatin (MYCOSTATIN) 100000 UNIT/ML suspension Take by mouth.    Marland Kitchen PARoxetine (PAXIL) 40 MG tablet Take 40 mg by mouth every morning.    . ranitidine (ZANTAC) 150 MG tablet Take by mouth.    Marland Kitchen tiZANidine (ZANAFLEX) 4 MG tablet TAKE 1 TABLET BY MOUTH THREE TIMES A DAY     No current facility-administered medications for this visit.     OBJECTIVE: Middle-aged white woman who appears stated age  35:   08/01/17 1509  BP: (!) 148/70  Pulse: 94  Resp: 19  Temp: 98.4 F (36.9 C)  SpO2: 98%     Body mass index is 30.11 kg/m.    ECOG FS:0 - Asymptomatic  Sclerae  unicteric, pupils round and equal Oropharynx clear and moist No cervical or supraclavicular adenopathy Lungs no rales or rhonchi Heart regular rate and rhythm Abd soft, nontender, positive bowel sounds MSK no focal spinal tenderness, no upper extremity lymphedema Neuro: nonfocal, well oriented, appropriate affect Breasts: The right breast is status post reduction mammoplasty. The left breast is status post lumpectomy. There is no evidence of local recurrence. Both axillae are benign.  LAB RESULTS:  CMP     Component Value Date/Time   NA 139 07/11/2017 0905   K 5.3 (H) 07/11/2017 0905   CL 101 01/07/2016 0614   CO2 24 07/11/2017 0905   GLUCOSE 127 07/11/2017 0905   BUN 13.5 07/11/2017 0905   CREATININE 0.8 07/11/2017 0905   CALCIUM 10.7 (H) 07/11/2017 0905   PROT 7.1 07/11/2017 0905   ALBUMIN 4.0 07/11/2017 0905   AST 16 07/11/2017 0905   ALT 19 07/11/2017 0905   ALKPHOS 143 07/11/2017 0905   BILITOT 0.34 07/11/2017 0905   GFRNONAA >60 01/07/2016 0614   GFRAA >60 01/07/2016 0614    INo results found for: SPEP, UPEP  Lab Results  Component  Value Date   WBC 8.1 08/01/2017   NEUTROABS 6.0 08/01/2017   HGB 13.3 08/01/2017   HCT 40.7 08/01/2017   MCV 83.9 08/01/2017   PLT 273 08/01/2017      Chemistry      Component Value Date/Time   NA 139 07/11/2017 0905   K 5.3 (H) 07/11/2017 0905   CL 101 01/07/2016 0614   CO2 24 07/11/2017 0905   BUN 13.5 07/11/2017 0905   CREATININE 0.8 07/11/2017 0905      Component Value Date/Time   CALCIUM 10.7 (H) 07/11/2017 0905   ALKPHOS 143 07/11/2017 0905   AST 16 07/11/2017 0905   ALT 19 07/11/2017 0905   BILITOT 0.34 07/11/2017 0905       No results found for: LABCA2  No components found for: [id number]  No results for input(s): INR in the last 168 hours.  Urinalysis    Component Value Date/Time   COLORURINE ORANGE (A) 12/27/2015 1339   APPEARANCEUR CLOUDY (A) 12/27/2015 1339   LABSPEC 1.030 12/27/2015 1339   PHURINE 5.5 12/27/2015 1339   GLUCOSEU NEGATIVE 12/27/2015 1339   HGBUR NEGATIVE 12/27/2015 1339   BILIRUBINUR SMALL (A) 12/27/2015 1339   KETONESUR 15 (A) 12/27/2015 1339   PROTEINUR 100 (A) 12/27/2015 1339   NITRITE NEGATIVE 12/27/2015 1339   LEUKOCYTESUR TRACE (A) 12/27/2015 1339     STUDIES: August echo results reviewed with the patient  ELIGIBLE FOR AVAILABLE RESEARCH PROTOCOL: no  ASSESSMENT: 61 y.o. [employer], [place] woman status post left breast upper inner quadrant biopsy 09/30/2016 for a clinical T2 N0, stage II a invasive ductal carcinoma, grade 3, estrogen receptor positive, progesterone receptor negative, HER-2 not amplified, with an MIB-1 of 80%.  (1) genetics testing pending  (2) neoadjuvant chemotherapy given between 10/30/2016 and 03/12/2017 consisting of cyclophosphamide and doxorubicin in dose dense fashion 4  followed by paclitaxel 1, then Abraxane (x8?)  further treatments discontinued because of neuropathy.  (3) status post left lumpectomy with oncoplasty and right reduction mammoplasty showing a residual T2 N1 invasive ductal  carcinoma, with lymphovascular invasion, but negative margins; the tumor is now HER-2 positive.  (a) postop PET scan showed no evidence of metastatic disease.  (4) adjuvant trastuzumab/Pertuzumab started 06/20/2017 (received at [facility]) started here on 07/11/2017  (a) Echo on 06/18/2017 at [facility]: LVEF 55-60%  (5) adjuvant radiation to  follow  (5) anti-estrogens: Letrozole daily started 03/26/2017  (a) DEXA on 04/03/2017 demonstrated a T score of -1.3 in the right femoral neck  (b) Zometa given at [facility] on 06/20/2017  PLAN:  [name] is tolerating the anti-HER-2 immunotherapy well, and the plan will be to continue that for a total of a year. We did discuss the recent studies suggesting that 6 months in many cases may be adequate. While that data is important to consider, this study has not been replicated, the rectus of breast cancer treatment in [place] where the study originated is somewhat different from the [place], and in short in node-positive cases I am continuing immunotherapy for one year until further data accrues. [name] is agreeable to this plan  She is thinking of traveling around [place] time. She would be due for a treatment November 23. I am moving that back to November 30. However the radiation treatments to not be so easily managed. Be discussing that with Dr. Sondra Come at their upcoming visit.  We discussed the role of cardio oncology in her care and she has an appointment with Dr. Benjamine Mola for mid November with her next echocardiogram  She will see me after that. She knows to call for any problems that may develop before the next visit here.  [name], [name], MD  08/01/17 3:30 PM Medical Oncology and Hematology [facility] [address] [place], [place] [address] Tel. [phone]    Fax. [phone]  This document serves as a record of services personally performed by Chauncey Cruel, MD. It was created on her behalf by Margit Banda, a trained  medical scribe. The creation of this record is based on the scribe's personal observations and the provider's statements to them. This document has been checked and approved by the attending provider.   Addendum: 08/06/2017 I was called today by radiology stating that ultrasound findings 3 separate masses in the left breast, 2 of them adjacent to the seroma, crossing over to different quadrants, and there is suspicious for disease recurrence. I am putting in orders for biopsies. This has been scheduled for tomorrow.

## 2017-08-01 NOTE — Patient Instructions (Signed)
[facility] Discharge Instructions for Patients Receiving Chemotherapy  Today you received the following chemotherapy agents Herceptin and Perjeta.   To help prevent nausea and vomiting after your treatment, we encourage you to take your nausea medication as directed.   If you develop nausea and vomiting that is not controlled by your nausea medication, call the clinic.   BELOW ARE SYMPTOMS THAT SHOULD BE REPORTED IMMEDIATELY:  *FEVER GREATER THAN 100.5 F  *CHILLS WITH OR WITHOUT FEVER  NAUSEA AND VOMITING THAT IS NOT CONTROLLED WITH YOUR NAUSEA MEDICATION  *UNUSUAL SHORTNESS OF BREATH  *UNUSUAL BRUISING OR BLEEDING  TENDERNESS IN MOUTH AND THROAT WITH OR WITHOUT PRESENCE OF ULCERS  *URINARY PROBLEMS  *BOWEL PROBLEMS  UNUSUAL RASH Items with * indicate a potential emergency and should be followed up as soon as possible.  Feel free to call the clinic should you have any questions or concerns. The clinic phone number is [phone].  Please show the CHEMO ALERT CARD at check-in to the Emergency Department and triage nurse.   

## 2017-08-01 NOTE — Progress Notes (Signed)
Per Wilber Bihari NP 07/11/17 note pt had Echo (55-60%) on 06/18/17 at [facility].

## 2017-08-01 NOTE — Progress Notes (Signed)
Pt refused to stay for 30 min post perjeta observation. VSS at D/C.

## 2017-08-01 NOTE — Telephone Encounter (Signed)
Gave patient AVS. Patient will receive calendar of upcoming appointments in treatment area.

## 2017-08-04 ENCOUNTER — Encounter: Payer: Self-pay | Admitting: Radiation Oncology

## 2017-08-04 ENCOUNTER — Ambulatory Visit
Admission: RE | Admit: 2017-08-04 | Discharge: 2017-08-04 | Disposition: A | Discharge status: Home / Self Care | Payer: BLUE CROSS/BLUE SHIELD | Source: Ambulatory Visit | Attending: Radiation Oncology | Admitting: Radiation Oncology

## 2017-08-04 DIAGNOSIS — Z170 Estrogen receptor positive status [ER+]: Principal | ICD-10-CM

## 2017-08-04 DIAGNOSIS — C50212 Malignant neoplasm of upper-inner quadrant of left female breast: Secondary | ICD-10-CM

## 2017-08-04 NOTE — Progress Notes (Signed)
Radiation Oncology         [phone] ________________________________  Name: [name] MRN: [id number]  Date: 08/04/2017  DOB: Jan 21, 1956  Follow-Up Visit Note  CC: Chesley Noon, MD  [name], [name], MD    ICD-10-CM   1. Malignant neoplasm of upper-inner quadrant of left breast in female, estrogen receptor positive ([place]) C50.212    Z17.0     Diagnosis: Clinical Stage T3, Nx, LeftBreast UIQ Poorly DifferentiatedInvasive Ductal Carcinoma, ER+/ PR-/ Her2-, Grade III, status post left lumpectomy with oncoplastyand right reduction mammoplasty showing a residual T2 N1 invasive ductal carcinoma, with lymphovascular invasion, but negative margins; the tumor is nowHER-2 positive.  Narrative:  The patient returns today for follow-up. Please see original consultation note for presenting HPI. She reports that she has been recovering from her mammoplasty surgery very well. She states that the solution to place on her incision was burning her skin initially and she she switched to [place] which improved her incisional site very well and without pain. Her incision has otherwise healed well and without infection or discharge. She denies any residual pain to the left breast, but she notes that small lump has appeared near the site of her surgical incision. Dr Jana Hakim evaluated this and thought this to be a seroma, however, she will undergo ultrasound in the near future to assess this. She has occasional pains along the left arm and tenderness over her incisional site, but she denies any lymphedema or numbness involving the breast or left arm.                 ALLERGIES:  has No Known Allergies.  Meds: Current Outpatient Prescriptions  Medication Sig Dispense Refill  . ALPRAZolam (XANAX) 0.25 MG tablet Take 1 po BID    . busPIRone (BUSPAR) 30 MG tablet Take 15 mg by mouth daily.     . diphenhydrAMINE (BENADRYL) 25 MG tablet Take 25 mg by mouth as needed.    Marland Kitchen ibuprofen  (ADVIL,MOTRIN) 200 MG tablet Take 800 mg by mouth every 6 (six) hours as needed for moderate pain.     Marland Kitchen letrozole (FEMARA) 2.5 MG tablet Take by mouth.    . lidocaine-prilocaine (EMLA) cream Apply topically one hour prior to procedure.    . nystatin (MYCOSTATIN) 100000 UNIT/ML suspension Take by mouth.    Marland Kitchen PARoxetine (PAXIL) 40 MG tablet Take 40 mg by mouth every morning.    . ranitidine (ZANTAC) 150 MG tablet Take by mouth.    Marland Kitchen acetaminophen (TYLENOL) 325 MG tablet Take 650 mg by mouth every 6 (six) hours as needed for moderate pain.     . cholestyramine (QUESTRAN) 4 g packet Take 1 packet (4 g total) by mouth 3 (three) times daily with meals. (Patient not taking: Reported on 08/04/2017) 60 each 12  . tiZANidine (ZANAFLEX) 4 MG tablet TAKE 1 TABLET BY MOUTH THREE TIMES A DAY     No current facility-administered medications for this encounter.     Physical Findings: The patient is in no acute distress. Patient is alert and oriented.  height is 5' 9"  (1.753 m) and weight is 202 lb 9.6 oz (91.9 kg). Her oral temperature is 98.2 F (36.8 C). Her blood pressure is 163/93 (abnormal) and her pulse is 98. Her oxygen saturation is 100%. .  No significant changes. Lungs are clear to auscultation bilaterally. Heart has regular rate and rhythm. No palpable cervical, supraclavicular, or axillary adenopathy. Abdomen soft, non-tender, normal bowel sounds. Right breast  pt has scars along the inferior aspect of the breast which have healed well without signs of drainage or infection. Scar continue to have a reddish tent to them, but no infection. Left breast reveals scars to be completely healed along the inferior breast region. These scars are also have a reddish tent to them, but no signs of infection noted.   Lab Findings: Lab Results  Component Value Date   WBC 8.1 08/01/2017   HGB 13.3 08/01/2017   HCT 40.7 08/01/2017   MCV 83.9 08/01/2017   PLT 273 08/01/2017    Radiographic Findings: No  results found.   Impression: The patient returns today for follow up to discuss ongoing management of her breast cancer. Previously, she was found to have clinical stage T3, Nx, LeftBreast UIQ Poorly DifferentiatedInvasive Ductal Carcinoma, ER+/ PR-/ Her2-, Grade III, status post left lumpectomy with oncoplastyand right reduction mammoplasty showing a residual T2 N1 invasive ductal carcinoma, with lymphovascular invasion, but negative margins; the tumor is nowHER-2 positive.  She returns for further evaluation. The pt is received clearance from her plastic surgeon to proceed with radiation treatment. Seen Dr Jana Hakim, pt had recently noticed some swelling the breast and the pt will be scheduled for an US of the left breast on Wednesday, Oct. 17th.   . She will return for simulation on Oct. 18th at 10am unless her US shows further problem warranting evaluation.   Plan:  Simulation planning to take place on Oct 18th at 8am. She will have [place] the day prior to evaluate concern in the left breast. If this is clear we will proceed with treatments one week from simulation.   ____________________________________ -----------------------------------  [name] Promise, PhD, MD  This document serves as a record of services personally performed by Gery Pray, MD. It was created on his behalf by Reola Mosher, a trained medical scribe. The creation of this record is based on the scribe's personal observations and the provider's statements to them. This document has been checked and approved by the attending provider.

## 2017-08-06 ENCOUNTER — Ambulatory Visit
Admission: RE | Admit: 2017-08-06 | Discharge: 2017-08-06 | Disposition: A | Discharge status: Home / Self Care | Payer: BLUE CROSS/BLUE SHIELD | Source: Ambulatory Visit | Attending: Oncology | Admitting: Oncology

## 2017-08-06 ENCOUNTER — Other Ambulatory Visit: Payer: Self-pay | Admitting: Oncology

## 2017-08-06 DIAGNOSIS — N63 Unspecified lump in unspecified breast: Secondary | ICD-10-CM

## 2017-08-06 DIAGNOSIS — Z170 Estrogen receptor positive status [ER+]: Principal | ICD-10-CM

## 2017-08-06 DIAGNOSIS — C50212 Malignant neoplasm of upper-inner quadrant of left female breast: Secondary | ICD-10-CM

## 2017-08-06 HISTORY — DX: Personal history of antineoplastic chemotherapy: Z92.21

## 2017-08-06 NOTE — Addendum Note (Signed)
Addended by: Chauncey Cruel on: 08/06/2017 02:39 PM   Modules accepted: Orders

## 2017-08-07 ENCOUNTER — Ambulatory Visit
Admission: RE | Admit: 2017-08-07 | Discharge: 2017-08-07 | Disposition: A | Discharge status: Home / Self Care | Payer: BLUE CROSS/BLUE SHIELD | Source: Ambulatory Visit | Attending: Oncology | Admitting: Oncology

## 2017-08-07 ENCOUNTER — Ambulatory Visit
Admission: RE | Admit: 2017-08-07 | Payer: BLUE CROSS/BLUE SHIELD | Source: Ambulatory Visit | Admitting: Radiation Oncology

## 2017-08-07 DIAGNOSIS — N63 Unspecified lump in unspecified breast: Secondary | ICD-10-CM

## 2017-08-12 ENCOUNTER — Other Ambulatory Visit: Payer: Self-pay | Admitting: Oncology

## 2017-08-12 DIAGNOSIS — C50212 Malignant neoplasm of upper-inner quadrant of left female breast: Secondary | ICD-10-CM

## 2017-08-12 DIAGNOSIS — Z170 Estrogen receptor positive status [ER+]: Principal | ICD-10-CM

## 2017-08-12 DIAGNOSIS — C50912 Malignant neoplasm of unspecified site of left female breast: Secondary | ICD-10-CM

## 2017-08-12 NOTE — Progress Notes (Unsigned)
[name] has locally recurrent disease, involving the left breast. She had a PET CT scan 05/30/2017 to [facility] which was negative. I think it would be useful to repeat a CT scan of the chest, but she will need additional surgery.  Note that this tumor is grade 3 and HER-2 negative. We are waiting on estrogen and progesterone receptor markers.

## 2017-08-13 ENCOUNTER — Ambulatory Visit (HOSPITAL_BASED_OUTPATIENT_CLINIC_OR_DEPARTMENT_OTHER): Payer: BLUE CROSS/BLUE SHIELD | Admitting: Adult Health

## 2017-08-13 ENCOUNTER — Encounter: Payer: Self-pay | Admitting: Adult Health

## 2017-08-13 VITALS — BP 156/85 | HR 81 | Temp 98.4°F | Resp 17 | Ht 69.0 in | Wt 205.7 lb

## 2017-08-13 DIAGNOSIS — C50212 Malignant neoplasm of upper-inner quadrant of left female breast: Secondary | ICD-10-CM

## 2017-08-13 DIAGNOSIS — Z170 Estrogen receptor positive status [ER+]: Secondary | ICD-10-CM

## 2017-08-13 MED ORDER — ALPRAZOLAM ER 1 MG PO TB24: 1.0000 mg | ORAL_TABLET | Freq: Every day | ORAL | 0 refills | Status: DC

## 2017-08-13 NOTE — Progress Notes (Addendum)
[name]  [phone] [phone]     ID: [name] DOB: Mar 14, 1956  MR#: [id number]  MBT#:[id number]  Patient Care Team: Chesley Noon, MD as PCP - General (Family Medicine) Excell Seltzer, MD as Consulting Physician (General Surgery) [name], [name], MD as Consulting Physician (Oncology) Gery Pray, MD as Consulting Physician (Radiation Oncology) Collene Gobble, MD as Consulting Physician (Pulmonary Disease) Verdell Carmine, MD as Referring Physician (Internal Medicine) Larey Dresser, MD as Consulting Physician (Cardiology) OTHER MD:  CHIEF COMPLAINT: estrogen receptor positive breast cancer  CURRENT TREATMENT: Adjuvant Trastuzumab/ Pertuzumab   BREAST CANCER HISTORY: From the original intake note:  "[name]" had a fall while traveling living to [place] in their RV and injure her left upper arm and  her left breast . Approximately 3 weeks later she noted a mass in the left breast upper inner quadrant. It was somewhat tender. She brought this to medical attention and on 09/27/2016 underwent bilateral diagnostic mammography with tomography [facility] The breast density was category B. The right breast was benign. In the left breast upper inner quadrant there was a large lobulated hyperdense mass which by exam was firm and nonmobile. By ultrasonography in the 11:30 o'clock radius 4 cm from the nipple there was a mass extending to the subcutaneous tissues and measuring 4.9 cm. There was a small superficial fluid collection overlying this consistent with a hematoma. The left axilla was benign.  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  The patient's subsequent history is as detailed below  INTERVAL HISTORY: [name] is here today to discuss her biopsy results that are consistent with recurrence.  She received these biopsy results, and she is concerned that her cancer may have spread somewhere else.  She odes have CT chest scheduled for Friday.     REVIEW OF SYSTEMS: [name] is anxious.  She takes Paxil 12m per day in addition to Buspar.   She also takes Xanax.  She was cut back to Xanax 0.277mby Dr. HoGeorgiann Cockerand she says she really isn't doing well at that dose.  She does report some residual bruising and soreness at her left breast biopsy site, but denies any other issues and a detailed ROS was otherwise non contributory.    PAST MEDICAL HISTORY: Past Medical History:  Diagnosis Date  . Anxiety   . CAP (community acquired pneumonia)   . Depression   . Malignant neoplasm of upper-inner quadrant of left female breast (HCCrane12/09/2016  . Personal history of chemotherapy   . Personal history of radiation therapy   . Tobacco use     PAST SURGICAL HISTORY: Past Surgical History:  Procedure Laterality Date  . ABDOMINAL HYSTERECTOMY  2011   Laparoscopic  . AUGMENTATION MAMMAPLASTY Bilateral 03/2017  . BREAST LUMPECTOMY Left 04/05/2017  . BREAST LUMPECTOMY WITH SENTINEL LYMPH NODE BIOPSY Left 04/07/2017  . BREAST REDUCTION SURGERY Left 04/07/2017  . CHOLECYSTECTOMY    . open reduction left ankle    . SKIN GRAFT Left 06/19/2017   bil skin grafts to breasts    FAMILY HISTORY Family History  Problem Relation Age of Onset  . Heart disease Mother   . Heart disease Father   . Cancer  Sister        ovarian ca  the patient's father died at the age of 31 from a myocardial infarction. The patient's mother died at the age of 103 with congestive heart failure. The patient had one brother, 2 sisters. One of her sisters was diagnosed with ovarian cancer at age 40 and died at age 53 from that disease  GYNECOLOGIC  HISTORY:  No LMP recorded. Patient has had a hysterectomy. Menarche age 31, first live birth age 21. The patient is GX P1. She stopped having periods approxiately 2008. She did not take hormone replacement. She used oral contraceptives for approxi 30 years with no complications.  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 Her husband [name] is retired from [employer]. Their daughter [name] lives in [place] and is Mudlogger of the [employer] in [employer].     ADVANCED DIRECTIVES: not in place   HEALTH MAINTENANCE: Social History  Substance Use Topics  . Smoking status: Former Smoker    Packs/day: 0.50    Years: 20.00    Quit date: 10/21/2016  . Smokeless tobacco: Never Used  . Alcohol use No     Colonoscopy:  PAP:  Bone density:   No Known Allergies  Current Outpatient Prescriptions  Medication Sig Dispense Refill  . acetaminophen (TYLENOL) 325 MG tablet Take 650 mg by mouth every 6 (six) hours as needed for moderate pain.     . busPIRone (BUSPAR) 30 MG tablet Take 15 mg by mouth daily.     . cholestyramine (QUESTRAN) 4 g packet Take 1 packet (4 g total) by mouth 3 (three) times daily with meals. 60 each 12  . diphenhydrAMINE (BENADRYL) 25 MG tablet Take 25 mg by mouth as needed.    Marland Kitchen ibuprofen (ADVIL,MOTRIN) 200 MG tablet Take 800 mg by mouth every 6 (six) hours as needed for moderate pain.     Marland Kitchen letrozole (FEMARA) 2.5 MG tablet Take by mouth.    . lidocaine-prilocaine (EMLA) cream Apply topically one hour prior to procedure.    . nystatin (MYCOSTATIN) 100000 UNIT/ML suspension Take by mouth.    Marland Kitchen PARoxetine (PAXIL) 40 MG tablet Take 40 mg by mouth every morning.    . ranitidine (ZANTAC) 150 MG tablet Take by mouth.    Marland Kitchen tiZANidine (ZANAFLEX) 4 MG tablet TAKE 1 TABLET BY MOUTH THREE TIMES A DAY    . ALPRAZolam (XANAX XR) 1 MG 24 hr tablet Take 1 tablet (1 mg total) by mouth daily. 30 tablet 0   No current  facility-administered medications for this visit.     OBJECTIVE:  Vitals:   08/13/17 0928  BP: (!) 156/85  Pulse: 81  Resp: 17  Temp: 98.4 F (36.9 C)  SpO2: 100%     Body mass index is 30.38 kg/m.    ECOG FS:0 - Asymptomatic GENERAL: Patient is a well appearing female in no acute distress.  Accompanied by her husband. HEENT:  Sclerae anicteric.  PERRL. Oropharynx clear and moist. No ulcerations or evidence of oropharyngeal candidiasis. Neck is supple.  NODES:  No cervical, supraclavicular, or axillary lymphadenopathy palpated.  BREAST EXAM:  S/p bilateral breast reductions, scars are healing well, she does continues to have some ecchymosis at left inner breast, with mild swelling.   LUNGS:  Clear to auscultation bilaterally.  No wheezes or rhonchi. HEART:  Regular rate and rhythm. No murmur appreciated. ABDOMEN:  Soft, nontender.  Positive,  normoactive bowel sounds. No organomegaly palpated. MSK:  No focal spinal tenderness to palpation. Full range of motion bilaterally in the upper extremities. EXTREMITIES:  No peripheral edema.   SKIN:  Clear with no obvious rashes or skin changes. No nail dyscrasia. NEURO:  Nonfocal. Well oriented.  Appropriate affect.    LAB RESULTS:  CMP     Component Value Date/Time   NA 142 08/01/2017 1448   K 4.2 08/01/2017 1448   CL 101 01/07/2016 0614   CO2 25 08/01/2017 1448   GLUCOSE 102 08/01/2017 1448   BUN 15.9 08/01/2017 1448   CREATININE 0.8 08/01/2017 1448   CALCIUM 9.7 08/01/2017 1448   PROT 7.2 08/01/2017 1448   ALBUMIN 4.3 08/01/2017 1448   AST 15 08/01/2017 1448   ALT 14 08/01/2017 1448   ALKPHOS 127 08/01/2017 1448   BILITOT 0.53 08/01/2017 1448   GFRNONAA >60 01/07/2016 0614   GFRAA >60 01/07/2016 0614    INo results found for: SPEP, UPEP  Lab Results  Component Value Date   WBC 8.1 08/01/2017   NEUTROABS 6.0 08/01/2017   HGB 13.3 08/01/2017   HCT 40.7 08/01/2017   MCV 83.9 08/01/2017   PLT 273 08/01/2017       Chemistry      Component Value Date/Time   NA 142 08/01/2017 1448   K 4.2 08/01/2017 1448   CL 101 01/07/2016 0614   CO2 25 08/01/2017 1448   BUN 15.9 08/01/2017 1448   CREATININE 0.8 08/01/2017 1448      Component Value Date/Time   CALCIUM 9.7 08/01/2017 1448   ALKPHOS 127 08/01/2017 1448   AST 15 08/01/2017 1448   ALT 14 08/01/2017 1448   BILITOT 0.53 08/01/2017 1448       No results found for: LABCA2  No components found for: LABCA125  No results for input(s): INR in the last 168 hours.  Urinalysis    Component Value Date/Time   COLORURINE ORANGE (A) 12/27/2015 1339   APPEARANCEUR CLOUDY (A) 12/27/2015 1339   LABSPEC 1.030 12/27/2015 1339   PHURINE 5.5 12/27/2015 1339   GLUCOSEU NEGATIVE 12/27/2015 1339   HGBUR NEGATIVE 12/27/2015 1339   BILIRUBINUR SMALL (A) 12/27/2015 1339   KETONESUR 15 (A) 12/27/2015 1339   PROTEINUR 100 (A) 12/27/2015 1339   NITRITE NEGATIVE 12/27/2015 1339   LEUKOCYTESUR TRACE (A) 12/27/2015 1339     STUDIES: August echo results reviewed with the patient  ELIGIBLE FOR AVAILABLE RESEARCH PROTOCOL: no  ASSESSMENT: 61 y.o. [employer], [place] woman status post left breast upper inner quadrant biopsy 09/30/2016 for a clinical T2 N0, stage II a invasive ductal carcinoma, grade 3, estrogen receptor positive, progesterone receptor negative, HER-2 not amplified, with an MIB-1 of 80%.  (1) genetics testing pending  (2) neoadjuvant chemotherapy given between 10/30/2016 and 03/12/2017 consisting of cyclophosphamide and doxorubicin in dose dense fashion 4  followed by paclitaxel 1, then Abraxane (x8?)  further treatments discontinued because of neuropathy.  (3) status post left lumpectomy with oncoplasty and right reduction mammoplasty showing a residual T2 N1 invasive ductal carcinoma, with lymphovascular invasion, but negative margins; the tumor is now HER-2 positive.  (a) postop PET scan showed no evidence of metastatic disease.  (4) adjuvant  trastuzumab/Pertuzumab started 06/20/2017 (received at [facility]) started here on 07/11/2017  (a) Echo on 06/18/2017 at [facility]: LVEF 55-60%  (5) adjuvant radiation to follow  (5) anti-estrogens: Letrozole daily started 03/26/2017  (a) DEXA on 04/03/2017 demonstrated a T score of -1.3 in the right femoral  neck  (b) Zometa given at [facility] on 06/20/2017  RECURRENCE: (1) Patient developed left inner breast nodule and underwent mammogram and ultrasound on 08/06/17 showing three areas of concern at 10 o'clock, 1130 o'clock, and at 8 o'clock. Biopsy on 08/07/2017 that demonstrated at 11:30 fat necrosis, however at 10:00 it was positive for IDC, grade 3, ER+(50%), PR-(0%), Ki-67 90%, HER-2 negative (ratio 1.48).  PLAN:  To discuss her plan, Dr. Jana Hakim came in and reviewed his plan with her entirely.  She will continue Trastuzumab/Pertuzumab for the part of the tumor that was HER-2 positive.  She will undergo CT chest to see if the cancer is anywhere else.  If negative, she will undergo PET scan.  If she has not metastases, the plan would be for surgery, chemotherapy with Gemcitabine and Carboplatin given on days 1 and 8 of a 21 day cycle.  This would be followed by radiation, and a different anti estrogen therapy.  She may be eligible for the [place] trial, or an MTOR inhibitor such as Palbociclib or Abemaciclib.  If she does have metastases, then the goal of treatment changes to control, and therapy will not have to be as aggressive.    We will work to get Neysa into see Dr. Excell Seltzer, get the staging studies, and then go from there.  She knows to call between now and her next appointment for any questions or concerns.     Gardenia Phlegm, NP  08/13/17 10:34 AM Medical Oncology and Hematology [facility] [address] [place], [place] [address] Tel. [phone]    Fax. [phone]   ADDENDUM: I reviewed Terry's situation with her and her husband. She had standard/optimal  chemotherapy, and surgery showing significant residual tumor burden. She was started on anti-HER-2 treatment, and also on letrozole. She now has a HER-2 negative, progesterone receptor negative recurrence (similar to her original tumor) which not only survived the original chemotherapy but developed through aromatase inhibitor therapy. Needless to say, this is worrisome.  We are going to start staging her with a CT of the chest and likely a PET scan. Assuming that is negative, the next step is surgery. She is considering bilateral mastectomies, which is fine, so long as she understands that this will not affect survival as compared to lumpectomy, assuming that is feasible. She will need radiation whether or not she has a mastectomy and she will need in my opinion more chemotherapy again regardless of whether or not she has bilateral mastectomies.  Of course we could argue that since the cancer survived optimal chemotherapy it is likely to survive additional treatment. However if we do not have metastatic disease I would like to do everything possible to increase the chance for cure. Accordingly I would give her carboplatin and gemcitabine on days 1 and 8 of each 21 day cycle for 4 cycles. Of course we would continue the Herceptin and progesterone as before. She already has a port in place.  We could then consider participation in the [place] trial assuming that is open and she qualifies, or consider adding palbociclib to her antiestrogen therapy, which most likely will be Faslodex.  She is scheduled for CT of the chest this Friday, 08/15/2017. I will see her 08/22/2017 to continue to firm up the overall treatment plan. Ideally she will see Dr. Excell Seltzer her surgeon in between to make a definitive surgical decision.  I offered her referral to a tertiary care center if she wished to consider other alternatives but at  this point she wishes to proceed with this plan.  I personally saw this patient and  performed a substantive portion of this encounter with the listed APP documented above.   Chauncey Cruel, MD Medical Oncology and Hematology [facility] [address] [place], [place] [address] Tel. [phone]    Fax. [phone]

## 2017-08-15 ENCOUNTER — Ambulatory Visit (HOSPITAL_COMMUNITY)
Admission: RE | Admit: 2017-08-15 | Discharge: 2017-08-15 | Disposition: A | Discharge status: Home / Self Care | Payer: BLUE CROSS/BLUE SHIELD | Source: Ambulatory Visit | Attending: Oncology | Admitting: Oncology

## 2017-08-15 ENCOUNTER — Encounter: Payer: Self-pay | Admitting: Oncology

## 2017-08-15 DIAGNOSIS — Z170 Estrogen receptor positive status [ER+]: Secondary | ICD-10-CM | POA: Insufficient documentation

## 2017-08-15 DIAGNOSIS — C50212 Malignant neoplasm of upper-inner quadrant of left female breast: Secondary | ICD-10-CM | POA: Insufficient documentation

## 2017-08-15 DIAGNOSIS — C50912 Malignant neoplasm of unspecified site of left female breast: Secondary | ICD-10-CM

## 2017-08-15 DIAGNOSIS — R911 Solitary pulmonary nodule: Secondary | ICD-10-CM | POA: Diagnosis not present

## 2017-08-15 MED ORDER — IOPAMIDOL (ISOVUE-300) INJECTION 61%
75.0000 mL | Freq: Once | INTRAVENOUS | Status: AC | PRN
  Administered 2017-08-15: 75 mL via INTRAVENOUS

## 2017-08-15 MED ORDER — IOPAMIDOL (ISOVUE-300) INJECTION 61%
INTRAVENOUS | Status: AC
  Filled 2017-08-15: qty 75

## 2017-08-17 ENCOUNTER — Other Ambulatory Visit: Payer: Self-pay | Admitting: Oncology

## 2017-08-17 NOTE — Progress Notes (Unsigned)
I called [name] and gave her the results of her CT scan of the chest.  She is at this point planning on bilateral mastectomies.  She is coming on November 2 for trastuzumab/pertuzumab and I have added an appointment with me so we can discuss the scan in more detail.  It might be useful to obtain a PET at this point.

## 2017-08-18 ENCOUNTER — Other Ambulatory Visit: Payer: Self-pay | Admitting: *Deleted

## 2017-08-18 ENCOUNTER — Encounter: Payer: Self-pay | Admitting: Oncology

## 2017-08-18 DIAGNOSIS — Z170 Estrogen receptor positive status [ER+]: Principal | ICD-10-CM

## 2017-08-18 DIAGNOSIS — C50912 Malignant neoplasm of unspecified site of left female breast: Secondary | ICD-10-CM

## 2017-08-18 DIAGNOSIS — C50212 Malignant neoplasm of upper-inner quadrant of left female breast: Secondary | ICD-10-CM

## 2017-08-22 ENCOUNTER — Ambulatory Visit: Payer: BLUE CROSS/BLUE SHIELD

## 2017-08-22 ENCOUNTER — Ambulatory Visit: Payer: BLUE CROSS/BLUE SHIELD | Admitting: Oncology

## 2017-08-22 ENCOUNTER — Other Ambulatory Visit: Payer: BLUE CROSS/BLUE SHIELD

## 2017-08-25 ENCOUNTER — Telehealth: Payer: Self-pay | Admitting: Oncology

## 2017-08-25 HISTORY — PX: MASTECTOMY: SHX3

## 2017-08-25 NOTE — Telephone Encounter (Signed)
R/s appt per 11/1 sch message - r/s chemo . Left message for patient with appt date and time.

## 2017-09-01 ENCOUNTER — Ambulatory Visit (HOSPITAL_COMMUNITY): Payer: BLUE CROSS/BLUE SHIELD | Admitting: Cardiology

## 2017-09-01 ENCOUNTER — Other Ambulatory Visit (HOSPITAL_COMMUNITY): Payer: BLUE CROSS/BLUE SHIELD

## 2017-09-10 ENCOUNTER — Other Ambulatory Visit: Payer: Self-pay | Admitting: *Deleted

## 2017-09-10 DIAGNOSIS — C50212 Malignant neoplasm of upper-inner quadrant of left female breast: Secondary | ICD-10-CM

## 2017-09-10 DIAGNOSIS — Z170 Estrogen receptor positive status [ER+]: Principal | ICD-10-CM

## 2017-09-10 MED ORDER — ALPRAZOLAM ER 1 MG PO TB24: 1.0000 mg | ORAL_TABLET | Freq: Every day | ORAL | 1 refills | Status: DC

## 2017-09-12 ENCOUNTER — Ambulatory Visit: Payer: BLUE CROSS/BLUE SHIELD

## 2017-09-12 ENCOUNTER — Other Ambulatory Visit: Payer: BLUE CROSS/BLUE SHIELD

## 2017-09-17 ENCOUNTER — Other Ambulatory Visit: Payer: Self-pay | Admitting: Oncology

## 2017-09-17 NOTE — Progress Notes (Unsigned)
The patient's was evaluated by Dr. Adair Laundry at [facility] and underwent left mastectomy and axillary lymph node dissection August 25, 2017.  The pathology (([id number]) showed a 4.2 cm invasive ductal carcinoma, grade 3, with 1 of 10 lymph nodes involved.  Margins were negative.  The prognostic panel showed the tumor to be estrogen and progesterone receptor negative as well as her to not amplified by IHC.  Post mastectomy radiation has been advised.  In addition the patient had a CT scan August 15, 2017 showing new small left lower lobe pulmonary nodule which by virtue of being new is suspicious for metastatic disease.  She is scheduled for a return visit here 09/19/2017.  On review of this complicated case, she originally had an invasive ductal carcinoma which was estrogen receptor positive but progesterone receptor and HER-2 negative, treated neoadjuvantly with cyclophosphamide/doxorubicin then paclitaxel/Abraxane (total of 9 treatments, discontinued because of neuropathy).  On left lumpectomy she had residual T2N1 disease which was now HER-2 positive.  PET scan was negative.  She was started on trastuzumab and Pertuzumab late August 2018.  She also was started on antiestrogens June 2018.  She now has a triple negative tumor, which may be separate from the original primary.  It could be that we have successfully treated the estrogen receptor positive and HER-2 positive portions of this tumor.  I would consider carboplatin/Gemzar given days 1 and 8 of each 21-day cycle for a minimum of 4 cycles, before adjuvant radiation, and would consider continuing anti-HER-2 treatment (Herceptin alone) to complete a year.  Also she would need a PET scan for restaging at some point in her treatment course  Finally I do not have copy of genetics reports and this needs to be followed up on

## 2017-09-18 NOTE — Progress Notes (Signed)
[name]  [phone] [phone]     ID: JAKHIA BUXTON DOB: 1956/08/22  MR#: [id number]  TMA#:[id number]  Patient Care Team: Chesley Noon, MD as PCP - General (Family Medicine) Excell Seltzer, MD as Consulting Physician (General Surgery) [name], Debra Dad, MD as Consulting Physician (Oncology) Gery Pray, MD as Consulting Physician (Radiation Oncology) Collene Gobble, MD as Consulting Physician (Pulmonary Disease) Verdell Carmine, MD as Referring Physician (Internal Medicine) Larey Dresser, MD as Consulting Physician (Cardiology) OTHER MD:  CHIEF COMPLAINT: estrogen receptor positive breast cancer  CURRENT TREATMENT: Adjuvant Trastuzumab/ Pertuzumab   BREAST CANCER HISTORY: From the original intake note:  "[name]" had a fall while traveling living to [place] in their RV and injure her left upper arm and  her left breast . Approximately 3 weeks later she noted a mass in the left breast upper inner quadrant. It was somewhat tender. She brought this to medical attention and on 09/27/2016 underwent bilateral diagnostic mammography with tomography [facility] The breast density was category B. The right breast was benign. In the left breast upper inner quadrant there was a large lobulated hyperdense mass which by exam was firm and nonmobile. By ultrasonography in the 11:30 o'clock radius 4 cm from the nipple there was a mass extending to the subcutaneous tissues and measuring 4.9 cm. There was a small superficial fluid collection overlying this consistent with a hematoma. The left axilla was benign.  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  The patient's subsequent history is as detailed below  INTERVAL HISTORY: [name] returns today for follow-up and treatment of her recurrent now triple negative breast cancer. She is accompanied by her husband.  Since her last visit here she was evaluated by Dr. Adair Laundry at [facility] and underwent left mastectomy and axillary lymph node dissection August 25, 2017.  The pathology (([id number]) showed a 4.2 cm invasive ductal carcinoma, grade 3, with 1 of 12  lymph nodes involved, w/o ECE.  Margins were negative.  The prognostic panel showed the tumor to be estrogen and progesterone receptor negative as well as her to not amplified by IHC. She tolerated surgery well and notes having an area to her right breast that has become slightly infected. The patient notes that the drain for that breast had fallen out while on vacation. She has been referred to wound care for further treatment.  She continues on trastuzumab and Pertuzumab, with her next dose due 09/22/2017.  REVIEW OF SYSTEMS: [name] is doing well. She denies unusual headaches, visual changes, nausea, vomiting, or dizziness. There has been no unusual cough, phlegm production, or pleurisy. This been no change in bowel or bladder habits. She denies unexplained fatigue or unexplained weight loss, bleeding, rash, or fever. A detailed review of systems was otherwise entirely stable.    PAST MEDICAL HISTORY: Past Medical History:  Diagnosis Date  . Anxiety   . CAP (community acquired pneumonia)   . Depression   . Malignant neoplasm of upper-inner quadrant of left female breast ([place]) 10/01/2016  . Personal history of chemotherapy   . Personal history of radiation therapy   . Tobacco use     PAST SURGICAL HISTORY: Past Surgical History:  Procedure  Laterality Date  . ABDOMINAL HYSTERECTOMY  2011   Laparoscopic  . AUGMENTATION MAMMAPLASTY Bilateral 03/2017  . BREAST LUMPECTOMY Left 04/05/2017  . BREAST LUMPECTOMY WITH  SENTINEL LYMPH NODE BIOPSY Left 04/07/2017  . BREAST REDUCTION SURGERY Left 04/07/2017  . CHOLECYSTECTOMY    . open reduction left ankle    . SKIN GRAFT Left 06/19/2017   bil skin grafts to breasts    FAMILY HISTORY Family History  Problem Relation Age of Onset  . Heart disease Mother   . Heart disease Father   . Cancer Sister        ovarian ca  the patient's father died at the age of 30 from a myocardial infarction. The patient's mother died at the age of 50 with congestive heart failure. The patient had one brother, 2 sisters. One of her sisters was diagnosed with ovarian cancer at age 34 and died at age 4 from that disease  GYNECOLOGIC HISTORY:  No LMP recorded. Patient has had a hysterectomy. Menarche age 81, first live birth age 73. The patient is GX P1. She stopped having periods approxiately 2008. She did not take hormone replacement. She used oral contraceptives for approxi 30 years with no complications.  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 Her husband [name] is retired from [employer]. Their daughter [name] lives in [place] and is Mudlogger of the [employer] in [employer].     ADVANCED DIRECTIVES: not in place   HEALTH MAINTENANCE: Social History   Tobacco Use  . Smoking status: Former Smoker    Packs/day: 0.50    Years: 20.00    Pack years: 10.00    Last attempt to quit: 10/21/2016    Years since quitting: 0.9  . Smokeless tobacco: Never Used  Substance Use Topics  . Alcohol use: No  . Drug use: No     Colonoscopy:  PAP:  Bone density:   No Known Allergies  Current Outpatient Medications  Medication Sig Dispense Refill  . acetaminophen (TYLENOL) 325 MG tablet Take 650 mg by mouth every 6 (six) hours as needed for moderate pain.     Marland Kitchen ALPRAZolam (XANAX XR) 1 MG 24 hr tablet Take 1 tablet (1 mg total) by mouth daily. 30 tablet 1  . busPIRone (BUSPAR) 30 MG tablet Take 15 mg by mouth  daily.     . cholestyramine (QUESTRAN) 4 g packet Take 1 packet (4 g total) by mouth 3 (three) times daily with meals. 60 each 12  . diphenhydrAMINE (BENADRYL) 25 MG tablet Take 25 mg by mouth as needed.    Marland Kitchen ibuprofen (ADVIL,MOTRIN) 200 MG tablet Take 800 mg by mouth every 6 (six) hours as needed for moderate pain.     Marland Kitchen letrozole (FEMARA) 2.5 MG tablet Take by mouth.    . lidocaine-prilocaine (EMLA) cream Apply topically one hour prior to procedure.    . nystatin (MYCOSTATIN) 100000 UNIT/ML suspension Take by mouth.    Marland Kitchen PARoxetine (PAXIL) 40 MG tablet Take 40 mg by mouth every morning.    . ranitidine (ZANTAC) 150 MG tablet Take by mouth.    Marland Kitchen tiZANidine (ZANAFLEX) 4 MG tablet TAKE 1 TABLET BY MOUTH THREE TIMES A DAY     No current facility-administered medications for this visit.     OBJECTIVE: Middle-aged white woman who appears stated age 31:   09/19/17 1021  BP: 139/69  Pulse: (!) 101  Resp: 20  Temp: 98.8 F (37.1 C)  SpO2: 100%     Body mass index is 29.99 kg/m.   Filed Weights   09/19/17 1021  Weight: 203 lb 1.6 oz (92.1 kg)     ECOG FS:1 - Symptomatic but completely ambulatory   Sclerae unicteric, pupils round and equal Oropharynx clear and moist No cervical or supraclavicular adenopathy Lungs no rales or rhonchi Heart regular rate and rhythm Abd soft, nontender, positive bowel sounds MSK no focal spinal tenderness, no upper extremity lymphedema Neuro: nonfocal, well oriented, appropriate affect Breasts: Status post bilateral mastectomies.  There is still a drain on the left side, with serosanguineous fluid, 25 cc in the bulb.    LAB RESULTS:  CMP     Component Value Date/Time   NA 142 08/01/2017 1448   K 4.2 08/01/2017 1448   CL 101 01/07/2016 0614   CO2 25 08/01/2017 1448   GLUCOSE 102 08/01/2017 1448   BUN 15.9 08/01/2017 1448   CREATININE 0.8 08/01/2017 1448   CALCIUM 9.7 08/01/2017 1448   PROT 7.2 08/01/2017 1448   ALBUMIN 4.3 08/01/2017  1448   AST 15 08/01/2017 1448   ALT 14 08/01/2017 1448   ALKPHOS 127 08/01/2017 1448   BILITOT 0.53 08/01/2017 1448   GFRNONAA >60 01/07/2016 0614   GFRAA >60 01/07/2016 0614    INo results found for: SPEP, UPEP  Lab Results  Component Value Date   WBC 7.2 09/19/2017   NEUTROABS 4.5 09/19/2017   HGB 12.8 09/19/2017   HCT 41.1 09/19/2017   MCV 89.5 09/19/2017   PLT 263 09/19/2017      Chemistry      Component Value Date/Time   NA 142 08/01/2017 1448   K 4.2 08/01/2017 1448   CL 101 01/07/2016 0614   CO2 25 08/01/2017 1448   BUN 15.9 08/01/2017 1448   CREATININE 0.8 08/01/2017 1448      Component Value Date/Time   CALCIUM 9.7 08/01/2017 1448   ALKPHOS 127 08/01/2017 1448   AST 15 08/01/2017 1448   ALT 14 08/01/2017 1448   BILITOT 0.53 08/01/2017 1448       No results found for: LABCA2  No components found for: LABCA125  No results for input(s): INR in the last 168 hours.  Urinalysis    Component Value Date/Time   COLORURINE ORANGE (A) 12/27/2015 1339   APPEARANCEUR CLOUDY (A) 12/27/2015 1339   LABSPEC 1.030 12/27/2015 1339   PHURINE 5.5 12/27/2015 1339   GLUCOSEU NEGATIVE 12/27/2015 1339   HGBUR NEGATIVE 12/27/2015 1339   BILIRUBINUR SMALL (A) 12/27/2015 1339   KETONESUR 15 (A) 12/27/2015 1339   PROTEINUR 100 (A) 12/27/2015 1339   NITRITE NEGATIVE 12/27/2015 1339   LEUKOCYTESUR TRACE (A) 12/27/2015 1339     STUDIES: CT Chest W Contrast, 08/15/2017, shows possible metastatic disease to left lower lobe pulmonary nodule.   ELIGIBLE FOR AVAILABLE RESEARCH PROTOCOL: no  ASSESSMENT: 61 y.o. [employer], [place] woman status post left breast upper inner quadrant biopsy 09/30/2016 for a clinical T2 N0, stage II a invasive ductal carcinoma, grade 3, estrogen receptor positive, progesterone receptor negative, HER-2 not amplified, with an MIB-1 of 80%.  (1) genetics testing pending  (2) neoadjuvant chemotherapy given between 10/30/2016 and 03/12/2017  consisting of cyclophosphamide and doxorubicin in dose dense fashion 4  followed by paclitaxel 1, then Abraxane (x8?)  further treatments discontinued because of neuropathy.  (3) status post left lumpectomy with oncoplasty and right reduction mammoplasty showing a residual T2 N1 invasive ductal  carcinoma, with lymphovascular invasion, but negative margins; the tumor is now HER-2 positive.  (a) postop PET scan showed no evidence of metastatic disease.  (4) adjuvant trastuzumab/Pertuzumab started 06/20/2017 (received at [facility]) started here on 07/11/2017--continue through August 2019  (a) Echo on 06/18/2017 at [facility]: LVEF 55-60%  (5) adjuvant radiation to follow  (6) anti-estrogens: Letrozole daily started 03/26/2017  (a) DEXA on 04/03/2017 demonstrated a T score of -1.3 in the right femoral neck  (b) Zometa given at [facility] on 06/20/2017  RECURRENCE: (7) Patient developed left inner breast nodule and underwent mammogram and ultrasound on 08/06/17 showing three areas of concern at 10 o'clock, 1130 o'clock, and at 8 o'clock. Biopsy on 08/07/2017 that demonstrated at 11:30 fat necrosis, however at 10:00 it was positive for IDC, grade 3, ER+(50%), PR-(0%), Ki-67 90%, HER-2 negative (ratio 1.48).  (a) staging chest CT 08/15/2017 shows 0.5 cm left lower lobe lung nodule of uncertain significance  (8) status post left modified radical mastectomy and right simple mastectomy 08/25/2017 showing  (a) right breast, no evidence of malignancy  (b) left breast, ypT2 ypN1 invasive ductal carcinoma, grade 3, with negative margins; repeat prognostic panel: Triple negative  (9) to start adjuvant chemotherapy with cyclophosphamide/methotrexate/fluorouracil 11/10/2016  (10) adjuvant radiation with cycles 3-5 of chemotherapy  (11) resume antiestrogens at the completion of chemotherapy   PLAN:  I spent a little over 40 minutes today with [name] and her husband going over her complex situation.  Every time she has  surgery we get a different prognostic pattern.  We have gone from estrogen receptor positive, HER-2 negative, 2 HER-2 positive, 2 triple negative.  Furthermore we have a question whether we are seeing a second primary in the same breast or recurrence of the earlier tumor.  I think it would be helpful to have all the pathology reviewed (each of the 3 procedures was done at a different center) and I have asked Dr. Lyndon Code if he could to undertake that task  We then discussed her situation which I have also curb sided with the [facility] group.  There is unanimously that she needs adjuvant chemotherapy and radiation.  It was also suggested that she complete her year of Herceptin and of course she will go on antiestrogens at the end of chemotherapy.  We considered carboplatin/Taxotere, carboplatin/gemcitabine, but ended up with CMF.  The plan will be for a total of 8 cycles of CMF beginning at 10/10/2017.  Today we discussed the possible toxicity side effects and complications of this combination which is generally well-tolerated and is very effective.  Also she can undergo radiation right through the CMF treatments.  We will simply omit the methotrexate portion during the radiation treatment.[name] understands the CT scan of the chest she had late October suggests she may have metastatic disease and she will need restaging studies which we plan to do late 2022/11/27.  At this point however we are treating for cure.  She will have her next set of antibodies 09/22/2017 and then see me again 10/10/2017 for her first cycle of CMF/HP.  We will review supportive medicines at that time  She knows to call for any other issues that may develop before her next visit here.    [name], Debra Dad, MD  09/19/17 11:03 AM Medical Oncology and Hematology [facility] [address] [place], [place] [address] Tel. [phone]    Fax. [phone]  This document serves as a record of services personally  performed by [name],  Hollie Wojahn, MD. It was created on his behalf by Margit Banda, a trained medical scribe. The creation of this record is based on the scribe's personal observations and the provider's statements to them.   I have reviewed the above documentation for accuracy and completeness, and I agree with the above.

## 2017-09-19 ENCOUNTER — Ambulatory Visit: Payer: BLUE CROSS/BLUE SHIELD

## 2017-09-19 ENCOUNTER — Other Ambulatory Visit (HOSPITAL_BASED_OUTPATIENT_CLINIC_OR_DEPARTMENT_OTHER): Payer: BLUE CROSS/BLUE SHIELD

## 2017-09-19 ENCOUNTER — Ambulatory Visit (HOSPITAL_BASED_OUTPATIENT_CLINIC_OR_DEPARTMENT_OTHER): Payer: BLUE CROSS/BLUE SHIELD | Admitting: Oncology

## 2017-09-19 ENCOUNTER — Telehealth: Payer: Self-pay | Admitting: Oncology

## 2017-09-19 VITALS — BP 139/69 | HR 101 | Temp 98.8°F | Resp 20 | Ht 69.0 in | Wt 203.1 lb

## 2017-09-19 DIAGNOSIS — Z170 Estrogen receptor positive status [ER+]: Principal | ICD-10-CM

## 2017-09-19 DIAGNOSIS — C50912 Malignant neoplasm of unspecified site of left female breast: Secondary | ICD-10-CM

## 2017-09-19 DIAGNOSIS — C50212 Malignant neoplasm of upper-inner quadrant of left female breast: Secondary | ICD-10-CM

## 2017-09-19 LAB — CBC WITH DIFFERENTIAL/PLATELET
BASO%: 0.4 % (ref 0.0–2.0)
Basophils Absolute: 0 10*3/uL (ref 0.0–0.1)
EOS%: 15.1 % — AB (ref 0.0–7.0)
Eosinophils Absolute: 1.1 10*3/uL — ABNORMAL HIGH (ref 0.0–0.5)
HCT: 41.1 % (ref 34.8–46.6)
HGB: 12.8 g/dL (ref 11.6–15.9)
LYMPH#: 1.2 10*3/uL (ref 0.9–3.3)
LYMPH%: 17.2 % (ref 14.0–49.7)
MCH: 27.9 pg (ref 25.1–34.0)
MCHC: 31.1 g/dL — AB (ref 31.5–36.0)
MCV: 89.5 fL (ref 79.5–101.0)
MONO#: 0.4 10*3/uL (ref 0.1–0.9)
MONO%: 4.8 % (ref 0.0–14.0)
NEUT%: 62.5 % (ref 38.4–76.8)
NEUTROS ABS: 4.5 10*3/uL (ref 1.5–6.5)
Platelets: 263 10*3/uL (ref 145–400)
RBC: 4.59 10*6/uL (ref 3.70–5.45)
RDW: 14.9 % — ABNORMAL HIGH (ref 11.2–14.5)
WBC: 7.2 10*3/uL (ref 3.9–10.3)

## 2017-09-19 LAB — COMPREHENSIVE METABOLIC PANEL
ALBUMIN: 4 g/dL (ref 3.5–5.0)
ALK PHOS: 109 U/L (ref 40–150)
ALT: 11 U/L (ref 0–55)
AST: 13 U/L (ref 5–34)
Anion Gap: 9 mEq/L (ref 3–11)
BILIRUBIN TOTAL: 0.29 mg/dL (ref 0.20–1.20)
BUN: 18.8 mg/dL (ref 7.0–26.0)
CALCIUM: 9.3 mg/dL (ref 8.4–10.4)
CO2: 24 mEq/L (ref 22–29)
Chloride: 105 mEq/L (ref 98–109)
Creatinine: 0.8 mg/dL (ref 0.6–1.1)
GLUCOSE: 132 mg/dL (ref 70–140)
POTASSIUM: 4.5 meq/L (ref 3.5–5.1)
SODIUM: 139 meq/L (ref 136–145)
TOTAL PROTEIN: 7 g/dL (ref 6.4–8.3)

## 2017-09-19 MED ORDER — LORAZEPAM 0.5 MG PO TABS: 0.5000 mg | ORAL_TABLET | Freq: Four times a day (QID) | ORAL | 0 refills | Status: DC | PRN

## 2017-09-19 MED ORDER — DEXAMETHASONE 4 MG PO TABS: 8.0000 mg | ORAL_TABLET | Freq: Every day | ORAL | 1 refills | Status: DC

## 2017-09-19 MED ORDER — PROCHLORPERAZINE MALEATE 10 MG PO TABS: 10.0000 mg | ORAL_TABLET | Freq: Four times a day (QID) | ORAL | 1 refills | Status: DC | PRN

## 2017-09-19 MED ORDER — LIDOCAINE-PRILOCAINE 2.5-2.5 % EX CREA: TOPICAL_CREAM | CUTANEOUS | 3 refills | Status: DC

## 2017-09-19 MED ORDER — ONDANSETRON HCL 8 MG PO TABS: 8.0000 mg | ORAL_TABLET | Freq: Two times a day (BID) | ORAL | 1 refills | Status: DC | PRN

## 2017-09-19 NOTE — Telephone Encounter (Signed)
Gave patient avs and calendar with appts per 11/30 los.

## 2017-09-19 NOTE — Progress Notes (Signed)
DISCONTINUE ON PATHWAY REGIMEN - Breast   Dose-Dense AC q14 days:   A cycle is every 14 days:     Doxorubicin        Dose Mod: None     Cyclophosphamide        Dose Mod: None     Pegfilgrastim        Dose Mod: None  **Always confirm dose/schedule in your pharmacy ordering system**    Paclitaxel 80 mg/m2 Weekly:   Administer weekly:     Paclitaxel        Dose Mod: None  **Always confirm dose/schedule in your pharmacy ordering system**    REASON: Disease Progression PRIOR TREATMENT: [id number]: Dose-Dense AC-T (Paclitaxel Weekly) - [Doxorubicin + Cyclophosphamide q14 Days x 4 Cycles, Followed by Paclitaxel 80 mg/m2 Weekly x 12 Weeks] TREATMENT RESPONSE: Partial Response (PR)  START OFF PATHWAY REGIMEN - Breast   [id number]:CMF (IV cyclophosphamide) q21 days:   A cycle is every 21 days:     Cyclophosphamide      Methotrexate      5-Fluorouracil   **Always confirm dose/schedule in your pharmacy ordering system**    Patient Characteristics: Locoregional  Recurrent Disease - Resected, ER Negative/Unknown, HER2 Negative/Unknown/Equivocal Therapeutic Status: Locoregional Recurrent Disease - Resected ER Status: Negative (-) HER2 Status: Negative (-) PR Status: Negative (-) Intent of Therapy: Curative Intent, Discussed with Patient

## 2017-09-20 LAB — CANCER ANTIGEN 27.29: CAN 27.29: 35.8 U/mL (ref 0.0–38.6)

## 2017-09-22 ENCOUNTER — Ambulatory Visit (HOSPITAL_BASED_OUTPATIENT_CLINIC_OR_DEPARTMENT_OTHER): Payer: BLUE CROSS/BLUE SHIELD

## 2017-09-22 ENCOUNTER — Other Ambulatory Visit: Payer: Self-pay | Admitting: *Deleted

## 2017-09-22 VITALS — BP 122/64 | HR 85 | Temp 98.4°F | Resp 16

## 2017-09-22 DIAGNOSIS — Z5112 Encounter for antineoplastic immunotherapy: Secondary | ICD-10-CM | POA: Diagnosis not present

## 2017-09-22 DIAGNOSIS — C50212 Malignant neoplasm of upper-inner quadrant of left female breast: Secondary | ICD-10-CM

## 2017-09-22 DIAGNOSIS — Z170 Estrogen receptor positive status [ER+]: Principal | ICD-10-CM

## 2017-09-22 MED ORDER — HEPARIN SOD (PORK) LOCK FLUSH 100 UNIT/ML IV SOLN
500.0000 [IU] | Freq: Once | INTRAVENOUS | Status: AC | PRN
  Administered 2017-09-22: 500 [IU]
  Filled 2017-09-22: qty 5

## 2017-09-22 MED ORDER — DIPHENHYDRAMINE HCL 25 MG PO CAPS
25.0000 mg | ORAL_CAPSULE | Freq: Once | ORAL | Status: AC
  Administered 2017-09-22: 25 mg via ORAL

## 2017-09-22 MED ORDER — ACETAMINOPHEN 325 MG PO TABS
650.0000 mg | ORAL_TABLET | Freq: Once | ORAL | Status: AC
  Administered 2017-09-22: 650 mg via ORAL

## 2017-09-22 MED ORDER — SODIUM CHLORIDE 0.9 % IV SOLN
Freq: Once | INTRAVENOUS | Status: AC
  Administered 2017-09-22: 14:00:00 via INTRAVENOUS

## 2017-09-22 MED ORDER — ACETAMINOPHEN 325 MG PO TABS
ORAL_TABLET | ORAL | Status: AC
  Filled 2017-09-22: qty 2

## 2017-09-22 MED ORDER — TRASTUZUMAB CHEMO 150 MG IV SOLR
6.0000 mg/kg | Freq: Once | INTRAVENOUS | Status: AC
  Administered 2017-09-22: 567 mg via INTRAVENOUS
  Filled 2017-09-22: qty 27

## 2017-09-22 MED ORDER — DIPHENHYDRAMINE HCL 25 MG PO CAPS
ORAL_CAPSULE | ORAL | Status: AC
  Filled 2017-09-22: qty 1

## 2017-09-22 MED ORDER — SODIUM CHLORIDE 0.9 % IV SOLN
420.0000 mg | Freq: Once | INTRAVENOUS | Status: AC
  Administered 2017-09-22: 420 mg via INTRAVENOUS
  Filled 2017-09-22: qty 14

## 2017-09-22 MED ORDER — SODIUM CHLORIDE 0.9% FLUSH
10.0000 mL | INTRAVENOUS | Status: DC | PRN
  Administered 2017-09-22: 10 mL
  Filled 2017-09-22: qty 10

## 2017-09-22 NOTE — Patient Instructions (Signed)
[facility] Discharge Instructions for Patients Receiving Chemotherapy  Today you received the following chemotherapy agents Herceptin and Perjeta.   To help prevent nausea and vomiting after your treatment, we encourage you to take your nausea medication as directed.   If you develop nausea and vomiting that is not controlled by your nausea medication, call the clinic.   BELOW ARE SYMPTOMS THAT SHOULD BE REPORTED IMMEDIATELY:  *FEVER GREATER THAN 100.5 F  *CHILLS WITH OR WITHOUT FEVER  NAUSEA AND VOMITING THAT IS NOT CONTROLLED WITH YOUR NAUSEA MEDICATION  *UNUSUAL SHORTNESS OF BREATH  *UNUSUAL BRUISING OR BLEEDING  TENDERNESS IN MOUTH AND THROAT WITH OR WITHOUT PRESENCE OF ULCERS  *URINARY PROBLEMS  *BOWEL PROBLEMS  UNUSUAL RASH Items with * indicate a potential emergency and should be followed up as soon as possible.  Feel free to call the clinic should you have any questions or concerns. The clinic phone number is [phone].  Please show the CHEMO ALERT CARD at check-in to the Emergency Department and triage nurse.   

## 2017-09-22 NOTE — Progress Notes (Signed)
Per note dated 08/01/2017, patient had echo 05/2017 with normal results. Will continue with tx as scheduled.

## 2017-09-26 ENCOUNTER — Ambulatory Visit (HOSPITAL_COMMUNITY)
Admission: RE | Admit: 2017-09-26 | Discharge: 2017-09-26 | Disposition: A | Discharge status: Home / Self Care | Payer: BLUE CROSS/BLUE SHIELD | Source: Ambulatory Visit | Attending: Cardiology | Admitting: Cardiology

## 2017-09-26 ENCOUNTER — Ambulatory Visit (HOSPITAL_BASED_OUTPATIENT_CLINIC_OR_DEPARTMENT_OTHER)
Admission: RE | Admit: 2017-09-26 | Discharge: 2017-09-26 | Disposition: A | Discharge status: Home / Self Care | Payer: BLUE CROSS/BLUE SHIELD | Source: Ambulatory Visit | Attending: Cardiology | Admitting: Cardiology

## 2017-09-26 ENCOUNTER — Encounter (HOSPITAL_COMMUNITY): Payer: Self-pay | Admitting: Cardiology

## 2017-09-26 VITALS — BP 156/80 | HR 91 | Wt 204.6 lb

## 2017-09-26 DIAGNOSIS — Z170 Estrogen receptor positive status [ER+]: Secondary | ICD-10-CM

## 2017-09-26 DIAGNOSIS — C50212 Malignant neoplasm of upper-inner quadrant of left female breast: Secondary | ICD-10-CM

## 2017-09-26 LAB — ECHOCARDIOGRAM COMPLETE: WEIGHTICAEL: 3273.6 [oz_av]

## 2017-09-26 MED ORDER — PERFLUTREN LIPID MICROSPHERE
1.0000 mL | INTRAVENOUS | Status: AC | PRN
  Administered 2017-09-26: 3 mL via INTRAVENOUS
  Filled 2017-09-26: qty 10

## 2017-09-26 NOTE — Patient Instructions (Signed)
Your physician has requested that you have an echocardiogram. Echocardiography is a painless test that uses sound waves to create images of your heart. It provides your doctor with information about the size and shape of your heart and how well your heart's chambers and valves are working. This procedure takes approximately one hour. There are no restrictions for this procedure.  Your physician recommends that you schedule a follow-up appointment in: 3 months with Dr. Aundra Dubin and an echocardiogram

## 2017-09-26 NOTE — Progress Notes (Signed)
  Echocardiogram 2D Echocardiogram has been performed.  [name] 09/26/2017, 11:45 AM

## 2017-09-28 NOTE — Progress Notes (Signed)
Oncology: Dr. Jana Hays  61 yo with history of breath cancer presents for cardio-oncology evaluation.  She was referred by Dr. Jana Hays.  She was diagnosed with breath cancer in 12/17, ER+/PR-/HER2-.  Treated with cyclophosphamide/doxorubicin then Paclitaxel/abraxane.  Left lumpectomy was then done.  Biopsy showed the tumor was now HER2+ and she started trastuzumab/pertuzumab in 8/18.  She had left mastectomy with axillary node dissection in 11/18, tumor now ER-/PR-/HER2-.  Plan for now is to continue Herceptin for a year.  She will also start on cyclophosphamide/MTX/5FU in 1/19.   She is stable symptomatically.  No chest pain or exertional dyspnea.  Echo was done today and reviewed, EF is normal.   PMH: 1. Depression 2. Breast cancer: Diagnosed 12/17, ER+/PR-/HER2-.  Treated with cyclophosphamide/doxorubicin then Paclitaxel/abraxane.  Left lumpectomy was then done.  Biopsy showed the tumor was now HER2+ and she started trastuzumab/pertuzumab in 8/18.  She had left mastectomy with axillary node dissection in 11/18, tumor now ER-/PR-/HER2-.  Plan for now is to continue Herceptin for a year.  She will also start on cyclophosphamide/MTX/5FU in 1/19.  - Echo (3/18): EF 55-60% - Echo (12/18): EF 60-65%, GLS -12.1% but difficult images.   Social History   Socioeconomic History  . Marital status: Married    Spouse name: Not on file  . Number of children: 1  . Years of education: Not on file  . Highest education level: Not on file  Social Needs  . Financial resource strain: Not on file  . Food insecurity - worry: Not on file  . Food insecurity - inability: Not on file  . Transportation needs - medical: Not on file  . Transportation needs - non-medical: Not on file  Occupational History  . Not on file  Tobacco Use  . Smoking status: Former Smoker    Packs/day: 0.50    Years: 20.00    Pack years: 10.00    Last attempt to quit: 10/21/2016    Years since quitting: 0.9  . Smokeless tobacco: Never  Used  Substance and Sexual Activity  . Alcohol use: No  . Drug use: No  . Sexual activity: Not on file  Other Topics Concern  . Not on file  Social History Narrative  . Not on file   Family History  Problem Relation Age of Onset  . Heart disease Mother   . Heart disease Father   . Cancer Sister        ovarian ca   General: NAD Neck: No JVD, no thyromegaly or thyroid nodule.  Lungs: Clear to auscultation bilaterally with normal respiratory effort. CV: Nondisplaced PMI.  Heart regular S1/S2, no S3/S4, no murmur.  No peripheral edema.  No carotid bruit.  Normal pedal pulses.  Abdomen: Soft, nontender, no hepatosplenomegaly, no distention.  Skin: Intact without lesions or rashes.  Neurologic: Alert and oriented x 3.  Psych: Normal affect. Extremities: No clubbing or cyanosis.  HEENT: Normal.   Assessment/Plan: 1. Breast cancer: Patient is currently getting Herceptin treatment, this is planned for 1 year. We discussed the possible cardiotoxicity of Herceptin and the rationale behind echo screening to look for it.  I reviewed today's echo, it is technically difficult and I do not think that strain imaging was accurate.  However, EF is normal. - Repeat echo with office visit in 3 months.   Loralie Champagne 09/28/2017

## 2017-10-03 ENCOUNTER — Other Ambulatory Visit: Payer: BLUE CROSS/BLUE SHIELD

## 2017-10-03 ENCOUNTER — Ambulatory Visit: Payer: BLUE CROSS/BLUE SHIELD

## 2017-10-08 ENCOUNTER — Other Ambulatory Visit: Payer: Self-pay | Admitting: Oncology

## 2017-10-08 NOTE — Progress Notes (Unsigned)
I was called by Beverely Pace regarding [name] situation.  He tells me that unfortunately there has been an significant dehiscence from 1 of the recent surgeries, requiring further work and drains placement.  He thinks we could even treat if the drains are still in place but in terms of practicality she is not can be able to receive her chemo this Friday.  We are moving both the Herceptin and progenitor and the chemotherapy to October 24, 2017

## 2017-10-10 ENCOUNTER — Ambulatory Visit (HOSPITAL_BASED_OUTPATIENT_CLINIC_OR_DEPARTMENT_OTHER): Payer: BLUE CROSS/BLUE SHIELD | Admitting: Oncology

## 2017-10-10 ENCOUNTER — Ambulatory Visit: Payer: BLUE CROSS/BLUE SHIELD

## 2017-10-10 ENCOUNTER — Other Ambulatory Visit (HOSPITAL_BASED_OUTPATIENT_CLINIC_OR_DEPARTMENT_OTHER): Payer: BLUE CROSS/BLUE SHIELD

## 2017-10-10 VITALS — BP 126/74 | HR 91 | Temp 98.4°F | Resp 18 | Ht 69.0 in | Wt 202.0 lb

## 2017-10-10 DIAGNOSIS — C50212 Malignant neoplasm of upper-inner quadrant of left female breast: Secondary | ICD-10-CM

## 2017-10-10 DIAGNOSIS — C50912 Malignant neoplasm of unspecified site of left female breast: Secondary | ICD-10-CM

## 2017-10-10 DIAGNOSIS — Z170 Estrogen receptor positive status [ER+]: Principal | ICD-10-CM

## 2017-10-10 LAB — CBC WITH DIFFERENTIAL/PLATELET
BASO%: 1.5 % (ref 0.0–2.0)
Basophils Absolute: 0.1 10*3/uL (ref 0.0–0.1)
EOS ABS: 0.4 10*3/uL (ref 0.0–0.5)
EOS%: 5.4 % (ref 0.0–7.0)
HCT: 39.8 % (ref 34.8–46.6)
HGB: 12.8 g/dL (ref 11.6–15.9)
LYMPH%: 13.9 % — AB (ref 14.0–49.7)
MCH: 27.1 pg (ref 25.1–34.0)
MCHC: 32.3 g/dL (ref 31.5–36.0)
MCV: 84.1 fL (ref 79.5–101.0)
MONO#: 0.4 10*3/uL (ref 0.1–0.9)
MONO%: 5.4 % (ref 0.0–14.0)
NEUT#: 5.2 10*3/uL (ref 1.5–6.5)
NEUT%: 73.8 % (ref 38.4–76.8)
PLATELETS: 404 10*3/uL — AB (ref 145–400)
RBC: 4.73 10*6/uL (ref 3.70–5.45)
RDW: 15 % — ABNORMAL HIGH (ref 11.2–14.5)
WBC: 7 10*3/uL (ref 3.9–10.3)
lymph#: 1 10*3/uL (ref 0.9–3.3)

## 2017-10-10 LAB — COMPREHENSIVE METABOLIC PANEL
ALT: 9 U/L (ref 0–55)
AST: 11 U/L (ref 5–34)
Albumin: 3.7 g/dL (ref 3.5–5.0)
Alkaline Phosphatase: 121 U/L (ref 40–150)
Anion Gap: 10 mEq/L (ref 3–11)
BUN: 12.2 mg/dL (ref 7.0–26.0)
CHLORIDE: 107 meq/L (ref 98–109)
CO2: 22 meq/L (ref 22–29)
Calcium: 9.2 mg/dL (ref 8.4–10.4)
Creatinine: 0.8 mg/dL (ref 0.6–1.1)
Glucose: 102 mg/dl (ref 70–140)
POTASSIUM: 4.4 meq/L (ref 3.5–5.1)
Sodium: 140 mEq/L (ref 136–145)
Total Bilirubin: 0.26 mg/dL (ref 0.20–1.20)
Total Protein: 7.4 g/dL (ref 6.4–8.3)

## 2017-10-10 NOTE — Progress Notes (Signed)
[place]  Telephone:([phone]) [phone] Fax:(336) [phone]     ID: [name] DOB: 03/19/1956  MR#: [id number]  CVU#:[id number]  Patient Care Team: Chesley Noon, MD as PCP - General (Family Medicine) Excell Seltzer, MD as Consulting Physician (General Surgery) Mack Thurmon, [name], MD as Consulting Physician (Oncology) Gery Pray, MD as Consulting Physician (Radiation Oncology) Collene Gobble, MD as Consulting Physician (Pulmonary Disease) Verdell Carmine, MD as Referring Physician (Internal Medicine) Larey Dresser, MD as Consulting Physician (Cardiology) Beverely Pace, MD as Referring Physician (Surgical Oncology) OTHER MD:  CHIEF COMPLAINT: estrogen receptor positive breast cancer  CURRENT TREATMENT: Adjuvant Trastuzumab/ Pertuzumab and CMF chemotherapy   BREAST CANCER HISTORY: From the original intake note:  "[name]" had a fall while traveling living to [place] in their RV and injure her left upper arm and  her left breast . Approximately 3 weeks later she noted a mass in the left breast upper inner quadrant. It was somewhat tender. She brought this to medical attention and on 09/27/2016 underwent bilateral diagnostic mammography with tomography [facility] The breast density was category B. The right breast was benign. In the left breast upper inner quadrant there was a large lobulated hyperdense mass which by exam was firm and nonmobile. By ultrasonography in the 11:30 o'clock radius 4 cm from the nipple there was a mass extending to the subcutaneous tissues and measuring 4.9 cm. There was a small superficial fluid collection overlying this consistent with a hematoma. The left axilla was benign.  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  The patient's subsequent history is as detailed below  INTERVAL HISTORY: [name] returns today for follow-up and treatment of her rapidly recurrent breast cancer. She is accompanied by her husband. She had been scheduled to start CMF chemotherapy, but I was called by Dr. Adair Laundry, her surgeon, 2 days ago telling me that she had dehiscence of her wound and that she still has drains in place.  He is not uncomfortable with her starting chemo but he just wanted to alert [place] about the possibility of postponing it.  REVIEW OF SYSTEMS: [name] is a little discouraged because she feels as if she takes one step forward and than two steps back. Otherwise, she is doing well and notes that her energy levels had been good. She has not been able to exercise regularly because of having the drains in place. However, she has been staying active by shopping a lot and walking. She denies unusual headaches, visual changes, nausea, vomiting, or dizziness. There has been no unusual cough, phlegm production, or pleurisy. This been no change in bowel or bladder habits. She denies unexplained fatigue or unexplained weight loss, bleeding, rash, or fever. A detailed review of systems was otherwise entirely stable.   PAST MEDICAL HISTORY: Past Medical History:  Diagnosis Date  . Anxiety   . CAP (community acquired pneumonia)   . Depression   . Malignant neoplasm of upper-inner quadrant of left female breast ([place]) 10/01/2016  . Personal history of chemotherapy   . Personal history of radiation therapy   . Tobacco use     PAST SURGICAL HISTORY: Past Surgical History:  Procedure Laterality Date  . ABDOMINAL HYSTERECTOMY  2011  Laparoscopic  . AUGMENTATION MAMMAPLASTY Bilateral 03/2017  . BREAST LUMPECTOMY Left 04/05/2017  . BREAST LUMPECTOMY WITH SENTINEL LYMPH NODE BIOPSY Left 04/07/2017  . BREAST REDUCTION SURGERY  Left 04/07/2017  . CHOLECYSTECTOMY    . open reduction left ankle    . SKIN GRAFT Left 06/19/2017   bil skin grafts to breasts    FAMILY HISTORY Family History  Problem Relation Age of Onset  . Heart disease Mother   . Heart disease Father   . Cancer Sister        ovarian ca  the patient's father died at the age of 25 from a myocardial infarction. The patient's mother died at the age of 60 with congestive heart failure. The patient had one brother, 2 sisters. One of her sisters was diagnosed with ovarian cancer at age 22 and died at age 62 from that disease  GYNECOLOGIC HISTORY:  No LMP recorded. Patient has had a hysterectomy. Menarche age 73, first live birth age 80. The patient is GX P1. She stopped having periods approxiately 2008. She did not take hormone replacement. She used oral contraceptives for approxi 30 years with no complications.  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 Her husband [name] is retired from [employer]. Their daughter [name] lives in [place] and is Mudlogger of the [employer] in [employer].     ADVANCED DIRECTIVES: not in place   HEALTH MAINTENANCE: Social History   Tobacco Use  . Smoking status: Former Smoker    Packs/day: 0.50    Years: 20.00    Pack years: 10.00    Last attempt to quit: 10/21/2016    Years since quitting: 0.9  . Smokeless tobacco: Never Used  Substance Use Topics  . Alcohol use: No  . Drug use: No     Colonoscopy:  PAP:  Bone density:   No Known Allergies  Current Outpatient Medications  Medication Sig Dispense Refill  . acetaminophen (TYLENOL) 325 MG tablet Take 650 mg by mouth every 6 (six) hours as needed for moderate pain.     Marland Kitchen ALPRAZolam (XANAX XR) 1 MG 24 hr tablet Take 1 tablet (1 mg total) by mouth daily. 30 tablet 1  . busPIRone (BUSPAR) 30 MG tablet Take 15 mg by mouth daily.     . cholestyramine (QUESTRAN) 4 g packet Take 1 packet (4 g total)  by mouth 3 (three) times daily with meals. 60 each 12  . dexamethasone (DECADRON) 4 MG tablet Take 2 tablets (8 mg total) by mouth daily. Start the day after chemotherapy for 2 days. Take with food. 30 tablet 1  . diphenhydrAMINE (BENADRYL) 25 MG tablet Take 25 mg by mouth as needed.    Marland Kitchen ibuprofen (ADVIL,MOTRIN) 200 MG tablet Take 800 mg by mouth every 6 (six) hours as needed for moderate pain.     Marland Kitchen letrozole (FEMARA) 2.5 MG tablet Take by mouth.    . lidocaine-prilocaine (EMLA) cream Apply to affected area once 30 g 3  . LORazepam (ATIVAN) 0.5 MG tablet Take 1 tablet (0.5 mg total) by mouth every 6 (six) hours as needed (Nausea or vomiting). 30 tablet 0  . nystatin (MYCOSTATIN) 100000 UNIT/ML suspension Take by mouth.    . ondansetron (ZOFRAN) 8 MG tablet Take 1 tablet (8 mg total) by mouth 2 (two) times daily as needed for refractory nausea / vomiting. Start on day 3 after chemotherapy. 30 tablet 1  .  PARoxetine (PAXIL) 40 MG tablet Take 40 mg by mouth every morning.    . prochlorperazine (COMPAZINE) 10 MG tablet Take 1 tablet (10 mg total) by mouth every 6 (six) hours as needed (Nausea or vomiting). 30 tablet 1  . ranitidine (ZANTAC) 150 MG tablet Take by mouth.    Marland Kitchen tiZANidine (ZANAFLEX) 4 MG tablet TAKE 1 TABLET BY MOUTH THREE TIMES A DAY     No current facility-administered medications for this visit.     OBJECTIVE: Middle-aged white woman in no acute distress  Vitals:   10/10/17 0844  BP: 126/74  Pulse: 91  Resp: 18  Temp: 98.4 F (36.9 C)  SpO2: 100%     Body mass index is 29.83 kg/m.   Filed Weights   10/10/17 0844  Weight: 202 lb (91.6 kg)     ECOG FS:1 - Symptomatic but completely ambulatory   Sclerae unicteric, EOMs intact Oropharynx clear and moist No cervical or supraclavicular adenopathy Lungs no rales or rhonchi Heart regular rate and rhythm Abd soft, nontender, positive bowel sounds MSK no focal spinal tenderness, no upper extremity lymphedema Neuro:  nonfocal, well oriented, appropriate affect Breasts: She is status post bilateral mastectomies.  The areas are bandaged over with the bandages clean and dry.  Drains in place on the left.  Both axillae are benign.  LAB RESULTS:  CMP     Component Value Date/Time   NA 139 09/19/2017 1001   K 4.5 09/19/2017 1001   CL 101 01/07/2016 0614   CO2 24 09/19/2017 1001   GLUCOSE 132 09/19/2017 1001   BUN 18.8 09/19/2017 1001   CREATININE 0.8 09/19/2017 1001   CALCIUM 9.3 09/19/2017 1001   PROT 7.0 09/19/2017 1001   ALBUMIN 4.0 09/19/2017 1001   AST 13 09/19/2017 1001   ALT 11 09/19/2017 1001   ALKPHOS 109 09/19/2017 1001   BILITOT 0.29 09/19/2017 1001   GFRNONAA >60 01/07/2016 0614   GFRAA >60 01/07/2016 0614    INo results found for: SPEP, UPEP  Lab Results  Component Value Date   WBC 7.0 10/10/2017   NEUTROABS 5.2 10/10/2017   HGB 12.8 10/10/2017   HCT 39.8 10/10/2017   MCV 84.1 10/10/2017   PLT 404 (H) 10/10/2017      Chemistry      Component Value Date/Time   NA 139 09/19/2017 1001   K 4.5 09/19/2017 1001   CL 101 01/07/2016 0614   CO2 24 09/19/2017 1001   BUN 18.8 09/19/2017 1001   CREATININE 0.8 09/19/2017 1001      Component Value Date/Time   CALCIUM 9.3 09/19/2017 1001   ALKPHOS 109 09/19/2017 1001   AST 13 09/19/2017 1001   ALT 11 09/19/2017 1001   BILITOT 0.29 09/19/2017 1001       No results found for: LABCA2  No components found for: LABCA125  No results for input(s): INR in the last 168 hours.  Urinalysis    Component Value Date/Time   COLORURINE ORANGE (A) 12/27/2015 1339   APPEARANCEUR CLOUDY (A) 12/27/2015 1339   LABSPEC 1.030 12/27/2015 1339   PHURINE 5.5 12/27/2015 1339   GLUCOSEU NEGATIVE 12/27/2015 1339   HGBUR NEGATIVE 12/27/2015 1339   BILIRUBINUR SMALL (A) 12/27/2015 1339   KETONESUR 15 (A) 12/27/2015 1339   PROTEINUR 100 (A) 12/27/2015 1339   NITRITE NEGATIVE 12/27/2015 1339   LEUKOCYTESUR TRACE (A) 12/27/2015 1339      STUDIES: CT Chest W Contrast, 08/15/2017, shows possible metastatic disease to left lower lobe pulmonary  nodule.  This will need of follow-up in 3-6 months  ELIGIBLE FOR AVAILABLE RESEARCH PROTOCOL: no  ASSESSMENT: 61 y.o. [employer], [place] woman status post left breast upper inner quadrant biopsy 09/30/2016 for a clinical T2 N0, stage II a invasive ductal carcinoma, grade 3, estrogen receptor positive, progesterone receptor negative, HER-2 not amplified, with an MIB-1 of 80%.  (1) genetics testing pending  (2) neoadjuvant chemotherapy given between 10/30/2016 and 03/12/2017 consisting of cyclophosphamide and doxorubicin in dose dense fashion 4  followed by paclitaxel 1, then Abraxane (x8?)  further treatments discontinued because of neuropathy.  (3) status post left lumpectomy with oncoplasty and right reduction mammoplasty showing a residual T2 N1 invasive ductal carcinoma, with lymphovascular invasion, but negative margins; the tumor is now HER-2 positive.  (a) postop PET scan showed no evidence of metastatic disease.  (4) adjuvant trastuzumab/Pertuzumab started 06/20/2017 (received at [facility]) started here on 07/11/2017--continue through August 2019  (a) Echo on 06/18/2017 at [facility]: LVEF 55-60%  (b) echocardiogram 09/26/2017 shows an ejection fraction in the 60-65% range.  (5) adjuvant radiation to follow  (6) anti-estrogens: Letrozole daily started 03/26/2017  (a) DEXA on 04/03/2017 demonstrated a T score of -1.3 in the right femoral neck  (b) Zometa given at [facility] on 06/20/2017  RECURRENCE: (7) Patient developed left inner breast nodule and underwent mammogram and ultrasound on 08/06/17 showing three areas of concern at 10 o'clock, 1130 o'clock, and at 8 o'clock. Biopsy on 08/07/2017 that demonstrated at 11:30 fat necrosis, however at 10:00 it was positive for IDC, grade 3, ER+(50%), PR-(0%), Ki-67 90%, HER-2 negative (ratio 1.48).  (a) staging chest CT 08/15/2017 shows 0.5 cm  left lower lobe lung nodule of uncertain significance  (8) status post left modified radical mastectomy and right simple mastectomy 08/25/2017 showing  (a) right breast, no evidence of malignancy  (b) left breast, ypT2 ypN1 invasive ductal carcinoma, grade 3, with negative margins; repeat prognostic panel: Triple negative  (9) to start adjuvant chemotherapy with cyclophosphamide/methotrexate/fluorouracil 11/10/2016  (a) will continue trastuzumab/Pertuzumab concurrently  (10) adjuvant radiation with cycles 3-5 of chemotherapy  (11) resume antiestrogens at the completion of chemotherapy   PLAN:   [name] has had a tortuous remote so far but I think we are going to be getting there.  Hopefully she will continue to heal as far as her chest wounds are concerned and can proceed to chemotherapy 10/17/2017.  She will then receive her first cycle of CMF together with trastuzumab and Pertuzumab.  I have asked her to keep a diary so she can tell us a week later when she sees me what the side effects were.  That will allow [place] to troubleshoot any problems.  She did not take any nausea medicine with her early chemo.  This regimen does not generally induce much nausea, and she does have dexamethasone and Compazine available.  I have suggested that she use at least the Compazine the days 2 and 3 after each treatment but she at least will use it on an as-needed basis.  She tells me that she feels a little bit nauseated just before coming here.  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  I have asked her to take an Ativan on the way here to see if we can interrupt that.  She maintains an excellent ejection fraction.  She will need her next echocardiogram in March.  She knows to call for any other issues that may develop before her next  visit      [name], [name], MD  10/10/17 9:20 AM Medical Oncology and Hematology [facility] [address] [place], [place] [address] Tel. [phone]    Fax. [phone]  This document serves as a record of services personally performed by Chauncey Cruel, MD. It was created on his behalf by Margit Banda, a trained medical scribe. The creation of this record is based on the scribe's personal observations and the provider's statements to them.   I have reviewed the above documentation for accuracy and completeness, and I agree with the above.

## 2017-10-15 ENCOUNTER — Telehealth: Payer: Self-pay | Admitting: Oncology

## 2017-10-15 NOTE — Telephone Encounter (Signed)
Spoke to patient regarding upcoming January appointments.  °

## 2017-10-17 ENCOUNTER — Ambulatory Visit (HOSPITAL_BASED_OUTPATIENT_CLINIC_OR_DEPARTMENT_OTHER): Payer: BLUE CROSS/BLUE SHIELD

## 2017-10-17 ENCOUNTER — Other Ambulatory Visit (HOSPITAL_BASED_OUTPATIENT_CLINIC_OR_DEPARTMENT_OTHER): Payer: BLUE CROSS/BLUE SHIELD

## 2017-10-17 VITALS — BP 138/76 | HR 73 | Temp 98.5°F | Resp 18

## 2017-10-17 DIAGNOSIS — C50212 Malignant neoplasm of upper-inner quadrant of left female breast: Secondary | ICD-10-CM

## 2017-10-17 DIAGNOSIS — Z5112 Encounter for antineoplastic immunotherapy: Secondary | ICD-10-CM | POA: Diagnosis not present

## 2017-10-17 DIAGNOSIS — C50912 Malignant neoplasm of unspecified site of left female breast: Secondary | ICD-10-CM

## 2017-10-17 DIAGNOSIS — Z170 Estrogen receptor positive status [ER+]: Principal | ICD-10-CM

## 2017-10-17 DIAGNOSIS — Z5111 Encounter for antineoplastic chemotherapy: Secondary | ICD-10-CM

## 2017-10-17 LAB — CBC WITH DIFFERENTIAL/PLATELET
BASO%: 1.4 % (ref 0.0–2.0)
Basophils Absolute: 0.1 10*3/uL (ref 0.0–0.1)
EOS%: 5.2 % (ref 0.0–7.0)
Eosinophils Absolute: 0.4 10*3/uL (ref 0.0–0.5)
HEMATOCRIT: 36.8 % (ref 34.8–46.6)
HEMOGLOBIN: 11.9 g/dL (ref 11.6–15.9)
LYMPH#: 1.4 10*3/uL (ref 0.9–3.3)
LYMPH%: 18.3 % (ref 14.0–49.7)
MCH: 27 pg (ref 25.1–34.0)
MCHC: 32.3 g/dL (ref 31.5–36.0)
MCV: 83.7 fL (ref 79.5–101.0)
MONO#: 0.4 10*3/uL (ref 0.1–0.9)
MONO%: 5 % (ref 0.0–14.0)
NEUT#: 5.2 10*3/uL (ref 1.5–6.5)
NEUT%: 70.1 % (ref 38.4–76.8)
PLATELETS: 374 10*3/uL (ref 145–400)
RBC: 4.39 10*6/uL (ref 3.70–5.45)
RDW: 15.1 % — AB (ref 11.2–14.5)
WBC: 7.5 10*3/uL (ref 3.9–10.3)

## 2017-10-17 LAB — COMPREHENSIVE METABOLIC PANEL
ALBUMIN: 3.5 g/dL (ref 3.5–5.0)
ALK PHOS: 103 U/L (ref 40–150)
ALT: 10 U/L (ref 0–55)
AST: 12 U/L (ref 5–34)
Anion Gap: 9 mEq/L (ref 3–11)
BUN: 12 mg/dL (ref 7.0–26.0)
CALCIUM: 9.2 mg/dL (ref 8.4–10.4)
CO2: 25 mEq/L (ref 22–29)
CREATININE: 0.8 mg/dL (ref 0.6–1.1)
Chloride: 106 mEq/L (ref 98–109)
EGFR: >60 mL/min/{1.73_m2} (ref >60)
Glucose: 122 mg/dl (ref 70–140)
Potassium: 4.1 mEq/L (ref 3.5–5.1)
Sodium: 140 mEq/L (ref 136–145)
TOTAL PROTEIN: 7.1 g/dL (ref 6.4–8.3)
Total Bilirubin: 0.23 mg/dL (ref 0.20–1.20)

## 2017-10-17 MED ORDER — ACETAMINOPHEN 325 MG PO TABS
650.0000 mg | ORAL_TABLET | Freq: Once | ORAL | Status: AC
Start: 2017-10-17 — End: 2017-10-17
  Administered 2017-10-17: 650 mg via ORAL

## 2017-10-17 MED ORDER — DEXAMETHASONE SODIUM PHOSPHATE 10 MG/ML IJ SOLN
INTRAMUSCULAR | Status: AC
  Filled 2017-10-17: qty 1

## 2017-10-17 MED ORDER — PALONOSETRON HCL INJECTION 0.25 MG/5ML
0.2500 mg | Freq: Once | INTRAVENOUS | Status: AC
  Administered 2017-10-17: 0.25 mg via INTRAVENOUS

## 2017-10-17 MED ORDER — SODIUM CHLORIDE 0.9 % IV SOLN
420.0000 mg | Freq: Once | INTRAVENOUS | Status: AC
  Administered 2017-10-17: 420 mg via INTRAVENOUS
  Filled 2017-10-17: qty 14

## 2017-10-17 MED ORDER — METHOTREXATE SODIUM (PF) CHEMO INJECTION 250 MG/10ML
40.1000 mg/m2 | Freq: Once | INTRAMUSCULAR | Status: AC
  Administered 2017-10-17: 85 mg via INTRAVENOUS
  Filled 2017-10-17: qty 3.4

## 2017-10-17 MED ORDER — TRASTUZUMAB CHEMO 150 MG IV SOLR
6.0000 mg/kg | Freq: Once | INTRAVENOUS | Status: AC
  Administered 2017-10-17: 567 mg via INTRAVENOUS
  Filled 2017-10-17: qty 27

## 2017-10-17 MED ORDER — DIPHENHYDRAMINE HCL 25 MG PO CAPS
ORAL_CAPSULE | ORAL | Status: AC
  Filled 2017-10-17: qty 1

## 2017-10-17 MED ORDER — DEXAMETHASONE SODIUM PHOSPHATE 10 MG/ML IJ SOLN
10.0000 mg | Freq: Once | INTRAMUSCULAR | Status: AC
  Administered 2017-10-17: 10 mg via INTRAVENOUS

## 2017-10-17 MED ORDER — SODIUM CHLORIDE 0.9% FLUSH
10.0000 mL | INTRAVENOUS | Status: DC | PRN
  Administered 2017-10-17: 10 mL
  Filled 2017-10-17: qty 10

## 2017-10-17 MED ORDER — HEPARIN SOD (PORK) LOCK FLUSH 100 UNIT/ML IV SOLN
500.0000 [IU] | Freq: Once | INTRAVENOUS | Status: AC | PRN
  Administered 2017-10-17: 500 [IU]
  Filled 2017-10-17: qty 5

## 2017-10-17 MED ORDER — DIPHENHYDRAMINE HCL 25 MG PO CAPS
25.0000 mg | ORAL_CAPSULE | Freq: Once | ORAL | Status: AC
  Administered 2017-10-17: 25 mg via ORAL

## 2017-10-17 MED ORDER — PALONOSETRON HCL INJECTION 0.25 MG/5ML
INTRAVENOUS | Status: AC
  Filled 2017-10-17: qty 5

## 2017-10-17 MED ORDER — ACETAMINOPHEN 325 MG PO TABS
ORAL_TABLET | ORAL | Status: AC
  Filled 2017-10-17: qty 2

## 2017-10-17 MED ORDER — SODIUM CHLORIDE 0.9 % IV SOLN
Freq: Once | INTRAVENOUS | Status: AC
  Administered 2017-10-17: 14:00:00 via INTRAVENOUS

## 2017-10-17 MED ORDER — FLUOROURACIL CHEMO INJECTION 2.5 GM/50ML
600.0000 mg/m2 | Freq: Once | INTRAVENOUS | Status: AC
  Administered 2017-10-17: 1250 mg via INTRAVENOUS
  Filled 2017-10-17: qty 25

## 2017-10-17 MED ORDER — CYCLOPHOSPHAMIDE CHEMO INJECTION 1 GM
600.0000 mg/m2 | Freq: Once | INTRAMUSCULAR | Status: AC
  Administered 2017-10-17: 1280 mg via INTRAVENOUS
  Filled 2017-10-17: qty 64

## 2017-10-17 NOTE — Patient Instructions (Signed)
[facility] Discharge Instructions for Patients Receiving Chemotherapy  Today you received the following chemotherapy agents: Herceptin, Perjeta, Cytoxan, Methotrexate, 5FU (Fluorouracil)  To help prevent nausea and vomiting after your treatment, we encourage you to take your nausea medication as prescribed.   If you develop nausea and vomiting that is not controlled by your nausea medication, call the clinic.   BELOW ARE SYMPTOMS THAT SHOULD BE REPORTED IMMEDIATELY:  *FEVER GREATER THAN 100.5 F  *CHILLS WITH OR WITHOUT FEVER  NAUSEA AND VOMITING THAT IS NOT CONTROLLED WITH YOUR NAUSEA MEDICATION  *UNUSUAL SHORTNESS OF BREATH  *UNUSUAL BRUISING OR BLEEDING  TENDERNESS IN MOUTH AND THROAT WITH OR WITHOUT PRESENCE OF ULCERS  *URINARY PROBLEMS  *BOWEL PROBLEMS  UNUSUAL RASH Items with * indicate a potential emergency and should be followed up as soon as possible.  Feel free to call the clinic should you have any questions or concerns. The clinic phone number is [phone].  Please show the [facility] at check-in to the Emergency Department and triage nurse.    Cyclophosphamide injection What is this medicine? CYCLOPHOSPHAMIDE (sye kloe FOSS fa mide) is a chemotherapy drug. It slows the growth of cancer cells. This medicine is used to treat many types of cancer like lymphoma, myeloma, leukemia, breast cancer, and ovarian cancer, to name a few. This medicine may be used for other purposes; ask your health care provider or pharmacist if you have questions. COMMON BRAND NAME(S): Cytoxan, Neosar What should I tell my health care provider before I take this medicine? They need to know if you have any of these conditions: -blood disorders -history of other chemotherapy -infection -kidney disease -liver disease -recent or ongoing radiation therapy -tumors in the bone marrow -an unusual or allergic reaction to cyclophosphamide, other chemotherapy, other  medicines, foods, dyes, or preservatives -pregnant or trying to get pregnant -breast-feeding How should I use this medicine? This drug is usually given as an injection into a vein or muscle or by infusion into a vein. It is administered in a hospital or clinic by a specially trained health care professional. Talk to your pediatrician regarding the use of this medicine in children. Special care may be needed. Overdosage: If you think you have taken too much of this medicine contact a poison control center or emergency room at once. NOTE: This medicine is only for you. Do not share this medicine with others. What if I miss a dose? It is important not to miss your dose. Call your doctor or health care professional if you are unable to keep an appointment. What may interact with this medicine? This medicine may interact with the following medications: -amiodarone -amphotericin B -azathioprine -certain antiviral medicines for HIV or AIDS such as protease inhibitors (e.g., indinavir, ritonavir) and zidovudine -certain blood pressure medications such as benazepril, captopril, enalapril, fosinopril, lisinopril, moexipril, monopril, perindopril, quinapril, ramipril, trandolapril -certain cancer medications such as anthracyclines (e.g., daunorubicin, doxorubicin), busulfan, cytarabine, paclitaxel, pentostatin, tamoxifen, trastuzumab -certain diuretics such as chlorothiazide, chlorthalidone, hydrochlorothiazide, indapamide, metolazone -certain medicines that treat or prevent blood clots like warfarin -certain muscle relaxants such as succinylcholine -cyclosporine -etanercept -indomethacin -medicines to increase blood counts like filgrastim, pegfilgrastim, sargramostim -medicines used as general anesthesia -metronidazole -natalizumab This list may not describe all possible interactions. Give your health care provider a list of all the medicines, herbs, non-prescription drugs, or dietary supplements  you use. Also tell them if you smoke, drink alcohol, or use illegal drugs. Some items may interact with your  medicine. What should I watch for while using this medicine? Visit your doctor for checks on your progress. This drug may make you feel generally unwell. This is not uncommon, as chemotherapy can affect healthy cells as well as cancer cells. Report any side effects. Continue your course of treatment even though you feel ill unless your doctor tells you to stop. Drink water or other fluids as directed. Urinate often, even at night. In some cases, you may be given additional medicines to help with side effects. Follow all directions for their use. Call your doctor or health care professional for advice if you get a fever, chills or sore throat, or other symptoms of a cold or flu. Do not treat yourself. This drug decreases your body's ability to fight infections. Try to avoid being around people who are sick. This medicine may increase your risk to bruise or bleed. Call your doctor or health care professional if you notice any unusual bleeding. Be careful brushing and flossing your teeth or using a toothpick because you may get an infection or bleed more easily. If you have any dental work done, tell your dentist you are receiving this medicine. You may get drowsy or dizzy. Do not drive, use machinery, or do anything that needs mental alertness until you know how this medicine affects you. Do not become pregnant while taking this medicine or for 1 year after stopping it. Women should inform their doctor if they wish to become pregnant or think they might be pregnant. Men should not father a child while taking this medicine and for 4 months after stopping it. There is a potential for serious side effects to an unborn child. Talk to your health care professional or pharmacist for more information. Do not breast-feed an infant while taking this medicine. This medicine may interfere with the ability to  have a child. This medicine has caused ovarian failure in some women. This medicine has caused reduced sperm counts in some men. You should talk with your doctor or health care professional if you are concerned about your fertility. If you are going to have surgery, tell your doctor or health care professional that you have taken this medicine. What side effects may I notice from receiving this medicine? Side effects that you should report to your doctor or health care professional as soon as possible: -allergic reactions like skin rash, itching or hives, swelling of the face, lips, or tongue -low blood counts - this medicine may decrease the number of white blood cells, red blood cells and platelets. You may be at increased risk for infections and bleeding. -signs of infection - fever or chills, cough, sore throat, pain or difficulty passing urine -signs of decreased platelets or bleeding - bruising, pinpoint red spots on the skin, black, tarry stools, blood in the urine -signs of decreased red blood cells - unusually weak or tired, fainting spells, lightheadedness -breathing problems -dark urine -dizziness -palpitations -swelling of the ankles, feet, hands -trouble passing urine or change in the amount of urine -weight gain -yellowing of the eyes or skin Side effects that usually do not require medical attention (report to your doctor or health care professional if they continue or are bothersome): -changes in nail or skin color -hair loss -missed menstrual periods -mouth sores -nausea, vomiting This list may not describe all possible side effects. Call your doctor for medical advice about side effects. You may report side effects to FDA at 1-800-FDA-1088. Where should I keep my medicine? This drug  is given in a hospital or clinic and will not be stored at home. NOTE: This sheet is a summary. It may not cover all possible information. If you have questions about this medicine, talk to  your doctor, pharmacist, or health care provider.  2018 [employer] Standard (2012-08-21 16:22:58)   Methotrexate injection What is this medicine? METHOTREXATE (METH oh TREX ate) is a chemotherapy drug used to treat cancer including breast cancer, leukemia, and lymphoma. This medicine can also be used to treat psoriasis and certain kinds of arthritis. This medicine may be used for other purposes; ask your health care provider or pharmacist if you have questions. What should I tell my health care provider before I take this medicine? They need to know if you have any of these conditions: -fluid in the stomach area or lungs -if you often drink alcohol -infection or immune system problems -kidney disease -liver disease -low blood counts, like low white cell, platelet, or red cell counts -lung disease -radiation therapy -stomach ulcers -ulcerative colitis -an unusual or allergic reaction to methotrexate, other medicines, foods, dyes, or preservatives -pregnant or trying to get pregnant -breast-feeding How should I use this medicine? This medicine is for infusion into a vein or for injection into muscle or into the spinal fluid (whichever applies). It is usually given by a health care professional in a hospital or clinic setting. In rare cases, you might get this medicine at home. You will be taught how to give this medicine. Use exactly as directed. Take your medicine at regular intervals. Do not take your medicine more often than directed. If this medicine is used for arthritis or psoriasis, it should be taken weekly, NOT daily. It is important that you put your used needles and syringes in a special sharps container. Do not put them in a trash can. If you do not have a sharps container, call your pharmacist or healthcare provider to get one. Talk to your pediatrician regarding the use of this medicine in children. While this drug may be prescribed for children as young as 2 years for  selected conditions, precautions do apply. Overdosage: If you think you have taken too much of this medicine contact a poison control center or emergency room at once. NOTE: This medicine is only for you. Do not share this medicine with others. What if I miss a dose? It is important not to miss your dose. Call your doctor or health care professional if you are unable to keep an appointment. If you give yourself the medicine and you miss a dose, talk with your doctor or health care professional. Do not take double or extra doses. What may interact with this medicine? This medicine may interact with the following medications: -acitretin -aspirin or aspirin-like medicines including salicylates -azathioprine -certain antibiotics like chloramphenicol, penicillin, tetracycline -certain medicines for stomach problems like esomeprazole, omeprazole, pantoprazole -cyclosporine -gold -hydroxychloroquine -live virus vaccines -mercaptopurine -NSAIDs, medicines for pain and inflammation, like ibuprofen or naproxen -other cytotoxic agents -penicillamine -phenylbutazone -phenytoin -probenacid -retinoids such as isotretinoin and tretinoin -steroid medicines like prednisone or cortisone -sulfonamides like sulfasalazine and trimethoprim/sulfamethoxazole -theophylline This list may not describe all possible interactions. Give your health care provider a list of all the medicines, herbs, non-prescription drugs, or dietary supplements you use. Also tell them if you smoke, drink alcohol, or use illegal drugs. Some items may interact with your medicine. What should I watch for while using this medicine? Avoid alcoholic drinks. In some cases, you may be given additional medicines  to help with side effects. Follow all directions for their use. This medicine can make you more sensitive to the sun. Keep out of the sun. If you cannot avoid being in the sun, wear protective clothing and use sunscreen. Do not use  sun lamps or tanning beds/booths. You may get drowsy or dizzy. Do not drive, use machinery, or do anything that needs mental alertness until you know how this medicine affects you. Do not stand or sit up quickly, especially if you are an older patient. This reduces the risk of dizzy or fainting spells. You may need blood work done while you are taking this medicine. Call your doctor or health care professional for advice if you get a fever, chills or sore throat, or other symptoms of a cold or flu. Do not treat yourself. This drug decreases your body's ability to fight infections. Try to avoid being around people who are sick. This medicine may increase your risk to bruise or bleed. Call your doctor or health care professional if you notice any unusual bleeding. Check with your doctor or health care professional if you get an attack of severe diarrhea, nausea and vomiting, or if you sweat a lot. The loss of too much body fluid can make it dangerous for you to take this medicine. Talk to your doctor about your risk of cancer. You may be more at risk for certain types of cancers if you take this medicine. Both men and women must use effective birth control with this medicine. Do not become pregnant while taking this medicine or until at least 1 normal menstrual cycle has occurred after stopping it. Women should inform their doctor if they wish to become pregnant or think they might be pregnant. Men should not father a child while taking this medicine and for 3 months after stopping it. There is a potential for serious side effects to an unborn child. Talk to your health care professional or pharmacist for more information. Do not breast-feed an infant while taking this medicine. What side effects may I notice from receiving this medicine? Side effects that you should report to your doctor or health care professional as soon as possible: -allergic reactions like skin rash, itching or hives, swelling of the  face, lips, or tongue -back pain -breathing problems or shortness of breath -confusion -diarrhea -dry, nonproductive cough -low blood counts - this medicine may decrease the number of white blood cells, red blood cells and platelets. You may be at increased risk of infections and bleeding -mouth sores -redness, blistering, peeling or loosening of the skin, including inside the mouth -seizures -severe headaches -signs of infection - fever or chills, cough, sore throat, pain or difficulty passing urine -signs and symptoms of bleeding such as bloody or black, tarry stools; red or dark-brown urine; spitting up blood or brown material that looks like coffee grounds; red spots on the skin; unusual bruising or bleeding from the eye, gums, or nose -signs and symptoms of kidney injury like trouble passing urine or change in the amount of urine -signs and symptoms of liver injury like dark yellow or brown urine; general ill feeling or flu-like symptoms; light-colored stools; loss of appetite; nausea; right upper belly pain; unusually weak or tired; yellowing of the eyes or skin -stiff neck -vomiting Side effects that usually do not require medical attention (report to your doctor or health care professional if they continue or are bothersome): -dizziness -hair loss -headache -stomach pain -upset stomach This list may not describe  all possible side effects. Call your doctor for medical advice about side effects. You may report side effects to FDA at 1-800-FDA-1088. Where should I keep my medicine? If you are using this medicine at home, you will be instructed on how to store this medicine. Throw away any unused medicine after the expiration date on the label. NOTE: This sheet is a summary. It may not cover all possible information. If you have questions about this medicine, talk to your doctor, pharmacist, or health care provider.  2018 [employer] Standard (2015-01-26  12:36:41)    Fluorouracil, 5-FU injection What is this medicine? FLUOROURACIL, 5-FU (flure oh YOOR a sil) is a chemotherapy drug. It slows the growth of cancer cells. This medicine is used to treat many types of cancer like breast cancer, colon or rectal cancer, pancreatic cancer, and stomach cancer. This medicine may be used for other purposes; ask your health care provider or pharmacist if you have questions. COMMON BRAND NAME(S): Adrucil What should I tell my health care provider before I take this medicine? They need to know if you have any of these conditions: -blood disorders -dihydropyrimidine dehydrogenase (DPD) deficiency -infection (especially a virus infection such as chickenpox, cold sores, or herpes) -kidney disease -liver disease -malnourished, poor nutrition -recent or ongoing radiation therapy -an unusual or allergic reaction to fluorouracil, other chemotherapy, other medicines, foods, dyes, or preservatives -pregnant or trying to get pregnant -breast-feeding How should I use this medicine? This drug is given as an infusion or injection into a vein. It is administered in a hospital or clinic by a specially trained health care professional. Talk to your pediatrician regarding the use of this medicine in children. Special care may be needed. Overdosage: If you think you have taken too much of this medicine contact a poison control center or emergency room at once. NOTE: This medicine is only for you. Do not share this medicine with others. What if I miss a dose? It is important not to miss your dose. Call your doctor or health care professional if you are unable to keep an appointment. What may interact with this medicine? -allopurinol -cimetidine -dapsone -digoxin -hydroxyurea -leucovorin -levamisole -medicines for seizures like ethotoin, fosphenytoin, phenytoin -medicines to increase blood counts like filgrastim, pegfilgrastim, sargramostim -medicines that treat  or prevent blood clots like warfarin, enoxaparin, and dalteparin -methotrexate -metronidazole -pyrimethamine -some other chemotherapy drugs like busulfan, cisplatin, estramustine, vinblastine -trimethoprim -trimetrexate -vaccines Talk to your doctor or health care professional before taking any of these medicines: -acetaminophen -aspirin -ibuprofen -ketoprofen -naproxen This list may not describe all possible interactions. Give your health care provider a list of all the medicines, herbs, non-prescription drugs, or dietary supplements you use. Also tell them if you smoke, drink alcohol, or use illegal drugs. Some items may interact with your medicine. What should I watch for while using this medicine? Visit your doctor for checks on your progress. This drug may make you feel generally unwell. This is not uncommon, as chemotherapy can affect healthy cells as well as cancer cells. Report any side effects. Continue your course of treatment even though you feel ill unless your doctor tells you to stop. In some cases, you may be given additional medicines to help with side effects. Follow all directions for their use. Call your doctor or health care professional for advice if you get a fever, chills or sore throat, or other symptoms of a cold or flu. Do not treat yourself. This drug decreases your body's ability to fight infections.  Try to avoid being around people who are sick. This medicine may increase your risk to bruise or bleed. Call your doctor or health care professional if you notice any unusual bleeding. Be careful brushing and flossing your teeth or using a toothpick because you may get an infection or bleed more easily. If you have any dental work done, tell your dentist you are receiving this medicine. Avoid taking products that contain aspirin, acetaminophen, ibuprofen, naproxen, or ketoprofen unless instructed by your doctor. These medicines may hide a fever. Do not become pregnant  while taking this medicine. Women should inform their doctor if they wish to become pregnant or think they might be pregnant. There is a potential for serious side effects to an unborn child. Talk to your health care professional or pharmacist for more information. Do not breast-feed an infant while taking this medicine. Men should inform their doctor if they wish to father a child. This medicine may lower sperm counts. Do not treat diarrhea with over the counter products. Contact your doctor if you have diarrhea that lasts more than 2 days or if it is severe and watery. This medicine can make you more sensitive to the sun. Keep out of the sun. If you cannot avoid being in the sun, wear protective clothing and use sunscreen. Do not use sun lamps or tanning beds/booths. What side effects may I notice from receiving this medicine? Side effects that you should report to your doctor or health care professional as soon as possible: -allergic reactions like skin rash, itching or hives, swelling of the face, lips, or tongue -low blood counts - this medicine may decrease the number of white blood cells, red blood cells and platelets. You may be at increased risk for infections and bleeding. -signs of infection - fever or chills, cough, sore throat, pain or difficulty passing urine -signs of decreased platelets or bleeding - bruising, pinpoint red spots on the skin, black, tarry stools, blood in the urine -signs of decreased red blood cells - unusually weak or tired, fainting spells, lightheadedness -breathing problems -changes in vision -chest pain -mouth sores -nausea and vomiting -pain, swelling, redness at site where injected -pain, tingling, numbness in the hands or feet -redness, swelling, or sores on hands or feet -stomach pain -unusual bleeding Side effects that usually do not require medical attention (report to your doctor or health care professional if they continue or are  bothersome): -changes in finger or toe nails -diarrhea -dry or itchy skin -hair loss -headache -loss of appetite -sensitivity of eyes to the light -stomach upset -unusually teary eyes This list may not describe all possible side effects. Call your doctor for medical advice about side effects. You may report side effects to FDA at 1-800-FDA-1088. Where should I keep my medicine? This drug is given in a hospital or clinic and will not be stored at home. NOTE: This sheet is a summary. It may not cover all possible information. If you have questions about this medicine, talk to your doctor, pharmacist, or health care provider.  2018 [employer] Standard (2008-02-10 13:53:16)

## 2017-10-22 NOTE — Progress Notes (Signed)
[name]  [phone] [phone]     ID: [name] DOB: 04/19/1956  MR#: [id number]  HUT#:[id number]  Patient Care Team: Chesley Noon, MD as PCP - General (Family Medicine) Excell Seltzer, MD as Consulting Physician (General Surgery) Malene Blaydes, [name], MD as Consulting Physician (Oncology) Gery Pray, MD as Consulting Physician (Radiation Oncology) Collene Gobble, MD as Consulting Physician (Pulmonary Disease) Verdell Carmine, MD as Referring Physician (Internal Medicine) Larey Dresser, MD as Consulting Physician (Cardiology) Beverely Pace, MD as Referring Physician (Surgical Oncology) OTHER MD:  CHIEF COMPLAINT: estrogen receptor positive breast cancer  CURRENT TREATMENT: Adjuvant Trastuzumab/ Pertuzumab and CMF chemotherapy   BREAST CANCER HISTORY: From the original intake note:  "[name]" had a fall while traveling living to [place] in their RV and injure her left upper arm and  her left breast . Approximately 3 weeks later she noted a mass in the left breast upper inner quadrant. It was somewhat tender. She brought this to medical attention and on 09/27/2016 underwent bilateral diagnostic mammography with tomography [facility] The breast density was category B. The right breast was benign. In the left breast upper inner quadrant there was a large lobulated hyperdense mass which by exam was firm and nonmobile. By ultrasonography in the 11:30 o'clock radius 4 cm from the nipple there was a mass extending to the subcutaneous tissues and measuring 4.9 cm. There was a small superficial fluid collection overlying this consistent with a hematoma. The left axilla was benign.  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  The patient's subsequent history is as detailed below  INTERVAL HISTORY: [name] returns today for follow-up and treatment of her estrogen receptor positive breast cancer accompanied by her husband. She received her first cycle of CMF chemotherapy 10/17/2017 She reports that chemo went well other than having to stay longer than she anticipated. She reports that she took compazine the 1st night for nausea, but she hasn't been nauseous since then. She notes having a headache for the 1st two days after treatment in which she took ibuprofen to aid. She denies rash or sores in her mouth. She notes some slight constipation.    REVIEW OF SYSTEMS: [name] reports that she has been walking for exercise. She notes that her legs have been hurting so she hasn't been walking as much as she'd like to. She denies any swelling in her legs. She denies unusual headaches, visual changes, nausea, vomiting, or dizziness. There has been no unusual cough, phlegm production, or pleurisy. This been no change in bowel or bladder habits. She denies unexplained fatigue or unexplained weight loss, bleeding, rash, or fever. A detailed review of systems was otherwise stable.    PAST MEDICAL HISTORY: Past Medical History:  Diagnosis Date  . Anxiety   . CAP (community acquired pneumonia)   . Depression   . Malignant neoplasm of upper-inner quadrant of left female breast ([place]) 10/01/2016  . Personal history of chemotherapy   . Personal history of radiation therapy   . Tobacco use     PAST SURGICAL HISTORY: Past Surgical History:  Procedure Laterality Date  . ABDOMINAL HYSTERECTOMY  2011   Laparoscopic  . AUGMENTATION MAMMAPLASTY Bilateral 03/2017  . BREAST  LUMPECTOMY Left 04/05/2017  . BREAST LUMPECTOMY WITH SENTINEL LYMPH NODE BIOPSY Left 04/07/2017  . BREAST REDUCTION SURGERY Left 04/07/2017  . CHOLECYSTECTOMY    .  open reduction left ankle    . SKIN GRAFT Left 06/19/2017   bil skin grafts to breasts    FAMILY HISTORY Family History  Problem Relation Age of Onset  . Heart disease Mother   . Heart disease Father   . Cancer Sister        ovarian ca  the patient's father died at the age of 10 from a myocardial infarction. The patient's mother died at the age of 47 with congestive heart failure. The patient had one brother, 2 sisters. One of her sisters was diagnosed with ovarian cancer at age 62 and died at age 58 from that disease  GYNECOLOGIC HISTORY:  No LMP recorded. Patient has had a hysterectomy. Menarche age 49, first live birth age 2. The patient is GX P1. She stopped having periods approxiately 2008. She did not take hormone replacement. She used oral contraceptives for approxi 30 years with no complications.  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 Her husband [name] is retired from [employer]. Their daughter [name] lives in [place] and is Mudlogger of the [employer] in [employer].     ADVANCED DIRECTIVES: not in place   HEALTH MAINTENANCE: Social History   Tobacco Use  . Smoking status: Former Smoker    Packs/day: 0.50    Years: 20.00    Pack years: 10.00    Last attempt to quit: 10/21/2016    Years since quitting: 1.0  . Smokeless tobacco: Never Used  Substance Use Topics  . Alcohol use: No  . Drug use: No     Colonoscopy:  PAP:  Bone density:   No Known Allergies  Current Outpatient Medications  Medication Sig Dispense Refill  . acetaminophen (TYLENOL) 325 MG tablet Take 650 mg by mouth every 6 (six) hours as needed for moderate pain.     Marland Kitchen ALPRAZolam (XANAX XR) 1 MG 24 hr tablet Take 1 tablet (1 mg total) by mouth daily. 30 tablet 1  . busPIRone (BUSPAR) 30 MG tablet Take 15 mg by mouth daily.     . cholestyramine (QUESTRAN) 4 g packet Take 1 packet (4 g total) by mouth 3 (three) times daily with meals.  60 each 12  . dexamethasone (DECADRON) 4 MG tablet Take 2 tablets (8 mg total) by mouth daily. Start the day after chemotherapy for 2 days. Take with food. 30 tablet 1  . diphenhydrAMINE (BENADRYL) 25 MG tablet Take 25 mg by mouth as needed.    Marland Kitchen ibuprofen (ADVIL,MOTRIN) 200 MG tablet Take 800 mg by mouth every 6 (six) hours as needed for moderate pain.     Marland Kitchen letrozole (FEMARA) 2.5 MG tablet Take by mouth.    . lidocaine-prilocaine (EMLA) cream Apply to affected area once 30 g 3  . LORazepam (ATIVAN) 0.5 MG tablet Take 1 tablet (0.5 mg total) by mouth every 6 (six) hours as needed (Nausea or vomiting). 30 tablet 0  . nystatin (MYCOSTATIN) 100000 UNIT/ML suspension Take by mouth.    . ondansetron (ZOFRAN) 8 MG tablet Take 1 tablet (8 mg total) by mouth 2 (two) times daily as needed for refractory nausea / vomiting. Start on day 3 after chemotherapy. 30 tablet 1  . PARoxetine (PAXIL) 40 MG tablet Take 40 mg by mouth  every morning.    . prochlorperazine (COMPAZINE) 10 MG tablet Take 1 tablet (10 mg total) by mouth every 6 (six) hours as needed (Nausea or vomiting). 30 tablet 1  . ranitidine (ZANTAC) 150 MG tablet Take by mouth.    Marland Kitchen tiZANidine (ZANAFLEX) 4 MG tablet TAKE 1 TABLET BY MOUTH THREE TIMES A DAY     No current facility-administered medications for this visit.     OBJECTIVE: Middle-aged white woman who appears stated age  2:   10/24/17 1325  BP: (!) 150/84  Pulse: 99  Resp: 18  Temp: 98.7 F (37.1 C)  SpO2: 100%     Body mass index is 29.77 kg/m.   Filed Weights   10/24/17 1325  Weight: 201 lb 9.6 oz (91.4 kg)     ECOG FS:1 - Symptomatic but completely ambulatory   Sclerae unicteric, EOMs intact Oropharynx clear and moist No cervical or supraclavicular adenopathy Lungs no rales or rhonchi Heart regular rate and rhythm Abd soft, nontender, positive bowel sounds MSK no focal spinal tenderness, no upper extremity lymphedema Neuro: nonfocal, well oriented,  appropriate affect Breasts: Status post bilateral mastectomies.  There is a minimal area of dehiscence on the right side, very shallow, measuring approximately two thirds of a centimeter by a third of a centimeter, with a yellow base.  There is some redness along the left incision but no evidence of infection.  Both axillae are benign  LAB RESULTS:  CMP     Component Value Date/Time   NA 140 10/17/2017 1255   K 4.1 10/17/2017 1255   CL 101 01/07/2016 0614   CO2 25 10/17/2017 1255   GLUCOSE 122 10/17/2017 1255   BUN 12.0 10/17/2017 1255   CREATININE 0.8 10/17/2017 1255   CALCIUM 9.2 10/17/2017 1255   PROT 7.1 10/17/2017 1255   ALBUMIN 3.5 10/17/2017 1255   AST 12 10/17/2017 1255   ALT 10 10/17/2017 1255   ALKPHOS 103 10/17/2017 1255   BILITOT 0.23 10/17/2017 1255   GFRNONAA >60 01/07/2016 0614   GFRAA >60 01/07/2016 0614    INo results found for: SPEP, UPEP  Lab Results  Component Value Date   WBC 6.4 10/24/2017   NEUTROABS 4.1 10/24/2017   HGB 12.7 10/24/2017   HCT 40.2 10/24/2017   MCV 84.0 10/24/2017   PLT 292 10/24/2017      Chemistry      Component Value Date/Time   NA 140 10/17/2017 1255   K 4.1 10/17/2017 1255   CL 101 01/07/2016 0614   CO2 25 10/17/2017 1255   BUN 12.0 10/17/2017 1255   CREATININE 0.8 10/17/2017 1255      Component Value Date/Time   CALCIUM 9.2 10/17/2017 1255   ALKPHOS 103 10/17/2017 1255   AST 12 10/17/2017 1255   ALT 10 10/17/2017 1255   BILITOT 0.23 10/17/2017 1255       No results found for: LABCA2  No components found for: LABCA125  No results for input(s): INR in the last 168 hours.  Urinalysis    Component Value Date/Time   COLORURINE ORANGE (A) 12/27/2015 1339   APPEARANCEUR CLOUDY (A) 12/27/2015 1339   LABSPEC 1.030 12/27/2015 1339   PHURINE 5.5 12/27/2015 1339   GLUCOSEU NEGATIVE 12/27/2015 1339   HGBUR NEGATIVE 12/27/2015 1339   BILIRUBINUR SMALL (A) 12/27/2015 1339   KETONESUR 15 (A) 12/27/2015 1339    PROTEINUR 100 (A) 12/27/2015 1339   NITRITE NEGATIVE 12/27/2015 1339   LEUKOCYTESUR TRACE (A) 12/27/2015 1339  STUDIES: CT Chest W Contrast, 08/15/2017, shows possible metastatic disease to left lower lobe pulmonary nodule.  This will need of follow-up in 3-6 months  ELIGIBLE FOR AVAILABLE RESEARCH PROTOCOL: no  ASSESSMENT: 62 y.o. [employer], [place] woman status post left breast upper inner quadrant biopsy 09/30/2016 for a clinical T2 N0, stage II a invasive ductal carcinoma, grade 3, estrogen receptor positive, progesterone receptor negative, HER-2 not amplified, with an MIB-1 of 80%.  (1) genetics testing pending  (2) neoadjuvant chemotherapy given between 10/30/2016 and 03/12/2017 consisting of cyclophosphamide and doxorubicin in dose dense fashion 4  followed by paclitaxel 1, then Abraxane (x8?)  further treatments discontinued because of neuropathy.  (3) status post left lumpectomy with oncoplasty and right reduction mammoplasty showing a residual T2 N1 invasive ductal carcinoma, with lymphovascular invasion, but negative margins; the tumor is now HER-2 positive.  (a) postop PET scan showed no evidence of metastatic disease.  (4) adjuvant trastuzumab/Pertuzumab started 06/20/2017 (received at [facility]) started here on 07/11/2017--continue through August 2019  (a) Echo on 06/18/2017 at [facility]: LVEF 55-60%  (b) echocardiogram 09/26/2017 shows an ejection fraction in the 60-65% range.  (5) adjuvant radiation to follow  (6) anti-estrogens: Letrozole daily started 03/26/2017  (a) DEXA on 04/03/2017 demonstrated a T score of -1.3 in the right femoral neck  (b) Zometa [facility] on 06/20/2017  RECURRENCE: (7) Patient developed left inner breast nodule and underwent mammogram and ultrasound on 08/06/17 showing three areas of concern at 10 o'clock, 1130 o'clock, and at 8 o'clock. Biopsy on 08/07/2017 that demonstrated at 11:30 fat necrosis, however at 10:00 it was positive for IDC, grade  3, ER+(50%), PR-(0%), Ki-67 90%, HER-2 negative (ratio 1.48).  (a) staging chest CT 08/15/2017 shows 0.5 cm left lower lobe lung nodule of uncertain significance  (8) status post left modified radical mastectomy and right simple mastectomy 08/25/2017 showing  (a) right breast, no evidence of malignancy  (b) left breast, ypT2 ypN1 invasive ductal carcinoma, grade 3, with negative margins; repeat prognostic panel: Triple negative  (9) started adjuvant chemotherapy with cyclophosphamide/methotrexate/fluorouracil 11/10/2016, 8 cycles planned  (a) will continue trastuzumab/Pertuzumab concurrently  (b) echocardiogram 09/26/2017 showed an ejection fraction in the 60-65% range  (10) adjuvant radiation with cycles 3-5 of chemotherapy  (11) resume antiestrogens at the completion of chemotherapy   PLAN:   [name] did remarkably well with her first cycle of CMF.  The plan will be to continue a total of 8 cycles.  She will continue on the Herceptin and Pertuzumab with each cycle.  At the end of that she will switch to trastuzumab alone.  With cycles 3 and 4 she will receive concurrent radiation  With cycles 5 through 8, she may consider treatment every 14 days.  She tells me her insurance has not approved her CMF.  This would be unusual since we usually obtain approval before treatment is initiated.  In any case this will be appealed and I am referring her to our billing specialist regarding that  She is going to see me January 18, but I do not intend to see her between treatments for subsequent cycles since she is doing so well  She knows to call for any other problems that may develop before her next visit.  Guy Seese, [name], MD  10/24/17 1:37 PM Medical Oncology and Hematology [facility] [address] [place], [place] [address] Tel. [phone]    Fax. [phone]  This document serves as a record of services personally performed  by Lurline Del, MD. It was created  on his behalf by Sheron Nightingale, a trained medical scribe. The creation of this record is based on the scribe's personal observations and the provider's statements to them.   I have reviewed the above documentation for accuracy and completeness, and I agree with the above.

## 2017-10-24 ENCOUNTER — Other Ambulatory Visit: Payer: BLUE CROSS/BLUE SHIELD

## 2017-10-24 ENCOUNTER — Ambulatory Visit (HOSPITAL_BASED_OUTPATIENT_CLINIC_OR_DEPARTMENT_OTHER): Payer: BLUE CROSS/BLUE SHIELD | Admitting: Oncology

## 2017-10-24 ENCOUNTER — Ambulatory Visit: Payer: BLUE CROSS/BLUE SHIELD

## 2017-10-24 ENCOUNTER — Other Ambulatory Visit (HOSPITAL_BASED_OUTPATIENT_CLINIC_OR_DEPARTMENT_OTHER): Payer: BLUE CROSS/BLUE SHIELD

## 2017-10-24 ENCOUNTER — Telehealth: Payer: Self-pay | Admitting: Oncology

## 2017-10-24 VITALS — BP 150/84 | HR 99 | Temp 98.7°F | Resp 18 | Ht 69.0 in | Wt 201.6 lb

## 2017-10-24 DIAGNOSIS — Z170 Estrogen receptor positive status [ER+]: Secondary | ICD-10-CM | POA: Diagnosis not present

## 2017-10-24 DIAGNOSIS — A419 Sepsis, unspecified organism: Secondary | ICD-10-CM

## 2017-10-24 DIAGNOSIS — C50912 Malignant neoplasm of unspecified site of left female breast: Secondary | ICD-10-CM

## 2017-10-24 DIAGNOSIS — C50212 Malignant neoplasm of upper-inner quadrant of left female breast: Secondary | ICD-10-CM

## 2017-10-24 LAB — COMPREHENSIVE METABOLIC PANEL
ALT: 8 U/L (ref 0–55)
AST: 9 U/L (ref 5–34)
Albumin: 4 g/dL (ref 3.5–5.0)
Alkaline Phosphatase: 115 U/L (ref 40–150)
Anion Gap: 7 mEq/L (ref 3–11)
BUN: 18.2 mg/dL (ref 7.0–26.0)
CHLORIDE: 104 meq/L (ref 98–109)
CO2: 29 meq/L (ref 22–29)
Calcium: 10.3 mg/dL (ref 8.4–10.4)
Creatinine: 0.8 mg/dL (ref 0.6–1.1)
EGFR: >60 mL/min/{1.73_m2} (ref >60)
GLUCOSE: 103 mg/dL (ref 70–140)
POTASSIUM: 5.4 meq/L — AB (ref 3.5–5.1)
SODIUM: 140 meq/L (ref 136–145)
Total Bilirubin: 0.28 mg/dL (ref 0.20–1.20)
Total Protein: 7.5 g/dL (ref 6.4–8.3)

## 2017-10-24 LAB — CBC WITH DIFFERENTIAL/PLATELET
BASO%: 1.4 % (ref 0.0–2.0)
BASOS ABS: 0.1 10*3/uL (ref 0.0–0.1)
EOS ABS: 0.7 10*3/uL — AB (ref 0.0–0.5)
EOS%: 11.6 % — ABNORMAL HIGH (ref 0.0–7.0)
HCT: 40.2 % (ref 34.8–46.6)
HGB: 12.7 g/dL (ref 11.6–15.9)
LYMPH%: 20.6 % (ref 14.0–49.7)
MCH: 26.6 pg (ref 25.1–34.0)
MCHC: 31.7 g/dL (ref 31.5–36.0)
MCV: 84 fL (ref 79.5–101.0)
MONO#: 0.2 10*3/uL (ref 0.1–0.9)
MONO%: 3 % (ref 0.0–14.0)
NEUT%: 63.4 % (ref 38.4–76.8)
NEUTROS ABS: 4.1 10*3/uL (ref 1.5–6.5)
PLATELETS: 292 10*3/uL (ref 145–400)
RBC: 4.78 10*6/uL (ref 3.70–5.45)
RDW: 14.9 % — ABNORMAL HIGH (ref 11.2–14.5)
WBC: 6.4 10*3/uL (ref 3.9–10.3)
lymph#: 1.3 10*3/uL (ref 0.9–3.3)

## 2017-10-24 NOTE — Telephone Encounter (Signed)
Gave patient AVS and calendar of upcoming January appointments °

## 2017-10-25 ENCOUNTER — Other Ambulatory Visit: Payer: Self-pay | Admitting: Oncology

## 2017-10-30 ENCOUNTER — Emergency Department (HOSPITAL_COMMUNITY): Payer: BLUE CROSS/BLUE SHIELD

## 2017-10-30 ENCOUNTER — Inpatient Hospital Stay (HOSPITAL_COMMUNITY)
Admission: EM | Admit: 2017-10-30 | Discharge: 2017-11-01 | DRG: 862 | Disposition: A | Discharge status: Home / Self Care | Payer: BLUE CROSS/BLUE SHIELD | Attending: Internal Medicine | Admitting: Internal Medicine

## 2017-10-30 ENCOUNTER — Other Ambulatory Visit: Payer: Self-pay

## 2017-10-30 ENCOUNTER — Encounter (HOSPITAL_COMMUNITY): Payer: Self-pay | Admitting: Emergency Medicine

## 2017-10-30 DIAGNOSIS — T148XXA Other injury of unspecified body region, initial encounter: Secondary | ICD-10-CM

## 2017-10-30 DIAGNOSIS — T451X5A Adverse effect of antineoplastic and immunosuppressive drugs, initial encounter: Secondary | ICD-10-CM | POA: Diagnosis present

## 2017-10-30 DIAGNOSIS — Z79899 Other long term (current) drug therapy: Secondary | ICD-10-CM

## 2017-10-30 DIAGNOSIS — F419 Anxiety disorder, unspecified: Secondary | ICD-10-CM | POA: Diagnosis present

## 2017-10-30 DIAGNOSIS — D6481 Anemia due to antineoplastic chemotherapy: Secondary | ICD-10-CM | POA: Diagnosis present

## 2017-10-30 DIAGNOSIS — Z853 Personal history of malignant neoplasm of breast: Secondary | ICD-10-CM

## 2017-10-30 DIAGNOSIS — Z8041 Family history of malignant neoplasm of ovary: Secondary | ICD-10-CM

## 2017-10-30 DIAGNOSIS — A419 Sepsis, unspecified organism: Secondary | ICD-10-CM | POA: Diagnosis not present

## 2017-10-30 DIAGNOSIS — Z923 Personal history of irradiation: Secondary | ICD-10-CM

## 2017-10-30 DIAGNOSIS — C50212 Malignant neoplasm of upper-inner quadrant of left female breast: Secondary | ICD-10-CM

## 2017-10-30 DIAGNOSIS — T8149XA Infection following a procedure, other surgical site, initial encounter: Secondary | ICD-10-CM | POA: Diagnosis not present

## 2017-10-30 DIAGNOSIS — Z9013 Acquired absence of bilateral breasts and nipples: Secondary | ICD-10-CM

## 2017-10-30 DIAGNOSIS — I959 Hypotension, unspecified: Secondary | ICD-10-CM | POA: Diagnosis present

## 2017-10-30 DIAGNOSIS — L089 Local infection of the skin and subcutaneous tissue, unspecified: Secondary | ICD-10-CM | POA: Diagnosis not present

## 2017-10-30 DIAGNOSIS — Z87891 Personal history of nicotine dependence: Secondary | ICD-10-CM

## 2017-10-30 DIAGNOSIS — T8144XA Sepsis following a procedure, initial encounter: Secondary | ICD-10-CM | POA: Diagnosis present

## 2017-10-30 DIAGNOSIS — D701 Agranulocytosis secondary to cancer chemotherapy: Secondary | ICD-10-CM | POA: Diagnosis present

## 2017-10-30 DIAGNOSIS — Z9221 Personal history of antineoplastic chemotherapy: Secondary | ICD-10-CM

## 2017-10-30 DIAGNOSIS — Z1501 Genetic susceptibility to malignant neoplasm of breast: Secondary | ICD-10-CM

## 2017-10-30 DIAGNOSIS — Z170 Estrogen receptor positive status [ER+]: Secondary | ICD-10-CM

## 2017-10-30 DIAGNOSIS — R509 Fever, unspecified: Secondary | ICD-10-CM

## 2017-10-30 DIAGNOSIS — F329 Major depressive disorder, single episode, unspecified: Secondary | ICD-10-CM | POA: Diagnosis present

## 2017-10-30 LAB — PROTIME-INR
INR: 0.97
Prothrombin Time: 12.8 seconds (ref 11.4–15.2)

## 2017-10-30 LAB — URINALYSIS, ROUTINE W REFLEX MICROSCOPIC
BILIRUBIN URINE: NEGATIVE
Glucose, UA: NEGATIVE mg/dL
HGB URINE DIPSTICK: NEGATIVE
Ketones, ur: NEGATIVE mg/dL
Leukocytes, UA: NEGATIVE
Nitrite: NEGATIVE
Protein, ur: NEGATIVE mg/dL
SPECIFIC GRAVITY, URINE: 1.006 (ref 1.005–1.030)
pH: 6 (ref 5.0–8.0)

## 2017-10-30 LAB — CBC WITH DIFFERENTIAL/PLATELET
BASOS ABS: 0 10*3/uL (ref 0.0–0.1)
BASOS PCT: 2 %
EOS ABS: 0.3 10*3/uL (ref 0.0–0.7)
EOS PCT: 13 %
HCT: 37.2 % (ref 36.0–46.0)
Hemoglobin: 11.6 g/dL — ABNORMAL LOW (ref 12.0–15.0)
Lymphocytes Relative: 21 %
Lymphs Abs: 0.5 10*3/uL — ABNORMAL LOW (ref 0.7–4.0)
MCH: 26.5 pg (ref 26.0–34.0)
MCHC: 31.2 g/dL (ref 30.0–36.0)
MCV: 84.9 fL (ref 78.0–100.0)
MONO ABS: 0.4 10*3/uL (ref 0.1–1.0)
Monocytes Relative: 17 %
Neutro Abs: 1.2 10*3/uL — ABNORMAL LOW (ref 1.7–7.7)
Neutrophils Relative %: 47 %
PLATELETS: 215 10*3/uL (ref 150–400)
RBC: 4.38 MIL/uL (ref 3.87–5.11)
RDW: 14 % (ref 11.5–15.5)
WBC: 2.6 10*3/uL — ABNORMAL LOW (ref 4.0–10.5)

## 2017-10-30 LAB — COMPREHENSIVE METABOLIC PANEL
ALT: 12 U/L — AB (ref 14–54)
AST: 19 U/L (ref 15–41)
Albumin: 3.7 g/dL (ref 3.5–5.0)
Alkaline Phosphatase: 111 U/L (ref 38–126)
Anion gap: 7 (ref 5–15)
BILIRUBIN TOTAL: 0.6 mg/dL (ref 0.3–1.2)
BUN: 13 mg/dL (ref 6–20)
CALCIUM: 9.2 mg/dL (ref 8.9–10.3)
CO2: 25 mmol/L (ref 22–32)
CREATININE: 0.84 mg/dL (ref 0.44–1.00)
Chloride: 105 mmol/L (ref 101–111)
GFR calc Af Amer: >60 mL/min (ref >60)
Glucose, Bld: 157 mg/dL — ABNORMAL HIGH (ref 65–99)
Potassium: 3.8 mmol/L (ref 3.5–5.1)
Sodium: 137 mmol/L (ref 135–145)
TOTAL PROTEIN: 6.8 g/dL (ref 6.5–8.1)

## 2017-10-30 LAB — I-STAT BETA HCG BLOOD, ED (MC, WL, AP ONLY)

## 2017-10-30 LAB — I-STAT CG4 LACTIC ACID, ED
LACTIC ACID, VENOUS: 1.91 mmol/L — AB (ref 0.5–1.9)
Lactic Acid, Venous: 1.12 mmol/L (ref 0.5–1.9)

## 2017-10-30 MED ORDER — TIZANIDINE HCL 4 MG PO TABS
4.0000 mg | ORAL_TABLET | Freq: Every day | ORAL | Status: DC
  Administered 2017-10-31 (×2): 4 mg via ORAL
  Filled 2017-10-30 (×2): qty 1

## 2017-10-30 MED ORDER — CHOLESTYRAMINE 4 G PO PACK: 4.0000 g | PACK | Freq: Three times a day (TID) | ORAL | Status: DC | PRN

## 2017-10-30 MED ORDER — FAMOTIDINE 20 MG PO TABS
20.0000 mg | ORAL_TABLET | Freq: Two times a day (BID) | ORAL | Status: DC
  Administered 2017-10-31 – 2017-11-01 (×3): 20 mg via ORAL
  Filled 2017-10-30 (×3): qty 1

## 2017-10-30 MED ORDER — ONDANSETRON HCL 4 MG PO TABS: 4.0000 mg | ORAL_TABLET | Freq: Four times a day (QID) | ORAL | Status: DC | PRN

## 2017-10-30 MED ORDER — ACETAMINOPHEN 325 MG PO TABS: 650.0000 mg | ORAL_TABLET | Freq: Four times a day (QID) | ORAL | Status: DC | PRN

## 2017-10-30 MED ORDER — ONDANSETRON HCL 4 MG/2ML IJ SOLN: 4.0000 mg | Freq: Four times a day (QID) | INTRAMUSCULAR | Status: DC | PRN

## 2017-10-30 MED ORDER — LORAZEPAM 0.5 MG PO TABS: 0.5000 mg | ORAL_TABLET | Freq: Four times a day (QID) | ORAL | Status: DC | PRN

## 2017-10-30 MED ORDER — DIPHENHYDRAMINE HCL 25 MG PO CAPS: 50.0000 mg | ORAL_CAPSULE | Freq: Every evening | ORAL | Status: DC | PRN

## 2017-10-30 MED ORDER — HYDROCODONE-ACETAMINOPHEN 5-325 MG PO TABS
1.0000 | ORAL_TABLET | Freq: Four times a day (QID) | ORAL | Status: DC | PRN
  Administered 2017-10-30: 1 via ORAL
  Filled 2017-10-30: qty 1

## 2017-10-30 MED ORDER — BUSPIRONE HCL 5 MG PO TABS
15.0000 mg | ORAL_TABLET | Freq: Every day | ORAL | Status: DC
  Administered 2017-10-31 – 2017-11-01 (×2): 15 mg via ORAL
  Filled 2017-10-30: qty 3
  Filled 2017-10-30: qty 2

## 2017-10-30 MED ORDER — VANCOMYCIN HCL 10 G IV SOLR
1500.0000 mg | INTRAVENOUS | Status: DC
  Administered 2017-10-31: 1500 mg via INTRAVENOUS
  Filled 2017-10-30 (×3): qty 1500

## 2017-10-30 MED ORDER — DEXTROSE 5 % IV SOLN
2.0000 g | Freq: Once | INTRAVENOUS | Status: AC
  Administered 2017-10-30: 2 g via INTRAVENOUS
  Filled 2017-10-30: qty 2

## 2017-10-30 MED ORDER — DEXTROSE 5 % IV SOLN
2.0000 g | Freq: Two times a day (BID) | INTRAVENOUS | Status: DC
  Administered 2017-10-31 (×2): 2 g via INTRAVENOUS
  Filled 2017-10-30 (×3): qty 2

## 2017-10-30 MED ORDER — ENOXAPARIN SODIUM 40 MG/0.4ML ~~LOC~~ SOLN
40.0000 mg | SUBCUTANEOUS | Status: DC
  Administered 2017-10-31: 17:00:00 40 mg via SUBCUTANEOUS
  Filled 2017-10-30 (×2): qty 0.4

## 2017-10-30 MED ORDER — SODIUM CHLORIDE 0.9 % IV SOLN
INTRAVENOUS | Status: DC
  Administered 2017-10-31 – 2017-11-01 (×4): via INTRAVENOUS

## 2017-10-30 MED ORDER — VANCOMYCIN HCL IN DEXTROSE 1-5 GM/200ML-% IV SOLN
1000.0000 mg | INTRAVENOUS | Status: AC
  Administered 2017-10-30: 1000 mg via INTRAVENOUS
  Filled 2017-10-30: qty 200

## 2017-10-30 MED ORDER — ALPRAZOLAM ER 1 MG PO TB24
1.0000 mg | ORAL_TABLET | Freq: Every day | ORAL | Status: DC
  Administered 2017-10-31: 1 mg via ORAL
  Filled 2017-10-30: qty 1

## 2017-10-30 MED ORDER — ACETAMINOPHEN 325 MG PO TABS
650.0000 mg | ORAL_TABLET | Freq: Four times a day (QID) | ORAL | Status: DC | PRN
  Filled 2017-10-30: qty 2

## 2017-10-30 MED ORDER — PAROXETINE HCL 20 MG PO TABS
40.0000 mg | ORAL_TABLET | Freq: Every morning | ORAL | Status: DC
  Administered 2017-10-31 – 2017-11-01 (×2): 40 mg via ORAL
  Filled 2017-10-30 (×2): qty 2

## 2017-10-30 MED ORDER — IBUPROFEN 200 MG PO TABS
600.0000 mg | ORAL_TABLET | Freq: Four times a day (QID) | ORAL | Status: DC | PRN
  Administered 2017-10-31: 600 mg via ORAL
  Filled 2017-10-30: qty 3

## 2017-10-30 NOTE — Progress Notes (Signed)
Pharmacy Antibiotic Note  [name] is a 62 y.o. female admitted on 10/30/2017 with wound infection.  Pharmacy has been consulted for vanc/cefepime dosing.  Plan: 1) Vancomycin 1g x 1 given in ER at 2143. Start vancomycin 1500mg  IV q24 thereafter (goal AUC 400-500) 2) Cefepime 2g IV q12 3) Will check vanc peak/trough as needed  Height: 5\' 9"  (175.3 cm) Weight: 201 lb (91.2 kg) IBW/kg (Calculated) : 66.2  Temp (24hrs), Avg:102.3 F (39.1 C), Min:102.3 F (39.1 C), Max:102.3 F (39.1 C)  Recent Labs  Lab 10/24/17 1307 10/30/17 1927 10/30/17 1945 10/30/17 2109  WBC 6.4 2.6*  --   --   CREATININE 0.8 0.84  --   --   LATICACIDVEN  --   --  1.91* 1.12    Estimated Creatinine Clearance: 85.7 mL/min (by C-G formula based on SCr of 0.84 mg/dL).    No Known Allergies   Thank you for allowing pharmacy to be a part of this patient's care.   Adrian Saran, PharmD, BCPS Pager [phone] 10/30/2017 10:36 PM

## 2017-10-30 NOTE — H&P (Signed)
History and Physical    [name] SWN:[id number] DOB: November 05, 1955 DOA: 10/30/2017  PCP: Chesley Noon, MD  Patient coming from: Home  I have personally briefly reviewed patient's old medical records in [place]  Chief Complaint: Fever  HPI: [name] is a 62 y.o. 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  Presents to ED with fever.  Began feeling unwell earlier this evening, fever to 102 at home.  No cough, congestion, dysuria, N/V/D, abd pain.  Has had increased discharge from the wound compared to baseline, but no redness or erythema.   ED Course: Wound culture obtained, WBC 2.6k, Tm 102.3.  BPs running 938-182 systolic.  Given cefepime and vanc.   Review of Systems: As per HPI otherwise 10 point review of systems negative.   Past Medical History:  Diagnosis Date  . Anxiety   . CAP (community acquired pneumonia)   . Depression   . Malignant neoplasm of upper-inner quadrant of left female breast ([place]) 10/01/2016  . Personal history of chemotherapy   . Personal history of radiation therapy   . Tobacco use     Past Surgical History:  Procedure Laterality Date  . ABDOMINAL HYSTERECTOMY  2011   Laparoscopic  . AUGMENTATION MAMMAPLASTY Bilateral 03/2017  . BREAST LUMPECTOMY Left 04/05/2017  . BREAST LUMPECTOMY WITH SENTINEL LYMPH NODE BIOPSY Left 04/07/2017  . BREAST REDUCTION SURGERY Left 04/07/2017  . CHOLECYSTECTOMY    . open reduction left ankle    . SKIN GRAFT Left 06/19/2017   bil skin grafts to breasts     reports that she quit smoking about a year ago. She has a 10.00 pack-year smoking history. she has never used smokeless tobacco. She reports that she does not drink alcohol or use drugs.  No Known Allergies  Family History  Problem Relation Age of Onset  . Heart disease Mother   . Heart disease Father   . Cancer Sister        ovarian ca     Prior to  Admission medications   Medication Sig Start Date End Date Taking? Authorizing Provider  acetaminophen (TYLENOL) 325 MG tablet Take 650 mg by mouth every 6 (six) hours as needed for moderate pain.    Yes [provider]  ALPRAZolam (XANAX XR) 1 MG 24 hr tablet Take 1 tablet (1 mg total) by mouth daily. 09/10/17  Yes [name], [name], MD  busPIRone (BUSPAR) 30 MG tablet Take 15 mg by mouth daily.  01/09/15  Yes [provider]  cholestyramine (QUESTRAN) 4 g packet Take 1 packet (4 g total) by mouth 3 (three) times daily with meals. Patient taking differently: Take 4 g by mouth 3 (three) times daily with meals as needed (reaction to chemo).  07/11/17  Yes Causey, Charlestine Massed, NP  dexamethasone (DECADRON) 4 MG tablet Take 2 tablets (8 mg total) by mouth daily. Start the day after chemotherapy for 2 days. Take with food. 09/19/17  Yes [name], [name], MD  diphenhydrAMINE (BENADRYL) 25 MG tablet Take 50 mg by mouth as needed for sleep.    Yes [provider]  Echinacea-Zinc-Vitamin C (ZINC PLUS VITAMIN C/ECHINACEA PO) Take 1 tablet by mouth daily.   Yes [provider]  HYDROcodone-acetaminophen (NORCO/VICODIN) 5-325 MG tablet Take 1 tablet by mouth every 6 (six) hours as needed for moderate pain.  10/10/17  Yes [provider]  ibuprofen (ADVIL,MOTRIN) 200 MG  tablet Take 600 mg by mouth every 6 (six) hours as needed for moderate pain.    Yes [provider]  lidocaine-prilocaine (EMLA) cream Apply to affected area once 09/19/17  Yes [name], [name], MD  LORazepam (ATIVAN) 0.5 MG tablet Take 1 tablet (0.5 mg total) by mouth every 6 (six) hours as needed (Nausea or vomiting). 09/19/17  Yes [name], [name], MD  nystatin (MYCOSTATIN) 100000 UNIT/ML suspension Take 5 mLs by mouth daily as needed (ulcers).  12/05/16  Yes [provider]  ondansetron (ZOFRAN) 8 MG tablet Take 1 tablet (8 mg total) by mouth 2 (two) times daily as needed  for refractory nausea / vomiting. Start on day 3 after chemotherapy. 09/19/17  Yes [name], [name], MD  PARoxetine (PAXIL) 40 MG tablet Take 40 mg by mouth every morning. 01/09/15  Yes [provider]  prochlorperazine (COMPAZINE) 10 MG tablet Take 1 tablet (10 mg total) by mouth every 6 (six) hours as needed (Nausea or vomiting). 09/19/17  Yes [name], [name], MD  ranitidine (ZANTAC) 150 MG tablet Take 150 mg by mouth daily as needed for heartburn.    Yes [provider]  tiZANidine (ZANAFLEX) 4 MG tablet TAKE 1 TABLET BY MOUTH Bedtime 05/22/17  Yes [provider]    Physical Exam: Vitals:   10/30/17 2120 10/30/17 2130 10/30/17 2200 10/30/17 2205  BP: 109/66 (!) 115/58 (!) 82/68 (!) 82/68  Pulse: 89 81 83 85  Resp: (!) _0 Temp:      TempSrc:      SpO2: 98%   98%  Weight:      Height:        Constitutional: NAD, calm, comfortable Eyes: PERRL, lids and conjunctivae normal ENMT: Mucous membranes are moist. Posterior pharynx clear of any exudate or lesions.Normal dentition.  Neck: normal, supple, no masses, no thyromegaly Respiratory: clear to auscultation bilaterally, no wheezing, no crackles. Normal respiratory effort. No accessory muscle use.  Cardiovascular: Regular rate and rhythm, no murmurs / rubs / gallops. No extremity edema. 2+ pedal pulses. No carotid bruits.  Abdomen: no tenderness, no masses palpated. No hepatosplenomegaly. Bowel sounds positive.  Musculoskeletal: no clubbing / cyanosis. No joint deformity upper and lower extremities. Good ROM, no contractures. Normal muscle tone.  Skin: Along the medial aspect of the right mastectomy site, there is an approximately 1.5-2 cm open wound that tracks medially.  There is a small amount of yellow brown serous drainage with packing in place.  No overt purulence.  No overt overlying warmth or erythema.  Neurologic: CN 2-12 grossly intact. Sensation intact, DTR normal. Strength 5/5 in all 4.    Psychiatric: Normal judgment and insight. Alert and oriented x 3. Normal mood.    Labs on Admission: I have personally reviewed following labs and imaging studies  CBC: Recent Labs  Lab 10/24/17 1307 10/30/17 1927  WBC 6.4 2.6*  NEUTROABS 4.1 1.2*  HGB 12.7 11.6*  HCT 40.2 37.2  MCV 84.0 84.9  PLT 292 436   Basic Metabolic Panel: Recent Labs  Lab 10/24/17 1307 10/30/17 1927  NA 140 137  K 5.4* 3.8  CL  --  105  CO2 29 25  GLUCOSE 103 157*  BUN 18.2 13  CREATININE 0.8 0.84  CALCIUM 10.3 9.2   GFR: Estimated Creatinine Clearance: 85.7 mL/min (by C-G formula based on SCr of 0.84 mg/dL). Liver Function Tests: Recent Labs  Lab 10/24/17 1307 10/30/17 1927  AST 9 19  ALT 8  12*  ALKPHOS 115 111  BILITOT 0.28 0.6  PROT 7.5 6.8  ALBUMIN 4.0 3.7   No results for input(s): LIPASE, AMYLASE in the last 168 hours. No results for input(s): AMMONIA in the last 168 hours. Coagulation Profile: Recent Labs  Lab 10/30/17 1927  INR 0.97   Cardiac Enzymes: No results for input(s): CKTOTAL, CKMB, CKMBINDEX, TROPONINI in the last 168 hours. BNP (last 3 results) No results for input(s): PROBNP in the last 8760 hours. HbA1C: No results for input(s): HGBA1C in the last 72 hours. CBG: No results for input(s): GLUCAP in the last 168 hours. Lipid Profile: No results for input(s): CHOL, HDL, LDLCALC, TRIG, CHOLHDL, LDLDIRECT in the last 72 hours. Thyroid Function Tests: No results for input(s): TSH, T4TOTAL, FREET4, T3FREE, THYROIDAB in the last 72 hours. Anemia Panel: No results for input(s): VITAMINB12, FOLATE, FERRITIN, TIBC, IRON, RETICCTPCT in the last 72 hours. Urine analysis:    Component Value Date/Time   COLORURINE STRAW (A) 10/30/2017 2050   APPEARANCEUR CLEAR 10/30/2017 2050   [place] 1.006 10/30/2017 2050   [place] 6.0 10/30/2017 2050   GLUCOSEU NEGATIVE 10/30/2017 2050   [place] NEGATIVE 10/30/2017 2050   [place] NEGATIVE 10/30/2017 2050   [place] 10/30/2017 2050   PROTEINUR NEGATIVE 10/30/2017 2050   NITRITE NEGATIVE 10/30/2017 2050   LEUKOCYTESUR NEGATIVE 10/30/2017 2050    Radiological Exams on Admission: Dg Chest 2 View  Result Date: 10/30/2017 CLINICAL DATA:  Patient is currently on chemotherapy for breast cancer. Fevers and chills. EXAM: CHEST  2 VIEW COMPARISON:  CT of the chest 08/15/2017 FINDINGS: Right IJ approach injectable port terminates in the expected location of superior vena cava. Cardiomediastinal silhouette is normal. Mediastinal contours appear intact. There is no evidence of focal airspace consolidation, pleural effusion or pneumothorax. Osseous structures are without acute abnormality. Post bilateral mastectomy. IMPRESSION: No active cardiopulmonary disease. Electronically Signed   By: Fidela Salisbury M.D.   On: 10/30/2017 19:17    EKG: Independently reviewed.  Assessment/Plan Principal Problem:   Sepsis ([place]) Active Problems:   Malignant neoplasm of upper-inner quadrant of left breast in female, estrogen receptor positive ([place])   Wound infection    1. Sepsis - suspected source at the moment is the L breast wound 1. Culture pending 2. Cefepime and vanc 3. EDP spoke with Dr. Jana Hakim 1. Not technically neutropenic 2. Will see in AM 4. IVF: NS at 100 cc/hr 5. Tylenol PRN fever 6. Wound care consult 2. BRCA - 1. Dr. Jana Hakim to see in AM  DVT prophylaxis: Lovenox Code Status: Full Family Communication: Husband at bedside Disposition Plan: Home after admit Consults called: Dr. Jana Hakim Admission status: Place in obs - may need to be converted to inpatient though / meet criteria.   Etta Quill DO [facility] Pager [phone]  If 7AM-7PM, please contact day team taking care of patient [email or web address] Password [facility]  10/30/2017, 10:49 PM

## 2017-10-30 NOTE — ED Provider Notes (Signed)
[facility] DEPT Provider Note   CSN: [id number] Arrival date & time: 10/30/17  1827     History   Chief Complaint Chief Complaint  Patient presents with  . Fever  . Cancer    HPI SAFIYYAH VASCONEZ is a 62 y.o. female.  HPI   62 year old female with past medical history of recurrent breast cancer currently on chemotherapy, with chronic wound to right breast receiving regular wound packing, who presents with fever.  The patient states that earlier this afternoon, she began feeling acutely unwell.  She began having fevers and chills.  Her fever went up to 102 and she subsequently presented for evaluation.  She currently endorses mild, generalized fatigue.  She endorses fever as mentioned.  She denies any other complaints.  She not had any cough, congestion, runny nose, cough, or shortness of breath.  She has not had any abdominal pain, nausea, or vomiting.  Denies any urinary symptoms.  She does note that her right breast wound has been draining more than usual, but this was probed in the office yesterday and had a small amount of bleeding due to her packing in the office.  She denies any increase in pain or redness at the site.  Past Medical History:  Diagnosis Date  . Anxiety   . CAP (community acquired pneumonia)   . Depression   . Malignant neoplasm of upper-inner quadrant of left female breast ([place]) 10/01/2016  . Personal history of chemotherapy   . Personal history of radiation therapy   . Tobacco use     Patient Active Problem List   Diagnosis Date Noted  . Wound infection 10/30/2017  . Recurrent breast cancer, left ([place]) 08/12/2017  . Malignant neoplasm of upper-inner quadrant of left breast in female, estrogen receptor positive ([place]) 10/01/2016  . CAP (community acquired pneumonia) 12/27/2015  . Elevated lactic acid level 12/27/2015  . Hypokalemia 12/27/2015  . Leukocytosis 12/27/2015  . Tobacco abuse 12/27/2015  . Sepsis ([place])  12/27/2015    Past Surgical History:  Procedure Laterality Date  . ABDOMINAL HYSTERECTOMY  2011   Laparoscopic  . AUGMENTATION MAMMAPLASTY Bilateral 03/2017  . BREAST LUMPECTOMY Left 04/05/2017  . BREAST LUMPECTOMY WITH SENTINEL LYMPH NODE BIOPSY Left 04/07/2017  . BREAST REDUCTION SURGERY Left 04/07/2017  . CHOLECYSTECTOMY    . open reduction left ankle    . SKIN GRAFT Left 06/19/2017   bil skin grafts to breasts    OB History    No data available       Home Medications    Prior to Admission medications   Medication Sig Start Date End Date Taking? Authorizing Provider  acetaminophen (TYLENOL) 325 MG tablet Take 650 mg by mouth every 6 (six) hours as needed for moderate pain.    Yes [provider]  ALPRAZolam (XANAX XR) 1 MG 24 hr tablet Take 1 tablet (1 mg total) by mouth daily. 09/10/17  Yes [name], [name], MD  busPIRone (BUSPAR) 30 MG tablet Take 15 mg by mouth daily.  01/09/15  Yes [provider]  cholestyramine (QUESTRAN) 4 g packet Take 1 packet (4 g total) by mouth 3 (three) times daily with meals. Patient taking differently: Take 4 g by mouth 3 (three) times daily with meals as needed (reaction to chemo).  07/11/17  Yes Causey, Charlestine Massed, NP  dexamethasone (DECADRON) 4 MG tablet Take 2 tablets (8 mg total) by mouth daily. Start the day after chemotherapy for 2 days. Take with food. 09/19/17  Yes [name], [name], MD  diphenhydrAMINE (BENADRYL) 25 MG tablet Take 50 mg by mouth as needed for sleep.    Yes [provider]  Echinacea-Zinc-Vitamin C (ZINC PLUS VITAMIN C/ECHINACEA PO) Take 1 tablet by mouth daily.   Yes [provider]  HYDROcodone-acetaminophen (NORCO/VICODIN) 5-325 MG tablet Take 1 tablet by mouth every 6 (six) hours as needed for moderate pain.  10/10/17  Yes [provider]  ibuprofen (ADVIL,MOTRIN) 200 MG tablet Take 600 mg by mouth every 6 (six) hours as needed for moderate pain.    Yes [provider]  lidocaine-prilocaine (EMLA) cream Apply to affected area once 09/19/17  Yes [name], [name], MD  LORazepam (ATIVAN) 0.5 MG tablet Take 1 tablet (0.5 mg total) by mouth every 6 (six) hours as needed (Nausea or vomiting). 09/19/17  Yes [name], [name], MD  nystatin (MYCOSTATIN) 100000 UNIT/ML suspension Take 5 mLs by mouth daily as needed (ulcers).  12/05/16  Yes [provider]  ondansetron (ZOFRAN) 8 MG tablet Take 1 tablet (8 mg total) by mouth 2 (two) times daily as needed for refractory nausea / vomiting. Start on day 3 after chemotherapy. 09/19/17  Yes [name], [name], MD  PARoxetine (PAXIL) 40 MG tablet Take 40 mg by mouth every morning. 01/09/15  Yes [provider]  prochlorperazine (COMPAZINE) 10 MG tablet Take 1 tablet (10 mg total) by mouth every 6 (six) hours as needed (Nausea or vomiting). 09/19/17  Yes [name], [name], MD  ranitidine (ZANTAC) 150 MG tablet Take 150 mg by mouth daily as needed for heartburn.    Yes [provider]  tiZANidine (ZANAFLEX) 4 MG tablet TAKE 1 TABLET BY MOUTH Bedtime 05/22/17  Yes [provider]    Family History Family History  Problem Relation Age of Onset  . Heart disease Mother   . Heart disease Father   . Cancer Sister        ovarian ca    Social History Social History   Tobacco Use  . Smoking status: Former Smoker    Packs/day: 0.50    Years: 20.00    Pack years: 10.00    Last attempt to quit: 10/21/2016    Years since quitting: 1.0  . Smokeless tobacco: Never Used  Substance Use Topics  . Alcohol use: No  . Drug use: No     Allergies   Patient has no known allergies.   Review of Systems Review of Systems  Constitutional: Positive for chills, fatigue and fever.  Skin: Positive for wound.  Neurological: Positive for weakness.  All other systems reviewed and are negative.    Physical Exam Updated Vital Signs BP (!) 111/49   Pulse 78   Temp (!) 102.3 F (39.1  C) (Oral)   Resp 20   Ht 5\' 9"  (1.753 m)   Wt 91.2 kg (201 lb)   SpO2 99%   BMI 29.68 kg/m   Physical Exam  Constitutional: She is oriented to person, place, and time. She appears well-developed and well-nourished. No distress.  HENT:  Head: Normocephalic and atraumatic.  Eyes: Conjunctivae are normal.  Neck: Neck supple.  Cardiovascular: Normal rate, regular rhythm and normal heart sounds. Exam reveals no friction rub.  No murmur heard. Pulmonary/Chest: Effort normal and breath sounds normal. No respiratory distress. She has no wheezes. She has no rales.  Along the medial aspect of the right mastectomy site, there is an approximately 1.5-2 cm open wound that tracks medially.  There is  a small amount of yellow brown serous drainage with packing in place.  No overt purulence.  No overt overlying warmth or erythema.  Abdominal: She exhibits no distension.  Musculoskeletal: She exhibits no edema.  Neurological: She is alert and oriented to person, place, and time. She exhibits normal muscle tone.  Skin: Skin is warm. Capillary refill takes less than 2 seconds.  Psychiatric: She has a normal mood and affect.  Nursing note and vitals reviewed.    ED Treatments / Results  Labs (all labs ordered are listed, but only abnormal results are displayed) Labs Reviewed  COMPREHENSIVE METABOLIC PANEL - Abnormal; Notable for the following components:      Result Value   Glucose, Bld 157 (*)    ALT 12 (*)    All other components within normal limits  CBC WITH DIFFERENTIAL/PLATELET - Abnormal; Notable for the following components:   WBC 2.6 (*)    Hemoglobin 11.6 (*)    Neutro Abs 1.2 (*)    Lymphs Abs 0.5 (*)    All other components within normal limits  URINALYSIS, ROUTINE W REFLEX MICROSCOPIC - Abnormal; Notable for the following components:   Color, Urine STRAW (*)    All other components within normal limits  I-STAT CG4 LACTIC ACID, ED - Abnormal; Notable for the following components:    Lactic Acid, Venous 1.91 (*)    All other components within normal limits  CULTURE, BLOOD (ROUTINE X 2)  CULTURE, BLOOD (ROUTINE X 2)  AEROBIC CULTURE (SUPERFICIAL SPECIMEN)  PROTIME-INR  HIV ANTIBODY (ROUTINE TESTING)  CBC  BASIC METABOLIC PANEL  I-STAT BETA HCG BLOOD, ED ([facility], [facility], [facility] ONLY)  I-STAT CG4 LACTIC ACID, ED    EKG  EKG Interpretation  Date/Time:  Thursday October 30 2017 20:50:29 EST Ventricular Rate:  91 PR Interval:    QRS Duration: 93 QT Interval:  397 QTC Calculation: 489 R Axis:   -27 Text Interpretation:  Sinus rhythm RSR' in V1 or V2, probably normal variant Inferior infarct, old No significant change since last tracing Confirmed by Duffy Bruce [phone]) on 10/30/2017 11:08:58 PM       Radiology Dg Chest 2 View  Result Date: 10/30/2017 CLINICAL DATA:  Patient is currently on chemotherapy for breast cancer. Fevers and chills. EXAM: CHEST  2 VIEW COMPARISON:  CT of the chest 08/15/2017 FINDINGS: Right IJ approach injectable port terminates in the expected location of superior vena cava. Cardiomediastinal silhouette is normal. Mediastinal contours appear intact. There is no evidence of focal airspace consolidation, pleural effusion or pneumothorax. Osseous structures are without acute abnormality. Post bilateral mastectomy. IMPRESSION: No active cardiopulmonary disease. Electronically Signed   By: Fidela Salisbury M.D.   On: 10/30/2017 19:17    Procedures Procedures (including critical care time)  Medications Ordered in ED Medications  busPIRone (BUSPAR) tablet 15 mg (not administered)  ALPRAZolam (XANAX XR) 24 hr tablet 1 mg (not administered)  cholestyramine (QUESTRAN) packet 4 g (not administered)  diphenhydrAMINE (BENADRYL) capsule 50 mg (not administered)  ibuprofen (ADVIL,MOTRIN) tablet 600 mg (not administered)  HYDROcodone-acetaminophen (NORCO/VICODIN) 5-325 MG per tablet 1 tablet (not administered)  PARoxetine (PAXIL) tablet 40 mg (not  administered)  famotidine (PEPCID) tablet 20 mg (not administered)  tiZANidine (ZANAFLEX) tablet 4 mg (not administered)  vancomycin (VANCOCIN) 1,500 mg in sodium chloride 0.9 % 500 mL IVPB (not administered)  ceFEPIme (MAXIPIME) 2 g in dextrose 5 % 50 mL IVPB (not administered)  ondansetron (ZOFRAN) tablet 4 mg (not administered)    Or  ondansetron (ZOFRAN) injection 4 mg (not administered)  enoxaparin (LOVENOX) injection 40 mg (not administered)  0.9 %  sodium chloride infusion (not administered)  acetaminophen (TYLENOL) tablet 650 mg (not administered)  LORazepam (ATIVAN) tablet 0.5 mg (not administered)  ceFEPIme (MAXIPIME) 2 g in dextrose 5 % 50 mL IVPB (0 g Intravenous Stopped 10/30/17 2143)  vancomycin (VANCOCIN) IVPB 1000 mg/200 mL premix (1,000 mg Intravenous New Bag/Given 10/30/17 2143)     Initial Impression / Assessment and Plan / ED Course  I have reviewed the triage vital signs and the nursing notes.  Pertinent labs & imaging results that were available during my care of the patient were reviewed by me and considered in my medical decision making (see chart for details).     62 year old female with past medical history as above, currently on chemotherapy, who presents with fever and generalized body aches. On arrival, the patient is febrile, tachycardic, on arrival the patient is febrile, tachycardic, but nontoxic.  She has a mild lactic acidosis.  Will cover empirically.  She has a moderate neutropenia with ANC of 1.2.  Unclear source at this time.  The patient did recently have her right breast wound packed, though she was just evaluated for this without apparent signs of infection at her surgeon.  The wound does not appear warm or erythematous at this time.  Chest x-ray and urinalysis unremarkable.  I discussed with Dr. Candace Gallus of oncology. Pt would much prefer to remain here in [place]. Discussed transfer to [facility] versus admission here w/ [name], who feels it is  reasonable to admit pt here, start ABX, and monitor as pt may be able to f[facility] with [facility] as outpt if wound does not appear infected. He will see pt in AM.  Final Clinical Impressions(s) / ED Diagnoses   Final diagnoses:  Fever in adult    ED Discharge Orders    None       Duffy Bruce, MD 10/30/17 2309

## 2017-10-30 NOTE — ED Triage Notes (Signed)
Pt verbalizes "a hard chill for two hours today." Took Motrin at 1600 for fever of 102; pt verbalizes has packed wound to right breast; awaiting approval for wound vac by insurance.

## 2017-10-30 NOTE — Progress Notes (Signed)
A consult was received from an ED physician for vancomycin per pharmacy dosing.  The patient's profile has been reviewed for ht/wt/allergies/indication/available labs.   A one time order has been placed for vancomycin.   Further antibiotics/pharmacy consults should be ordered by admitting physician if indicated.                       Thank you, Clovis Riley 10/30/2017  8:36 PM

## 2017-10-31 DIAGNOSIS — Z9013 Acquired absence of bilateral breasts and nipples: Secondary | ICD-10-CM | POA: Diagnosis not present

## 2017-10-31 DIAGNOSIS — Z1501 Genetic susceptibility to malignant neoplasm of breast: Secondary | ICD-10-CM | POA: Diagnosis not present

## 2017-10-31 DIAGNOSIS — C50212 Malignant neoplasm of upper-inner quadrant of left female breast: Secondary | ICD-10-CM

## 2017-10-31 DIAGNOSIS — D701 Agranulocytosis secondary to cancer chemotherapy: Secondary | ICD-10-CM | POA: Diagnosis present

## 2017-10-31 DIAGNOSIS — Z853 Personal history of malignant neoplasm of breast: Secondary | ICD-10-CM | POA: Diagnosis not present

## 2017-10-31 DIAGNOSIS — Z79899 Other long term (current) drug therapy: Secondary | ICD-10-CM | POA: Diagnosis not present

## 2017-10-31 DIAGNOSIS — Z170 Estrogen receptor positive status [ER+]: Secondary | ICD-10-CM

## 2017-10-31 DIAGNOSIS — L089 Local infection of the skin and subcutaneous tissue, unspecified: Secondary | ICD-10-CM

## 2017-10-31 DIAGNOSIS — T148XXA Other injury of unspecified body region, initial encounter: Secondary | ICD-10-CM

## 2017-10-31 DIAGNOSIS — F329 Major depressive disorder, single episode, unspecified: Secondary | ICD-10-CM | POA: Diagnosis present

## 2017-10-31 DIAGNOSIS — Z9221 Personal history of antineoplastic chemotherapy: Secondary | ICD-10-CM | POA: Diagnosis not present

## 2017-10-31 DIAGNOSIS — A419 Sepsis, unspecified organism: Secondary | ICD-10-CM

## 2017-10-31 DIAGNOSIS — F419 Anxiety disorder, unspecified: Secondary | ICD-10-CM | POA: Diagnosis present

## 2017-10-31 DIAGNOSIS — T451X5A Adverse effect of antineoplastic and immunosuppressive drugs, initial encounter: Secondary | ICD-10-CM | POA: Diagnosis present

## 2017-10-31 DIAGNOSIS — Z87891 Personal history of nicotine dependence: Secondary | ICD-10-CM | POA: Diagnosis not present

## 2017-10-31 DIAGNOSIS — R509 Fever, unspecified: Secondary | ICD-10-CM | POA: Diagnosis not present

## 2017-10-31 DIAGNOSIS — T8144XA Sepsis following a procedure, initial encounter: Secondary | ICD-10-CM | POA: Diagnosis present

## 2017-10-31 DIAGNOSIS — I959 Hypotension, unspecified: Secondary | ICD-10-CM | POA: Diagnosis present

## 2017-10-31 DIAGNOSIS — Z8041 Family history of malignant neoplasm of ovary: Secondary | ICD-10-CM | POA: Diagnosis not present

## 2017-10-31 DIAGNOSIS — D6481 Anemia due to antineoplastic chemotherapy: Secondary | ICD-10-CM | POA: Diagnosis present

## 2017-10-31 DIAGNOSIS — Z923 Personal history of irradiation: Secondary | ICD-10-CM | POA: Diagnosis not present

## 2017-10-31 DIAGNOSIS — T8149XA Infection following a procedure, other surgical site, initial encounter: Secondary | ICD-10-CM | POA: Diagnosis present

## 2017-10-31 LAB — BASIC METABOLIC PANEL
Anion gap: 4 — ABNORMAL LOW (ref 5–15)
BUN: 12 mg/dL (ref 6–20)
CHLORIDE: 106 mmol/L (ref 101–111)
CO2: 26 mmol/L (ref 22–32)
CREATININE: 0.87 mg/dL (ref 0.44–1.00)
Calcium: 8.2 mg/dL — ABNORMAL LOW (ref 8.9–10.3)
GFR calc non Af Amer: >60 mL/min (ref >60)
GLUCOSE: 121 mg/dL — AB (ref 65–99)
Potassium: 4.1 mmol/L (ref 3.5–5.1)
Sodium: 136 mmol/L (ref 135–145)

## 2017-10-31 LAB — CBC
HEMATOCRIT: 33.2 % — AB (ref 36.0–46.0)
HEMOGLOBIN: 10.3 g/dL — AB (ref 12.0–15.0)
MCH: 26.3 pg (ref 26.0–34.0)
MCHC: 31 g/dL (ref 30.0–36.0)
MCV: 84.9 fL (ref 78.0–100.0)
Platelets: 175 10*3/uL (ref 150–400)
RBC: 3.91 MIL/uL (ref 3.87–5.11)
RDW: 14.4 % (ref 11.5–15.5)
WBC: 3.3 10*3/uL — ABNORMAL LOW (ref 4.0–10.5)

## 2017-10-31 LAB — HIV ANTIBODY (ROUTINE TESTING W REFLEX): HIV Screen 4th Generation wRfx: NONREACTIVE

## 2017-10-31 MED ORDER — ENOXAPARIN SODIUM 40 MG/0.4ML ~~LOC~~ SOLN: 40.0000 mg | SUBCUTANEOUS | Status: DC

## 2017-10-31 NOTE — Progress Notes (Signed)
[name]  [phone] [phone]     ID: [name] DOB: July 26, 1956  MR#: [id number]  XIH#:[id number]  Patient Care Team: Chesley Noon, MD as PCP - General (Family Medicine) Excell Seltzer, MD as Consulting Physician (General Surgery) Avarose Mervine, [name], MD as Consulting Physician (Oncology) Gery Pray, MD as Consulting Physician (Radiation Oncology) Collene Gobble, MD as Consulting Physician (Pulmonary Disease) Verdell Carmine, MD as Referring Physician (Internal Medicine) Larey Dresser, MD as Consulting Physician (Cardiology) Beverely Pace, MD as Referring Physician (Surgical Oncology) OTHER MD:  CHIEF COMPLAINT: estrogen receptor positive, HER-2 positive breast cancer  CURRENT TREATMENT: Adjuvant Trastuzumab/ Pertuzumab and CMF chemotherapy   BREAST CANCER HISTORY: From the original intake note:  "[name]" had a fall while traveling living to [place] in their RV and injure her left upper arm and  her left breast . Approximately 3 weeks later she noted a mass in the left breast upper inner quadrant. It was somewhat tender. She brought this to medical attention and on 09/27/2016 underwent bilateral diagnostic mammography with tomography [facility] The breast density was category B. The right breast was benign. In the left breast upper inner quadrant there was a large lobulated hyperdense mass which by exam was firm and nonmobile. By ultrasonography in the 11:30 o'clock radius 4 cm from the nipple there was a mass extending to the subcutaneous tissues and measuring 4.9 cm. There was a small superficial fluid collection overlying this consistent with a hematoma. The left axilla was benign.  Biopsy of the left breast mass in question 09/30/2016 showed (SAA 17-02/04/2003) and invasive ductal carcinoma grade 3, estrogen receptor 80% positive with moderate staining intensity, progesterone  receptor negative, MIB-1 of 80%, and no HER-2 amplification, the signals ratio being 1.49 and the number per cell 2.60.  The patient's subsequent history is as detailed below  INTERVAL HISTORY: [name] returns today for follow-up and treatment of her estrogen receptor positive breast cancer accompanied by her husband.  She had her first CMF cycle on 10/17/2017.  She generally tolerated that well.  However on 10/30/2017 she had fever and rigors, was directed to the emergency room, and was admitted for evaluation and treatment of sepsis.  Her admission white cell count was 2.6 with an absolute neutrophil count of 1.2.  She was pancultured and the blood and urine cultures remain negative to this date.  She received broad spectrum intravenous antibiotics and was discharged 11/01/2017 on ciprofloxacin plus Augmentin, on which she continues, last dose due tomorrow.  Of note on the day that she had therigors she had earlier had some wound debridement which she said it caused some bleeding.  She wonders if that may have been what precipitated the transient bacteremia.  Since discharge she has been doing well, particularly doing quite a bit of shopping.  She finally was able to obtain a wound VAC which was placed this morning.  She has had no further problems with fever.  She tells me the left chest wall area is continuing to heal and she is hoping she will be able to start her radiation treatments late February.   REVIEW OF SYSTEMS: Ariah has had no unusual headaches, visual changes, mouth sores, cough, phlegm production, pleurisy, or change in bowel or bladder habits.  A detailed review of systems today was otherwise noncontributory  PAST MEDICAL HISTORY: Past Medical History:  Diagnosis Date  . Anxiety   . CAP (community acquired pneumonia)   .  Depression   . Malignant neoplasm of upper-inner quadrant of left female breast ([place]) 10/01/2016  . Personal history of chemotherapy   . Personal history of  radiation therapy   . Tobacco use     PAST SURGICAL HISTORY: Past Surgical History:  Procedure Laterality Date  . ABDOMINAL HYSTERECTOMY  2011   Laparoscopic  . AUGMENTATION MAMMAPLASTY Bilateral 03/2017  . BREAST LUMPECTOMY Left 04/05/2017  . BREAST LUMPECTOMY WITH SENTINEL LYMPH NODE BIOPSY Left 04/07/2017  . BREAST REDUCTION SURGERY Left 04/07/2017  . CHOLECYSTECTOMY    . open reduction left ankle    . SKIN GRAFT Left 06/19/2017   bil skin grafts to breasts    FAMILY HISTORY Family History  Problem Relation Age of Onset  . Heart disease Mother   . Heart disease Father   . Cancer Sister        ovarian ca  the patient's father died at the age of 21 from a myocardial infarction. The patient's mother died at the age of 53 with congestive heart failure. The patient had one brother, 2 sisters. One of her sisters was diagnosed with ovarian cancer at age 13 and died at age 48 from that disease  GYNECOLOGIC HISTORY:  No LMP recorded. Patient has had a hysterectomy. Menarche age 32, first live birth age 31. The patient is GX P1. She stopped having periods approxiately 2008. She did not take hormone replacement. She used oral contraceptives for approxi 30 years with no complications.  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 Her husband [name] is retired from [employer]. Their daughter [name] lives in [place] and is Mudlogger of the [employer] in [employer].     ADVANCED DIRECTIVES: not in place   HEALTH MAINTENANCE: Social History   Tobacco Use  . Smoking status: Former Smoker    Packs/day: 0.50    Years: 20.00    Pack years: 10.00    Last attempt to quit: 10/21/2016    Years since quitting: 1.0  . Smokeless tobacco: Never Used  Substance Use Topics  . Alcohol use: No  . Drug use: No     Colonoscopy:  PAP:  Bone density:   No Known Allergies  Current Outpatient Medications  Medication Sig Dispense  Refill  . acetaminophen (TYLENOL) 325 MG tablet Take 650 mg by mouth every 6 (six) hours as needed for moderate pain.     Marland Kitchen ALPRAZolam (XANAX XR) 1 MG 24 hr tablet Take 1 tablet (1 mg total) by mouth daily. 30 tablet 1  . amoxicillin-clavulanate (AUGMENTIN) 875-125 MG tablet Take 1 tablet by mouth 2 (two) times daily for 7 days. 14 tablet 0  . busPIRone (BUSPAR) 30 MG tablet Take 15 mg by mouth daily.     . cholestyramine (QUESTRAN) 4 g packet Take 1 packet (4 g total) by mouth 3 (three) times daily with meals. (Patient taking differently: Take 4 g by mouth 3 (three) times daily with meals as needed (reaction to chemo). ) 60 each 12  . ciprofloxacin (CIPRO) 500 MG tablet Take 1 tablet (500 mg total) by mouth 2 (two) times daily for 7 days. 14 tablet 0  . dexamethasone (DECADRON) 4 MG tablet Take 2 tablets (8 mg total) by mouth daily. Start the day after chemotherapy for 2 days. Take with food. 30 tablet 1  . diphenhydrAMINE (BENADRYL) 25 MG tablet Take 50 mg by mouth as needed for sleep.     Marland Kitchen  Echinacea-Zinc-Vitamin C (ZINC PLUS VITAMIN C/ECHINACEA PO) Take 1 tablet by mouth daily.    Marland Kitchen HYDROcodone-acetaminophen (NORCO/VICODIN) 5-325 MG tablet Take 1 tablet by mouth every 6 (six) hours as needed for moderate pain.   0  . ibuprofen (ADVIL,MOTRIN) 200 MG tablet Take 600 mg by mouth every 6 (six) hours as needed for moderate pain.     Marland Kitchen lidocaine-prilocaine (EMLA) cream Apply to affected area once 30 g 3  . LORazepam (ATIVAN) 0.5 MG tablet Take 1 tablet (0.5 mg total) by mouth every 6 (six) hours as needed (Nausea or vomiting). 30 tablet 0  . nystatin (MYCOSTATIN) 100000 UNIT/ML suspension Take 5 mLs by mouth daily as needed (ulcers).     . ondansetron (ZOFRAN) 8 MG tablet Take 1 tablet (8 mg total) by mouth 2 (two) times daily as needed for refractory nausea / vomiting. Start on day 3 after chemotherapy. 30 tablet 1  . PARoxetine (PAXIL) 40 MG tablet Take 40 mg by mouth every morning.    .  prochlorperazine (COMPAZINE) 10 MG tablet Take 1 tablet (10 mg total) by mouth every 6 (six) hours as needed (Nausea or vomiting). 30 tablet 1  . ranitidine (ZANTAC) 150 MG tablet Take 150 mg by mouth daily as needed for heartburn.     Marland Kitchen tiZANidine (ZANAFLEX) 4 MG tablet TAKE 1 TABLET BY MOUTH Bedtime     No current facility-administered medications for this visit.     OBJECTIVE: Middle-aged white woman in no acute distress  Vitals:   11/07/17 1419  BP: 139/74  Pulse: 81  Resp: 18  Temp: 98.1 F (36.7 C)  SpO2: 99%     Body mass index is 30.16 kg/m.   Filed Weights   11/07/17 1419  Weight: 204 lb 3.2 oz (92.6 kg)     ECOG FS:1 - Symptomatic but completely ambulatory   Sclerae unicteric, pupils round and equal Oropharynx clear and moist No cervical or supraclavicular adenopathy Lungs no rales or rhonchi Heart regular rate and rhythm Abd soft, nontender, positive bowel sounds MSK no focal spinal tenderness, no upper extremity lymphedema Neuro: nonfocal, well oriented, appropriate affect Breasts: She is status post bilateral mastectomies.  On the right there is a wound VAC in place.  The whole area appears flat, not erythematous, not swollen, not tender.  On the left the area of secondary intention healing has almost completely epithelialized.  There is no evidence of chest wall recurrence.  Both axillae are benign.  LAB RESULTS:  CMP     Component Value Date/Time   NA 141 11/07/2017 1404   NA 140 10/24/2017 1307   K 4.9 (H) 11/07/2017 1404   K 5.4 (H) 10/24/2017 1307   CL 103 11/07/2017 1404   CO2 28 11/07/2017 1404   CO2 29 10/24/2017 1307   GLUCOSE 118 11/07/2017 1404   GLUCOSE 103 10/24/2017 1307   BUN 14 11/07/2017 1404   BUN 18.2 10/24/2017 1307   CREATININE 0.85 11/07/2017 1404   CREATININE 0.8 10/24/2017 1307   CALCIUM 9.6 11/07/2017 1404   CALCIUM 10.3 10/24/2017 1307   PROT 6.9 11/07/2017 1404   PROT 7.5 10/24/2017 1307   ALBUMIN 3.6 11/07/2017 1404    ALBUMIN 4.0 10/24/2017 1307   AST 12 11/07/2017 1404   AST 9 10/24/2017 1307   ALT 10 11/07/2017 1404   ALT 8 10/24/2017 1307   ALKPHOS 117 11/07/2017 1404   ALKPHOS 115 10/24/2017 1307   BILITOT 0.2 11/07/2017 1404   BILITOT 0.28 10/24/2017  [place] 11/07/2017 1404   GFRAA >60 11/07/2017 1404    INo results found for: SPEP, UPEP  Lab Results  Component Value Date   WBC 11.9 (H) 11/07/2017   NEUTROABS 8.4 (H) 11/07/2017   HGB 11.7 11/07/2017   HCT 38.5 11/07/2017   MCV 86.3 11/07/2017   PLT 356 11/07/2017      Chemistry      Component Value Date/Time   NA 141 11/07/2017 1404   NA 140 10/24/2017 1307   K 4.9 (H) 11/07/2017 1404   K 5.4 (H) 10/24/2017 1307   CL 103 11/07/2017 1404   CO2 28 11/07/2017 1404   CO2 29 10/24/2017 1307   BUN 14 11/07/2017 1404   BUN 18.2 10/24/2017 1307   CREATININE 0.85 11/07/2017 1404   CREATININE 0.8 10/24/2017 1307      Component Value Date/Time   CALCIUM 9.6 11/07/2017 1404   CALCIUM 10.3 10/24/2017 1307   ALKPHOS 117 11/07/2017 1404   ALKPHOS 115 10/24/2017 1307   AST 12 11/07/2017 1404   AST 9 10/24/2017 1307   ALT 10 11/07/2017 1404   ALT 8 10/24/2017 1307   BILITOT 0.2 11/07/2017 1404   BILITOT 0.28 10/24/2017 1307       No results found for: LABCA2  No components found for: LABCA125  No results for input(s): INR in the last 168 hours.  Urinalysis    Component Value Date/Time   COLORURINE STRAW (A) 10/30/2017 2050   APPEARANCEUR CLEAR 10/30/2017 2050   LABSPEC 1.006 10/30/2017 2050   [place] 6.0 10/30/2017 2050   GLUCOSEU NEGATIVE 10/30/2017 2050   [place] NEGATIVE 10/30/2017 2050   [place] NEGATIVE 10/30/2017 2050   [place] 10/30/2017 2050   PROTEINUR NEGATIVE 10/30/2017 2050   NITRITE NEGATIVE 10/30/2017 2050   LEUKOCYTESUR NEGATIVE 10/30/2017 2050     STUDIES: Dg Chest 2 View  Result Date: 10/30/2017 CLINICAL DATA:  Patient is currently on chemotherapy for breast cancer.  Fevers and chills. EXAM: CHEST  2 VIEW COMPARISON:  CT of the chest 08/15/2017 FINDINGS: Right IJ approach injectable port terminates in the expected location of superior vena cava. Cardiomediastinal silhouette is normal. Mediastinal contours appear intact. There is no evidence of focal airspace consolidation, pleural effusion or pneumothorax. Osseous structures are without acute abnormality. Post bilateral mastectomy. IMPRESSION: No active cardiopulmonary disease. Electronically Signed   By: Fidela Salisbury M.D.   On: 10/30/2017 19:17   ELIGIBLE FOR AVAILABLE RESEARCH PROTOCOL: no  ASSESSMENT: 62 y.o. [employer], [place] woman status post left breast upper inner quadrant biopsy 09/30/2016 for a clinical T2 N0, stage II a invasive ductal carcinoma, grade 3, estrogen receptor positive, progesterone receptor negative, HER-2 not amplified, with an MIB-1 of 80%.  (1) genetics testing pending  (2) neoadjuvant chemotherapy given between 10/30/2016 and 03/12/2017 consisting of cyclophosphamide and doxorubicin in dose dense fashion 4  followed by paclitaxel 1, then Abraxane (x8?)  further treatments discontinued because of neuropathy.  (3) status post left lumpectomy with oncoplasty and right reduction mammoplasty showing a residual T2 N1 invasive ductal carcinoma, with lymphovascular invasion, but negative margins; the tumor is now HER-2 positive.  (a) postop PET scan showed no evidence of metastatic disease.  (4) adjuvant trastuzumab/Pertuzumab started 06/20/2017 (received at [facility]) started here on 07/11/2017--continue through August 2019  (a) Echo on 06/18/2017 at [facility]: LVEF 55-60%  (b) echocardiogram 09/26/2017 shows an ejection fraction in the 60-65% range.  (5) adjuvant radiation to follow  (6) anti-estrogens: Letrozole daily started  03/26/2017  (a) DEXA on 04/03/2017 demonstrated a T score of -1.3 in the right femoral neck  (b) Zometa given at [facility] on 06/20/2017  RECURRENCE: (7) Patient  developed left inner breast nodule and underwent mammogram and ultrasound on 08/06/17 showing three areas of concern at 10 o'clock, 1130 o'clock, and at 8 o'clock. Biopsy on 08/07/2017 that demonstrated at 11:30 fat necrosis, however at 10:00 it was positive for IDC, grade 3, ER+(50%), PR-(0%), Ki-67 90%, HER-2 negative (ratio 1.48).  (a) staging chest CT 08/15/2017 shows 0.5 cm left lower lobe lung nodule of uncertain significance  (8) status post left modified radical mastectomy and right simple mastectomy 08/25/2017 showing  (a) right breast, no evidence of malignancy  (b) left breast, ypT2 ypN1 invasive ductal carcinoma, grade 3, with negative margins; repeat prognostic panel: Triple negative  (9) started adjuvant chemotherapy with cyclophosphamide/methotrexate/fluorouracil [CMF] 11/10/2016, 8 cycles planned  (a) will continue trastuzumab/Pertuzumab concurrently  (b) echocardiogram 09/26/2017 showed an ejection fraction in the 60-65% range  (10) adjuvant radiation with cycles 3-5 of chemotherapy  (11) resume antiestrogens at the completion of chemotherapy   PLAN:   Zamaria has recovered well from her episode of fever and rigors.  Her counts are good.  The choice is either to postpone chemotherapy until her chest does completely healed, which likely will be 2 months or so, which means it would be too late for chemotherapy, or continue chemotherapy now.  She very much wants to proceed with chemo.  I believe it is safe to do that.  However just in case I am asking her to stop in a week from today for lab work and if I need to see her that morning I will be available.  Accordingly she is receiving her second cycle CMF today.  I think she will be ready for radiation after cycle 3 and I have requested an appointment with Dr. Sondra Come the week after that cycle.  I am going to see her with cycle 3 and subsequent cycles.  We discussed again infection prevention and she is not going to nursing home is  with her therapy dog until she completes her chemotherapy.  She knows to call for any other issues that may develop before the next visit.   Camar Guyton, [name], MD  11/07/17 3:03 PM Medical Oncology and Hematology [facility] [address] [place], [place] [address] Tel. [phone]    Fax. [phone]  This document serves as a record of services personally performed by Chauncey Cruel, MD. It was created on his behalf by Margit Banda, a trained medical scribe. The creation of this record is based on the scribe's personal observations and the provider's statements to them.   I have reviewed the above documentation for accuracy and completeness, and I agree with the above.

## 2017-10-31 NOTE — ED Notes (Signed)
Wound care nurse at the bedside

## 2017-10-31 NOTE — ED Notes (Signed)
Hospitalist at the bedside 

## 2017-10-31 NOTE — Consult Note (Signed)
[place] Nurse wound consult note Reason for Consult: non healing breast wound s/p mastectomy at [facility].  Husband has been providing wound care directed by plastic surgeon at [facility].  She initially had incisional NPWT (Prevena) on each mastectomy sites.  The right side opened up after the DC of the incisional device. She is awaiting approval for the use of traditional NPWT through her insurance.  Wound type: non healing surgical site  Pressure Injury POA: NA Measurement:3cm x 4cm x with undermining laterally aprox 3.5cm, medially 3.0cm, distally 1.0cm  Wound TJQ:ZESPQ, but dry, and non granular with epibole of the wound edges  Drainage (amount, consistency, odor) minimal, not purulent  Periwound:erythema but husband reports unchanged, it extends laterally 2cm  Dressing procedure/placement/frequency: 1/4" iodoform packing used to pack undermining and wound defect. Covered with dry dressing, secured with tape. Orders update in the computer  Discussed POC with patient and bedside nurse.  Re consult if needed, will not follow at this time. Thanks  Shonn Farruggia [employer], RN,CWOCN, CNS, [place] [phone])

## 2017-10-31 NOTE — Progress Notes (Signed)
PROGRESS NOTE  [name] BPZ:[id number] DOB: 27-Dec-1955 DOA: 10/30/2017 PCP: Chesley Noon, MD   LOS: 0 days   Brief Narrative / Interim history: 62 year old female with history of recurrent breast cancer on chemotherapy, status post bilateral mastectomies done at [facility], surgery complicated by the chronic right breast wound seen by general surgery and plastic surgery over there.  She is being followed by Dr. Jana Hakim here in our cancer center.  She presented to the emergency room with fever up to 102 at home, and has noticed that she has increased discharge from the right breast when compared to baseline.  No other systemic symptoms, no flulike illness/URI, no abdominal pain nausea or vomiting.  Assessment & Plan: Principal Problem:   Sepsis ([place]) Active Problems:   Malignant neoplasm of upper-inner quadrant of left breast in female, estrogen receptor positive ([place])   Wound infection   Sepsis due to presumed wound infection vs other -On exam one actually looks pretty clean, packing is intact, patient was started on vancomycin and cefepime since she is slightly leukopenic, continue for now -Wound cultures were sent, preliminary with gram-positive cocci in pairs and chains and few in clusters -Patient initially hypotensive into the 24M and 35T systolic, initial admission was for stepdown however blood pressure has steadily improved throughout the day and will admit her to regular floor -Blood cultures negative, no growth to date, still pending -Port site looks good, no erythema, no tenderness to palpation  Leukopenia -Not technically neutropenic, this is likely in the setting of recent chemotherapy -Continue broad-spectrum antibiotics as above, continue to monitor white count  Recurrent breast cancer -Followed by oncology as an outpatient as well as surgery at [facility] -Discussed with primary surgeon Dr. Mayer Camel at [facility] and updated her on current  hospitalization  Anxiety -Continue home medications, continue Xanax  Chemotherapy-induced anemia -Hemoglobin stable, no bleeding, continue to monitor   DVT prophylaxis: Lovenox Code Status: Full code Family Communication: no family at bedside Disposition Plan: home when ready   Consultants:   None   Procedures:   None   Antimicrobials:  Vancomycin 1/10 >>  Cefepime 1/10 >>  Subjective: - no chest pain, shortness of breath, no abdominal pain, nausea or vomiting.  Overall feeling well  Objective: Vitals:   10/31/17 1100 10/31/17 1130 10/31/17 1230 10/31/17 1300  BP: 124/68 136/61 (!) 126/53 136/73  Pulse: 91 95 95 92  Resp: 15 20 18 10   Temp:      TempSrc:      SpO2:  98%  98%  Weight:      Height:       No intake or output data in the 24 hours ending 10/31/17 1340 Filed Weights   10/30/17 1854  Weight: 91.2 kg (201 lb)    Examination:  Constitutional: NAD Eyes:  lids and conjunctivae normal ENMT: Mucous membranes are moist.  Neck: normal, supple Respiratory: clear to auscultation bilaterally, no wheezing, no crackles. Normal respiratory effort. N Cardiovascular: Regular rate and rhythm, no murmurs / rubs / gallops. No LE edema.  Abdomen: no tenderness. Bowel sounds positive.  Musculoskeletal: Status post bilateral mastectomy, left incision site healing well, right side with open 1 x 1 cm wound, packed, no significant surrounding erythema or significant drainage noted in the ED Skin: no rashes Neurologic: CN 2-12 grossly intact. Strength 5/5 in all 4.  Psychiatric: Normal judgment and insight. Alert and oriented x 3. Normal mood.    Data Reviewed: I have independently reviewed  following labs and imaging studies  Chest x-ray -negative for infiltrates EKG -sinus rhythm   CBC: Recent Labs  Lab 10/30/17 1927 10/31/17 0503  WBC 2.6* 3.3*  NEUTROABS 1.2*  --   HGB 11.6* 10.3*  HCT 37.2 33.2*  MCV 84.9 84.9  PLT 215 976   Basic Metabolic  Panel: Recent Labs  Lab 10/30/17 1927 10/31/17 0503  NA 137 136  K 3.8 4.1  CL 105 106  CO2 25 26  GLUCOSE 157* 121*  BUN 13 12  CREATININE 0.84 0.87  CALCIUM 9.2 8.2*   GFR: Estimated Creatinine Clearance: 82.7 mL/min (by C-G formula based on SCr of 0.87 mg/dL). Liver Function Tests: Recent Labs  Lab 10/30/17 1927  AST 19  ALT 12*  ALKPHOS 111  BILITOT 0.6  PROT 6.8  ALBUMIN 3.7   No results for input(s): LIPASE, AMYLASE in the last 168 hours. No results for input(s): AMMONIA in the last 168 hours. Coagulation Profile: Recent Labs  Lab 10/30/17 1927  INR 0.97   Cardiac Enzymes: No results for input(s): CKTOTAL, CKMB, CKMBINDEX, TROPONINI in the last 168 hours. BNP (last 3 results) No results for input(s): PROBNP in the last 8760 hours. HbA1C: No results for input(s): HGBA1C in the last 72 hours. CBG: No results for input(s): GLUCAP in the last 168 hours. Lipid Profile: No results for input(s): CHOL, HDL, LDLCALC, TRIG, CHOLHDL, LDLDIRECT in the last 72 hours. Thyroid Function Tests: No results for input(s): TSH, T4TOTAL, FREET4, T3FREE, THYROIDAB in the last 72 hours. Anemia Panel: No results for input(s): VITAMINB12, FOLATE, FERRITIN, TIBC, IRON, RETICCTPCT in the last 72 hours. Urine analysis:    Component Value Date/Time   COLORURINE STRAW (A) 10/30/2017 2050   APPEARANCEUR CLEAR 10/30/2017 2050   [place] 1.006 10/30/2017 2050   [place] 6.0 10/30/2017 2050   GLUCOSEU NEGATIVE 10/30/2017 2050   [place] NEGATIVE 10/30/2017 2050   [place] NEGATIVE 10/30/2017 2050   [name] 10/30/2017 2050   PROTEINUR NEGATIVE 10/30/2017 2050   NITRITE NEGATIVE 10/30/2017 2050   LEUKOCYTESUR NEGATIVE 10/30/2017 2050   Sepsis Labs: Invalid input(s): PROCALCITONIN, LACTICIDVEN  Recent Results (from the past 240 hour(s))  Culture, blood (Routine x 2)     Status: None (Preliminary result)   Collection Time: 10/30/17  7:27 PM  Result Value Ref Range Status    Specimen Description   Final    BLOOD RIGHT FOREARM Performed at [facility], [address] [place], [place] [address]    Special Requests   Final    BOTTLES DRAWN AEROBIC AND ANAEROBIC Blood Culture adequate volume   Culture PENDING  Incomplete   Report Status PENDING  Incomplete  Wound or Superficial Culture     Status: None (Preliminary result)   Collection Time: 10/30/17  9:00 PM  Result Value Ref Range Status   Specimen Description WOUND RIGHT BREAST  Final   Special Requests Normal  Final   Gram Stain   Final    FEW WBC PRESENT, PREDOMINANTLY PMN NO ORGANISMS SEEN ABUNDANT GRAM POSITIVE COCCI IN PAIRS AND CHAINS FEW GRAM POSITIVE COCCI IN CLUSTERS Performed at [facility], [place] [address], [place], [place] [address]    Culture PENDING  Incomplete   Report Status PENDING  Incomplete      Radiology Studies: Dg Chest 2 View  Result Date: 10/30/2017 CLINICAL DATA:  Patient is currently on chemotherapy for breast cancer. Fevers and chills. EXAM: CHEST  2 VIEW COMPARISON:  CT of the chest  08/15/2017 FINDINGS: Right IJ approach injectable port terminates in the expected location of superior vena cava. Cardiomediastinal silhouette is normal. Mediastinal contours appear intact. There is no evidence of focal airspace consolidation, pleural effusion or pneumothorax. Osseous structures are without acute abnormality. Post bilateral mastectomy. IMPRESSION: No active cardiopulmonary disease. Electronically Signed   By: Fidela Salisbury M.D.   On: 10/30/2017 19:17     Scheduled Meds: . ALPRAZolam  1 mg Oral Daily  . busPIRone  15 mg Oral Daily  . enoxaparin (LOVENOX) injection  40 mg Subcutaneous Q24H  . famotidine  20 mg Oral BID  . PARoxetine  40 mg Oral q morning - 10a  . tiZANidine  4 mg Oral QHS   Continuous Infusions: . sodium chloride 100 mL/hr at 10/31/17 1134  . ceFEPime (MAXIPIME) IV Stopped (10/31/17 1156)  . vancomycin 1,500 mg (10/31/17 1215)     Marzetta Board, MD, PhD [facility] Pager [phone]  If 7PM-7AM, please contact night-coverage [email or web address] Password [email or web address] 10/31/2017, 1:40 PM

## 2017-11-01 DIAGNOSIS — R509 Fever, unspecified: Secondary | ICD-10-CM

## 2017-11-01 LAB — BASIC METABOLIC PANEL
Anion gap: 4 — ABNORMAL LOW (ref 5–15)
BUN: 10 mg/dL (ref 6–20)
CALCIUM: 7.8 mg/dL — AB (ref 8.9–10.3)
CO2: 23 mmol/L (ref 22–32)
CREATININE: 0.65 mg/dL (ref 0.44–1.00)
Chloride: 110 mmol/L (ref 101–111)
GFR calc non Af Amer: >60 mL/min (ref >60)
Glucose, Bld: 158 mg/dL — ABNORMAL HIGH (ref 65–99)
Potassium: 3.6 mmol/L (ref 3.5–5.1)
SODIUM: 137 mmol/L (ref 135–145)

## 2017-11-01 LAB — CBC WITH DIFFERENTIAL/PLATELET
Basophils Absolute: 0.1 10*3/uL (ref 0.0–0.1)
Basophils Relative: 2 %
EOS ABS: 0.6 10*3/uL (ref 0.0–0.7)
EOS PCT: 19 %
HCT: 29.7 % — ABNORMAL LOW (ref 36.0–46.0)
Hemoglobin: 9.3 g/dL — ABNORMAL LOW (ref 12.0–15.0)
Lymphocytes Relative: 22 %
Lymphs Abs: 0.7 10*3/uL (ref 0.7–4.0)
MCH: 26.6 pg (ref 26.0–34.0)
MCHC: 31.3 g/dL (ref 30.0–36.0)
MCV: 85.1 fL (ref 78.0–100.0)
MONO ABS: 0.7 10*3/uL (ref 0.1–1.0)
MONOS PCT: 23 %
NEUTROS PCT: 34 %
Neutro Abs: 1.1 10*3/uL — ABNORMAL LOW (ref 1.7–7.7)
PLATELETS: 204 10*3/uL (ref 150–400)
RBC: 3.49 MIL/uL — ABNORMAL LOW (ref 3.87–5.11)
RDW: 14.5 % (ref 11.5–15.5)
WBC: 3.1 10*3/uL — ABNORMAL LOW (ref 4.0–10.5)

## 2017-11-01 MED ORDER — HEPARIN SOD (PORK) LOCK FLUSH 100 UNIT/ML IV SOLN
500.0000 [IU] | INTRAVENOUS | Status: AC | PRN
  Administered 2017-11-01: 500 [IU]

## 2017-11-01 MED ORDER — SODIUM CHLORIDE 0.9% FLUSH: 10.0000 mL | INTRAVENOUS | Status: DC | PRN

## 2017-11-01 MED ORDER — CIPROFLOXACIN HCL 500 MG PO TABS: 500.0000 mg | ORAL_TABLET | Freq: Two times a day (BID) | ORAL | 0 refills | Status: DC

## 2017-11-01 MED ORDER — SODIUM CHLORIDE 0.9% FLUSH: 10.0000 mL | Freq: Two times a day (BID) | INTRAVENOUS | Status: DC

## 2017-11-01 MED ORDER — CIPROFLOXACIN HCL 500 MG PO TABS: 500.0000 mg | ORAL_TABLET | Freq: Two times a day (BID) | ORAL | 0 refills | Status: AC

## 2017-11-01 MED ORDER — AMOXICILLIN-POT CLAVULANATE 875-125 MG PO TABS: 1.0000 | ORAL_TABLET | Freq: Two times a day (BID) | ORAL | 0 refills | Status: AC

## 2017-11-01 MED ORDER — AMOXICILLIN-POT CLAVULANATE 875-125 MG PO TABS: 1.0000 | ORAL_TABLET | Freq: Two times a day (BID) | ORAL | 0 refills | Status: DC

## 2017-11-01 NOTE — Discharge Instructions (Signed)
Follow with Dr. Jana Hakim in 5 days as scheduled  Please get a complete blood count and chemistry panel checked by your Primary MD at your next visit, and again as instructed by your Primary MD. Please get your medications reviewed and adjusted by your Primary MD.  Please request your Primary MD to go over all Hospital Tests and Procedure/Radiological results at the follow up, please get all Hospital records sent to your Prim MD by signing hospital release before you go home.  If you had Pneumonia of Lung problems at the Hospital: Please get a 2 view Chest X ray done in 6-8 weeks after hospital discharge or sooner if instructed by your Primary MD.  If you have Congestive Heart Failure: Please call your Cardiologist or Primary MD anytime you have any of the following symptoms:  1) 3 pound weight gain in 24 hours or 5 pounds in 1 week  2) shortness of breath, with or without a dry hacking cough  3) swelling in the hands, feet or stomach  4) if you have to sleep on extra pillows at night in order to breathe  Follow cardiac low salt diet and 1.5 lit/day fluid restriction.  If you have diabetes Accuchecks 4 times/day, Once in AM empty stomach and then before each meal. Log in all results and show them to your primary doctor at your next visit. If any glucose reading is under 80 or above 300 call your primary MD immediately.  If you have Seizure/Convulsions/Epilepsy: Please do not drive, operate heavy machinery, participate in activities at heights or participate in high speed sports until you have seen by Primary MD or a Neurologist and advised to do so again.  If you had Gastrointestinal Bleeding: Please ask your Primary MD to check a complete blood count within one week of discharge or at your next visit. Your endoscopic/colonoscopic biopsies that are pending at the time of discharge, will also need to followed by your Primary MD.  Get Medicines reviewed and adjusted. Please take all your  medications with you for your next visit with your Primary MD  Please request your Primary MD to go over all hospital tests and procedure/radiological results at the follow up, please ask your Primary MD to get all Hospital records sent to his/her office.  If you experience worsening of your admission symptoms, develop shortness of breath, life threatening emergency, suicidal or homicidal thoughts you must seek medical attention immediately by calling 911 or calling your MD immediately  if symptoms less severe.  You must read complete instructions/literature along with all the possible adverse reactions/side effects for all the Medicines you take and that have been prescribed to you. Take any new Medicines after you have completely understood and accpet all the possible adverse reactions/side effects.   Do not drive or operate heavy machinery when taking Pain medications.   Do not take more than prescribed Pain, Sleep and Anxiety Medications  Special Instructions: If you have smoked or chewed Tobacco  in the last 2 yrs please stop smoking, stop any regular Alcohol  and or any Recreational drug use.  Wear Seat belts while driving.  Please note You were cared for by a hospitalist during your hospital stay. If you have any questions about your discharge medications or the care you received while you were in the hospital after you are discharged, you can call the unit and asked to speak with the hospitalist on call if the hospitalist that took care of you is not available.  Once you are discharged, your primary care physician will handle any further medical issues. Please note that NO REFILLS for any discharge medications will be authorized once you are discharged, as it is imperative that you return to your primary care physician (or establish a relationship with a primary care physician if you do not have one) for your aftercare needs so that they can reassess your need for medications and monitor your  lab values.  You can reach the hospitalist office at phone [phone] or fax [phone]   If you do not have a primary care physician, you can call [phone] for a physician referral.  Activity: As tolerated with Full fall precautions use walker/cane & assistance as needed  Diet: regular  Disposition Home

## 2017-11-01 NOTE — Progress Notes (Signed)
[name] is scheduled for her next visit and chemotherapy FRI /18. I will not be surprised if her blood cultures turn positive at some point as her history is most consistent with transient bacteremia. I would discharge her on cipro and augmentin until we have sensitivities--I will follow up on resuts and change antibiotics accordingly  Thank you for your excellent care of this patient!

## 2017-11-01 NOTE — Plan of Care (Signed)
Plan of care reviewed and discussed with patient.   

## 2017-11-01 NOTE — Discharge Summary (Signed)
Physician Discharge Summary  [name] HKG:[id number] DOB: February 08, 1956 DOA: 10/30/2017  PCP: Chesley Noon, MD  Admit date: 10/30/2017 Discharge date: 11/01/2017  Admitted From: home Disposition:  home  Recommendations for Outpatient Follow-up:  1. Follow up with Dr. Jana Hakim in 5 days as scheduled 2. Ciprofloxacin and Augmentin on d/c for 7 days   Home Health: none Equipment/Devices: none  Discharge Condition: stable CODE STATUS: Full code Diet recommendation: regular  HPI: Per Dr. Alcario Drought, [name] is a 62 y.o. 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  Presents to ED with fever.  Began feeling unwell earlier this evening, fever to 102 at home.  No cough, congestion, dysuria, N/V/D, abd pain.  Has had increased discharge from the wound compared to baseline, but no redness or erythema.  Hospital Course: Sepsis due to presumed wound infection vs other -patient was admitted to the hospital with fever, hypotension, and concern for wound infection on her right breast. She was started on broad spectrum antibiotics and cultures were sent. Wound cultures with gram-positive cocci in pairs and chains and few in clusters, pending. Blood cultures negative. Clinically her sepsis resolved and she was afebrile. Oncology was consulted and Dr. Jana Hakim evaluated patient, recommending empiric coverage with Ciprofloxacin and Augmentin and will follow cultures as an outpatient for further antibiotic narrowing based on cultures. She is stable, at baseline, and in agreement for home discharge with close outpatient follow up. Her WBC in 3, she in not neutropenic.  -Port site looks good, no erythema, no tenderness to palpation Leukopenia -Not technically neutropenic, this is likely in the setting of recent chemotherapy Recurrent breast cancer -Followed by oncology as an outpatient as well as surgery at [place] with primary surgeon Dr. Mayer Camel at [facility] and updated her on current hospitalization Anxiety -Continue home medications, continue Xanax Chemotherapy-induced anemia -Hemoglobin stable, no bleeding, continue to monitor  Discharge Diagnoses:  Principal Problem:   Sepsis [facility]) Active Problems:   Malignant neoplasm of upper-inner quadrant of left breast in female, estrogen receptor positive ([place])   Wound infection   Discharge Instructions Allergies as of 11/01/2017   No Known Allergies     Medication List    TAKE these medications   acetaminophen 325 MG tablet Commonly known as:  TYLENOL Take 650 mg by mouth every 6 (six) hours as needed for moderate pain.   ALPRAZolam 1 MG 24 hr tablet Commonly known as:  XANAX XR Take 1 tablet (1 mg total) by mouth daily.   amoxicillin-clavulanate 875-125 MG tablet Commonly known as:  AUGMENTIN Take 1 tablet by mouth 2 (two) times daily for 7 days.   busPIRone 30 MG tablet Commonly known as:  BUSPAR Take 15 mg by mouth daily.   cholestyramine 4 g packet Commonly known as:  QUESTRAN Take 1 packet (4 g total) by mouth 3 (three) times daily with meals. What changed:    when to take this  reasons to take this   ciprofloxacin 500 MG tablet Commonly known as:  CIPRO Take 1 tablet (500 mg total) by mouth 2 (two) times daily for 7 days.   dexamethasone 4 MG tablet Commonly known as:  DECADRON Take 2 tablets (8 mg total) by mouth daily. Start the day after chemotherapy for 2 days. Take with food.   diphenhydrAMINE 25 MG tablet Commonly known as:  BENADRYL Take 50 mg by mouth as needed for  sleep.   HYDROcodone-acetaminophen 5-325 MG tablet Commonly known as:  NORCO/VICODIN Take 1 tablet by mouth every 6 (six) hours as needed for moderate pain.   ibuprofen 200 MG tablet Commonly known as:  ADVIL,MOTRIN Take 600 mg by mouth every 6 (six) hours as needed for moderate pain.   lidocaine-prilocaine  cream Commonly known as:  EMLA Apply to affected area once   LORazepam 0.5 MG tablet Commonly known as:  ATIVAN Take 1 tablet (0.5 mg total) by mouth every 6 (six) hours as needed (Nausea or vomiting).   nystatin 100000 UNIT/ML suspension Commonly known as:  MYCOSTATIN Take 5 mLs by mouth daily as needed (ulcers).   ondansetron 8 MG tablet Commonly known as:  ZOFRAN Take 1 tablet (8 mg total) by mouth 2 (two) times daily as needed for refractory nausea / vomiting. Start on day 3 after chemotherapy.   PARoxetine 40 MG tablet Commonly known as:  PAXIL Take 40 mg by mouth every morning.   prochlorperazine 10 MG tablet Commonly known as:  COMPAZINE Take 1 tablet (10 mg total) by mouth every 6 (six) hours as needed (Nausea or vomiting).   ranitidine 150 MG tablet Commonly known as:  ZANTAC Take 150 mg by mouth daily as needed for heartburn.   tiZANidine 4 MG tablet Commonly known as:  ZANAFLEX TAKE 1 TABLET BY MOUTH Bedtime   ZINC PLUS VITAMIN C/ECHINACEA PO Take 1 tablet by mouth daily.      Follow-up Information    [name], [name], MD. Call in 2 day(s).   Specialty:  Oncology Contact information: [place] [address] [phone]           Consultations:  Oncology   Procedures/Studies:  Dg Chest 2 View  Result Date: 10/30/2017 CLINICAL DATA:  Patient is currently on chemotherapy for breast cancer. Fevers and chills. EXAM: CHEST  2 VIEW COMPARISON:  CT of the chest 08/15/2017 FINDINGS: Right IJ approach injectable port terminates in the expected location of superior vena cava. Cardiomediastinal silhouette is normal. Mediastinal contours appear intact. There is no evidence of focal airspace consolidation, pleural effusion or pneumothorax. Osseous structures are without acute abnormality. Post bilateral mastectomy. IMPRESSION: No active cardiopulmonary disease. Electronically Signed   By: Fidela Salisbury M.D.   On: 10/30/2017 19:17      Subjective: - no chest pain, shortness of breath, no abdominal pain, nausea or vomiting.   Discharge Exam: Vitals:   10/31/17 2213 11/01/17 0604  BP: (!) 113/44 (!) 105/56  Pulse: 81 67  Resp: 16 16  Temp: 98.6 F (37 C) 98.2 F (36.8 C)  SpO2: 96% 98%   General: Pt is alert, awake, not in acute distress Cardiovascular: RRR, S1/S2 +, no rubs, no gallops Respiratory: CTA bilaterally, no wheezing, no rhonchi Abdominal: Soft, NT, ND, bowel sounds +  The results of significant diagnostics from this hospitalization (including imaging, microbiology, ancillary and laboratory) are listed below for reference.     Microbiology: Recent Results (from the past 240 hour(s))  Culture, blood (Routine x 2)     Status: None (Preliminary result)   Collection Time: 10/30/17  7:27 PM  Result Value Ref Range Status   Specimen Description BLOOD RIGHT FOREARM  Final   Special Requests   Final    BOTTLES DRAWN AEROBIC AND ANAEROBIC Blood Culture adequate volume   Culture   Final    NO GROWTH 2 DAYS Performed at [facility], [address], [place], [place] [address]  Report Status PENDING  Incomplete  Culture, blood (Routine x 2)     Status: None (Preliminary result)   Collection Time: 10/30/17  9:00 PM  Result Value Ref Range Status   Specimen Description BLOOD RIGHT CHEST PORTA CATH  Final   Special Requests   Final    BOTTLES DRAWN AEROBIC AND ANAEROBIC Blood Culture adequate volume   Culture   Final    NO GROWTH 1 DAY Performed at [facility], [address] [place], [place] [address]    Report Status PENDING  Incomplete  Wound or Superficial Culture     Status: None (Preliminary result)   Collection Time: 10/30/17  9:00 PM  Result Value Ref Range Status   Specimen Description WOUND RIGHT BREAST  Final   Special Requests Normal  Final   Gram Stain   Final    FEW WBC PRESENT, PREDOMINANTLY PMN NO ORGANISMS SEEN ABUNDANT GRAM POSITIVE COCCI IN PAIRS AND  CHAINS FEW GRAM POSITIVE COCCI IN CLUSTERS Performed at [facility], [place] [address], [place], [place] [address]    Culture PENDING  Incomplete   Report Status PENDING  Incomplete     Labs: BNP (last 3 results) No results for input(s): BNP in the last 8760 hours. Basic Metabolic Panel: Recent Labs  Lab 10/30/17 1927 10/31/17 0503 11/01/17 0900  NA 137 136 137  K 3.8 4.1 3.6  CL 105 106 110  CO2 25 26 23   GLUCOSE 157* 121* 158*  BUN 13 12 10   CREATININE 0.84 0.87 0.65  CALCIUM 9.2 8.2* 7.8*   Liver Function Tests: Recent Labs  Lab 10/30/17 1927  AST 19  ALT 12*  ALKPHOS 111  BILITOT 0.6  PROT 6.8  ALBUMIN 3.7   No results for input(s): LIPASE, AMYLASE in the last 168 hours. No results for input(s): AMMONIA in the last 168 hours. CBC: Recent Labs  Lab 10/30/17 1927 10/31/17 0503 11/01/17 0900  WBC 2.6* 3.3* 3.1*  NEUTROABS 1.2*  --  1.1*  HGB 11.6* 10.3* 9.3*  HCT 37.2 33.2* 29.7*  MCV 84.9 84.9 85.1  PLT 215 175 204   Cardiac Enzymes: No results for input(s): CKTOTAL, CKMB, CKMBINDEX, TROPONINI in the last 168 hours. BNP: Invalid input(s): POCBNP CBG: No results for input(s): GLUCAP in the last 168 hours. D-Dimer No results for input(s): DDIMER in the last 72 hours. Hgb A1c No results for input(s): HGBA1C in the last 72 hours. Lipid Profile No results for input(s): CHOL, HDL, LDLCALC, TRIG, CHOLHDL, LDLDIRECT in the last 72 hours. Thyroid function studies No results for input(s): TSH, T4TOTAL, T3FREE, THYROIDAB in the last 72 hours.  Invalid input(s): FREET3 Anemia work up No results for input(s): VITAMINB12, FOLATE, FERRITIN, TIBC, IRON, RETICCTPCT in the last 72 hours. Urinalysis    Component Value Date/Time   COLORURINE STRAW (A) 10/30/2017 2050   APPEARANCEUR CLEAR 10/30/2017 2050   [place] 1.006 10/30/2017 2050   [place] 6.0 10/30/2017 2050   GLUCOSEU NEGATIVE 10/30/2017 2050   [place] NEGATIVE 10/30/2017 2050   [place]  NEGATIVE 10/30/2017 2050   [place] 10/30/2017 2050   PROTEINUR NEGATIVE 10/30/2017 2050   NITRITE NEGATIVE 10/30/2017 2050   LEUKOCYTESUR NEGATIVE 10/30/2017 2050   Sepsis Labs Invalid input(s): PROCALCITONIN,  WBC,  LACTICIDVEN   Time coordinating discharge: 35 minutes  SIGNED:  Marzetta Board, MD  [facility] 11/01/2017, 1:03 PM Pager [phone]  If 7PM-7AM, please contact night-coverage [email or web address] Password [email or web address]

## 2017-11-02 LAB — AEROBIC CULTURE W GRAM STAIN (SUPERFICIAL SPECIMEN): Culture: NORMAL

## 2017-11-02 LAB — AEROBIC CULTURE  (SUPERFICIAL SPECIMEN): SPECIAL REQUESTS: NORMAL

## 2017-11-04 LAB — CULTURE, BLOOD (ROUTINE X 2)
Culture: NO GROWTH
SPECIAL REQUESTS: ADEQUATE

## 2017-11-05 ENCOUNTER — Other Ambulatory Visit: Payer: Self-pay | Admitting: Oncology

## 2017-11-05 LAB — CULTURE, BLOOD (ROUTINE X 2)
CULTURE: NO GROWTH
Special Requests: ADEQUATE

## 2017-11-06 ENCOUNTER — Encounter: Payer: Self-pay | Admitting: Oncology

## 2017-11-06 NOTE — Progress Notes (Signed)
Patient called and advised she left a copy of insurance denial with someone in scheduling to give to me and was told I would give her a call.  Advised patient I don't recall receiving any information on this nor do I handle denials but would be glad to research for her.  Dee in [place] had a copy of the letter that she was able to give me. I gave the copy along with the patient's contact information to Kayenta and she states she would send to billing to investigate on the back end. I advised the patient this is what is taking place and she verbalized understanding and was very appreciative for the follow up. She has my name and number for any additional financial questions or concerns and was advised that if insurance pays a portion of it and copay assistance is available, that is where I would come into play. Also advised her that if everything has been done such as appeals and whatever is requested by the insurance and it is still denied, then Rob in pharmacy may be able to apply for financial assistance for off-label use.

## 2017-11-07 ENCOUNTER — Inpatient Hospital Stay: Payer: BLUE CROSS/BLUE SHIELD

## 2017-11-07 ENCOUNTER — Inpatient Hospital Stay: Payer: BLUE CROSS/BLUE SHIELD | Attending: Oncology | Admitting: Oncology

## 2017-11-07 ENCOUNTER — Telehealth: Payer: Self-pay | Admitting: Oncology

## 2017-11-07 VITALS — BP 139/74 | HR 81 | Temp 98.1°F | Resp 18 | Ht 69.0 in | Wt 204.2 lb

## 2017-11-07 DIAGNOSIS — Z79811 Long term (current) use of aromatase inhibitors: Secondary | ICD-10-CM | POA: Diagnosis not present

## 2017-11-07 DIAGNOSIS — Z5112 Encounter for antineoplastic immunotherapy: Secondary | ICD-10-CM | POA: Insufficient documentation

## 2017-11-07 DIAGNOSIS — Z5111 Encounter for antineoplastic chemotherapy: Secondary | ICD-10-CM | POA: Insufficient documentation

## 2017-11-07 DIAGNOSIS — C50212 Malignant neoplasm of upper-inner quadrant of left female breast: Secondary | ICD-10-CM

## 2017-11-07 DIAGNOSIS — C50912 Malignant neoplasm of unspecified site of left female breast: Secondary | ICD-10-CM

## 2017-11-07 DIAGNOSIS — Z170 Estrogen receptor positive status [ER+]: Secondary | ICD-10-CM | POA: Diagnosis not present

## 2017-11-07 DIAGNOSIS — A409 Streptococcal sepsis, unspecified: Secondary | ICD-10-CM

## 2017-11-07 LAB — CBC WITH DIFFERENTIAL/PLATELET
Basophils Absolute: 0.1 10*3/uL (ref 0.0–0.1)
Basophils Relative: 1 %
EOS PCT: 6 %
Eosinophils Absolute: 0.7 10*3/uL — ABNORMAL HIGH (ref 0.0–0.5)
HCT: 38.5 % (ref 34.8–46.6)
Hemoglobin: 11.7 g/dL (ref 11.6–15.9)
LYMPHS PCT: 15 %
Lymphs Abs: 1.8 10*3/uL (ref 0.9–3.3)
MCH: 26.2 pg (ref 25.1–34.0)
MCHC: 30.4 g/dL — AB (ref 31.5–36.0)
MCV: 86.3 fL (ref 79.5–101.0)
Monocytes Absolute: 0.9 10*3/uL (ref 0.1–0.9)
Monocytes Relative: 8 %
Neutro Abs: 8.4 10*3/uL — ABNORMAL HIGH (ref 1.5–6.5)
Neutrophils Relative %: 70 %
PLATELETS: 356 10*3/uL (ref 145–400)
RBC: 4.46 MIL/uL (ref 3.70–5.45)
RDW: 15 % (ref 11.2–16.1)
WBC: 11.9 10*3/uL — AB (ref 3.9–10.3)

## 2017-11-07 LAB — COMPREHENSIVE METABOLIC PANEL
ALT: 10 U/L (ref 0–55)
AST: 12 U/L (ref 5–34)
Albumin: 3.6 g/dL (ref 3.5–5.0)
Alkaline Phosphatase: 117 U/L (ref 40–150)
Anion gap: 10 (ref 3–11)
BUN: 14 mg/dL (ref 7–26)
CHLORIDE: 103 mmol/L (ref 98–109)
CO2: 28 mmol/L (ref 22–29)
Calcium: 9.6 mg/dL (ref 8.4–10.4)
Creatinine, Ser: 0.85 mg/dL (ref 0.60–1.10)
GFR calc Af Amer: >60 mL/min (ref >60)
GLUCOSE: 118 mg/dL (ref 70–140)
POTASSIUM: 4.9 mmol/L — AB (ref 3.3–4.7)
SODIUM: 141 mmol/L (ref 136–145)
Total Bilirubin: 0.2 mg/dL (ref 0.2–1.2)
Total Protein: 6.9 g/dL (ref 6.4–8.3)

## 2017-11-07 MED ORDER — PALONOSETRON HCL INJECTION 0.25 MG/5ML
0.2500 mg | Freq: Once | INTRAVENOUS | Status: AC
  Administered 2017-11-07: 0.25 mg via INTRAVENOUS

## 2017-11-07 MED ORDER — SODIUM CHLORIDE 0.9% FLUSH
10.0000 mL | INTRAVENOUS | Status: DC | PRN
  Administered 2017-11-07: 10 mL
  Filled 2017-11-07: qty 10

## 2017-11-07 MED ORDER — SODIUM CHLORIDE 0.9 % IV SOLN
420.0000 mg | Freq: Once | INTRAVENOUS | Status: AC
  Administered 2017-11-07: 420 mg via INTRAVENOUS
  Filled 2017-11-07: qty 14

## 2017-11-07 MED ORDER — HEPARIN SOD (PORK) LOCK FLUSH 100 UNIT/ML IV SOLN
500.0000 [IU] | Freq: Once | INTRAVENOUS | Status: AC | PRN
  Administered 2017-11-07: 500 [IU]
  Filled 2017-11-07: qty 5

## 2017-11-07 MED ORDER — DEXAMETHASONE SODIUM PHOSPHATE 10 MG/ML IJ SOLN
INTRAMUSCULAR | Status: AC
  Filled 2017-11-07: qty 1

## 2017-11-07 MED ORDER — METHOTREXATE SODIUM (PF) CHEMO INJECTION 250 MG/10ML
40.1000 mg/m2 | Freq: Once | INTRAMUSCULAR | Status: AC
  Administered 2017-11-07: 85 mg via INTRAVENOUS
  Filled 2017-11-07: qty 3.4

## 2017-11-07 MED ORDER — PALONOSETRON HCL INJECTION 0.25 MG/5ML
INTRAVENOUS | Status: AC
  Filled 2017-11-07: qty 5

## 2017-11-07 MED ORDER — DEXAMETHASONE SODIUM PHOSPHATE 10 MG/ML IJ SOLN
10.0000 mg | Freq: Once | INTRAMUSCULAR | Status: AC
  Administered 2017-11-07: 10 mg via INTRAVENOUS

## 2017-11-07 MED ORDER — SODIUM CHLORIDE 0.9 % IV SOLN
Freq: Once | INTRAVENOUS | Status: AC
  Administered 2017-11-07: 16:00:00 via INTRAVENOUS

## 2017-11-07 MED ORDER — SODIUM CHLORIDE 0.9 % IV SOLN
600.0000 mg/m2 | Freq: Once | INTRAVENOUS | Status: AC
  Administered 2017-11-07: 1280 mg via INTRAVENOUS
  Filled 2017-11-07: qty 64

## 2017-11-07 MED ORDER — ACETAMINOPHEN 325 MG PO TABS
650.0000 mg | ORAL_TABLET | Freq: Once | ORAL | Status: AC
  Administered 2017-11-07: 650 mg via ORAL

## 2017-11-07 MED ORDER — DIPHENHYDRAMINE HCL 25 MG PO CAPS
ORAL_CAPSULE | ORAL | Status: AC
  Filled 2017-11-07: qty 1

## 2017-11-07 MED ORDER — SODIUM CHLORIDE 0.9 % IV SOLN
Freq: Once | INTRAVENOUS | Status: AC
  Administered 2017-11-07: 15:00:00 via INTRAVENOUS

## 2017-11-07 MED ORDER — FLUOROURACIL CHEMO INJECTION 2.5 GM/50ML
600.0000 mg/m2 | Freq: Once | INTRAVENOUS | Status: AC
  Administered 2017-11-07: 1250 mg via INTRAVENOUS
  Filled 2017-11-07: qty 25

## 2017-11-07 MED ORDER — SODIUM CHLORIDE 0.9 % IV SOLN
6.0000 mg/kg | Freq: Once | INTRAVENOUS | Status: AC
  Administered 2017-11-07: 567 mg via INTRAVENOUS
  Filled 2017-11-07: qty 27

## 2017-11-07 MED ORDER — ACETAMINOPHEN 325 MG PO TABS
ORAL_TABLET | ORAL | Status: AC
  Filled 2017-11-07: qty 2

## 2017-11-07 MED ORDER — DIPHENHYDRAMINE HCL 25 MG PO CAPS
25.0000 mg | ORAL_CAPSULE | Freq: Once | ORAL | Status: AC
  Administered 2017-11-07: 25 mg via ORAL

## 2017-11-07 NOTE — Telephone Encounter (Signed)
Gave patient AVS and calendar of upcoming January through March appointments.  °

## 2017-11-07 NOTE — Patient Instructions (Addendum)
[facility] Discharge Instructions for Patients Receiving Chemotherapy  Today you received the following chemotherapy agents Herceptin, Perjeta, Cytoxan, Methotrexate, and 5FU.  To help prevent nausea and vomiting after your treatment, we encourage you to take your nausea medication as directed.   If you develop nausea and vomiting that is not controlled by your nausea medication, call the clinic.   BELOW ARE SYMPTOMS THAT SHOULD BE REPORTED IMMEDIATELY:  *FEVER GREATER THAN 100.5 F  *CHILLS WITH OR WITHOUT FEVER  NAUSEA AND VOMITING THAT IS NOT CONTROLLED WITH YOUR NAUSEA MEDICATION  *UNUSUAL SHORTNESS OF BREATH  *UNUSUAL BRUISING OR BLEEDING  TENDERNESS IN MOUTH AND THROAT WITH OR WITHOUT PRESENCE OF ULCERS  *URINARY PROBLEMS  *BOWEL PROBLEMS  UNUSUAL RASH Items with * indicate a potential emergency and should be followed up as soon as possible.  Feel free to call the clinic should you have any questions or concerns. The clinic phone number is [phone].  Please show the [facility] at check-in to the Emergency Department and triage nurse.

## 2017-11-12 ENCOUNTER — Other Ambulatory Visit: Payer: Self-pay | Admitting: Oncology

## 2017-11-12 ENCOUNTER — Other Ambulatory Visit: Payer: Self-pay | Admitting: *Deleted

## 2017-11-12 DIAGNOSIS — C50212 Malignant neoplasm of upper-inner quadrant of left female breast: Secondary | ICD-10-CM

## 2017-11-12 DIAGNOSIS — Z170 Estrogen receptor positive status [ER+]: Principal | ICD-10-CM

## 2017-11-14 ENCOUNTER — Inpatient Hospital Stay: Payer: BLUE CROSS/BLUE SHIELD

## 2017-11-14 ENCOUNTER — Ambulatory Visit: Payer: BLUE CROSS/BLUE SHIELD

## 2017-11-14 ENCOUNTER — Other Ambulatory Visit: Payer: BLUE CROSS/BLUE SHIELD

## 2017-11-14 DIAGNOSIS — Z170 Estrogen receptor positive status [ER+]: Principal | ICD-10-CM

## 2017-11-14 DIAGNOSIS — C50912 Malignant neoplasm of unspecified site of left female breast: Secondary | ICD-10-CM

## 2017-11-14 DIAGNOSIS — C50212 Malignant neoplasm of upper-inner quadrant of left female breast: Secondary | ICD-10-CM

## 2017-11-14 DIAGNOSIS — Z5112 Encounter for antineoplastic immunotherapy: Secondary | ICD-10-CM | POA: Diagnosis not present

## 2017-11-14 LAB — CBC WITH DIFFERENTIAL/PLATELET
BASOS ABS: 0.1 10*3/uL (ref 0.0–0.1)
Basophils Relative: 2 %
EOS PCT: 6 %
Eosinophils Absolute: 0.3 10*3/uL (ref 0.0–0.5)
HCT: 38.4 % (ref 34.8–46.6)
Hemoglobin: 11.7 g/dL (ref 11.6–15.9)
LYMPHS ABS: 0.6 10*3/uL — AB (ref 0.9–3.3)
LYMPHS PCT: 11 %
MCH: 26 pg (ref 25.1–34.0)
MCHC: 30.5 g/dL — ABNORMAL LOW (ref 31.5–36.0)
MCV: 85.3 fL (ref 79.5–101.0)
MONO ABS: 0.1 10*3/uL (ref 0.1–0.9)
Monocytes Relative: 2 %
Neutro Abs: 3.9 10*3/uL (ref 1.5–6.5)
Neutrophils Relative %: 79 %
PLATELETS: 253 10*3/uL (ref 145–400)
RBC: 4.5 MIL/uL (ref 3.70–5.45)
RDW: 15 % (ref 11.2–16.1)
WBC: 5 10*3/uL (ref 3.9–10.3)

## 2017-11-14 LAB — COMPREHENSIVE METABOLIC PANEL
ALT: 6 U/L (ref 0–55)
AST: 10 U/L (ref 5–34)
Albumin: 3.9 g/dL (ref 3.5–5.0)
Alkaline Phosphatase: 100 U/L (ref 40–150)
Anion gap: 10 (ref 3–11)
BILIRUBIN TOTAL: 0.4 mg/dL (ref 0.2–1.2)
BUN: 18 mg/dL (ref 7–26)
CO2: 26 mmol/L (ref 22–29)
Calcium: 9.3 mg/dL (ref 8.4–10.4)
Chloride: 104 mmol/L (ref 98–109)
Creatinine, Ser: 0.8 mg/dL (ref 0.60–1.10)
GFR calc Af Amer: >60 mL/min (ref >60)
Glucose, Bld: 99 mg/dL (ref 70–140)
Potassium: 4.7 mmol/L (ref 3.3–4.7)
Sodium: 140 mmol/L (ref 136–145)
TOTAL PROTEIN: 7.1 g/dL (ref 6.4–8.3)

## 2017-11-25 NOTE — Progress Notes (Signed)
[name]  [phone] [phone]     ID: JAI BEAR DOB: 28-Oct-1955  MR#: [id number]  KPT#:[id number]  Patient Care Team: Chesley Noon, MD as PCP - General (Family Medicine) Excell Seltzer, MD as Consulting Physician (General Surgery) Danalee Flath, [name], MD as Consulting Physician (Oncology) Gery Pray, MD as Consulting Physician (Radiation Oncology) Collene Gobble, MD as Consulting Physician (Pulmonary Disease) Verdell Carmine, MD as Referring Physician (Internal Medicine) Larey Dresser, MD as Consulting Physician (Cardiology) Beverely Pace, MD as Referring Physician (Surgical Oncology) OTHER MD:  CHIEF COMPLAINT: estrogen receptor positive, HER-2 positive breast cancer  CURRENT TREATMENT: Adjuvant Trastuzumab/ Pertuzumab and CMF chemotherapy   BREAST CANCER HISTORY: From the original intake note:  "[name]" had a fall while traveling living to [place] in their RV and injure her left upper arm and  her left breast . Approximately 3 weeks later she noted a mass in the left breast upper inner quadrant. It was somewhat tender. She brought this to medical attention and on 09/27/2016 underwent bilateral diagnostic mammography with tomography [facility] The breast density was category B. The right breast was benign. In the left breast upper inner quadrant there was a large lobulated hyperdense mass which by exam was firm and nonmobile. By ultrasonography in the 11:30 o'clock radius 4 cm from the nipple there was a mass extending to the subcutaneous tissues and measuring 4.9 cm. There was a small superficial fluid collection overlying this consistent with a hematoma. The left axilla was benign.  Biopsy of the left breast mass in question 09/30/2016 showed (SAA 17-02/04/2003) and invasive ductal carcinoma grade 3, estrogen receptor 80% positive with moderate staining intensity, progesterone  receptor negative, MIB-1 of 80%, and no HER-2 amplification, the signals ratio being 1.49 and the number per cell 2.60.  The patient's subsequent history is as detailed below  INTERVAL HISTORY: [name] returns today for follow-up and treatment of her estrogen receptor positive breast cancer accompanied by her husband.  Her most recent CMF cycle was on 11/07/17, which she tolerated well.   She also continues to receive anti-HER-2 immunotherapy with trastuzumab and Pertuzumab, which she has tolerated well without any issues except for a diarrhea a couple of days, moderate to mild.   REVIEW OF SYSTEMS: [name] reports that she has had good energy. She is staying active. She has company coming next week and she has been spring cleaning. She reports that she had diarrhea for 2 days in the middle of her treatments and she notes that she had 2 loose stools at a time in a day and she reports that the imodium alleviated her symptoms. She notes that she has been emotional lately and reports that she typically cries and prays, she is taking Paxil and 1 mg Xanax to aid with this. She is about to have a grandchild and she hopes that she has her wound vac removed next week. She is hoping to be able to fly to visit her daughter after the birth of her grandchild in April. She is ready to start radiation therapy as soon as she can. Her hair is growing back and she is pleased with this. She denies unusual headaches, visual changes, nausea, vomiting, or dizziness. There has been no unusual cough, phlegm production, or pleurisy. This been no change in bowel or bladder habits. She denies unexplained fatigue or unexplained weight loss, bleeding, rash, or fever. A detailed review of systems was otherwise stable.  PAST MEDICAL HISTORY: Past Medical History:  Diagnosis Date  . Anxiety   . CAP (community acquired pneumonia)   . Depression   . Malignant neoplasm of upper-inner quadrant of left female breast ([place]) 10/01/2016   . Personal history of chemotherapy   . Personal history of radiation therapy   . Tobacco use     PAST SURGICAL HISTORY: Past Surgical History:  Procedure Laterality Date  . ABDOMINAL HYSTERECTOMY  2011   Laparoscopic  . AUGMENTATION MAMMAPLASTY Bilateral 03/2017  . BREAST LUMPECTOMY Left 04/05/2017  . BREAST LUMPECTOMY WITH SENTINEL LYMPH NODE BIOPSY Left 04/07/2017  . BREAST REDUCTION SURGERY Left 04/07/2017  . CHOLECYSTECTOMY    . open reduction left ankle    . SKIN GRAFT Left 06/19/2017   bil skin grafts to breasts    FAMILY HISTORY Family History  Problem Relation Age of Onset  . Heart disease Mother   . Heart disease Father   . Cancer Sister        ovarian ca  the patient's father died at the age of 31 from a myocardial infarction. The patient's mother died at the age of 27 with congestive heart failure. The patient had one brother, 2 sisters. One of her sisters was diagnosed with ovarian cancer at age 42 and died at age 82 from that disease  GYNECOLOGIC HISTORY:  No LMP recorded. Patient has had a hysterectomy. Menarche age 76, first live birth age 53. The patient is GX P1. She stopped having periods approxiately 2008. She did not take hormone replacement. She used oral contraceptives for approxi 30 years with no complications.  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 Her husband [name] is retired from [employer]. Their daughter [name] lives in [place] and is Mudlogger of the [employer] in [employer].     ADVANCED DIRECTIVES: not in place   HEALTH MAINTENANCE: Social History   Tobacco Use  . Smoking status: Former Smoker    Packs/day: 0.50    Years: 20.00    Pack years: 10.00    Last attempt to quit: 10/21/2016    Years since quitting: 1.1  . Smokeless tobacco: Never Used  Substance Use Topics  . Alcohol use: No  . Drug use: No     Colonoscopy:  PAP:  Bone density:   No Known  Allergies  Current Outpatient Medications  Medication Sig Dispense Refill  . acetaminophen (TYLENOL) 325 MG tablet Take 650 mg by mouth every 6 (six) hours as needed for moderate pain.     Marland Kitchen ALPRAZolam (XANAX XR) 1 MG 24 hr tablet TAKE 1 TABLET BY MOUTH EVERY DAY 30 tablet 1  . busPIRone (BUSPAR) 30 MG tablet Take 15 mg by mouth daily.     . cholestyramine (QUESTRAN) 4 g packet Take 1 packet (4 g total) by mouth 3 (three) times daily with meals. (Patient taking differently: Take 4 g by mouth 3 (three) times daily with meals as needed (reaction to chemo). ) 60 each 12  . dexamethasone (DECADRON) 4 MG tablet Take 2 tablets (8 mg total) by mouth daily. Start the day after chemotherapy for 2 days. Take with food. 30 tablet 1  . diphenhydrAMINE (BENADRYL) 25 MG tablet Take 50 mg by mouth as needed for sleep.     Marlyn Corporal C (ZINC PLUS VITAMIN C/ECHINACEA PO) Take 1 tablet by mouth daily.    Marland Kitchen ibuprofen (ADVIL,MOTRIN) 200 MG tablet Take 600  mg by mouth every 6 (six) hours as needed for moderate pain.     Marland Kitchen lidocaine-prilocaine (EMLA) cream Apply to affected area once 30 g 3  . LORazepam (ATIVAN) 0.5 MG tablet Take 1 tablet (0.5 mg total) by mouth every 6 (six) hours as needed (Nausea or vomiting). 30 tablet 0  . nystatin (MYCOSTATIN) 100000 UNIT/ML suspension Take 5 mLs by mouth daily as needed (ulcers).     . ondansetron (ZOFRAN) 8 MG tablet Take 1 tablet (8 mg total) by mouth 2 (two) times daily as needed for refractory nausea / vomiting. Start on day 3 after chemotherapy. 30 tablet 1  . PARoxetine (PAXIL) 40 MG tablet Take 40 mg by mouth every morning.    . prochlorperazine (COMPAZINE) 10 MG tablet Take 1 tablet (10 mg total) by mouth every 6 (six) hours as needed (Nausea or vomiting). 30 tablet 1  . ranitidine (ZANTAC) 150 MG tablet Take 150 mg by mouth daily as needed for heartburn.     Marland Kitchen tiZANidine (ZANAFLEX) 4 MG tablet TAKE 1 TABLET BY MOUTH Bedtime     No current  facility-administered medications for this visit.      OBJECTIVE: Middle-aged white woman who appears well  Vitals:   11/28/17 0925  BP: (!) 145/85  Pulse: 90  Resp: 18  Temp: 99.1 F (37.3 C)  SpO2: 99%     Body mass index is 30.7 kg/m.   Filed Weights   11/28/17 0925  Weight: 207 lb 14.4 oz (94.3 kg)     ECOG FS:1 - Symptomatic but completely ambulatory   Sclerae unicteric, EOMs intact Oropharynx clear and moist No cervical or supraclavicular adenopathy Lungs no rales or rhonchi Heart regular rate and rhythm Abd soft, nontender, positive bowel sounds MSK no focal spinal tenderness, no upper extremity lymphedema Neuro: nonfocal, well oriented, appropriate affect Breasts: Status post bilateral mastectomies with wound VAC on the right.  On the left of the chest wall has healed very nicely.  I expect the right wound VAC will be removed in the coming week  LAB RESULTS:  CMP     Component Value Date/Time   NA 140 11/14/2017 0803   NA 140 10/24/2017 1307   K 4.7 11/14/2017 0803   K 5.4 (H) 10/24/2017 1307   CL 104 11/14/2017 0803   CO2 26 11/14/2017 0803   CO2 29 10/24/2017 1307   GLUCOSE 99 11/14/2017 0803   GLUCOSE 103 10/24/2017 1307   BUN 18 11/14/2017 0803   BUN 18.2 10/24/2017 1307   CREATININE 0.80 11/14/2017 0803   CREATININE 0.8 10/24/2017 1307   CALCIUM 9.3 11/14/2017 0803   CALCIUM 10.3 10/24/2017 1307   PROT 7.1 11/14/2017 0803   PROT 7.5 10/24/2017 1307   ALBUMIN 3.9 11/14/2017 0803   ALBUMIN 4.0 10/24/2017 1307   AST 10 11/14/2017 0803   AST 9 10/24/2017 1307   ALT 6 11/14/2017 0803   ALT 8 10/24/2017 1307   ALKPHOS 100 11/14/2017 0803   ALKPHOS 115 10/24/2017 1307   BILITOT 0.4 11/14/2017 0803   BILITOT 0.28 10/24/2017 1307   GFRNONAA >60 11/14/2017 0803   GFRAA >60 11/14/2017 0803    INo results found for: SPEP, UPEP  Lab Results  Component Value Date   WBC 6.2 11/28/2017   NEUTROABS 3.8 11/28/2017   HGB 12.3 11/28/2017   HCT 38.1  11/28/2017   MCV 81.0 11/28/2017   PLT 319 11/28/2017      Chemistry      Component  Value Date/Time   NA 140 11/14/2017 0803   NA 140 10/24/2017 1307   K 4.7 11/14/2017 0803   K 5.4 (H) 10/24/2017 1307   CL 104 11/14/2017 0803   CO2 26 11/14/2017 0803   CO2 29 10/24/2017 1307   BUN 18 11/14/2017 0803   BUN 18.2 10/24/2017 1307   CREATININE 0.80 11/14/2017 0803   CREATININE 0.8 10/24/2017 1307      Component Value Date/Time   CALCIUM 9.3 11/14/2017 0803   CALCIUM 10.3 10/24/2017 1307   ALKPHOS 100 11/14/2017 0803   ALKPHOS 115 10/24/2017 1307   AST 10 11/14/2017 0803   AST 9 10/24/2017 1307   ALT 6 11/14/2017 0803   ALT 8 10/24/2017 1307   BILITOT 0.4 11/14/2017 0803   BILITOT 0.28 10/24/2017 1307       No results found for: LABCA2  No components found for: LABCA125  No results for input(s): INR in the last 168 hours.  Urinalysis    Component Value Date/Time   COLORURINE STRAW (A) 10/30/2017 2050   APPEARANCEUR CLEAR 10/30/2017 2050   LABSPEC 1.006 10/30/2017 2050   [place] 6.0 10/30/2017 2050   GLUCOSEU NEGATIVE 10/30/2017 2050   [place] NEGATIVE 10/30/2017 2050   [place] NEGATIVE 10/30/2017 2050   [place] 10/30/2017 2050   PROTEINUR NEGATIVE 10/30/2017 2050   NITRITE NEGATIVE 10/30/2017 2050   LEUKOCYTESUR NEGATIVE 10/30/2017 2050     STUDIES: Dg Chest 2 View  Result Date: 10/30/2017 CLINICAL DATA:  Patient is currently on chemotherapy for breast cancer. Fevers and chills. EXAM: CHEST  2 VIEW COMPARISON:  CT of the chest 08/15/2017 FINDINGS: Right IJ approach injectable port terminates in the expected location of superior vena cava. Cardiomediastinal silhouette is normal. Mediastinal contours appear intact. There is no evidence of focal airspace consolidation, pleural effusion or pneumothorax. Osseous structures are without acute abnormality. Post bilateral mastectomy. IMPRESSION: No active cardiopulmonary disease. Electronically Signed   By:  Fidela Salisbury M.D.   On: 10/30/2017 19:17   ELIGIBLE FOR AVAILABLE RESEARCH PROTOCOL: no  ASSESSMENT: 62 y.o. [employer], [place] woman status post left breast upper inner quadrant biopsy 09/30/2016 for a clinical T2 N0, stage II a invasive ductal carcinoma, grade 3, estrogen receptor positive, progesterone receptor negative, HER-2 not amplified, with an MIB-1 of 80%.  (1) genetics testing pending  (2) neoadjuvant chemotherapy given between 10/30/2016 and 03/12/2017 consisting of cyclophosphamide and doxorubicin in dose dense fashion 4  followed by paclitaxel 1, then Abraxane (x8?)  further treatments discontinued because of neuropathy.  (3) status post left lumpectomy with oncoplasty and right reduction mammoplasty showing a residual T2 N1 invasive ductal carcinoma, with lymphovascular invasion, but negative margins; the tumor is now HER-2 positive.  (a) postop PET scan showed no evidence of metastatic disease.  (4) adjuvant trastuzumab/Pertuzumab started 06/20/2017 (received at [facility]) started here on 07/11/2017--continue through August 2019  (a) Echo on 06/18/2017 at [place]: LVEF 55-60%  (b) echocardiogram 09/26/2017 shows an ejection fraction in the 60-65% range.  (5) adjuvant radiation to follow  (6) anti-estrogens: Letrozole daily started 03/26/2017  (a) DEXA on 04/03/2017 demonstrated a T score of -1.3 in the right femoral neck  (b) Zometa given at [facility] on 06/20/2017  RECURRENCE: (7) Patient developed left inner breast nodule and underwent mammogram and ultrasound on 08/06/17 showing three areas of concern at 10 o'clock, 1130 o'clock, and at 8 o'clock. Biopsy on 08/07/2017 that demonstrated at 11:30 fat necrosis, however at 10:00 it was positive for IDC, grade 3,  ER+(50%), PR-(0%), Ki-67 90%, HER-2 negative (ratio 1.48).  (a) staging chest CT 08/15/2017 shows 0.5 cm left lower lobe lung nodule of uncertain significance  (8) status post left modified radical mastectomy and right  simple mastectomy 08/25/2017 showing  (a) right breast, no evidence of malignancy  (b) left breast, ypT2 ypN1 invasive ductal carcinoma, grade 3, with negative margins; repeat prognostic panel: Triple negative  (9) started adjuvant chemotherapy with cyclophosphamide/methotrexate/fluorouracil [CMF] 11/10/2016, 8 cycles planned  (a) will continue trastuzumab/Pertuzumab concurrently  (b) echocardiogram 09/26/2017 showed an ejection fraction in the 60-65% range  (10) adjuvant radiation with cycles 3-5 of chemotherapy  (11) resume antiestrogens at the completion of chemotherapy   PLAN:   [name] is tolerating CMF remarkably well and the plan is to continue the third of 8 cycles today.  With cycles 4 and 5 we can easily give her concurrent radiation.  I have alerted Dr. Dorathy Daft regarding that.  If so we will eliminate the methotrexate from those 2 cycles.  Note that after her April 12 treatments she would like to go be with her daughter and upcoming grand baby and she will continue her chemotherapy there.  We will make those arrangements for her at that time  I commended how well she is doing and also encouraged her to continue to exercise on a regular basis which I think will do more for her mood than anything she knows to call for any other problems that may develop before the next visit.  Modine Oppenheimer, [name], MD  11/28/17 9:59 AM Medical Oncology and Hematology [facility] [address] [place], [place] [address] Tel. [phone]    Fax. [phone]    This document serves as a record of services personally performed by Lurline Del, MD. It was created on his behalf by Steva Colder, a trained medical scribe. The creation of this record is based on the scribe's personal observations and the provider's statements to them.   I have reviewed the above documentation for accuracy and completeness, and I agree with the above.

## 2017-11-28 ENCOUNTER — Inpatient Hospital Stay: Payer: BLUE CROSS/BLUE SHIELD | Attending: Oncology

## 2017-11-28 ENCOUNTER — Inpatient Hospital Stay: Payer: BLUE CROSS/BLUE SHIELD

## 2017-11-28 ENCOUNTER — Inpatient Hospital Stay: Payer: BLUE CROSS/BLUE SHIELD | Admitting: Oncology

## 2017-11-28 ENCOUNTER — Telehealth: Payer: Self-pay | Admitting: Oncology

## 2017-11-28 VITALS — BP 145/85 | HR 90 | Temp 99.1°F | Resp 18 | Ht 69.0 in | Wt 207.9 lb

## 2017-11-28 DIAGNOSIS — Z170 Estrogen receptor positive status [ER+]: Secondary | ICD-10-CM

## 2017-11-28 DIAGNOSIS — C50212 Malignant neoplasm of upper-inner quadrant of left female breast: Secondary | ICD-10-CM

## 2017-11-28 DIAGNOSIS — Z5111 Encounter for antineoplastic chemotherapy: Secondary | ICD-10-CM | POA: Insufficient documentation

## 2017-11-28 DIAGNOSIS — C50912 Malignant neoplasm of unspecified site of left female breast: Secondary | ICD-10-CM

## 2017-11-28 DIAGNOSIS — Z5112 Encounter for antineoplastic immunotherapy: Secondary | ICD-10-CM | POA: Insufficient documentation

## 2017-11-28 LAB — CBC WITH DIFFERENTIAL/PLATELET
BASOS ABS: 0.1 10*3/uL (ref 0.0–0.1)
Basophils Relative: 2 %
EOS ABS: 0.6 10*3/uL — AB (ref 0.0–0.5)
EOS PCT: 10 %
HCT: 38.1 % (ref 34.8–46.6)
Hemoglobin: 12.3 g/dL (ref 11.6–15.9)
Lymphocytes Relative: 17 %
Lymphs Abs: 1.1 10*3/uL (ref 0.9–3.3)
MCH: 26.1 pg (ref 25.1–34.0)
MCHC: 32.2 g/dL (ref 31.5–36.0)
MCV: 81 fL (ref 79.5–101.0)
MONO ABS: 0.5 10*3/uL (ref 0.1–0.9)
Monocytes Relative: 9 %
Neutro Abs: 3.8 10*3/uL (ref 1.5–6.5)
Neutrophils Relative %: 62 %
PLATELETS: 319 10*3/uL (ref 145–400)
RBC: 4.7 MIL/uL (ref 3.70–5.45)
RDW: 17 % — AB (ref 11.2–14.5)
WBC: 6.2 10*3/uL (ref 3.9–10.3)

## 2017-11-28 LAB — COMPREHENSIVE METABOLIC PANEL
ALK PHOS: 134 U/L (ref 40–150)
ALT: 10 U/L (ref 0–55)
AST: 12 U/L (ref 5–34)
Albumin: 3.9 g/dL (ref 3.5–5.0)
Anion gap: 10 (ref 3–11)
BILIRUBIN TOTAL: 0.3 mg/dL (ref 0.2–1.2)
BUN: 11 mg/dL (ref 7–26)
CALCIUM: 9.6 mg/dL (ref 8.4–10.4)
CO2: 24 mmol/L (ref 22–29)
CREATININE: 0.83 mg/dL (ref 0.60–1.10)
Chloride: 106 mmol/L (ref 98–109)
GFR calc Af Amer: >60 mL/min (ref >60)
Glucose, Bld: 121 mg/dL (ref 70–140)
Potassium: 4.6 mmol/L (ref 3.5–5.1)
Sodium: 140 mmol/L (ref 136–145)
Total Protein: 6.8 g/dL (ref 6.4–8.3)

## 2017-11-28 MED ORDER — PALONOSETRON HCL INJECTION 0.25 MG/5ML
INTRAVENOUS | Status: AC
  Filled 2017-11-28: qty 5

## 2017-11-28 MED ORDER — HEPARIN SOD (PORK) LOCK FLUSH 100 UNIT/ML IV SOLN
500.0000 [IU] | Freq: Once | INTRAVENOUS | Status: DC | PRN
  Filled 2017-11-28: qty 5

## 2017-11-28 MED ORDER — HEPARIN SOD (PORK) LOCK FLUSH 100 UNIT/ML IV SOLN
500.0000 [IU] | Freq: Once | INTRAVENOUS | Status: AC | PRN
  Administered 2017-11-28: 500 [IU]
  Filled 2017-11-28: qty 5

## 2017-11-28 MED ORDER — FLUOROURACIL CHEMO INJECTION 2.5 GM/50ML
600.0000 mg/m2 | Freq: Once | INTRAVENOUS | Status: AC
  Administered 2017-11-28: 1250 mg via INTRAVENOUS
  Filled 2017-11-28: qty 25

## 2017-11-28 MED ORDER — PALONOSETRON HCL INJECTION 0.25 MG/5ML
0.2500 mg | Freq: Once | INTRAVENOUS | Status: AC
  Administered 2017-11-28: 0.25 mg via INTRAVENOUS

## 2017-11-28 MED ORDER — METHOTREXATE SODIUM (PF) CHEMO INJECTION 250 MG/10ML
85.0000 mg | Freq: Once | INTRAMUSCULAR | Status: AC
  Administered 2017-11-28: 85 mg via INTRAVENOUS
  Filled 2017-11-28: qty 3.4

## 2017-11-28 MED ORDER — DEXAMETHASONE SODIUM PHOSPHATE 10 MG/ML IJ SOLN
INTRAMUSCULAR | Status: AC
  Filled 2017-11-28: qty 1

## 2017-11-28 MED ORDER — DIPHENHYDRAMINE HCL 25 MG PO CAPS
ORAL_CAPSULE | ORAL | Status: AC
  Filled 2017-11-28: qty 1

## 2017-11-28 MED ORDER — SODIUM CHLORIDE 0.9% FLUSH
10.0000 mL | INTRAVENOUS | Status: DC | PRN
  Filled 2017-11-28: qty 10

## 2017-11-28 MED ORDER — ACETAMINOPHEN 325 MG PO TABS
650.0000 mg | ORAL_TABLET | Freq: Once | ORAL | Status: AC
  Administered 2017-11-28: 650 mg via ORAL

## 2017-11-28 MED ORDER — TRASTUZUMAB CHEMO 150 MG IV SOLR
6.0000 mg/kg | Freq: Once | INTRAVENOUS | Status: AC
  Administered 2017-11-28: 567 mg via INTRAVENOUS
  Filled 2017-11-28: qty 27

## 2017-11-28 MED ORDER — ACETAMINOPHEN 325 MG PO TABS
ORAL_TABLET | ORAL | Status: AC
  Filled 2017-11-28: qty 2

## 2017-11-28 MED ORDER — SODIUM CHLORIDE 0.9% FLUSH
10.0000 mL | INTRAVENOUS | Status: DC | PRN
  Administered 2017-11-28: 10 mL
  Filled 2017-11-28: qty 10

## 2017-11-28 MED ORDER — DEXAMETHASONE SODIUM PHOSPHATE 10 MG/ML IJ SOLN
10.0000 mg | Freq: Once | INTRAMUSCULAR | Status: AC
  Administered 2017-11-28: 10 mg via INTRAVENOUS

## 2017-11-28 MED ORDER — SODIUM CHLORIDE 0.9 % IV SOLN
420.0000 mg | Freq: Once | INTRAVENOUS | Status: AC
  Administered 2017-11-28: 420 mg via INTRAVENOUS
  Filled 2017-11-28: qty 14

## 2017-11-28 MED ORDER — SODIUM CHLORIDE 0.9 % IV SOLN: Freq: Once | INTRAVENOUS | Status: DC

## 2017-11-28 MED ORDER — SODIUM CHLORIDE 0.9 % IV SOLN
600.0000 mg/m2 | Freq: Once | INTRAVENOUS | Status: AC
  Administered 2017-11-28: 1280 mg via INTRAVENOUS
  Filled 2017-11-28: qty 64

## 2017-11-28 MED ORDER — SODIUM CHLORIDE 0.9 % IV SOLN
Freq: Once | INTRAVENOUS | Status: AC
  Administered 2017-11-28: 11:00:00 via INTRAVENOUS

## 2017-11-28 MED ORDER — ACETAMINOPHEN 325 MG PO TABS
ORAL_TABLET | ORAL | Status: AC
Start: 2017-11-28 — End: 2017-11-28
  Filled 2017-11-28: qty 1

## 2017-11-28 MED ORDER — DIPHENHYDRAMINE HCL 25 MG PO CAPS
25.0000 mg | ORAL_CAPSULE | Freq: Once | ORAL | Status: AC
  Administered 2017-11-28: 25 mg via ORAL

## 2017-11-28 NOTE — Addendum Note (Signed)
Addended by: Paulla Dolly on: 11/28/2017 01:27 PM   Modules accepted: Orders

## 2017-11-28 NOTE — Patient Instructions (Addendum)
[facility] Discharge Instructions for Patients Receiving Chemotherapy  Today you received the following chemotherapy agents:  Cytoxan, Methotrexate, Fluorouracil, Herceptin, Perjeta  To help prevent nausea and vomiting after your treatment, we encourage you to take your nausea medication as prescribed.   If you develop nausea and vomiting that is not controlled by your nausea medication, call the clinic.   BELOW ARE SYMPTOMS THAT SHOULD BE REPORTED IMMEDIATELY:  *FEVER GREATER THAN 100.5 F  *CHILLS WITH OR WITHOUT FEVER  NAUSEA AND VOMITING THAT IS NOT CONTROLLED WITH YOUR NAUSEA MEDICATION  *UNUSUAL SHORTNESS OF BREATH  *UNUSUAL BRUISING OR BLEEDING  TENDERNESS IN MOUTH AND THROAT WITH OR WITHOUT PRESENCE OF ULCERS  *URINARY PROBLEMS  *BOWEL PROBLEMS  UNUSUAL RASH Items with * indicate a potential emergency and should be followed up as soon as possible.  Feel free to call the clinic should you have any questions or concerns. The clinic phone number is [phone].  Please show the [facility] at check-in to the Emergency Department and triage nurse.

## 2017-11-28 NOTE — Addendum Note (Signed)
Addended by: Paulla Dolly on: 11/28/2017 11:56 AM   Modules accepted: Orders

## 2017-11-28 NOTE — Telephone Encounter (Signed)
Scheduled appts per 2/8 los - patient did not want avs or calendar.

## 2017-12-01 ENCOUNTER — Encounter: Payer: Self-pay | Admitting: Radiation Oncology

## 2017-12-05 ENCOUNTER — Ambulatory Visit: Payer: BLUE CROSS/BLUE SHIELD

## 2017-12-05 ENCOUNTER — Other Ambulatory Visit: Payer: BLUE CROSS/BLUE SHIELD

## 2017-12-05 NOTE — Progress Notes (Addendum)
Location of Breast Cancer: Left Breast UIQ Poorly Differentiated Invasive Ductal Carcinoma  Histology per Pathology Report:   08/07/17 Diagnosis 1. Breast, left, needle core biopsy, 11:30 o'clock position - FAT NECROSIS AND REACTIVE CHANGES. - NO MALIGNANCY IDENTIFIED. 2. Breast, left, needle core biopsy, 10:00 o'clock position - INVASIVE DUCTAL CARCINOMA, SEE COMMENT. - LYMPHOVASCULAR INVASION PRESENT.  04/07/17 1. Breast, left, lumpectomy:  - Metaplastic carcinoma, poorly differentiated (tubular differentiation score 3/3, nuclear pleomorphism score 3/3, mitotic rate score 3/3; total Nottingham score 9/9), 45 x 34 x 32 mm. - Carcinoma shows metaplastic squamous differentiation. - Ductal carcinoma in-situ, high grade, comedo type with comedo necrosis. - Margins are negative for carcinoma, closest are posterior and superior at 0.5 mm. - Lymphovascular invasion is present. - Biopsy site changes. - TNM stage pT2 ([id number] M-not applicable; AJCC stage IIB. - Please see synoptic report below.  2. Sentinel lymph node, left axilla, excision:  - Two of three lymph nodes, positive for metastatic carcinoma (2/3). - One macrometastasis (2.5 mmm) and one micrometastasis (1 mm). - No extranodal extension present.  3. Breast, left, oncoplastic breast reduction:  - Benign skin and breast parenchyma, 77.7 gm.  4. Breast, right, reduction mammoplasty:  - Benign skin and breast parenchyma, 152.6 gm.  5. Breast, left inferior/anterior margin, excision:  - Benign breast parenchyma and one benign intramammary lymph node (0/1).  09/30/16 Diagnosis Breast, left, needle core biopsy, 11:30 o'clock - INVASIVE DUCTAL CARCINOMA. - HIGH GRADE DUCTAL CARCINOMA IN SITU. - SEE COMMENT.  Receptor Status: from 10/18 surgery: ER(50%), PR(0%), Her2-neu (negative), Ki-(90%), ER(20%, PR (0%), Her2-neu (amplified), Ki-()  Did patient present with symptoms (if so, please note symptoms) or was this found on  screening mammography?: Patient had a fall while traveling living to [place] in their RV and injure her left upper arm and  her left breast . Approximately 3 weeks later she noted a mass in the left breast upper inner quadrant.   Past/Anticipated interventions by surgeon, if any:   08/07/17 - Left breast biopsy 06/19/17 - bil skin grafts to breasts. 04/07/17 - LEFT ONCOPLASTIC BREAST REDUCTION AND RIGHT BREAST REDUCTION (Bilateral) and LEFT BREAST ONCOPLASTIC LUMPECTOMY WITH LEFT SENTINEL NODE BIOPSY (Left)  08/25/17 Double Mastectomy at [facility] a specialist in oncology breast surgery   Past/Anticipated interventions by medical oncology, if any: adjuvant trastuzumab/Pertuzumab started 06/20/2017 (received at [facility]) started here on 07/11/2017--continue through August 2019. Started adjuvant chemotherapy with cyclophosphamide/methotrexate/fluorouracil [CMF] 11/10/2016, 8 cycles planned. Will continue trastuzumab/Pertuzumab concurrently. Resume antiestrogens at the completion of chemotherapy.   Lymphedema issues, if any:  She reports occasional edema in her left arm, and she will massage it and that helps.   Pain issues, if any:  no   SAFETY ISSUES:  Prior radiation? Had mark and start at [facility]   Pacemaker/ICD? no  Possible current pregnancy?no  Is the patient on methotrexate? no  Current Complaints / other details:  Cleared for radiation on 07/25/17 by Dr. Harl Bowie.  BP (!) 146/68   Pulse 85   Temp 98.6 F (37 C)   Ht _0  (1.753 m)   Wt 209 lb 9.6 oz (95.1 kg)   SpO2 97% Comment: room air  BMI 30.95 kg/m    Wt Readings from Last 3 Encounters:  12/17/17 209 lb 9.6 oz (95.1 kg)  11/28/17 207 lb 14.4 oz (94.3 kg)  11/07/17 204 lb 3.2 oz (92.6 kg)

## 2017-12-17 ENCOUNTER — Encounter: Payer: Self-pay | Admitting: Radiation Oncology

## 2017-12-17 ENCOUNTER — Ambulatory Visit
Admission: RE | Admit: 2017-12-17 | Discharge: 2017-12-17 | Disposition: A | Discharge status: Home / Self Care | Payer: BLUE CROSS/BLUE SHIELD | Source: Ambulatory Visit | Attending: Radiation Oncology | Admitting: Radiation Oncology

## 2017-12-17 VITALS — BP 146/68 | HR 85 | Temp 98.6°F | Ht 69.0 in | Wt 209.6 lb

## 2017-12-17 DIAGNOSIS — C50212 Malignant neoplasm of upper-inner quadrant of left female breast: Secondary | ICD-10-CM | POA: Diagnosis present

## 2017-12-17 DIAGNOSIS — Z9221 Personal history of antineoplastic chemotherapy: Secondary | ICD-10-CM | POA: Insufficient documentation

## 2017-12-17 DIAGNOSIS — C50912 Malignant neoplasm of unspecified site of left female breast: Secondary | ICD-10-CM

## 2017-12-17 DIAGNOSIS — Z9013 Acquired absence of bilateral breasts and nipples: Secondary | ICD-10-CM | POA: Diagnosis not present

## 2017-12-17 DIAGNOSIS — C773 Secondary and unspecified malignant neoplasm of axilla and upper limb lymph nodes: Secondary | ICD-10-CM | POA: Insufficient documentation

## 2017-12-17 DIAGNOSIS — Z79899 Other long term (current) drug therapy: Secondary | ICD-10-CM | POA: Diagnosis not present

## 2017-12-17 DIAGNOSIS — Z171 Estrogen receptor negative status [ER-]: Secondary | ICD-10-CM | POA: Insufficient documentation

## 2017-12-17 DIAGNOSIS — Z170 Estrogen receptor positive status [ER+]: Principal | ICD-10-CM

## 2017-12-17 NOTE — Progress Notes (Signed)
Radiation Oncology         [phone] ________________________________  Name: [name] MRN: [id number]  Date: 12/17/2017  DOB: 07-09-1956  Re-Evaluation Visit Note  CC: Chesley Noon, MD  [name], [name], MD    ICD-10-CM   1. Recurrent breast cancer, left ([place]) C50.912     Diagnosis:   Recurrent Left Breast Cancer  Narrative:  The patient returns today for a re-evaluation after her initial consultation in our multidisciplinary Breast Clinic on 10/09/2016. At that time she was seen for palpable mass to her left upper inner breast that was brought to medical attention on 09/27/2016. The patient was seen on 06/25/2017 and 08/04/2017 for re-consultation and follow-up.  Today she returns for further evaluation. Prior to her initial radiation therapy, a recurrent mass was found in left breast. Ultimately, she underwent a left modified radical left mastectomy and at the same time, due to patient preference she underwent a right mastectomy. Right breast, no malignancy recovered. 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 The axillary node dissection reveals 1 out of 11 nodes positive (pT2, n1a). Pt has been receiving chemotherapy under the direction of Dr. Jana Hakim. She has received 3 cycles of CMF with the last cycle on 11/28/2017. The patient also continues herceptin and perjeta. She is now considered to be seen for post mastectomy radiation therapy during her adjuvant chemotherapy. Patient's methotrexate would be held during these cycles.   On review of systems, the patient endorses occasional edema in her left arm and will massage it for relief. She denies pain, or any other symptoms or complaints at this time.                           ALLERGIES:  has No Known Allergies.  Meds: Current Outpatient Medications  Medication Sig Dispense Refill  . acetaminophen (TYLENOL) 325 MG tablet Take 650 mg by mouth  every 6 (six) hours as needed for moderate pain.     Marland Kitchen ALPRAZolam (XANAX XR) 1 MG 24 hr tablet TAKE 1 TABLET BY MOUTH EVERY DAY 30 tablet 1  . busPIRone (BUSPAR) 30 MG tablet Take 15 mg by mouth daily.     . diphenhydrAMINE (BENADRYL) 25 MG tablet Take 50 mg by mouth as needed for sleep.     Marland Kitchen ibuprofen (ADVIL,MOTRIN) 200 MG tablet Take 600 mg by mouth every 6 (six) hours as needed for moderate pain.     Marland Kitchen lidocaine-prilocaine (EMLA) cream Apply to affected area once 30 g 3  . nystatin (MYCOSTATIN) 100000 UNIT/ML suspension Take 5 mLs by mouth daily as needed (ulcers).     . ondansetron (ZOFRAN) 8 MG tablet Take 1 tablet (8 mg total) by mouth 2 (two) times daily as needed for refractory nausea / vomiting. Start on day 3 after chemotherapy. 30 tablet 1  . PARoxetine (PAXIL) 40 MG tablet Take 40 mg by mouth every morning.    . prochlorperazine (COMPAZINE) 10 MG tablet Take 1 tablet (10 mg total) by mouth every 6 (six) hours as needed (Nausea or vomiting). 30 tablet 1  . ranitidine (ZANTAC) 150 MG tablet Take 150 mg by mouth daily as needed for heartburn.     Marland Kitchen tiZANidine (ZANAFLEX) 4 MG tablet TAKE 1 TABLET BY MOUTH Bedtime    . cholestyramine (QUESTRAN) 4 g packet Take 1 packet (4 g total) by mouth 3 (three) times  daily with meals. (Patient not taking: Reported on 12/17/2017) 60 each 12  . dexamethasone (DECADRON) 4 MG tablet Take 2 tablets (8 mg total) by mouth daily. Start the day after chemotherapy for 2 days. Take with food. (Patient not taking: Reported on 12/17/2017) 30 tablet 1  . LORazepam (ATIVAN) 0.5 MG tablet Take 1 tablet (0.5 mg total) by mouth every 6 (six) hours as needed (Nausea or vomiting). (Patient not taking: Reported on 12/17/2017) 30 tablet 0   No current facility-administered medications for this encounter.    Facility-Administered Medications Ordered in Other Encounters  Medication Dose Route Frequency Provider Last Rate Last Dose  . 0.9 %  sodium chloride infusion    Intravenous Once [name], [name], MD      . heparin lock flush 100 unit/mL  500 Units Intracatheter Once PRN [name], [name], MD      . sodium chloride flush (NS) 0.9 % injection 10 mL  10 mL Intracatheter PRN [name], [name], MD   10 mL at 11/28/17 1515  . sodium chloride flush (NS) 0.9 % injection 10 mL  10 mL Intracatheter PRN [name], [name], MD       Review of Systems: A 15 point review of systems is documented in the electronic medical record. This was obtained by the nursing staff. However, I reviewed this with the patient to discuss relevant findings and make appropriate changes.  Pertinent items are noted in HPI.  Physical Findings: The patient is in no acute distress. Patient is alert and oriented.  height is 5\' 9"  (1.753 m) and weight is 209 lb 9.6 oz (95.1 kg). Her temperature is 98.6 F (37 C). Her blood pressure is 146/68 (abnormal) and her pulse is 85. Her oxygen saturation is 97%. .   Lungs are clear to auscultation bilaterally. Heart has regular rate and rhythm. No palpable cervical, supraclavicular, or axillary adenopathy. Abdomen soft, non-tender, normal bowel sounds. Patient's mastectomy scar on right side has a very small area of superficial breakdown. No signs of infection or drainage.  Left mastectomy scar, has healed well without signs of drainage or infection. No sign of recurrence along left chest wall area.  Good range of motion in left arm and shoulder.  Lab Findings: Lab Results  Component Value Date   WBC 6.2 11/28/2017   HGB 12.3 11/28/2017   HCT 38.1 11/28/2017   MCV 81.0 11/28/2017   PLT 319 11/28/2017    Radiographic Findings: No results found.  Impression:    Recurrent left breast cancer. Pt developed a recurrence prior to initiation of radiation treatment ultimately requiring a mastectomy. Pt would be at high risk for local regional recurrence along the left side. I would recommend post mastectomy radiation therapy for this patient.   We  discussed the risks, benefits, and side effects of radiotherapy. We spoke about acute effects including skin irritation and fatigue as well as much less common late effects including injury to internal organs of the chest. We spoke about the latest technology that is used to minimize the risk of late effects for breast cancer patients undergoing radiotherapy. We discussed the potential for the use of breath hold technique to reduce potential complications with the heart. The patient's consent form was reviewed and signed.  Patient was initially interested in starting her radiation during adjuvant chemotherapy but after further consideration with her grandchild on the way she would like to delay until she completes chemotherapy in late May. I think this would be very reasonable  for the patient; I think she would have a lot of skin problems if we initiated treatment now, in mid to early April when she would be leaving for [place].   Plan:  Patient is scheduled for CT simulation on 03/23/2018 at 10 am, with radiation therapy due to start 2-3 weeks after her last cycle of chemotherapy.   -----------------------------------  [name] Promise, PhD, MD  This document serves as a record of services personally performed by [name] Promise, PhD, MD. It was created on his behalf by Margit Banda, a trained medical scribe. The creation of this record is based on the scribe's personal observations and the provider's statements to them.

## 2017-12-17 NOTE — Progress Notes (Signed)
[name]  [phone] [phone]     ID: AHLEY BULLS DOB: 27-Jun-1956  MR#: [id number]  BTD#:[id number]  Patient Care Team: Chesley Noon, MD as PCP - General (Family Medicine) Excell Seltzer, MD as Consulting Physician (General Surgery) Karsyn Jamie, [name], MD as Consulting Physician (Oncology) Gery Pray, MD as Consulting Physician (Radiation Oncology) Collene Gobble, MD as Consulting Physician (Pulmonary Disease) Verdell Carmine, MD as Referring Physician (Internal Medicine) Larey Dresser, MD as Consulting Physician (Cardiology) Beverely Pace, MD as Referring Physician (Surgical Oncology) OTHER MD:  CHIEF COMPLAINT: estrogen receptor positive, HER-2 positive breast cancer  CURRENT TREATMENT: Adjuvant Trastuzumab/ Pertuzumab and CMF chemotherapy   BREAST CANCER HISTORY: From the original intake note:  "[name]" had a fall while traveling living to [place] in their RV and injure her left upper arm and  her left breast . Approximately 3 weeks later she noted a mass in the left breast upper inner quadrant. It was somewhat tender. She brought this to medical attention and on 09/27/2016 underwent bilateral diagnostic mammography with tomography and [facility]. The breast density was category B. The right breast was benign. In the left breast upper inner quadrant there was a large lobulated hyperdense mass which by exam was firm and nonmobile. By ultrasonography in the 11:30 o'clock radius 4 cm from the nipple there was a mass extending to the subcutaneous tissues and measuring 4.9 cm. There was a small superficial fluid collection overlying this consistent with a hematoma. The left axilla was benign.  Biopsy of the left breast mass in question 09/30/2016 showed (SAA 17-02/04/2003) and invasive ductal carcinoma grade 3, estrogen receptor 80% positive with moderate staining intensity, progesterone  receptor negative, MIB-1 of 80%, and no HER-2 amplification, the signals ratio being 1.49 and the number per cell 2.60.  The patient's subsequent history is as detailed below  INTERVAL HISTORY: [name] returns today for follow-up and treatment of her estrogen receptor positive breast cancer. Today she receives cycle 4 of 8 planned cycles of CMF chemotherapy. She tolerated the last cycle better. She has some oral sores. She feels fatigued at times. Her peripheral neuropathy and nausea symptoms have decreased.  She also receives anti-HER-2 immunotherapy with trastuzumab and Pertuzumab, with a dose due today.  This will be continued through mid August 2019. She tolerates this well.  Her most recent echo was in December, and she has a repeat echocardiogram scheduled 12/26/2017.  She is scheduled for simulation on 03/23/2018, by which time she will be done with her CMF chemotherapy.   REVIEW OF SYSTEMS: [name] reports that her dog had surgery to remove a cyst. She feels better over all. 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 She has been working on a baby room for her grandchild. For exercise, she tries to walk around her neighborhood and she stays busy with daily activities. She denies unusual headaches, visual changes, nausea, vomiting, or dizziness. There has been no unusual cough, phlegm production, or pleurisy. This been no change in bowel or bladder habits. She denies unexplained fatigue or unexplained weight loss, bleeding, rash, or fever. A detailed review of systems was otherwise stable.     PAST MEDICAL HISTORY: Past Medical History:  Diagnosis Date  . Anxiety   . CAP (community acquired pneumonia)   . Depression   . Malignant neoplasm of upper-inner quadrant of left female breast ([place]) 10/01/2016  . Personal history of chemotherapy   .  Tobacco use     PAST SURGICAL HISTORY: Past Surgical History:  Procedure Laterality Date  . ABDOMINAL  HYSTERECTOMY  2011   Laparoscopic  . AUGMENTATION MAMMAPLASTY Bilateral 03/2017  . BREAST LUMPECTOMY Left 04/05/2017  . BREAST LUMPECTOMY WITH SENTINEL LYMPH NODE BIOPSY Left 04/07/2017  . BREAST REDUCTION SURGERY Left 04/07/2017  . CHOLECYSTECTOMY    . MASTECTOMY Bilateral 08/25/2017   at [facility]  . open reduction left ankle    . SKIN GRAFT Left 06/19/2017   bil skin grafts to breasts    FAMILY HISTORY Family History  Problem Relation Age of Onset  . Heart disease Mother   . Heart disease Father   . Cancer Sister        ovarian ca  the patient's father died at the age of 37 from a myocardial infarction. The patient's mother died at the age of 42 with congestive heart failure. The patient had one brother, 2 sisters. One of her sisters was diagnosed with ovarian cancer at age 30 and died at age 73 from that disease  GYNECOLOGIC HISTORY:  No LMP recorded. Patient has had a hysterectomy. Menarche age 19, first live birth age 74. The patient is GX P1. She stopped having periods approxiately 2008. She did not take hormone replacement. She used oral contraceptives for approxi 30 years with no complications.  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 Her husband [name] is retired from [employer]. Their daughter [name] lives in [place] and is Mudlogger of the [employer] life in [employer].     ADVANCED DIRECTIVES: not in place   HEALTH MAINTENANCE: Social History   Tobacco Use  . Smoking status: Former Smoker    Packs/day: 0.50    Years: 20.00    Pack years: 10.00    Last attempt to quit: 10/21/2016    Years since quitting: 1.1  . Smokeless tobacco: Never Used  Substance Use Topics  . Alcohol use: No  . Drug use: No     Colonoscopy:  PAP:  Bone density:   No Known Allergies  Current Outpatient Medications  Medication Sig Dispense Refill  . acetaminophen (TYLENOL) 325 MG tablet Take 650 mg by mouth  every 6 (six) hours as needed for moderate pain.     Marland Kitchen ALPRAZolam (XANAX XR) 1 MG 24 hr tablet TAKE 1 TABLET BY MOUTH EVERY DAY 30 tablet 1  . busPIRone (BUSPAR) 30 MG tablet Take 15 mg by mouth daily.     . cholestyramine (QUESTRAN) 4 g packet Take 1 packet (4 g total) by mouth 3 (three) times daily with meals. (Patient not taking: Reported on 12/17/2017) 60 each 12  . dexamethasone (DECADRON) 4 MG tablet Take 2 tablets (8 mg total) by mouth daily. Start the day after chemotherapy for 2 days. Take with food. (Patient not taking: Reported on 12/17/2017) 30 tablet 1  . diphenhydrAMINE (BENADRYL) 25 MG tablet Take 50 mg by mouth as needed for sleep.     Marland Kitchen ibuprofen (ADVIL,MOTRIN) 200 MG tablet Take 600 mg by mouth every 6 (six) hours as needed for moderate pain.     Marland Kitchen lidocaine-prilocaine (EMLA) cream Apply to affected area once 30 g 3  . LORazepam (ATIVAN) 0.5 MG tablet Take 1 tablet (0.5 mg total) by mouth every 6 (six) hours as needed (Nausea or vomiting). (Patient not taking: Reported on 12/17/2017) 30 tablet 0  . nystatin (MYCOSTATIN) 100000 UNIT/ML suspension  Take 5 mLs by mouth daily as needed (ulcers).     . ondansetron (ZOFRAN) 8 MG tablet Take 1 tablet (8 mg total) by mouth 2 (two) times daily as needed for refractory nausea / vomiting. Start on day 3 after chemotherapy. 30 tablet 1  . PARoxetine (PAXIL) 40 MG tablet Take 40 mg by mouth every morning.    . prochlorperazine (COMPAZINE) 10 MG tablet Take 1 tablet (10 mg total) by mouth every 6 (six) hours as needed (Nausea or vomiting). 30 tablet 1  . ranitidine (ZANTAC) 150 MG tablet Take 150 mg by mouth daily as needed for heartburn.     Marland Kitchen tiZANidine (ZANAFLEX) 4 MG tablet TAKE 1 TABLET BY MOUTH Bedtime     No current facility-administered medications for this visit.    Facility-Administered Medications Ordered in Other Visits  Medication Dose Route Frequency Provider Last Rate Last Dose  . 0.9 %  sodium chloride infusion   Intravenous Once  Shaniqua Guillot, [name], MD      . heparin lock flush 100 unit/mL  500 Units Intracatheter Once PRN Admir Candelas, [name], MD      . sodium chloride flush (NS) 0.9 % injection 10 mL  10 mL Intracatheter PRN Karmah Potocki, [name], MD   10 mL at 11/28/17 1515  . sodium chloride flush (NS) 0.9 % injection 10 mL  10 mL Intracatheter PRN Chozen Latulippe, [name], MD         OBJECTIVE: Middle-aged white woman in no acute distress Vitals:   12/19/17 0838  BP: (!) 153/73  Pulse: 82  Resp: 18  Temp: 98 F (36.7 C)  SpO2: 98%     Body mass index is 30.49 kg/m.   Filed Weights   12/19/17 0838  Weight: 206 lb 8 oz (93.7 kg)     ECOG FS:1 - Symptomatic but completely ambulatory   Sclerae unicteric, pupils round and equal Oropharynx clear and moist No cervical or supraclavicular adenopathy Lungs no rales or rhonchi Heart regular rate and rhythm Abd soft, nontender, positive bowel sounds MSK no focal spinal tenderness, no upper extremity lymphedema Neuro: nonfocal, well oriented, appropriate affect Breasts: Deferred  LAB RESULTS:  CMP     Component Value Date/Time   NA 140 11/28/2017 0856   NA 140 10/24/2017 1307   K 4.6 11/28/2017 0856   K 5.4 (H) 10/24/2017 1307   CL 106 11/28/2017 0856   CO2 24 11/28/2017 0856   CO2 29 10/24/2017 1307   GLUCOSE 121 11/28/2017 0856   GLUCOSE 103 10/24/2017 1307   BUN 11 11/28/2017 0856   BUN 18.2 10/24/2017 1307   CREATININE 0.83 11/28/2017 0856   CREATININE 0.8 10/24/2017 1307   CALCIUM 9.6 11/28/2017 0856   CALCIUM 10.3 10/24/2017 1307   PROT 6.8 11/28/2017 0856   PROT 7.5 10/24/2017 1307   ALBUMIN 3.9 11/28/2017 0856   ALBUMIN 4.0 10/24/2017 1307   AST 12 11/28/2017 0856   AST 9 10/24/2017 1307   ALT 10 11/28/2017 0856   ALT 8 10/24/2017 1307   ALKPHOS 134 11/28/2017 0856   ALKPHOS 115 10/24/2017 1307   BILITOT 0.3 11/28/2017 0856   BILITOT 0.28 10/24/2017 1307   GFRNONAA >60 11/28/2017 0856   GFRAA >60 11/28/2017 0856    INo results found  for: SPEP, UPEP  Lab Results  Component Value Date   WBC 6.2 11/28/2017   NEUTROABS 3.8 11/28/2017   HGB 12.3 11/28/2017   HCT 38.1 11/28/2017   MCV 81.0 11/28/2017  PLT 319 11/28/2017      Chemistry      Component Value Date/Time   NA 140 11/28/2017 0856   NA 140 10/24/2017 1307   K 4.6 11/28/2017 0856   K 5.4 (H) 10/24/2017 1307   CL 106 11/28/2017 0856   CO2 24 11/28/2017 0856   CO2 29 10/24/2017 1307   BUN 11 11/28/2017 0856   BUN 18.2 10/24/2017 1307   CREATININE 0.83 11/28/2017 0856   CREATININE 0.8 10/24/2017 1307      Component Value Date/Time   CALCIUM 9.6 11/28/2017 0856   CALCIUM 10.3 10/24/2017 1307   ALKPHOS 134 11/28/2017 0856   ALKPHOS 115 10/24/2017 1307   AST 12 11/28/2017 0856   AST 9 10/24/2017 1307   ALT 10 11/28/2017 0856   ALT 8 10/24/2017 1307   BILITOT 0.3 11/28/2017 0856   BILITOT 0.28 10/24/2017 1307       No results found for: LABCA2  No components found for: LABCA125  No results for input(s): INR in the last 168 hours.  Urinalysis    Component Value Date/Time   COLORURINE STRAW (A) 10/30/2017 2050   APPEARANCEUR CLEAR 10/30/2017 2050   LABSPEC 1.006 10/30/2017 2050   [place] 6.0 10/30/2017 2050   GLUCOSEU NEGATIVE 10/30/2017 2050   [place] NEGATIVE 10/30/2017 2050   [place] NEGATIVE 10/30/2017 2050   [place] 10/30/2017 2050   PROTEINUR NEGATIVE 10/30/2017 2050   NITRITE NEGATIVE 10/30/2017 2050   LEUKOCYTESUR NEGATIVE 10/30/2017 2050     STUDIES: No results found.   ELIGIBLE FOR AVAILABLE RESEARCH PROTOCOL: no  ASSESSMENT: 62 y.o. [employer], [place] woman status post left breast upper inner quadrant biopsy 09/30/2016 for a clinical T2 N0, stage II a invasive ductal carcinoma, grade 3, estrogen receptor positive, progesterone receptor negative, HER-2 not amplified, with an MIB-1 of 80%.  (1) genetics testing pending  (2) neoadjuvant chemotherapy given between 10/30/2016 and 03/12/2017 consisting of  cyclophosphamide and doxorubicin in dose dense fashion 4  followed by paclitaxel 1, then Abraxane (x8?)  further treatments discontinued because of neuropathy.  (3) status post left lumpectomy with oncoplasty and right reduction mammoplasty showing a residual T2 N1 invasive ductal carcinoma, with lymphovascular invasion, but negative margins; the tumor is now HER-2 positive.  (a) postop PET scan showed no evidence of metastatic disease.  (4) adjuvant trastuzumab/Pertuzumab started 06/20/2017 (received at [facility]) started here on 07/11/2017--continue through August 2019  (a) Echo on 06/18/2017 at [place]: LVEF 55-60%  (b) echocardiogram 09/26/2017 shows an ejection fraction in the 60-65% range.  (5) adjuvant radiation to follow  (6) anti-estrogens: Letrozole daily started 03/26/2017  (a) DEXA on 04/03/2017 demonstrated a T score of -1.3 in the right femoral neck  (b) Zometa given at [facility] on 06/20/2017  RECURRENCE: (7) Patient developed left inner breast nodule and underwent mammogram and ultrasound on 08/06/17 showing three areas of concern at 10 o'clock, 1130 o'clock, and at 8 o'clock. Biopsy on 08/07/2017 that demonstrated at 11:30 fat necrosis, however at 10:00 it was positive for IDC, grade 3, ER+(50%), PR-(0%), Ki-67 90%, HER-2 negative (ratio 1.48).  (a) staging chest CT 08/15/2017 shows 0.5 cm left lower lobe lung nodule of uncertain significance  (8) status post left modified radical mastectomy and right simple mastectomy 08/25/2017 showing  (a) right breast, no evidence of malignancy  (b) left breast, ypT2 ypN1 invasive ductal carcinoma, grade 3, with negative margins; repeat prognostic panel: Triple negative  (9) started adjuvant chemotherapy with cyclophosphamide/methotrexate/fluorouracil [CMF] 11/10/2016, 8 cycles planned  (  a) will continue trastuzumab/Pertuzumab concurrently  (b) echocardiogram 09/26/2017 showed an ejection fraction in the 60-65% range  (10) adjuvant radiation with  cycles 3-5 of chemotherapy.gcmend  (11) resume antiestrogens at the completion of chemotherapy   PLAN:   Teyah continues to do quite well with her chemotherapy and we are proceeding to cycle 4 of 8 today.  She also receive her trastuzumab and Pertuzumab with no significant side effects.  She is already scheduled for an echocardiogram next week  She and Dr. Dorathy Daft have decided not to start radiation until after the completion of chemotherapy.  That does facilitate scheduling.  Her daughter is going to be having a baby in April in [place].  We are going to treat [place] on 01/29/2018 and she will leave the next day after [facility].  It is not clear when she will be returning.  She will be scheduled with oncologist in [place] and she can continue her CMF treatments and trastuzumab and Pertuzumab treatments there so long as she is needed to help her daughter out.  She is having some mouth sores and I am starting her on valacyclovir  She knows to call for any other issues that may develop before the next visit.  Noha Milberger, [name], MD  12/19/17 8:53 AM Medical Oncology and Hematology [facility] [address] [place], [place] [address] Tel. [phone]    Fax. [phone]  This document serves as a record of services personally performed by Lurline Del, MD. It was created on his behalf by Sheron Nightingale, a trained medical scribe. The creation of this record is based on the scribe's personal observations and the provider's statements to them.   I have reviewed the above documentation for accuracy and completeness, and I agree with the above.

## 2017-12-19 ENCOUNTER — Inpatient Hospital Stay: Payer: BLUE CROSS/BLUE SHIELD | Attending: Oncology

## 2017-12-19 ENCOUNTER — Telehealth: Payer: Self-pay | Admitting: Oncology

## 2017-12-19 ENCOUNTER — Inpatient Hospital Stay (HOSPITAL_BASED_OUTPATIENT_CLINIC_OR_DEPARTMENT_OTHER): Payer: BLUE CROSS/BLUE SHIELD | Admitting: Oncology

## 2017-12-19 ENCOUNTER — Inpatient Hospital Stay: Payer: BLUE CROSS/BLUE SHIELD

## 2017-12-19 VITALS — BP 153/73 | HR 82 | Temp 98.0°F | Resp 18 | Ht 69.0 in | Wt 206.5 lb

## 2017-12-19 DIAGNOSIS — Z5112 Encounter for antineoplastic immunotherapy: Secondary | ICD-10-CM | POA: Insufficient documentation

## 2017-12-19 DIAGNOSIS — Z170 Estrogen receptor positive status [ER+]: Secondary | ICD-10-CM | POA: Insufficient documentation

## 2017-12-19 DIAGNOSIS — C50912 Malignant neoplasm of unspecified site of left female breast: Secondary | ICD-10-CM

## 2017-12-19 DIAGNOSIS — Z5111 Encounter for antineoplastic chemotherapy: Secondary | ICD-10-CM | POA: Diagnosis present

## 2017-12-19 DIAGNOSIS — C50212 Malignant neoplasm of upper-inner quadrant of left female breast: Secondary | ICD-10-CM | POA: Diagnosis not present

## 2017-12-19 LAB — CBC WITH DIFFERENTIAL/PLATELET
BASOS PCT: 2 %
Basophils Absolute: 0.1 10*3/uL (ref 0.0–0.1)
EOS ABS: 0.7 10*3/uL — AB (ref 0.0–0.5)
EOS PCT: 12 %
HEMATOCRIT: 38.6 % (ref 34.8–46.6)
Hemoglobin: 12.4 g/dL (ref 11.6–15.9)
Lymphocytes Relative: 16 %
Lymphs Abs: 1 10*3/uL (ref 0.9–3.3)
MCH: 26.2 pg (ref 25.1–34.0)
MCHC: 32.2 g/dL (ref 31.5–36.0)
MCV: 81.4 fL (ref 79.5–101.0)
MONO ABS: 0.8 10*3/uL (ref 0.1–0.9)
Monocytes Relative: 13 %
Neutro Abs: 3.6 10*3/uL (ref 1.5–6.5)
Neutrophils Relative %: 57 %
PLATELETS: 319 10*3/uL (ref 145–400)
RBC: 4.74 MIL/uL (ref 3.70–5.45)
RDW: 18.7 % — AB (ref 11.2–14.5)
Smear Review: 2
WBC: 6.2 10*3/uL (ref 3.9–10.3)

## 2017-12-19 LAB — COMPREHENSIVE METABOLIC PANEL
ALBUMIN: 3.9 g/dL (ref 3.5–5.0)
ALK PHOS: 140 U/L (ref 40–150)
ALT: 27 U/L (ref 0–55)
AST: 29 U/L (ref 5–34)
Anion gap: 9 (ref 3–11)
BILIRUBIN TOTAL: 0.3 mg/dL (ref 0.2–1.2)
BUN: 16 mg/dL (ref 7–26)
CALCIUM: 9.8 mg/dL (ref 8.4–10.4)
CO2: 26 mmol/L (ref 22–29)
CREATININE: 0.83 mg/dL (ref 0.60–1.10)
Chloride: 107 mmol/L (ref 98–109)
GFR calc Af Amer: >60 mL/min (ref >60)
Glucose, Bld: 118 mg/dL (ref 70–140)
POTASSIUM: 4.7 mmol/L (ref 3.5–5.1)
Sodium: 142 mmol/L (ref 136–145)
TOTAL PROTEIN: 6.8 g/dL (ref 6.4–8.3)

## 2017-12-19 MED ORDER — HEPARIN SOD (PORK) LOCK FLUSH 100 UNIT/ML IV SOLN
500.0000 [IU] | Freq: Once | INTRAVENOUS | Status: AC | PRN
  Administered 2017-12-19: 500 [IU]
  Filled 2017-12-19: qty 5

## 2017-12-19 MED ORDER — PALONOSETRON HCL INJECTION 0.25 MG/5ML
0.2500 mg | Freq: Once | INTRAVENOUS | Status: AC
  Administered 2017-12-19: 0.25 mg via INTRAVENOUS

## 2017-12-19 MED ORDER — METHOTREXATE SODIUM (PF) CHEMO INJECTION 250 MG/10ML
85.0000 mg | Freq: Once | INTRAMUSCULAR | Status: AC
  Administered 2017-12-19: 85 mg via INTRAVENOUS
  Filled 2017-12-19: qty 3.4

## 2017-12-19 MED ORDER — SODIUM CHLORIDE 0.9% FLUSH
10.0000 mL | INTRAVENOUS | Status: DC | PRN
  Administered 2017-12-19: 10 mL
  Filled 2017-12-19: qty 10

## 2017-12-19 MED ORDER — DIPHENHYDRAMINE HCL 25 MG PO CAPS
25.0000 mg | ORAL_CAPSULE | Freq: Once | ORAL | Status: AC
  Administered 2017-12-19: 25 mg via ORAL

## 2017-12-19 MED ORDER — FLUOROURACIL CHEMO INJECTION 2.5 GM/50ML
600.0000 mg/m2 | Freq: Once | INTRAVENOUS | Status: AC
  Administered 2017-12-19: 1250 mg via INTRAVENOUS
  Filled 2017-12-19: qty 25

## 2017-12-19 MED ORDER — ACETAMINOPHEN 325 MG PO TABS
650.0000 mg | ORAL_TABLET | Freq: Once | ORAL | Status: AC
  Administered 2017-12-19: 650 mg via ORAL

## 2017-12-19 MED ORDER — TRASTUZUMAB CHEMO 150 MG IV SOLR
6.0000 mg/kg | Freq: Once | INTRAVENOUS | Status: AC
  Administered 2017-12-19: 567 mg via INTRAVENOUS
  Filled 2017-12-19: qty 27

## 2017-12-19 MED ORDER — DEXAMETHASONE SODIUM PHOSPHATE 10 MG/ML IJ SOLN
10.0000 mg | Freq: Once | INTRAMUSCULAR | Status: AC
  Administered 2017-12-19: 10 mg via INTRAVENOUS

## 2017-12-19 MED ORDER — DEXAMETHASONE SODIUM PHOSPHATE 10 MG/ML IJ SOLN
INTRAMUSCULAR | Status: AC
  Filled 2017-12-19: qty 1

## 2017-12-19 MED ORDER — SODIUM CHLORIDE 0.9 % IV SOLN
420.0000 mg | Freq: Once | INTRAVENOUS | Status: AC
  Administered 2017-12-19: 420 mg via INTRAVENOUS
  Filled 2017-12-19: qty 14

## 2017-12-19 MED ORDER — SODIUM CHLORIDE 0.9 % IV SOLN
600.0000 mg/m2 | Freq: Once | INTRAVENOUS | Status: AC
  Administered 2017-12-19: 1280 mg via INTRAVENOUS
  Filled 2017-12-19: qty 64

## 2017-12-19 MED ORDER — ACETAMINOPHEN 325 MG PO TABS
ORAL_TABLET | ORAL | Status: AC
  Filled 2017-12-19: qty 2

## 2017-12-19 MED ORDER — PALONOSETRON HCL INJECTION 0.25 MG/5ML
INTRAVENOUS | Status: AC
  Filled 2017-12-19: qty 5

## 2017-12-19 MED ORDER — NYSTATIN 100000 UNIT/ML MT SUSP: 5.0000 mL | Freq: Every day | OROMUCOSAL | 6 refills | Status: DC | PRN

## 2017-12-19 MED ORDER — VALACYCLOVIR HCL 1 G PO TABS: 1000.0000 mg | ORAL_TABLET | Freq: Every day | ORAL | 2 refills | Status: AC

## 2017-12-19 MED ORDER — DIPHENHYDRAMINE HCL 25 MG PO CAPS
ORAL_CAPSULE | ORAL | Status: AC
  Filled 2017-12-19: qty 1

## 2017-12-19 MED ORDER — SODIUM CHLORIDE 0.9 % IV SOLN
Freq: Once | INTRAVENOUS | Status: AC
  Administered 2017-12-19: 10:00:00 via INTRAVENOUS

## 2017-12-19 NOTE — Patient Instructions (Signed)
[facility] Discharge Instructions for Patients Receiving Chemotherapy  Today you received the following chemotherapy agents:  Cytoxan, Methotrexate, Fluorouracil, Herceptin, Perjeta  To help prevent nausea and vomiting after your treatment, we encourage you to take your nausea medication as prescribed.   If you develop nausea and vomiting that is not controlled by your nausea medication, call the clinic.   BELOW ARE SYMPTOMS THAT SHOULD BE REPORTED IMMEDIATELY:  *FEVER GREATER THAN 100.5 F  *CHILLS WITH OR WITHOUT FEVER  NAUSEA AND VOMITING THAT IS NOT CONTROLLED WITH YOUR NAUSEA MEDICATION  *UNUSUAL SHORTNESS OF BREATH  *UNUSUAL BRUISING OR BLEEDING  TENDERNESS IN MOUTH AND THROAT WITH OR WITHOUT PRESENCE OF ULCERS  *URINARY PROBLEMS  *BOWEL PROBLEMS  UNUSUAL RASH Items with * indicate a potential emergency and should be followed up as soon as possible.  Feel free to call the clinic should you have any questions or concerns. The clinic phone number is [phone].  Please show the [facility] [place] at check-in to the Emergency Department and triage nurse.

## 2017-12-19 NOTE — Telephone Encounter (Signed)
Gave avs and calendar for march and april °

## 2017-12-25 ENCOUNTER — Other Ambulatory Visit: Payer: Self-pay | Admitting: *Deleted

## 2017-12-26 ENCOUNTER — Ambulatory Visit: Payer: BLUE CROSS/BLUE SHIELD

## 2017-12-26 ENCOUNTER — Ambulatory Visit (HOSPITAL_COMMUNITY)
Admission: RE | Admit: 2017-12-26 | Discharge: 2017-12-26 | Disposition: A | Discharge status: Home / Self Care | Payer: BLUE CROSS/BLUE SHIELD | Source: Ambulatory Visit | Attending: Cardiology | Admitting: Cardiology

## 2017-12-26 ENCOUNTER — Other Ambulatory Visit: Payer: BLUE CROSS/BLUE SHIELD

## 2017-12-26 ENCOUNTER — Ambulatory Visit (HOSPITAL_BASED_OUTPATIENT_CLINIC_OR_DEPARTMENT_OTHER)
Admission: RE | Admit: 2017-12-26 | Discharge: 2017-12-26 | Disposition: A | Discharge status: Home / Self Care | Payer: BLUE CROSS/BLUE SHIELD | Source: Ambulatory Visit | Attending: Cardiology | Admitting: Cardiology

## 2017-12-26 ENCOUNTER — Other Ambulatory Visit: Payer: Self-pay

## 2017-12-26 ENCOUNTER — Encounter (HOSPITAL_COMMUNITY): Payer: Self-pay | Admitting: Cardiology

## 2017-12-26 VITALS — BP 144/80 | HR 85 | Wt 206.0 lb

## 2017-12-26 DIAGNOSIS — C50212 Malignant neoplasm of upper-inner quadrant of left female breast: Secondary | ICD-10-CM | POA: Insufficient documentation

## 2017-12-26 DIAGNOSIS — Z720 Tobacco use: Secondary | ICD-10-CM | POA: Diagnosis not present

## 2017-12-26 DIAGNOSIS — Z8041 Family history of malignant neoplasm of ovary: Secondary | ICD-10-CM | POA: Insufficient documentation

## 2017-12-26 DIAGNOSIS — Z170 Estrogen receptor positive status [ER+]: Secondary | ICD-10-CM | POA: Diagnosis not present

## 2017-12-26 DIAGNOSIS — Z8249 Family history of ischemic heart disease and other diseases of the circulatory system: Secondary | ICD-10-CM | POA: Diagnosis not present

## 2017-12-26 DIAGNOSIS — Z9012 Acquired absence of left breast and nipple: Secondary | ICD-10-CM | POA: Diagnosis not present

## 2017-12-26 DIAGNOSIS — F329 Major depressive disorder, single episode, unspecified: Secondary | ICD-10-CM | POA: Insufficient documentation

## 2017-12-26 NOTE — Progress Notes (Signed)
  Echocardiogram 2D Echocardiogram has been performed.  Bobbye Charleston 12/26/2017, 10:03 AM

## 2017-12-26 NOTE — Patient Instructions (Signed)
Your physician has requested that you have an echocardiogram. Echocardiography is a painless test that uses sound waves to create images of your heart. It provides your doctor with information about the size and shape of your heart and how well your heart's chambers and valves are working. This procedure takes approximately one hour. There are no restrictions for this procedure.  Your physician recommends that you schedule a follow-up appointment in: 3 months with Dr. McLean  an a echocardiogram    

## 2017-12-27 NOTE — Progress Notes (Signed)
Oncology: Dr. Jana Hakim  62 yo with history of breath cancer presents for cardio-oncology evaluation.  She was referred by Dr. Jana Hakim.  She was diagnosed with breath cancer in 12/17, ER+/PR-/HER2-.  Treated with cyclophosphamide/doxorubicin then Paclitaxel/abraxane.  Left lumpectomy was then done.  Biopsy showed the tumor was now HER2+ and she started trastuzumab/pertuzumab in 8/18.  She had left mastectomy with axillary node dissection in 11/18, tumor now ER-/PR-/HER2-.  She is currently getting Herceptin.     She is stable symptomatically.  No chest pain or exertional dyspnea.  Echo was done today and reviewed, technically difficult study but EF remains normal.  PMH: 1. Depression 2. Breast cancer: Diagnosed 12/17, ER+/PR-/HER2-.  Treated with cyclophosphamide/doxorubicin then Paclitaxel/abraxane.  Left lumpectomy was then done.  Biopsy showed the tumor was now HER2+ and she started trastuzumab/pertuzumab in 8/18.  She had left mastectomy with axillary node dissection in 11/18, tumor now ER-/PR-/HER2-.  Plan for now is to continue Herceptin for a year.  She will also start on cyclophosphamide/MTX/5FU in 1/19.  - Echo (3/18): EF 55-60% - Echo (12/18): EF 60-65%, GLS -12.1% but difficult images.  - Echo (3/19): EF 55-60%, technically difficult study with inaccurate strain images.   Social History   Socioeconomic History  . Marital status: Married    Spouse name: Not on file  . Number of children: 1  . Years of education: Not on file  . Highest education level: Not on file  Social Needs  . Financial resource strain: Not on file  . Food insecurity - worry: Not on file  . Food insecurity - inability: Not on file  . Transportation needs - medical: Not on file  . Transportation needs - non-medical: Not on file  Occupational History  . Not on file  Tobacco Use  . Smoking status: Former Smoker    Packs/day: 0.50    Years: 20.00    Pack years: 10.00    Last attempt to quit: 10/21/2016   Years since quitting: 1.1  . Smokeless tobacco: Never Used  Substance and Sexual Activity  . Alcohol use: No  . Drug use: No  . Sexual activity: Not on file  Other Topics Concern  . Not on file  Social History Narrative  . Not on file   Family History  Problem Relation Age of Onset  . Heart disease Mother   . Heart disease Father   . Cancer Sister        ovarian ca   General: NAD Neck: No JVD, no thyromegaly or thyroid nodule.  Lungs: Clear to auscultation bilaterally with normal respiratory effort. CV: Nondisplaced PMI.  Heart regular S1/S2, no S3/S4, no murmur.  No peripheral edema.  No carotid bruit.  Normal pedal pulses.  Abdomen: Soft, nontender, no hepatosplenomegaly, no distention.  Skin: Intact without lesions or rashes.  Neurologic: Alert and oriented x 3.  Psych: Normal affect. Extremities: No clubbing or cyanosis.  HEENT: Normal.   Assessment/Plan: 1. Breast cancer: Patient is currently getting Herceptin treatment, she says that she will be getting 4 more doses. I reviewed today's echo: images were very difficult as in the past.  EF remains normal but I do not think strain is accurate.  Given normal EF, she will continue Herceptin.  - Repeat echo with office visit in 3 months. This will be her last echo if normal.    Loralie Champagne 12/27/2017

## 2017-12-31 ENCOUNTER — Other Ambulatory Visit: Payer: Self-pay | Admitting: *Deleted

## 2017-12-31 ENCOUNTER — Telehealth: Payer: Self-pay | Admitting: *Deleted

## 2017-12-31 ENCOUNTER — Other Ambulatory Visit: Payer: Self-pay | Admitting: Oncology

## 2017-12-31 DIAGNOSIS — N6459 Other signs and symptoms in breast: Secondary | ICD-10-CM

## 2017-12-31 DIAGNOSIS — Z170 Estrogen receptor positive status [ER+]: Principal | ICD-10-CM

## 2017-12-31 DIAGNOSIS — C50212 Malignant neoplasm of upper-inner quadrant of left female breast: Secondary | ICD-10-CM

## 2017-12-31 NOTE — Telephone Encounter (Signed)
"  There is a knot at the sternal edge of my incision to left breast.  Never noticed anything there since surgery in November.  No pain, redness or warmth.  Feels soft with firm center.  Noticeable when lying down.  I do not notice it when standing, goes away or just less noticeable.  I'm available if I need to come in.  Return number [phone]."

## 2017-12-31 NOTE — Progress Notes (Unsigned)
[name] came today because she found a new bump in her left chest wall and she wanted me to look at it.  The area of concern is in the medial left chest wall scar superiorly.  It is approximately 1 cm, subcutaneous, not erythematous or tender.  It certainly could be a metastatic deposit or it could be scar tissue.  This needs to be biopsied.  We are referring her to the breast center to get this done as soon as possible

## 2017-12-31 NOTE — Telephone Encounter (Signed)
This RN spoke with pt- inquired if she could " drop in " for quick assessment for further advice.

## 2017-12-31 NOTE — Progress Notes (Signed)
[facility]  [phone]) [phone] Fax:(336) [phone]     ID: [name] DOB: 08-Sep-1956  MR#: [id number]  OJJ#:[id number]  Patient Care Team: Chesley Noon, MD as PCP - General (Family Medicine) Excell Seltzer, MD as Consulting Physician (General Surgery) [name], [name], MD as Consulting Physician (Oncology) Gery Pray, MD as Consulting Physician (Radiation Oncology) Collene Gobble, MD as Consulting Physician (Pulmonary Disease) Verdell Carmine, MD as Referring Physician (Internal Medicine) Larey Dresser, MD as Consulting Physician (Cardiology) Beverely Pace, MD as Referring Physician (Surgical Oncology) OTHER MD:  CHIEF COMPLAINT: estrogen receptor positive, HER-2 positive breast cancer  CURRENT TREATMENT: Adjuvant Trastuzumab/ Pertuzumab    BREAST CANCER HISTORY: From the original intake note:  "[name]" had a fall while traveling living to [place] in their RV and injure her left upper arm and  her left breast . Approximately 3 weeks later she noted a mass in the left breast upper inner quadrant. It was somewhat tender. She brought this to medical attention and on 09/27/2016 underwent bilateral diagnostic mammography with tomography [facility] The breast density was category B. The right breast was benign. In the left breast upper inner quadrant there was a large lobulated hyperdense mass which by exam was firm and nonmobile. By ultrasonography in the 11:30 o'clock radius 4 cm from the nipple there was a mass extending to the subcutaneous tissues and measuring 4.9 cm. There was a small superficial fluid collection overlying this consistent with a hematoma. The left axilla was benign.  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  The patient's subsequent history is as detailed below  INTERVAL HISTORY: [name] returns today for follow-up and treatment of her biphenotypic cancer. She is accompanied by her husband.  After her last visit here we set her up for biopsy of what proved to be a further local recurrence to the left chest wall, on 01/05/2018.  This showed (SAA 19-11/27/2006) invasive ductal carcinoma, grade 3, weakly estrogen receptor positive at 70%, progesterone receptor negative, with an MIB-1 of 90%, and no HER-2 amplification, the signals ratio being 1.57 and the number per cell 2.90.  She then proceeded to excision of this lesion under Dr. Adair Laundry.  This was performed 01/08/2018.  She will need skin grafting and very likely a wound VAC which is scheduled to be placed on 01/12/2018.  [name] is here today to discuss those results.  REVIEW OF SYSTEMS: [name] is doing well. She reports having a normal bowel movement yesterday morning, but notes that might change since taking the hydrocodone for her recent surgery, yesterday 01/08/2018. [name] tolerated surgery well yesterday, but endorses some pain last night. She has been nervous to take hydrocodone in fear of becoming addicted to it. On Monday, 01/12/2018, she will undergo a skin graft. She denies unusual headaches, visual changes, nausea, vomiting, or dizziness. There has been no unusual cough, phlegm production, or pleurisy. This been no change in bowel or bladder habits. She denies unexplained fatigue or unexplained weight loss, bleeding, rash, or fever. A detailed review of systems was otherwise noncontributory.   PAST MEDICAL HISTORY: Past Medical History:  Diagnosis Date  . Anxiety   . CAP (community acquired pneumonia)   . Depression   .  Malignant neoplasm of upper-inner quadrant of left female breast ([place]) 10/01/2016  . Personal history of chemotherapy   . Tobacco  use     PAST SURGICAL HISTORY: Past Surgical History:  Procedure Laterality Date  . ABDOMINAL HYSTERECTOMY  2011   Laparoscopic  . AUGMENTATION MAMMAPLASTY Bilateral 03/2017  . BREAST LUMPECTOMY Left 04/05/2017  . BREAST LUMPECTOMY WITH SENTINEL LYMPH NODE BIOPSY Left 04/07/2017  . BREAST REDUCTION SURGERY Left 04/07/2017  . CHOLECYSTECTOMY    . MASTECTOMY Bilateral 08/25/2017   at [facility]  . open reduction left ankle    . SKIN GRAFT Left 06/19/2017   bil skin grafts to breasts    FAMILY HISTORY Family History  Problem Relation Age of Onset  . Heart disease Mother   . Heart disease Father   . Cancer Sister        ovarian ca  the patient's father died at the age of 38 from a myocardial infarction. The patient's mother died at the age of 36 with congestive heart failure. The patient had one brother, 2 sisters. One of her sisters was diagnosed with ovarian cancer at age 39 and died at age 68 from that disease  GYNECOLOGIC HISTORY:  No LMP recorded. Patient has had a hysterectomy. Menarche age 76, first live birth age 34. The patient is GX P1. She stopped having periods approxiately 2008. She did not take hormone replacement. She used oral contraceptives for approxi 30 years with no complications.  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 Her husband [name] is retired from [employer]. Their daughter [name] lives in [place] and is Mudlogger of the [employer] in [employer].     ADVANCED DIRECTIVES: not in place   HEALTH MAINTENANCE: Social History   Tobacco Use  . Smoking status: Former Smoker    Packs/day: 0.50    Years: 20.00    Pack years: 10.00    Last attempt to quit: 10/21/2016    Years since quitting: 1.2  . Smokeless tobacco: Never Used  Substance Use Topics  . Alcohol use: No  . Drug use: No     Colonoscopy:  PAP:  Bone density:   No Known Allergies  Current Outpatient Medications   Medication Sig Dispense Refill  . acetaminophen (TYLENOL) 325 MG tablet Take 650 mg by mouth every 6 (six) hours as needed for moderate pain.     Marland Kitchen ALPRAZolam (XANAX XR) 1 MG 24 hr tablet TAKE 1 TABLET BY MOUTH EVERY DAY 30 tablet 1  . busPIRone (BUSPAR) 30 MG tablet Take 15 mg by mouth daily.     . diphenhydrAMINE (BENADRYL) 25 MG tablet Take 50 mg by mouth as needed for sleep.     Marland Kitchen ibuprofen (ADVIL,MOTRIN) 200 MG tablet Take 600 mg by mouth every 6 (six) hours as needed for moderate pain.     Marland Kitchen lidocaine-prilocaine (EMLA) cream Apply to affected area once 30 g 3  . nystatin (MYCOSTATIN) 100000 UNIT/ML suspension Take 5 mLs (500,000 Units total) by mouth daily as needed (ulcers). 240 mL 6  . ondansetron (ZOFRAN) 8 MG tablet Take 1 tablet (8 mg total) by mouth 2 (two) times daily as needed for refractory nausea / vomiting. Start on day 3 after chemotherapy. 30 tablet 1  . PARoxetine (PAXIL) 40 MG tablet Take 40 mg by mouth every morning.    . prochlorperazine (COMPAZINE) 10 MG tablet Take 1 tablet (10 mg total)  by mouth every 6 (six) hours as needed (Nausea or vomiting). 30 tablet 1  . ranitidine (ZANTAC) 150 MG tablet Take 150 mg by mouth daily as needed for heartburn.     Marland Kitchen tiZANidine (ZANAFLEX) 4 MG tablet TAKE 1 TABLET BY MOUTH Bedtime    . valACYclovir (VALTREX) 1000 MG tablet Take 1 tablet (1,000 mg total) by mouth daily. 90 tablet 2   No current facility-administered medications for this visit.    Facility-Administered Medications Ordered in Other Visits  Medication Dose Route Frequency Provider Last Rate Last Dose  . heparin lock flush 100 unit/mL  500 Units Intracatheter Once PRN [name], [name], MD         OBJECTIVE: Middle-aged white woman who appears stated age  Vitals:   01/09/18 0934  BP: (!) 175/87  Pulse: 84  Resp: 18  Temp: 98.4 F (36.9 C)  SpO2: 100%     Body mass index is 30.92 kg/m.   Filed Weights   01/09/18 0934  Weight: 209 lb 6.4 oz (95 kg)      ECOG FS:1 - Symptomatic but completely ambulatory   Sclerae unicteric, EOMs intact Oropharynx clear and moist No cervical or supraclavicular adenopathy Lungs no rales or rhonchi Heart regular rate and rhythm Abd soft, nontender, positive bowel sounds MSK no focal spinal tenderness, no upper extremity lymphedema Neuro: nonfocal, well oriented, appropriate affect Breasts: Status post bilateral mastectomies.  On the left the patient is status post recent surgery in the area is thoroughly bandaged.  It was not uncovered  LAB RESULTS:  CMP     Component Value Date/Time   NA 142 12/19/2017 0858   NA 140 10/24/2017 1307   K 4.7 12/19/2017 0858   K 5.4 (H) 10/24/2017 1307   CL 107 12/19/2017 0858   CO2 26 12/19/2017 0858   CO2 29 10/24/2017 1307   GLUCOSE 118 12/19/2017 0858   GLUCOSE 103 10/24/2017 1307   BUN 16 12/19/2017 0858   BUN 18.2 10/24/2017 1307   CREATININE 0.83 12/19/2017 0858   CREATININE 0.8 10/24/2017 1307   CALCIUM 9.8 12/19/2017 0858   CALCIUM 10.3 10/24/2017 1307   PROT 6.8 12/19/2017 0858   PROT 7.5 10/24/2017 1307   ALBUMIN 3.9 12/19/2017 0858   ALBUMIN 4.0 10/24/2017 1307   AST 29 12/19/2017 0858   AST 9 10/24/2017 1307   ALT 27 12/19/2017 0858   ALT 8 10/24/2017 1307   ALKPHOS 140 12/19/2017 0858   ALKPHOS 115 10/24/2017 1307   BILITOT 0.3 12/19/2017 0858   BILITOT 0.28 10/24/2017 1307   GFRNONAA >60 12/19/2017 0858   GFRAA >60 12/19/2017 0858    INo results found for: SPEP, UPEP  Lab Results  Component Value Date   WBC 11.8 (H) 01/09/2018   NEUTROABS 9.4 (H) 01/09/2018   HGB 12.4 01/09/2018   HCT 39.7 01/09/2018   MCV 86.1 01/09/2018   PLT 308 01/09/2018      Chemistry      Component Value Date/Time   NA 142 12/19/2017 0858   NA 140 10/24/2017 1307   K 4.7 12/19/2017 0858   K 5.4 (H) 10/24/2017 1307   CL 107 12/19/2017 0858   CO2 26 12/19/2017 0858   CO2 29 10/24/2017 1307   BUN 16 12/19/2017 0858   BUN 18.2 10/24/2017 1307    CREATININE 0.83 12/19/2017 0858   CREATININE 0.8 10/24/2017 1307      Component Value Date/Time   CALCIUM 9.8 12/19/2017 0858   CALCIUM 10.3  10/24/2017 1307   ALKPHOS 140 12/19/2017 0858   ALKPHOS 115 10/24/2017 1307   AST 29 12/19/2017 0858   AST 9 10/24/2017 1307   ALT 27 12/19/2017 0858   ALT 8 10/24/2017 1307   BILITOT 0.3 12/19/2017 0858   BILITOT 0.28 10/24/2017 1307       No results found for: LABCA2  No components found for: LABCA125  No results for input(s): INR in the last 168 hours.  Urinalysis    Component Value Date/Time   COLORURINE STRAW (A) 10/30/2017 2050   APPEARANCEUR CLEAR 10/30/2017 2050   LABSPEC 1.006 10/30/2017 2050   [place] 6.0 10/30/2017 2050   GLUCOSEU NEGATIVE 10/30/2017 2050   [place] NEGATIVE 10/30/2017 2050   [place] 10/30/2017 2050   [place] 10/30/2017 2050   PROTEINUR NEGATIVE 10/30/2017 2050   NITRITE NEGATIVE 10/30/2017 2050   LEUKOCYTESUR NEGATIVE 10/30/2017 2050     STUDIES: US Breast Ltd Uni Left Inc Axilla  Result Date: 01/02/2018 CLINICAL DATA:  62 year old female with history of LEFT breast cancer, recurrence and LEFT mastectomy in November, 2018. She now presents with a palpable lump within the LEFT breast/chest just MEDIAL to the lumpectomy scar. EXAM: ULTRASOUND OF THE LEFT BREAST COMPARISON:  Previous exam(s). FINDINGS: On physical exam, a firm palpable mass identified 1 cm MEDIAL to the UPPER portion of the vertical lumpectomy scar. Targeted ultrasound is performed, showing a 1.5 x 1 x 1.6 cm irregular hypoechoic mass with internal vascular flow within the LEFT chest, 1 cm MEDIAL to the UPPER portion of the vertical lumpectomy scar. No abnormal LEFT axillary lymph nodes are identified. IMPRESSION: Suspicious 1.6 cm mass within the UPPER LEFT chest. Tissue sampling recommended. RECOMMENDATION: Ultrasound-guided LEFT breast biopsy, which has been scheduled for 01/05/2018. I have discussed the findings and  recommendations with the patient. Results were also provided in writing at the conclusion of the visit. If applicable, a reminder letter will be sent to the patient regarding the next appointment. BI-RADS CATEGORY  4: Suspicious. Electronically Signed   By: Margarette Canada M.D.   On: 01/02/2018 13:41   [place] Lt Breast Bx W Loc Dev 1st Lesion Img Bx Spec US Guide  Addendum Date: 01/06/2018   ADDENDUM REPORT: 01/06/2018 10:34 ADDENDUM: Pathology revealed GRADE III INVASIVE DUCTAL CARCINOMA, LYMPHOVASCULAR INVASION IS IDENTIFIED of the Left breast, chest wall. The carcinoma involves skeletal muscle. This was found to be concordant by Dr. Everlean Alstrom. Pathology results were discussed with the patient by telephone. The patient reported doing well after the biopsy with tenderness at the site. Post biopsy instructions and care were reviewed and questions were answered. The patient was encouraged to call [facility] for any additional concerns. The patient has a recent diagnosis of left breast cancer and should follow her outlined treatment plan. Imaging and pathology reports were faxed to Dr. Beverely Pace of [facility] on January 06, 2018. Pathology results were sent to Dr. Tressa Busman at [facility] via [facility] message. Pathology results reported by Terie Purser, RN on 01/06/2018. Electronically Signed   By: Everlean Alstrom M.D.   On: 01/06/2018 10:34   Result Date: 01/06/2018 CLINICAL DATA:  62 year old female with history of left breast cancer post lumpectomy 04/07/2017 and subsequent mastectomy 08/25/2017 presents with a new suspicious palpable abnormality adjacent to the mastectomy scar. EXAM: ULTRASOUND GUIDED LEFT BREAST CORE NEEDLE BIOPSY COMPARISON:  Previous exam(s). FINDINGS: I met with the patient and we discussed  the procedure of ultrasound-guided biopsy, including benefits and alternatives. We discussed the high likelihood of a  successful procedure. We discussed the risks of the procedure, including infection, bleeding, tissue injury, clip migration, and inadequate sampling. Informed written consent was given. The usual time-out protocol was performed immediately prior to the procedure. Lesion quadrant: Upper-outer Using sterile technique and 1% Lidocaine as local anesthetic, under direct ultrasound visualization, a 14 gauge spring-loaded device was used to perform biopsy of the palpable chest wall mass along the lateral margin of the mastectomy scar using a lateral to medial approach. At the conclusion of the procedure a heart shaped tissue marker clip was deployed into the biopsy cavity. Post biopsy mammogram was not performed. IMPRESSION: Ultrasound guided biopsy of the palpable chest wall mass along the lateral margin of the mastectomy scar. No apparent complications. Electronically Signed: By: Everlean Alstrom M.D. On: 01/05/2018 09:21     ELIGIBLE FOR AVAILABLE RESEARCH PROTOCOL: no  ASSESSMENT: 62 y.o. [employer], [place] woman status post left breast upper inner quadrant biopsy 09/30/2016 for a clinical T2 N0, stage II a invasive ductal carcinoma, grade 3, estrogen receptor positive, progesterone receptor negative, HER-2 not amplified, with an MIB-1 of 80%.  (1) genetics testing pending  (2) neoadjuvant chemotherapy given between 10/30/2016 and 03/12/2017 consisting of cyclophosphamide and doxorubicin in dose dense fashion 4  followed by paclitaxel 1, then Abraxane (x8?)  further treatments discontinued because of neuropathy.  (3) status post left lumpectomy with oncoplasty and right reduction mammoplasty showing a residual T2 N1 invasive ductal carcinoma, with lymphovascular invasion, but negative margins; the tumor is now HER-2 positive.  (a) postop PET scan showed no evidence of metastatic disease.  (4) adjuvant trastuzumab/pertuzumab started 06/20/2017 (received at [facility]) started here on 07/11/2017--continue  through August 2019  (a) Echo on 06/18/2017 at [place]: LVEF 55-60%  (b) echocardiogram 09/26/2017 shows an ejection fraction in the 60-65% range.  (c) echocardiogram 12/26/2017 shows an ejection fraction in the 55-60% range  (5) adjuvant radiation to follow  (6) anti-estrogens: Letrozole daily started 03/26/2017  (a) DEXA on 04/03/2017 demonstrated a T score of -1.3 in the right femoral neck  (b) Zometa given at [facility] on 06/20/2017  RECURRENCE: (7) Patient developed left inner breast nodule and underwent mammogram and ultrasound on 08/06/17 showing three areas of concern at 10 o'clock, 1130 o'clock, and at 8 o'clock. Biopsy on 08/07/2017 that demonstrated at 11:30 fat necrosis, however at 10:00 it was positive for IDC, grade 3, ER+(50%), PR-(0%), Ki-67 90%, HER-2 negative (ratio 1.48).  (a) staging chest CT 08/15/2017 shows 0.5 cm left lower lobe lung nodule of uncertain significance  (8) status post left modified radical mastectomy and right simple mastectomy 08/25/2017 showing  (a) right breast, no evidence of malignancy  (b) left breast, ypT2 ypN1 invasive ductal carcinoma, grade 3, with negative margins; repeat prognostic panel: Triple negative  (9) started adjuvant chemotherapy with cyclophosphamide/methotrexate/fluorouracil [CMF] 11/10/2016, 8 cycles planned  (a) will continue trastuzumab/Pertuzumab concurrently  (b) CMF chemotherapy discontinued after 4 cycles with disease progression  (10) status post left chest wall biopsy for further local recurrence 01/05/2018, the tumor being now weakly estrogen receptor positive, progesterone receptor negative, and again HER-2 negative  (11) PET scan scheduled for 02/25/2018  (12) adjuvant radiation to follow: To be given with capecitabine sensitization  (13) r consider antiestrogens at the completion of chemotherapy versus additional chemotherapy  (14) foundation one requested on 01/09/2018   PLAN:   Jamilex's situation is complex.   Basically she  has a double headed cancer.  As far as the HER-2 positive portion we are going to continue the trastuzumab and Pertuzumab for a minimum of a year.  She will receive a dose today and then her next dose will be 01/29/2018.  She will then be going to [facility] to visit her daughter.  She is being set up with medical oncology there and they can continue the trastuzumab and Pertuzumab there.  The more worrisome part of her cancer is the HER-2 negative part.  This is now also weakly estrogen receptor positive and progesterone receptor negative, indicating that antiestrogens may be of some help but certainly cannot be the main way to treat this tumor.  Unfortunately this cancer has already grown through cyclophosphamide doxorubicin paclitaxel and CMF chemotherapy.  This is not favorable.  She needs to complete the left chest wall surgery.  She is already scheduled for a skin graft 01/12/2018 and she likely will have a wound VAC again placed at that time.  The plan would be to proceed to adjuvant radiation as soon as the patient is able to receive it and I would do that with capecitabine sensitization.  However the patient is going to be in [place] as noted above.  She does not want to start radiation and she does not want to have a restaging PET scan until after she comes back.  This will be sometime in May.  Assuming the PET scan does not show metastatic disease and that the patient is able to complete her radiation, the question is what to do after that.  Certainly we are going to be continuing the anti-HER-2 treatment as just noted.  One possibility would be to switch to antiestrogens, and other to try different kind of chemo.  A third possibility is to see if there is a vulnerability in this tumor that might be uncovered through foundation 1 and I have requested that test to be sent today  Incidentally I have never received a copy of the patient's prior genetics testing.  I have asked  her to make sure we obtain that before her next visit with me.  We discussed the fact that there will be some delay in her staging and treatment because of the [place] trip but the patient is very intent in doing that.  We are honoring her wishes. [name], [name], MD  01/09/18 9:53 AM Medical Oncology and Hematology [facility] [address] [place], [place] [address] Tel. [phone]    Fax. [phone]  This document serves as a record of services personally performed by Chauncey Cruel, MD. It was created on his behalf by Margit Banda, a trained medical scribe. The creation of this record is based on the scribe's personal observations and the provider's statements to them.   I have reviewed the above documentation for accuracy and completeness, and I agree with the above.

## 2018-01-02 ENCOUNTER — Other Ambulatory Visit: Payer: Self-pay | Admitting: Oncology

## 2018-01-02 ENCOUNTER — Ambulatory Visit
Admission: RE | Admit: 2018-01-02 | Discharge: 2018-01-02 | Disposition: A | Discharge status: Home / Self Care | Payer: BLUE CROSS/BLUE SHIELD | Source: Ambulatory Visit | Attending: Oncology | Admitting: Oncology

## 2018-01-02 DIAGNOSIS — N6459 Other signs and symptoms in breast: Secondary | ICD-10-CM

## 2018-01-02 DIAGNOSIS — Z170 Estrogen receptor positive status [ER+]: Principal | ICD-10-CM

## 2018-01-02 DIAGNOSIS — N6320 Unspecified lump in the left breast, unspecified quadrant: Secondary | ICD-10-CM

## 2018-01-02 DIAGNOSIS — C50212 Malignant neoplasm of upper-inner quadrant of left female breast: Secondary | ICD-10-CM

## 2018-01-05 ENCOUNTER — Ambulatory Visit
Admission: RE | Admit: 2018-01-05 | Discharge: 2018-01-05 | Disposition: A | Discharge status: Home / Self Care | Payer: BLUE CROSS/BLUE SHIELD | Source: Ambulatory Visit | Attending: Oncology | Admitting: Oncology

## 2018-01-05 DIAGNOSIS — N6320 Unspecified lump in the left breast, unspecified quadrant: Secondary | ICD-10-CM

## 2018-01-05 DIAGNOSIS — C50212 Malignant neoplasm of upper-inner quadrant of left female breast: Secondary | ICD-10-CM

## 2018-01-05 DIAGNOSIS — Z170 Estrogen receptor positive status [ER+]: Principal | ICD-10-CM

## 2018-01-06 ENCOUNTER — Encounter: Payer: Self-pay | Admitting: Oncology

## 2018-01-06 ENCOUNTER — Other Ambulatory Visit: Payer: Self-pay | Admitting: Oncology

## 2018-01-06 NOTE — Progress Notes (Signed)
I called [name] and let her know the results of her pathology, which unfortunately show cancer is back.  I do not yet know if this is HER-2 positive or not.  I did send a note to her surgeon (I was unable to get through the phone system) letting him know that I would favor resection followed by radiation.  I have also discussed this with Dr. Dorathy Daft her radiation physician

## 2018-01-08 ENCOUNTER — Telehealth: Payer: Self-pay | Admitting: Oncology

## 2018-01-08 NOTE — Telephone Encounter (Signed)
FAXED MEDICAL RECORDS TO [facility] RELEASE ID [id number]

## 2018-01-09 ENCOUNTER — Inpatient Hospital Stay (HOSPITAL_BASED_OUTPATIENT_CLINIC_OR_DEPARTMENT_OTHER): Payer: BLUE CROSS/BLUE SHIELD | Admitting: Oncology

## 2018-01-09 ENCOUNTER — Inpatient Hospital Stay: Payer: BLUE CROSS/BLUE SHIELD

## 2018-01-09 ENCOUNTER — Telehealth: Payer: Self-pay | Admitting: *Deleted

## 2018-01-09 ENCOUNTER — Telehealth: Payer: Self-pay | Admitting: Oncology

## 2018-01-09 VITALS — BP 175/87 | HR 84 | Temp 98.4°F | Resp 18 | Ht 69.0 in | Wt 209.4 lb

## 2018-01-09 DIAGNOSIS — C50912 Malignant neoplasm of unspecified site of left female breast: Secondary | ICD-10-CM

## 2018-01-09 DIAGNOSIS — C50212 Malignant neoplasm of upper-inner quadrant of left female breast: Secondary | ICD-10-CM | POA: Diagnosis not present

## 2018-01-09 DIAGNOSIS — Z170 Estrogen receptor positive status [ER+]: Secondary | ICD-10-CM

## 2018-01-09 DIAGNOSIS — Z5112 Encounter for antineoplastic immunotherapy: Secondary | ICD-10-CM | POA: Diagnosis not present

## 2018-01-09 LAB — COMPREHENSIVE METABOLIC PANEL
ALT: 11 U/L (ref 0–55)
ANION GAP: 7 (ref 3–11)
AST: 12 U/L (ref 5–34)
Albumin: 4 g/dL (ref 3.5–5.0)
Alkaline Phosphatase: 129 U/L (ref 40–150)
BILIRUBIN TOTAL: 0.3 mg/dL (ref 0.2–1.2)
BUN: 13 mg/dL (ref 7–26)
CO2: 26 mmol/L (ref 22–29)
Calcium: 9.6 mg/dL (ref 8.4–10.4)
Chloride: 108 mmol/L (ref 98–109)
Creatinine, Ser: 0.82 mg/dL (ref 0.60–1.10)
Glucose, Bld: 109 mg/dL (ref 70–140)
POTASSIUM: 5.1 mmol/L (ref 3.5–5.1)
Sodium: 141 mmol/L (ref 136–145)
TOTAL PROTEIN: 6.9 g/dL (ref 6.4–8.3)

## 2018-01-09 LAB — CBC WITH DIFFERENTIAL/PLATELET
Basophils Absolute: 0.1 10*3/uL (ref 0.0–0.1)
Basophils Relative: 1 %
Eosinophils Absolute: 0.1 10*3/uL (ref 0.0–0.5)
Eosinophils Relative: 1 %
HEMATOCRIT: 39.7 % (ref 34.8–46.6)
Hemoglobin: 12.4 g/dL (ref 11.6–15.9)
LYMPHS PCT: 9 %
Lymphs Abs: 1.1 10*3/uL (ref 0.9–3.3)
MCH: 26.9 pg (ref 25.1–34.0)
MCHC: 31.2 g/dL — AB (ref 31.5–36.0)
MCV: 86.1 fL (ref 79.5–101.0)
MONO ABS: 1.2 10*3/uL — AB (ref 0.1–0.9)
MONOS PCT: 10 %
NEUTROS ABS: 9.4 10*3/uL — AB (ref 1.5–6.5)
Neutrophils Relative %: 79 %
Platelets: 308 10*3/uL (ref 145–400)
RBC: 4.61 MIL/uL (ref 3.70–5.45)
RDW: 17.8 % — AB (ref 11.2–14.5)
WBC: 11.8 10*3/uL — ABNORMAL HIGH (ref 3.9–10.3)

## 2018-01-09 MED ORDER — TRASTUZUMAB CHEMO 150 MG IV SOLR
6.0000 mg/kg | Freq: Once | INTRAVENOUS | Status: AC
  Administered 2018-01-09: 567 mg via INTRAVENOUS
  Filled 2018-01-09: qty 27

## 2018-01-09 MED ORDER — DIPHENHYDRAMINE HCL 25 MG PO CAPS
ORAL_CAPSULE | ORAL | Status: AC
  Filled 2018-01-09: qty 1

## 2018-01-09 MED ORDER — SODIUM CHLORIDE 0.9% FLUSH
10.0000 mL | INTRAVENOUS | Status: DC | PRN
  Administered 2018-01-09: 10 mL
  Filled 2018-01-09: qty 10

## 2018-01-09 MED ORDER — HEPARIN SOD (PORK) LOCK FLUSH 100 UNIT/ML IV SOLN
500.0000 [IU] | Freq: Once | INTRAVENOUS | Status: AC | PRN
  Administered 2018-01-09: 500 [IU]
  Filled 2018-01-09: qty 5

## 2018-01-09 MED ORDER — SODIUM CHLORIDE 0.9 % IV SOLN
Freq: Once | INTRAVENOUS | Status: AC
  Administered 2018-01-09: 11:00:00 via INTRAVENOUS

## 2018-01-09 MED ORDER — DIPHENHYDRAMINE HCL 25 MG PO CAPS
25.0000 mg | ORAL_CAPSULE | Freq: Once | ORAL | Status: AC
  Administered 2018-01-09: 25 mg via ORAL

## 2018-01-09 MED ORDER — SODIUM CHLORIDE 0.9 % IV SOLN
420.0000 mg | Freq: Once | INTRAVENOUS | Status: AC
  Administered 2018-01-09: 420 mg via INTRAVENOUS
  Filled 2018-01-09: qty 14

## 2018-01-09 MED ORDER — ACETAMINOPHEN 325 MG PO TABS
ORAL_TABLET | ORAL | Status: AC
  Filled 2018-01-09: qty 2

## 2018-01-09 MED ORDER — ACETAMINOPHEN 325 MG PO TABS
650.0000 mg | ORAL_TABLET | Freq: Once | ORAL | Status: AC
  Administered 2018-01-09: 650 mg via ORAL

## 2018-01-09 NOTE — Patient Instructions (Signed)
[facility] Discharge Instructions for Patients Receiving Chemotherapy  Today you received the following chemotherapy agents: Herceptin, Perjeta  To help prevent nausea and vomiting after your treatment, we encourage you to take your nausea medication as directed.   If you develop nausea and vomiting that is not controlled by your nausea medication, call the clinic.   BELOW ARE SYMPTOMS THAT SHOULD BE REPORTED IMMEDIATELY:  *FEVER GREATER THAN 100.5 F  *CHILLS WITH OR WITHOUT FEVER  NAUSEA AND VOMITING THAT IS NOT CONTROLLED WITH YOUR NAUSEA MEDICATION  *UNUSUAL SHORTNESS OF BREATH  *UNUSUAL BRUISING OR BLEEDING  TENDERNESS IN MOUTH AND THROAT WITH OR WITHOUT PRESENCE OF ULCERS  *URINARY PROBLEMS  *BOWEL PROBLEMS  UNUSUAL RASH Items with * indicate a potential emergency and should be followed up as soon as possible.  Feel free to call the clinic should you have any questions or concerns. The clinic phone number is [phone].  Please show the CHEMO ALERT CARD at check-in to the Emergency Department and triage nurse.   

## 2018-01-09 NOTE — Telephone Encounter (Signed)
This RN contacted [employer] speciality per need for additional clinical for prior authorization for perjeta and herceptin to be given today.  Note post lengthy ( over 1 hour ) of chart review per pt being diagnosed in December 2017 as triple neg and obtaining chemo neo adj at [facility] under Dr Georgiann Cocker - with lumpectomy performed in June 2018 showing residual disease now as HER 2+.  Pt began herceptin therapy at [place] [facility] - but then changed facilities to [facility] under Dr Jana Hakim.  Due to residual disease with high risk of recurrence additional chemo of CMF added with Perjeta to the pt's existing treatment with herceptin in September 2018.  Note pt had prior auth in place for herceptin/perjeta per [facility] and this office could not obtain until that prior auth was withdrawn.While above was pending - personnel who manages chemo pre certifications was transferred - above not followed up.  There was also authorization that showed Dr Jana Hakim as billing for the chemo services not [facility].  Pt was marked at HER 2 neg ( which was based on biopsy path not surgical path ) this was corrected.  Additional clinical needed regarding " risk of recurrence not documented "- this RN obtained from Dr Jana Hakim directly that pt was at high risk of recurrence with pathology from June 2018.  Per this RN's discussion with [employer] Caryl Ada RN) able to obtain authorization starting 01/09/2018 valid thru 06/18/2018 for CMF / herceptin / perjeta and aloxi.  Authorization number [id number].  Of note above is authorized to be billed under [facility] - not under Dr Jana Hakim.  Caryl Ada could not obtain authorization for retro therapy but patient or billing needs to contact number on back of pt's insurance card to initiate this request.

## 2018-01-09 NOTE — Progress Notes (Signed)
Pt refused to stay for 30 minutes observation post perjeta.

## 2018-01-09 NOTE — Telephone Encounter (Signed)
Gave patient AVS and calendar of upcoming may appointments.  °

## 2018-01-13 ENCOUNTER — Telehealth: Payer: Self-pay

## 2018-01-13 ENCOUNTER — Other Ambulatory Visit: Payer: Self-pay

## 2018-01-13 DIAGNOSIS — Z170 Estrogen receptor positive status [ER+]: Principal | ICD-10-CM

## 2018-01-13 DIAGNOSIS — C50212 Malignant neoplasm of upper-inner quadrant of left female breast: Secondary | ICD-10-CM

## 2018-01-13 MED ORDER — ALPRAZOLAM ER 1 MG PO TB24: 1.0000 mg | ORAL_TABLET | Freq: Every day | ORAL | 1 refills | Status: DC

## 2018-01-13 NOTE — Telephone Encounter (Signed)
Scheduling message sent for patients next appts to be rescheduled since she will be going out of town.  Cyndia Bent RN

## 2018-01-14 ENCOUNTER — Other Ambulatory Visit: Payer: Self-pay | Admitting: *Deleted

## 2018-01-16 ENCOUNTER — Ambulatory Visit: Payer: BLUE CROSS/BLUE SHIELD

## 2018-01-16 ENCOUNTER — Other Ambulatory Visit: Payer: BLUE CROSS/BLUE SHIELD

## 2018-01-23 ENCOUNTER — Telehealth: Payer: Self-pay | Admitting: Oncology

## 2018-01-23 NOTE — Telephone Encounter (Signed)
Patient called to cancel she is having treatment in [place]. Per [employer]

## 2018-01-27 ENCOUNTER — Ambulatory Visit: Payer: BLUE CROSS/BLUE SHIELD

## 2018-01-27 ENCOUNTER — Other Ambulatory Visit: Payer: BLUE CROSS/BLUE SHIELD

## 2018-01-29 ENCOUNTER — Ambulatory Visit: Payer: BLUE CROSS/BLUE SHIELD

## 2018-01-29 ENCOUNTER — Other Ambulatory Visit: Payer: BLUE CROSS/BLUE SHIELD

## 2018-02-01 ENCOUNTER — Other Ambulatory Visit: Payer: Self-pay | Admitting: Oncology

## 2018-02-01 NOTE — Progress Notes (Signed)
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  In particular the microsatellite status was stable and the tumor mutational burden was low.

## 2018-02-02 ENCOUNTER — Other Ambulatory Visit: Payer: Self-pay | Admitting: Oncology

## 2018-02-02 NOTE — Progress Notes (Signed)
[facility]  [phone] [phone]     ID: [name] DOB: 03-Apr-1956  MR#: [id number]  EXB#:[id number]  Patient Care Team: Chesley Noon, MD as PCP - General (Family Medicine) Excell Seltzer, MD as Consulting Physician (General Surgery) Wilhemenia Camba, [name], MD as Consulting Physician (Oncology) Gery Pray, MD as Consulting Physician (Radiation Oncology) Collene Gobble, MD as Consulting Physician (Pulmonary Disease) Verdell Carmine, MD as Referring Physician (Internal Medicine) Larey Dresser, MD as Consulting Physician (Cardiology) Beverely Pace, MD as Referring Physician (Surgical Oncology) OTHER MD:  CHIEF COMPLAINT: estrogen receptor positive, HER-2 positive breast cancer  CURRENT TREATMENT: Adjuvant Trastuzumab/ Pertuzumab    BREAST CANCER HISTORY: From the original intake note:  "[name]" had a fall while traveling living to [place] in their RV and injure her left upper arm and  her left breast . Approximately 3 weeks later she noted a mass in the left breast upper inner quadrant. It was somewhat tender. She brought this to medical attention and on 09/27/2016 underwent bilateral diagnostic mammography with tomography [facility] The breast density was category B. The right breast was benign. In the left breast upper inner quadrant there was a large lobulated hyperdense mass which by exam was firm and nonmobile. By ultrasonography in the 11:30 o'clock radius 4 cm from the nipple there was a mass extending to the subcutaneous tissues and measuring 4.9 cm. There was a small superficial fluid collection overlying this consistent with a hematoma. The left axilla was benign.  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  The patient's subsequent history is as detailed below  INTERVAL HISTORY: Caelan returns today for follow-up and treatment of her biphenotypic cancer. She is accompanied by her husband.  After her last visit here we set her up for biopsy of what proved to be a further local recurrence to the left chest wall, on 01/05/2018.  This showed (SAA 19-11/27/2006) invasive ductal carcinoma, grade 3, weakly estrogen receptor positive at 70%, progesterone receptor negative, with an MIB-1 of 90%, and no HER-2 amplification, the signals ratio being 1.57 and the number per cell 2.90.  She then proceeded to excision of this lesion under Dr. Adair Laundry.  This was performed 01/08/2018.  She will need skin grafting and very likely a wound VAC which is scheduled to be placed on 01/12/2018.  Jayme is here today to discuss those results.  REVIEW OF SYSTEMS: Gloristine is doing well. She reports having a normal bowel movement yesterday morning, but notes that might change since taking the hydrocodone for her recent surgery, yesterday 01/08/2018. Emilynn tolerated surgery well yesterday, but endorses some pain last night. She has been nervous to take hydrocodone in fear of becoming addicted to it. On Monday, 01/12/2018, she will undergo a skin graft. She denies unusual headaches, visual changes, nausea, vomiting, or dizziness. There has been no unusual cough, phlegm production, or pleurisy. This been no change in bowel or bladder habits. She denies unexplained fatigue or unexplained weight loss, bleeding, rash, or fever. A detailed review of systems was otherwise noncontributory.   PAST MEDICAL HISTORY: Past Medical History:  Diagnosis Date  . Anxiety   . CAP (community acquired pneumonia)   . Depression   .  Malignant neoplasm of upper-inner quadrant of left female breast ([place]) 10/01/2016  . Personal history of chemotherapy   . Tobacco  use     PAST SURGICAL HISTORY: Past Surgical History:  Procedure Laterality Date  . ABDOMINAL HYSTERECTOMY  2011   Laparoscopic  . AUGMENTATION MAMMAPLASTY Bilateral 03/2017  . BREAST LUMPECTOMY Left 04/05/2017  . BREAST LUMPECTOMY WITH SENTINEL LYMPH NODE BIOPSY Left 04/07/2017  . BREAST REDUCTION SURGERY Left 04/07/2017  . CHOLECYSTECTOMY    . MASTECTOMY Bilateral 08/25/2017   at [facility]  . open reduction left ankle    . SKIN GRAFT Left 06/19/2017   bil skin grafts to breasts    FAMILY HISTORY Family History  Problem Relation Age of Onset  . Heart disease Mother   . Heart disease Father   . Cancer Sister        ovarian ca  the patient's father died at the age of 58 from a myocardial infarction. The patient's mother died at the age of 28 with congestive heart failure. The patient had one brother, 2 sisters. One of her sisters was diagnosed with ovarian cancer at age 32 and died at age 79 from that disease  GYNECOLOGIC HISTORY:  No LMP recorded. Patient has had a hysterectomy. Menarche age 62, first live birth age 62. The patient is GX P1. She stopped having periods approxiately 2008. She did not take hormone replacement. She used oral contraceptives for approxi 30 years with no complications.  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 Her husband [name] is retired from [employer]. Their daughter Allecia Bells lives in [place] and is Mudlogger of the [employer] in [employer].     ADVANCED DIRECTIVES: not in place   HEALTH MAINTENANCE: Social History   Tobacco Use  . Smoking status: Former Smoker    Packs/day: 0.50    Years: 20.00    Pack years: 10.00    Last attempt to quit: 10/21/2016    Years since quitting: 1.2  . Smokeless tobacco: Never Used  Substance Use Topics  . Alcohol use: No  . Drug use: No     Colonoscopy:  PAP:  Bone density:   No Known Allergies  Current Outpatient Medications   Medication Sig Dispense Refill  . acetaminophen (TYLENOL) 325 MG tablet Take 650 mg by mouth every 6 (six) hours as needed for moderate pain.     Marland Kitchen ALPRAZolam (XANAX XR) 1 MG 24 hr tablet Take 1 tablet (1 mg total) by mouth daily. 30 tablet 1  . busPIRone (BUSPAR) 30 MG tablet Take 15 mg by mouth daily.     . diphenhydrAMINE (BENADRYL) 25 MG tablet Take 50 mg by mouth as needed for sleep.     Marland Kitchen ibuprofen (ADVIL,MOTRIN) 200 MG tablet Take 600 mg by mouth every 6 (six) hours as needed for moderate pain.     Marland Kitchen nystatin (MYCOSTATIN) 100000 UNIT/ML suspension Take 5 mLs (500,000 Units total) by mouth daily as needed (ulcers). 240 mL 6  . PARoxetine (PAXIL) 40 MG tablet Take 40 mg by mouth every morning.    . ranitidine (ZANTAC) 150 MG tablet Take 150 mg by mouth daily as needed for heartburn.     Marland Kitchen tiZANidine (ZANAFLEX) 4 MG tablet TAKE 1 TABLET BY MOUTH Bedtime    . valACYclovir (VALTREX) 1000 MG tablet Take 1 tablet (1,000 mg total) by mouth daily. 90 tablet 2   No current facility-administered  medications for this visit.    Facility-Administered Medications Ordered in Other Visits  Medication Dose Route Frequency Provider Last Rate Last Dose  . heparin lock flush 100 unit/mL  500 Units Intracatheter Once PRN Colbi Schiltz, [name], MD         OBJECTIVE: Middle-aged white woman who appears stated age  There were no vitals filed for this visit.   There is no height or weight on file to calculate BMI.   There were no vitals filed for this visit.   ECOG FS:1 - Symptomatic but completely ambulatory   Sclerae unicteric, EOMs intact Oropharynx clear and moist No cervical or supraclavicular adenopathy Lungs no rales or rhonchi Heart regular rate and rhythm Abd soft, nontender, positive bowel sounds MSK no focal spinal tenderness, no upper extremity lymphedema Neuro: nonfocal, well oriented, appropriate affect Breasts: Status post bilateral mastectomies.  On the left the patient is status post  recent surgery in the area is thoroughly bandaged.  It was not uncovered  LAB RESULTS:  CMP     Component Value Date/Time   NA 141 01/09/2018 0920   NA 140 10/24/2017 1307   K 5.1 01/09/2018 0920   K 5.4 (H) 10/24/2017 1307   CL 108 01/09/2018 0920   CO2 26 01/09/2018 0920   CO2 29 10/24/2017 1307   GLUCOSE 109 01/09/2018 0920   GLUCOSE 103 10/24/2017 1307   BUN 13 01/09/2018 0920   BUN 18.2 10/24/2017 1307   CREATININE 0.82 01/09/2018 0920   CREATININE 0.8 10/24/2017 1307   CALCIUM 9.6 01/09/2018 0920   CALCIUM 10.3 10/24/2017 1307   PROT 6.9 01/09/2018 0920   PROT 7.5 10/24/2017 1307   ALBUMIN 4.0 01/09/2018 0920   ALBUMIN 4.0 10/24/2017 1307   AST 12 01/09/2018 0920   AST 9 10/24/2017 1307   ALT 11 01/09/2018 0920   ALT 8 10/24/2017 1307   ALKPHOS 129 01/09/2018 0920   ALKPHOS 115 10/24/2017 1307   BILITOT 0.3 01/09/2018 0920   BILITOT 0.28 10/24/2017 1307   GFRNONAA >60 01/09/2018 0920   GFRAA >60 01/09/2018 0920    INo results found for: SPEP, UPEP  Lab Results  Component Value Date   WBC 11.8 (H) 01/09/2018   NEUTROABS 9.4 (H) 01/09/2018   HGB 12.4 01/09/2018   HCT 39.7 01/09/2018   MCV 86.1 01/09/2018   PLT 308 01/09/2018      Chemistry      Component Value Date/Time   NA 141 01/09/2018 0920   NA 140 10/24/2017 1307   K 5.1 01/09/2018 0920   K 5.4 (H) 10/24/2017 1307   CL 108 01/09/2018 0920   CO2 26 01/09/2018 0920   CO2 29 10/24/2017 1307   BUN 13 01/09/2018 0920   BUN 18.2 10/24/2017 1307   CREATININE 0.82 01/09/2018 0920   CREATININE 0.8 10/24/2017 1307      Component Value Date/Time   CALCIUM 9.6 01/09/2018 0920   CALCIUM 10.3 10/24/2017 1307   ALKPHOS 129 01/09/2018 0920   ALKPHOS 115 10/24/2017 1307   AST 12 01/09/2018 0920   AST 9 10/24/2017 1307   ALT 11 01/09/2018 0920   ALT 8 10/24/2017 1307   BILITOT 0.3 01/09/2018 0920   BILITOT 0.28 10/24/2017 1307       No results found for: LABCA2  No components found for:  LABCA125  No results for input(s): INR in the last 168 hours.  Urinalysis    Component Value Date/Time   COLORURINE STRAW (A) 10/30/2017 2050  APPEARANCEUR CLEAR 10/30/2017 2050   LABSPEC 1.006 10/30/2017 2050   [place] 6.0 10/30/2017 2050   GLUCOSEU NEGATIVE 10/30/2017 2050   [place] NEGATIVE 10/30/2017 2050   [place] 10/30/2017 2050   [place] 10/30/2017 2050   PROTEINUR NEGATIVE 10/30/2017 2050   NITRITE NEGATIVE 10/30/2017 2050   LEUKOCYTESUR NEGATIVE 10/30/2017 2050     STUDIES: [place] Lt Breast Bx W Loc Dev 1st Lesion Img Bx Spec US Guide  Addendum Date: 01/06/2018   ADDENDUM REPORT: 01/06/2018 10:34 ADDENDUM: Pathology revealed GRADE III INVASIVE DUCTAL CARCINOMA, LYMPHOVASCULAR INVASION IS IDENTIFIED of the Left breast, chest wall. The carcinoma involves skeletal muscle. This was found to be concordant by Dr. Everlean Alstrom. Pathology results were discussed with the patient by telephone. The patient reported doing well after the biopsy with tenderness at the site. Post biopsy instructions and care were reviewed and questions were answered. The patient was encouraged to call [facility] for any additional concerns. The patient has a recent diagnosis of left breast cancer and should follow her outlined treatment plan. Imaging and pathology reports were faxed to Dr. Beverely Pace of [facility] on January 06, 2018. Pathology results were sent to Dr. Tressa Busman at [facility] via [facility] message. Pathology results reported by Terie Purser, RN on 01/06/2018. Electronically Signed   By: Everlean Alstrom M.D.   On: 01/06/2018 10:34   Result Date: 01/06/2018 CLINICAL DATA:  62 year old female with history of left breast cancer post lumpectomy 04/07/2017 and subsequent mastectomy 08/25/2017 presents with a new suspicious palpable abnormality adjacent to the mastectomy scar. EXAM: ULTRASOUND GUIDED LEFT  BREAST CORE NEEDLE BIOPSY COMPARISON:  Previous exam(s). FINDINGS: I met with the patient and we discussed the procedure of ultrasound-guided biopsy, including benefits and alternatives. We discussed the high likelihood of a successful procedure. We discussed the risks of the procedure, including infection, bleeding, tissue injury, clip migration, and inadequate sampling. Informed written consent was given. The usual time-out protocol was performed immediately prior to the procedure. Lesion quadrant: Upper-outer Using sterile technique and 1% Lidocaine as local anesthetic, under direct ultrasound visualization, a 14 gauge spring-loaded device was used to perform biopsy of the palpable chest wall mass along the lateral margin of the mastectomy scar using a lateral to medial approach. At the conclusion of the procedure a heart shaped tissue marker clip was deployed into the biopsy cavity. Post biopsy mammogram was not performed. IMPRESSION: Ultrasound guided biopsy of the palpable chest wall mass along the lateral margin of the mastectomy scar. No apparent complications. Electronically Signed: By: Everlean Alstrom M.D. On: 01/05/2018 09:21     ELIGIBLE FOR AVAILABLE RESEARCH PROTOCOL: no  ASSESSMENT: 62 y.o. [employer], [place] woman status post left breast upper inner quadrant biopsy 09/30/2016 for a clinical T2 N0, stage II a invasive ductal carcinoma, grade 3, estrogen receptor positive, progesterone receptor negative, HER-2 not amplified, with an MIB-1 of 80%.  (1) genetics testing pending  (2) neoadjuvant chemotherapy given between 10/30/2016 and 03/12/2017 consisting of cyclophosphamide and doxorubicin in dose dense fashion 4  followed by paclitaxel 1, then Abraxane (x8?)  further treatments discontinued because of neuropathy.  (3) status post left lumpectomy with oncoplasty and right reduction mammoplasty showing a residual T2 N1 invasive ductal carcinoma, with lymphovascular invasion, but negative  margins; the tumor is now HER-2 positive.  (a) postop PET scan showed no evidence of metastatic disease.  (4) adjuvant trastuzumab/pertuzumab started 06/20/2017 (received at  [facility]) started here on 07/11/2017--continue through August 2019  (a) Echo on 06/18/2017 at [place]: LVEF 55-60%  (b) echocardiogram 09/26/2017 shows an ejection fraction in the 60-65% range.  (c) echocardiogram 12/26/2017 shows an ejection fraction in the 55-60% range  (5) adjuvant radiation to follow  (6) anti-estrogens: Letrozole daily started 03/26/2017  (a) DEXA on 04/03/2017 demonstrated a T score of -1.3 in the right femoral neck  (b) Zometa given at [facility] on 06/20/2017  RECURRENCE: (7) Patient developed left inner breast nodule and underwent mammogram and ultrasound on 08/06/17 showing three areas of concern at 10 o'clock, 1130 o'clock, and at 8 o'clock. Biopsy on 08/07/2017 that demonstrated at 11:30 fat necrosis, however at 10:00 it was positive for IDC, grade 3, ER+(50%), PR-(0%), Ki-67 90%, HER-2 negative (ratio 1.48).  (a) staging chest CT 08/15/2017 shows 0.5 cm left lower lobe lung nodule of uncertain significance  (8) status post left modified radical mastectomy and right simple mastectomy 08/25/2017 showing  (a) right breast, no evidence of malignancy  (b) left breast, ypT2 ypN1 invasive ductal carcinoma, grade 3, with negative margins; repeat prognostic panel: Triple negative  (9) started adjuvant chemotherapy with cyclophosphamide/methotrexate/fluorouracil [CMF] 11/10/2016, 8 cycles planned  (a) will continue trastuzumab/Pertuzumab concurrently  (b) CMF chemotherapy discontinued after 4 cycles with disease progression  (10) status post left chest wall biopsy for further local recurrence 01/05/2018, the tumor being now weakly estrogen receptor positive, progesterone receptor negative, and again HER-2 negative  (11) PET scan scheduled for 02/25/2018  (12) adjuvant radiation to follow: To be given with  capecitabine sensitization  (13) r consider antiestrogens at the completion of chemotherapy versus additional chemotherapy  (14) foundation one 01/09/2018 shows no actionable mutations, with stable microsatellite status and low mutational burden.  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  (a) PD-L1 testing requested 02/02/2018   PLAN:   Kassadi's situation is complex.  Basically she has a double headed cancer.  As far as the HER-2 positive portion we are going to continue the trastuzumab and Pertuzumab for a minimum of a year.  She will receive a dose today and then her next dose will be 01/29/2018.  She will then be going to [facility] to visit her daughter.  She is being set up with medical oncology there and they can continue the trastuzumab and Pertuzumab there.  The more worrisome part of her cancer is the HER-2 negative part.  This is now also weakly estrogen receptor positive and progesterone receptor negative, indicating that antiestrogens may be of some help but certainly cannot be the main way to treat this tumor.  Unfortunately this cancer has already grown through cyclophosphamide doxorubicin paclitaxel and CMF chemotherapy.  This is not favorable.  She needs to complete the left chest wall surgery.  She is already scheduled for a skin graft 01/12/2018 and she likely will have a wound VAC again placed at that time.  The plan would be to proceed to adjuvant radiation as soon as the patient is able to receive it and I would do that with capecitabine sensitization.  However the patient is going to be in [place] as noted above.  She does not want to start radiation and she does not want to have a restaging PET scan until after she comes back.  This will be sometime in May.  Assuming the PET scan does not show metastatic disease and that the patient is able to complete her radiation, the question is what to do after that.  Certainly we are  going to be continuing the anti-HER-2 treatment as just noted.   One possibility would be to switch to antiestrogens, and other to try different kind of chemo.  A third possibility is to see if there is a vulnerability in this tumor that might be uncovered through foundation 1 and I have requested that test to be sent today  Incidentally I have never received a copy of the patient's prior genetics testing.  I have asked her to make sure we obtain that before her next visit with me.  We discussed the fact that there will be some delay in her staging and treatment because of the [place] trip but the patient is very intent in doing that.  We are honoring her wishes. Bauer Ausborn, [name], MD  02/02/18 1:23 PM Medical Oncology and Hematology [facility] [address] [place], [place] [address] Tel. [phone]    Fax. [phone]  This document serves as a record of services personally performed by Chauncey Cruel, MD. It was created on his behalf by Margit Banda, a trained medical scribe. The creation of this record is based on the scribe's personal observations and the provider's statements to them.   I have reviewed the above documentation for accuracy and completeness, and I agree with the above.

## 2018-02-05 ENCOUNTER — Encounter (HOSPITAL_COMMUNITY): Payer: Self-pay | Admitting: Oncology

## 2018-02-06 ENCOUNTER — Other Ambulatory Visit: Payer: BLUE CROSS/BLUE SHIELD

## 2018-02-06 ENCOUNTER — Ambulatory Visit: Payer: BLUE CROSS/BLUE SHIELD

## 2018-02-27 ENCOUNTER — Telehealth: Payer: Self-pay | Admitting: Oncology

## 2018-02-27 NOTE — Telephone Encounter (Signed)
Scheduled appt per 5/9 sch msg - spoke w/ pt re appts.  °

## 2018-03-02 ENCOUNTER — Inpatient Hospital Stay: Payer: BLUE CROSS/BLUE SHIELD | Admitting: Oncology

## 2018-03-02 ENCOUNTER — Inpatient Hospital Stay: Payer: BLUE CROSS/BLUE SHIELD

## 2018-03-10 ENCOUNTER — Other Ambulatory Visit: Payer: Self-pay | Admitting: *Deleted

## 2018-03-10 DIAGNOSIS — Z170 Estrogen receptor positive status [ER+]: Principal | ICD-10-CM

## 2018-03-10 DIAGNOSIS — C50212 Malignant neoplasm of upper-inner quadrant of left female breast: Secondary | ICD-10-CM

## 2018-03-10 MED ORDER — ALPRAZOLAM ER 1 MG PO TB24: 1.0000 mg | ORAL_TABLET | Freq: Every day | ORAL | 1 refills | Status: DC

## 2018-03-12 ENCOUNTER — Other Ambulatory Visit: Payer: Self-pay

## 2018-03-12 ENCOUNTER — Telehealth: Payer: Self-pay

## 2018-03-12 DIAGNOSIS — Z170 Estrogen receptor positive status [ER+]: Principal | ICD-10-CM

## 2018-03-12 DIAGNOSIS — C50212 Malignant neoplasm of upper-inner quadrant of left female breast: Secondary | ICD-10-CM

## 2018-03-12 MED ORDER — ALPRAZOLAM ER 1 MG PO TB24: 1.0000 mg | ORAL_TABLET | Freq: Every day | ORAL | 0 refills | Status: DC

## 2018-03-12 NOTE — Telephone Encounter (Signed)
Informed pt prescription was sent to pharmacy in [place] that she provided where she is visiting for her xanax. 30 tablets. No refills.  Cyndia Bent RN

## 2018-03-23 ENCOUNTER — Ambulatory Visit: Payer: Self-pay | Admitting: Radiation Oncology

## 2018-03-25 ENCOUNTER — Ambulatory Visit (HOSPITAL_COMMUNITY)
Admission: RE | Admit: 2018-03-25 | Discharge: 2018-03-25 | Disposition: A | Discharge status: Home / Self Care | Payer: BLUE CROSS/BLUE SHIELD | Source: Ambulatory Visit | Attending: Oncology | Admitting: Oncology

## 2018-03-25 DIAGNOSIS — C50912 Malignant neoplasm of unspecified site of left female breast: Secondary | ICD-10-CM | POA: Diagnosis present

## 2018-03-25 DIAGNOSIS — Z170 Estrogen receptor positive status [ER+]: Secondary | ICD-10-CM | POA: Insufficient documentation

## 2018-03-25 DIAGNOSIS — Z9012 Acquired absence of left breast and nipple: Secondary | ICD-10-CM | POA: Insufficient documentation

## 2018-03-25 DIAGNOSIS — C50212 Malignant neoplasm of upper-inner quadrant of left female breast: Secondary | ICD-10-CM | POA: Insufficient documentation

## 2018-03-25 LAB — GLUCOSE, CAPILLARY: Glucose-Capillary: 130 mg/dL — ABNORMAL HIGH (ref 65–99)

## 2018-03-25 MED ORDER — FLUDEOXYGLUCOSE F - 18 (FDG) INJECTION
10.4000 | Freq: Once | INTRAVENOUS | Status: AC | PRN
  Administered 2018-03-25: 10.4 via INTRAVENOUS

## 2018-03-26 NOTE — Progress Notes (Signed)
[place]  [phone] Fax:([phone]) [phone]     ID: [name] DOB: October 06, 1956  MR#: [id number]  HYQ#:[id number]  Patient Care Team: Chesley Noon, MD as PCP - General (Family Medicine) Excell Seltzer, MD as Consulting Physician (General Surgery) Geneviene Tesch, [name], MD as Consulting Physician (Oncology) Gery Pray, MD as Consulting Physician (Radiation Oncology) Collene Gobble, MD as Consulting Physician (Pulmonary Disease) Verdell Carmine, MD as Referring Physician (Internal Medicine) Larey Dresser, MD as Consulting Physician (Cardiology) Beverely Pace, MD as Referring Physician (Surgical Oncology) OTHER MD: Dr Lyda Jester [phone])  CHIEF COMPLAINT: estrogen receptor positive, HER-2 positive breast cancer  CURRENT TREATMENT: Adjuvant Trastuzumab/ Pertuzumab, letrozole, zolendronate   BREAST CANCER HISTORY: From the original intake note:  "[name]" had a fall while traveling living to [place] in their RV and injure her left upper arm and  her left breast . Approximately 3 weeks later she noted a mass in the left breast upper inner quadrant. It was somewhat tender. She brought this to medical attention and on 09/27/2016 underwent bilateral diagnostic mammography with tomography [facility] The breast density was category B. The right breast was benign. In the left breast upper inner quadrant there was a large lobulated hyperdense mass which by exam was firm and nonmobile. By ultrasonography in the 11:30 o'clock radius 4 cm from the nipple there was a mass extending to the subcutaneous tissues and measuring 4.9 cm. There was a small superficial fluid collection overlying this consistent with a hematoma. The left axilla was benign.  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  The patient's subsequent history is as detailed below  INTERVAL HISTORY: [name] returns today for follow-up and treatment of her biphenotypic cancer accompanied by her husband. She spent time with family in [place] for the last 2 months. She completed radiation treatments with Dr. Elspeth Hays at the [facility] at [facility] She received radiation for 30 doses with her last treatment on 03/19/2018. She notes that she has a sore and irritated area under the left breast. This area is slowly healing and she followed up with Dr. Melony Hays to prevent infections. She denies having significant fatigue.   She was previously on letrozole, and she experienced no issues with hot flashes or vaginal dryness. She was also receiving trastuzumab/ pertuzumab prior to going to [place].   Since her last visit, she completed a PET scan on 03/25/2018 showing: 12 mm short axis left IMA node, suspicious for nodal metastasis.  She had a visit with cardio oncology and an echo scheduled for 03/30/2018 but she canceled those.  REVIEW OF SYSTEMS: [name] reports that she stayed 2 months in [place] celebrating the birth of her new grandson, [name]. She is painted rooms in her house to prepare for her grandchildren to visit her. She denies unusual headaches, visual changes, nausea, vomiting, or dizziness. There has been no unusual cough, phlegm production, or pleurisy. This been no change in bowel or bladder habits. She denies unexplained fatigue or unexplained weight loss, bleeding, rash, or fever. A detailed review of systems was otherwise stable.    PAST MEDICAL HISTORY: Past Medical History:  Diagnosis Date  . Anxiety   . CAP (community acquired pneumonia)   .  Depression   . Malignant neoplasm of upper-inner quadrant of left female breast ([place]) 10/01/2016  .  Personal history of chemotherapy   . Tobacco use     PAST SURGICAL HISTORY: Past Surgical History:  Procedure Laterality Date  . ABDOMINAL HYSTERECTOMY  2011   Laparoscopic  . AUGMENTATION MAMMAPLASTY Bilateral 03/2017  . BREAST LUMPECTOMY Left 04/05/2017  . BREAST LUMPECTOMY WITH SENTINEL LYMPH NODE BIOPSY Left 04/07/2017  . BREAST REDUCTION SURGERY Left 04/07/2017  . CHOLECYSTECTOMY    . MASTECTOMY Bilateral 08/25/2017   at [facility]  . open reduction left ankle    . SKIN GRAFT Left 06/19/2017   bil skin grafts to breasts    FAMILY HISTORY Family History  Problem Relation Age of Onset  . Heart disease Mother   . Heart disease Father   . Cancer Sister        ovarian ca  the patient's father died at the age of 47 from a myocardial infarction. The patient's mother died at the age of 55 with congestive heart failure. The patient had one brother, 2 sisters. One of her sisters was diagnosed with ovarian cancer at age 38 and died at age 38 from that disease  GYNECOLOGIC HISTORY:  No LMP recorded. Patient has had a hysterectomy. Menarche age 5, first live birth age 18. The patient is GX P1. She stopped having periods approxiately 2008. She did not take hormone replacement. She used oral contraceptives for approxi 30 years with no complications.  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 Her husband [name] is retired from [employer]. Their daughter [name] lives in [place] and is Mudlogger of the [employer] life in [employer] and her husband works as an Sales executive at [employer]. The patient welcomed her 1st grandchild, a boy named [name] in May 2019.      ADVANCED DIRECTIVES: not in place   HEALTH MAINTENANCE: Social History   Tobacco Use  . Smoking status: Former Smoker    Packs/day: 0.50    Years: 20.00    Pack years: 10.00    Last attempt to quit: 10/21/2016    Years since quitting: 1.4  .  Smokeless tobacco: Never Used  Substance Use Topics  . Alcohol use: No  . Drug use: No     Colonoscopy:  PAP:  Bone density:   No Known Allergies  Current Outpatient Medications  Medication Sig Dispense Refill  . acetaminophen (TYLENOL) 325 MG tablet Take 650 mg by mouth every 6 (six) hours as needed for moderate pain.     Marland Kitchen ALPRAZolam (XANAX XR) 1 MG 24 hr tablet Take 1 tablet (1 mg total) by mouth daily. 30 tablet 0  . busPIRone (BUSPAR) 30 MG tablet Take 15 mg by mouth daily.     . diphenhydrAMINE (BENADRYL) 25 MG tablet Take 50 mg by mouth as needed for sleep.     Marland Kitchen ibuprofen (ADVIL,MOTRIN) 200 MG tablet Take 600 mg by mouth every 6 (six) hours as needed for moderate pain.     Marland Kitchen nystatin (MYCOSTATIN) 100000 UNIT/ML suspension Take 5 mLs (500,000 Units total) by mouth daily as needed (ulcers). 240 mL 6  . PARoxetine (PAXIL) 40 MG tablet Take 40 mg by mouth every morning.    . ranitidine (ZANTAC) 150 MG tablet Take 150 mg by mouth daily as needed for heartburn.     Marland Kitchen tiZANidine (ZANAFLEX) 4 MG tablet TAKE  1 TABLET BY MOUTH Bedtime    . valACYclovir (VALTREX) 1000 MG tablet Take 1 tablet (1,000 mg total) by mouth daily. 90 tablet 2   No current facility-administered medications for this visit.    Facility-Administered Medications Ordered in Other Visits  Medication Dose Route Frequency Provider Last Rate Last Dose  . heparin lock flush 100 unit/mL  500 Units Intracatheter Once PRN Nikoleta Dady, [name], MD         OBJECTIVE: Middle-aged white woman in no acute distress  Vitals:   03/27/18 1219  BP: (!) 159/82  Pulse: 93  Resp: 18  Temp: 98.6 F (37 C)  SpO2: 100%     Body mass index is 30.48 kg/m.   Filed Weights   03/27/18 1219  Weight: 206 lb 6.4 oz (93.6 kg)     ECOG FS:1 - Symptomatic but completely ambulatory   Sclerae unicteric, pupils round and equal Oropharynx clear and moist No cervical or supraclavicular adenopathy Lungs no rales or rhonchi Heart regular  rate and rhythm Abd soft, nontender, positive bowel sounds MSK no focal spinal tenderness, no upper extremity lymphedema Neuro: nonfocal, well oriented, appropriate affect Breasts: Status post bilateral mastectomy and status post left chest wall radiation.  There is an area in the medial aspect of the radiation port measuring 2 x 1 cm which is denuded but very shallow, with yellow base.  There is some desquamation as expected at this point from her radiation.  There is no evidence of recurrent disease.  Both axillae are benign  LAB RESULTS:  CMP     Component Value Date/Time   NA 141 01/09/2018 0920   NA 140 10/24/2017 1307   K 5.1 01/09/2018 0920   K 5.4 (H) 10/24/2017 1307   CL 108 01/09/2018 0920   CO2 26 01/09/2018 0920   CO2 29 10/24/2017 1307   GLUCOSE 109 01/09/2018 0920   GLUCOSE 103 10/24/2017 1307   BUN 13 01/09/2018 0920   BUN 18.2 10/24/2017 1307   CREATININE 0.82 01/09/2018 0920   CREATININE 0.8 10/24/2017 1307   CALCIUM 9.6 01/09/2018 0920   CALCIUM 10.3 10/24/2017 1307   PROT 6.9 01/09/2018 0920   PROT 7.5 10/24/2017 1307   ALBUMIN 4.0 01/09/2018 0920   ALBUMIN 4.0 10/24/2017 1307   AST 12 01/09/2018 0920   AST 9 10/24/2017 1307   ALT 11 01/09/2018 0920   ALT 8 10/24/2017 1307   ALKPHOS 129 01/09/2018 0920   ALKPHOS 115 10/24/2017 1307   BILITOT 0.3 01/09/2018 0920   BILITOT 0.28 10/24/2017 1307   GFRNONAA >60 01/09/2018 0920   GFRAA >60 01/09/2018 0920    INo results found for: SPEP, UPEP  Lab Results  Component Value Date   WBC 5.4 03/27/2018   NEUTROABS 4.1 03/27/2018   HGB 12.6 03/27/2018   HCT 38.8 03/27/2018   MCV 83.9 03/27/2018   PLT 244 03/27/2018      Chemistry      Component Value Date/Time   NA 141 01/09/2018 0920   NA 140 10/24/2017 1307   K 5.1 01/09/2018 0920   K 5.4 (H) 10/24/2017 1307   CL 108 01/09/2018 0920   CO2 26 01/09/2018 0920   CO2 29 10/24/2017 1307   BUN 13 01/09/2018 0920   BUN 18.2 10/24/2017 1307    CREATININE 0.82 01/09/2018 0920   CREATININE 0.8 10/24/2017 1307      Component Value Date/Time   CALCIUM 9.6 01/09/2018 0920   CALCIUM 10.3 10/24/2017 1307  ALKPHOS 129 01/09/2018 0920   ALKPHOS 115 10/24/2017 1307   AST 12 01/09/2018 0920   AST 9 10/24/2017 1307   ALT 11 01/09/2018 0920   ALT 8 10/24/2017 1307   BILITOT 0.3 01/09/2018 0920   BILITOT 0.28 10/24/2017 1307       No results found for: LABCA2  No components found for: LABCA125  No results for input(s): INR in the last 168 hours.  Urinalysis    Component Value Date/Time   COLORURINE STRAW (A) 10/30/2017 2050   APPEARANCEUR CLEAR 10/30/2017 2050   LABSPEC 1.006 10/30/2017 2050   [place] 6.0 10/30/2017 2050   GLUCOSEU NEGATIVE 10/30/2017 2050   [place] NEGATIVE 10/30/2017 2050   [place] NEGATIVE 10/30/2017 2050   [place] 10/30/2017 2050   PROTEINUR NEGATIVE 10/30/2017 2050   NITRITE NEGATIVE 10/30/2017 2050   LEUKOCYTESUR NEGATIVE 10/30/2017 2050     STUDIES: Nm Pet Image Restag (ps) Skull Base To Thigh  Result Date: 03/25/2018 CLINICAL DATA:  Subsequent treatment strategy for breast cancer. EXAM: NUCLEAR MEDICINE PET SKULL BASE TO THIGH TECHNIQUE: 10.4 mCi F-18 FDG was injected intravenously. Full-ring PET imaging was performed from the skull base to thigh after the radiotracer. CT data was obtained and used for attenuation correction and anatomic localization. Fasting blood glucose: 130 mg/dl COMPARISON:  CT chest dated 08/15/2017 FINDINGS: Mediastinal blood pool activity: SUV max 2.9 NECK: No hypermetabolic cervical lymphadenopathy. Incidental CT findings: none CHEST: 12 mm short axis left IMA node (series 4/image 69), new, max SUV 6.9, suspicious for nodal metastasis. Otherwise, no suspicious mediastinal, hilar, or axillary lymphadenopathy. Status post left axillary lymph node dissection. Status post left mastectomy. No suspicious pulmonary nodules. Incidental CT findings: Mild atherosclerotic  calcifications of the aortic arch. Right chest port terminates the cavoatrial junction. ABDOMEN/PELVIS: No hypermetabolic lymphadenopathy in the abdomen/pelvis. No abnormal hypermetabolism in the liver, spleen, pancreas, or adrenal glands. Incidental CT findings: Status post cholecystectomy. Atherosclerotic calcifications the abdominal aorta and branch vessels. Sigmoid diverticulosis, without evidence of diverticulitis. Trace pelvic ascites. SKELETON: No focal hypermetabolic activity to suggest skeletal metastasis. Incidental CT findings: none IMPRESSION: 12 mm short axis left IMA node, suspicious for nodal metastasis. Status post left mastectomy with left axillary lymph node dissection. Electronically Signed   By: Julian Hy M.D.   On: 03/25/2018 14:26     ELIGIBLE FOR AVAILABLE RESEARCH PROTOCOL: no  ASSESSMENT: 62 y.o. [employer], [place] woman status post left breast upper inner quadrant biopsy 09/30/2016 for a clinical T2 N0, stage II a invasive ductal carcinoma, grade 3, estrogen receptor positive, progesterone receptor negative, HER-2 not amplified, with an MIB-1 of 80%.  (1) genetics testing pending  (2) neoadjuvant chemotherapy given between 10/30/2016 and 03/12/2017 consisting of cyclophosphamide and doxorubicin in dose dense fashion 4  followed by paclitaxel 1, then Abraxane (x8?)  further treatments discontinued because of neuropathy.  (3) status post left lumpectomy with oncoplasty and right reduction mammoplasty showing a residual T2 N1 invasive ductal carcinoma, with lymphovascular invasion, but negative margins; the tumor is now HER-2 positive.  (a) postop PET scan showed no evidence of metastatic disease.  (4) adjuvant trastuzumab/pertuzumab started 06/20/2017 (received at [facility]), started here on 07/11/2017--continued through April 2019 when patient left for [place]  (a) Echo on 06/18/2017 at [facility]: LVEF 55-60%  (b) echocardiogram 09/26/2017 shows an ejection fraction in the  60-65% range.  (c) echocardiogram 12/26/2017 shows an ejection fraction in the 55-60% range  (5) adjuvant radiation was to follow, but see (7) below  (6)  anti-estrogens: Letrozole daily started 03/26/2017  (a) DEXA on 04/03/2017 demonstrated a T score of -1.3 in the right femoral neck  (b) Zometa given at [facility] on 06/20/2017  RECURRENCE: (7) Patient developed left inner breast nodule and underwent mammogram and ultrasound on 08/06/17 showing three areas of concern at 10 o'clock, 1130 o'clock, and at 8 o'clock. Biopsy on 08/07/2017 demonstrated at 11:30 fat necrosis, however at 10:00 it was positive for IDC, grade 3, ER+(50%), PR-(0%), Ki-67 90%, HER-2 negative (ratio 1.48).  (a) staging chest CT 08/15/2017 shows 0.5 cm left lower lobe lung nodule of uncertain significance  (8) status post left modified radical mastectomy and right simple mastectomy 08/25/2017 showing  (a) right breast, no evidence of malignancy  (b) left breast, ypT2 ypN1 invasive ductal carcinoma, grade 3, with negative margins; repeat prognostic panel: Triple negative  (9) started adjuvant chemotherapy with cyclophosphamide/methotrexate/fluorouracil [CMF] 11/10/2016, eight cycles planned  (a) continuing trastuzumab/Pertuzumab   (b) CMF chemotherapy discontinued after 4 cycles with disease progression  (10) status post left chest wall biopsy for further local recurrence 01/05/2018, the tumor being now weakly estrogen receptor positive, progesterone receptor negative, and again HER-2 negative  (11) PET scan 03/25/2018 negative except for a 1.2 cm left internal mammary lymph node, SUV 6.9  (12) adjuvant radiation completed 03/19/2018  (a) did not receive capecitabine sensitization  (13)  Letrozole resumed 03/27/2018  (14) foundation one 01/09/2018 shows no actionable mutations, with stable microsatellite status and low mutational burden.  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  (a) PD-L1 testing requested 02/02/2018--re-requested  03/27/2018  (15) trastuzumab and Pertuzumab resumed 03/31/2018  (a) echocardiogram pending   PLAN:  I spent approximately 45 minutes with [name] and her husband going over her situation.  We reviewed the PET scan which is generally favorable.  There is a 1.2 cm right internal mammary hot mass which is likely metastatic, but could be inflammatory, as she has had significant skin issues from the left chest wall radiation.  This will require follow-up.  There is no other area of measurable disease.  We reviewed the fact that her cancer has 3 "heads".  We need to treat the estrogen receptor dependent head and we are going to go back to letrozole.  We reviewed the possible side effects toxicities and complications of that agent including the possibility of osteoporosis  With regards to that she will resume zolendronate, which she received once at [facility], last year, with no side effects.  Nevertheless we reviewed the possibility of osteonecrosis of the jaw, bony pain, and transient hypocalcemia from that medication.  She will receive zolendronate every 6 months for at least the next 2 years  We then discussed the HER-2 positive portion of her tumor.  She is going to resume trastuzumab and Pertuzumab.  She has a good understanding of the possibility of cardiac damage and severe diarrhea from these agents as well as other possible toxicity side effects and complications.  Hopefully we will get that going again next week and continue indefinitely.  After 1 year we will switch to every 4 weeks.  Finally part of her tumor is triple negative.  The only thing that will work with that is chemotherapy.  I am reluctant to start chemotherapy in the absence of something to measure and we do not know what the right internal mammary masses.  Accordingly the plan is to not proceed to chemotherapy at this point but to repeat a PET scan in 3 months.  There is understand she needs  echocardiograms every 3 months while she  is receiving anti-HER-2 treatment.  This is being rescheduled.  She will see is again with her second dose of Herceptin/Perjeta just to make sure everything is in place and to clear any remaining questions  She knows to call for any other issues that may develop before the next visit.Jana Hakim, [name], MD  03/27/18 12:50 PM Medical Oncology and Hematology [facility] [address] [place], [place] [address] Tel. [phone]    Fax. [phone]  Alice Rieger, am acting as scribe for Chauncey Cruel MD.  I, Lurline Del MD, have reviewed the above documentation for accuracy and completeness, and I agree with the above.

## 2018-03-27 ENCOUNTER — Inpatient Hospital Stay: Payer: BLUE CROSS/BLUE SHIELD | Attending: Oncology | Admitting: Oncology

## 2018-03-27 ENCOUNTER — Telehealth: Payer: Self-pay | Admitting: Oncology

## 2018-03-27 ENCOUNTER — Inpatient Hospital Stay: Payer: BLUE CROSS/BLUE SHIELD

## 2018-03-27 VITALS — BP 159/82 | HR 93 | Temp 98.6°F | Resp 18 | Ht 69.0 in | Wt 206.4 lb

## 2018-03-27 DIAGNOSIS — M8580 Other specified disorders of bone density and structure, unspecified site: Secondary | ICD-10-CM

## 2018-03-27 DIAGNOSIS — T451X5A Adverse effect of antineoplastic and immunosuppressive drugs, initial encounter: Secondary | ICD-10-CM

## 2018-03-27 DIAGNOSIS — Z170 Estrogen receptor positive status [ER+]: Principal | ICD-10-CM

## 2018-03-27 DIAGNOSIS — C7951 Secondary malignant neoplasm of bone: Secondary | ICD-10-CM | POA: Diagnosis not present

## 2018-03-27 DIAGNOSIS — I700 Atherosclerosis of aorta: Secondary | ICD-10-CM

## 2018-03-27 DIAGNOSIS — C50212 Malignant neoplasm of upper-inner quadrant of left female breast: Secondary | ICD-10-CM | POA: Diagnosis present

## 2018-03-27 DIAGNOSIS — Z5112 Encounter for antineoplastic immunotherapy: Secondary | ICD-10-CM | POA: Insufficient documentation

## 2018-03-27 DIAGNOSIS — C50912 Malignant neoplasm of unspecified site of left female breast: Secondary | ICD-10-CM

## 2018-03-27 DIAGNOSIS — G620 Drug-induced polyneuropathy: Secondary | ICD-10-CM

## 2018-03-27 LAB — COMPREHENSIVE METABOLIC PANEL
ALBUMIN: 4.3 g/dL (ref 3.5–5.0)
ALK PHOS: 142 U/L (ref 40–150)
ALT: 11 U/L (ref 0–55)
AST: 13 U/L (ref 5–34)
Anion gap: 10 (ref 3–11)
BILIRUBIN TOTAL: 0.3 mg/dL (ref 0.2–1.2)
BUN: 15 mg/dL (ref 7–26)
CO2: 25 mmol/L (ref 22–29)
Calcium: 9.8 mg/dL (ref 8.4–10.4)
Chloride: 105 mmol/L (ref 98–109)
Creatinine, Ser: 0.84 mg/dL (ref 0.60–1.10)
GFR calc Af Amer: >60 mL/min (ref >60)
GFR calc non Af Amer: >60 mL/min (ref >60)
GLUCOSE: 102 mg/dL (ref 70–140)
POTASSIUM: 4.2 mmol/L (ref 3.5–5.1)
Sodium: 140 mmol/L (ref 136–145)
TOTAL PROTEIN: 7 g/dL (ref 6.4–8.3)

## 2018-03-27 LAB — CBC WITH DIFFERENTIAL/PLATELET
BASOS ABS: 0 10*3/uL (ref 0.0–0.1)
Basophils Relative: 1 %
EOS PCT: 4 %
Eosinophils Absolute: 0.2 10*3/uL (ref 0.0–0.5)
HCT: 38.8 % (ref 34.8–46.6)
Hemoglobin: 12.6 g/dL (ref 11.6–15.9)
LYMPHS PCT: 12 %
Lymphs Abs: 0.7 10*3/uL — ABNORMAL LOW (ref 0.9–3.3)
MCH: 27.3 pg (ref 25.1–34.0)
MCHC: 32.6 g/dL (ref 31.5–36.0)
MCV: 83.9 fL (ref 79.5–101.0)
MONO ABS: 0.4 10*3/uL (ref 0.1–0.9)
Monocytes Relative: 7 %
Neutro Abs: 4.1 10*3/uL (ref 1.5–6.5)
Neutrophils Relative %: 76 %
PLATELETS: 244 10*3/uL (ref 145–400)
RBC: 4.62 MIL/uL (ref 3.70–5.45)
RDW: 15.6 % — AB (ref 11.2–14.5)
WBC: 5.4 10*3/uL (ref 3.9–10.3)

## 2018-03-27 MED ORDER — LETROZOLE 2.5 MG PO TABS: 2.5000 mg | ORAL_TABLET | Freq: Every day | ORAL | 4 refills | Status: DC

## 2018-03-27 NOTE — Telephone Encounter (Signed)
Gave patient avs and calendar of upcoming appointments.  °

## 2018-03-30 ENCOUNTER — Encounter (HOSPITAL_COMMUNITY): Payer: BLUE CROSS/BLUE SHIELD | Admitting: Cardiology

## 2018-03-30 ENCOUNTER — Ambulatory Visit (HOSPITAL_COMMUNITY): Payer: BLUE CROSS/BLUE SHIELD

## 2018-04-03 ENCOUNTER — Inpatient Hospital Stay: Payer: BLUE CROSS/BLUE SHIELD

## 2018-04-03 VITALS — BP 125/76 | HR 71 | Temp 98.7°F | Resp 16 | Wt 206.0 lb

## 2018-04-03 DIAGNOSIS — C50212 Malignant neoplasm of upper-inner quadrant of left female breast: Secondary | ICD-10-CM

## 2018-04-03 DIAGNOSIS — Z5112 Encounter for antineoplastic immunotherapy: Secondary | ICD-10-CM | POA: Diagnosis not present

## 2018-04-03 DIAGNOSIS — Z170 Estrogen receptor positive status [ER+]: Principal | ICD-10-CM

## 2018-04-03 DIAGNOSIS — C50912 Malignant neoplasm of unspecified site of left female breast: Secondary | ICD-10-CM

## 2018-04-03 LAB — CBC WITH DIFFERENTIAL/PLATELET
BASOS ABS: 0.1 10*3/uL (ref 0.0–0.1)
Basophils Relative: 1 %
Eosinophils Absolute: 0.3 10*3/uL (ref 0.0–0.5)
Eosinophils Relative: 7 %
HEMATOCRIT: 37.2 % (ref 34.8–46.6)
Hemoglobin: 12.3 g/dL (ref 11.6–15.9)
LYMPHS PCT: 12 %
Lymphs Abs: 0.6 10*3/uL — ABNORMAL LOW (ref 0.9–3.3)
MCH: 27.6 pg (ref 25.1–34.0)
MCHC: 33 g/dL (ref 31.5–36.0)
MCV: 83.6 fL (ref 79.5–101.0)
MONO ABS: 0.4 10*3/uL (ref 0.1–0.9)
Monocytes Relative: 8 %
NEUTROS ABS: 3.6 10*3/uL (ref 1.5–6.5)
Neutrophils Relative %: 72 %
Platelets: 240 10*3/uL (ref 145–400)
RBC: 4.45 MIL/uL (ref 3.70–5.45)
RDW: 15.6 % — AB (ref 11.2–14.5)
WBC: 5 10*3/uL (ref 3.9–10.3)

## 2018-04-03 LAB — COMPREHENSIVE METABOLIC PANEL
ALBUMIN: 4 g/dL (ref 3.5–5.0)
ALT: 10 U/L (ref 0–55)
ANION GAP: 9 (ref 3–11)
AST: 11 U/L (ref 5–34)
Alkaline Phosphatase: 135 U/L (ref 40–150)
BUN: 16 mg/dL (ref 7–26)
CO2: 24 mmol/L (ref 22–29)
Calcium: 9.3 mg/dL (ref 8.4–10.4)
Chloride: 105 mmol/L (ref 98–109)
Creatinine, Ser: 0.78 mg/dL (ref 0.60–1.10)
GFR calc Af Amer: >60 mL/min (ref >60)
GFR calc non Af Amer: >60 mL/min (ref >60)
GLUCOSE: 102 mg/dL (ref 70–140)
POTASSIUM: 4.5 mmol/L (ref 3.5–5.1)
SODIUM: 138 mmol/L (ref 136–145)
TOTAL PROTEIN: 6.5 g/dL (ref 6.4–8.3)
Total Bilirubin: 0.3 mg/dL (ref 0.2–1.2)

## 2018-04-03 MED ORDER — ACETAMINOPHEN 325 MG PO TABS
650.0000 mg | ORAL_TABLET | Freq: Once | ORAL | Status: AC
  Administered 2018-04-03: 650 mg via ORAL

## 2018-04-03 MED ORDER — SODIUM CHLORIDE 0.9 % IV SOLN
Freq: Once | INTRAVENOUS | Status: AC
  Administered 2018-04-03: 08:00:00 via INTRAVENOUS

## 2018-04-03 MED ORDER — HEPARIN SOD (PORK) LOCK FLUSH 100 UNIT/ML IV SOLN
500.0000 [IU] | Freq: Once | INTRAVENOUS | Status: AC | PRN
  Administered 2018-04-03: 500 [IU]
  Filled 2018-04-03: qty 5

## 2018-04-03 MED ORDER — DIPHENHYDRAMINE HCL 25 MG PO CAPS
25.0000 mg | ORAL_CAPSULE | Freq: Once | ORAL | Status: AC
  Administered 2018-04-03: 25 mg via ORAL

## 2018-04-03 MED ORDER — TRASTUZUMAB CHEMO 150 MG IV SOLR
6.0000 mg/kg | Freq: Once | INTRAVENOUS | Status: AC
  Administered 2018-04-03: 567 mg via INTRAVENOUS
  Filled 2018-04-03: qty 27

## 2018-04-03 MED ORDER — PERTUZUMAB CHEMO INJECTION 420 MG/14ML
420.0000 mg | Freq: Once | INTRAVENOUS | Status: AC
  Administered 2018-04-03: 420 mg via INTRAVENOUS
  Filled 2018-04-03: qty 14

## 2018-04-03 MED ORDER — ZOLEDRONIC ACID 4 MG/100ML IV SOLN
4.0000 mg | Freq: Once | INTRAVENOUS | Status: AC
  Administered 2018-04-03: 4 mg via INTRAVENOUS
  Filled 2018-04-03: qty 100

## 2018-04-03 MED ORDER — SODIUM CHLORIDE 0.9% FLUSH
10.0000 mL | INTRAVENOUS | Status: DC | PRN
  Administered 2018-04-03: 10 mL
  Filled 2018-04-03: qty 10

## 2018-04-03 MED ORDER — ACETAMINOPHEN 325 MG PO TABS
ORAL_TABLET | ORAL | Status: AC
  Filled 2018-04-03: qty 2

## 2018-04-03 MED ORDER — DIPHENHYDRAMINE HCL 25 MG PO CAPS
ORAL_CAPSULE | ORAL | Status: AC
  Filled 2018-04-03: qty 1

## 2018-04-03 NOTE — Patient Instructions (Signed)
[place] Discharge Instructions for Patients Receiving Chemotherapy  Today you received the following chemotherapy agents Herceptin, Perjeta, Zometa.  To help prevent nausea and vomiting after your treatment, we encourage you to take your nausea medication as directed.  If you develop nausea and vomiting that is not controlled by your nausea medication, call the clinic.   BELOW ARE SYMPTOMS THAT SHOULD BE REPORTED IMMEDIATELY:  *FEVER GREATER THAN 100.5 F  *CHILLS WITH OR WITHOUT FEVER  NAUSEA AND VOMITING THAT IS NOT CONTROLLED WITH YOUR NAUSEA MEDICATION  *UNUSUAL SHORTNESS OF BREATH  *UNUSUAL BRUISING OR BLEEDING  TENDERNESS IN MOUTH AND THROAT WITH OR WITHOUT PRESENCE OF ULCERS  *URINARY PROBLEMS  *BOWEL PROBLEMS  UNUSUAL RASH Items with * indicate a potential emergency and should be followed up as soon as possible.  Feel free to call the clinic should you have any questions or concerns. The clinic phone number is [phone].  Please show the [place] at check-in to the Emergency Department and triage nurse.  Zoledronic Acid injection (Hypercalcemia, Oncology) What is this medicine? ZOLEDRONIC ACID (ZOE le dron ik AS id) lowers the amount of calcium loss from bone. It is used to treat too much calcium in your blood from cancer. It is also used to prevent complications of cancer that has spread to the bone. This medicine may be used for other purposes; ask your health care provider or pharmacist if you have questions. COMMON BRAND NAME(S): Zometa What should I tell my health care provider before I take this medicine? They need to know if you have any of these conditions: -aspirin-sensitive asthma -cancer, especially if you are receiving medicines used to treat cancer -dental disease or wear dentures -infection -kidney disease -receiving corticosteroids like dexamethasone or prednisone -an unusual or allergic reaction to zoledronic acid,  other medicines, foods, dyes, or preservatives -pregnant or trying to get pregnant -breast-feeding How should I use this medicine? This medicine is for infusion into a vein. It is given by a health care professional in a hospital or clinic setting. Talk to your pediatrician regarding the use of this medicine in children. Special care may be needed. Overdosage: If you think you have taken too much of this medicine contact a poison control center or emergency room at once. NOTE: This medicine is only for you. Do not share this medicine with others. What if I miss a dose? It is important not to miss your dose. Call your doctor or health care professional if you are unable to keep an appointment. What may interact with this medicine? -certain antibiotics given by injection -NSAIDs, medicines for pain and inflammation, like ibuprofen or naproxen -some diuretics like bumetanide, furosemide -teriparatide -thalidomide This list may not describe all possible interactions. Give your health care provider a list of all the medicines, herbs, non-prescription drugs, or dietary supplements you use. Also tell them if you smoke, drink alcohol, or use illegal drugs. Some items may interact with your medicine. What should I watch for while using this medicine? Visit your doctor or health care professional for regular checkups. It may be some time before you see the benefit from this medicine. Do not stop taking your medicine unless your doctor tells you to. Your doctor may order blood tests or other tests to see how you are doing. Women should inform their doctor if they wish to become pregnant or think they might be pregnant. There is a potential for serious side effects to an unborn child. Talk  to your health care professional or pharmacist for more information. You should make sure that you get enough calcium and vitamin D while you are taking this medicine. Discuss the foods you eat and the vitamins you take  with your health care professional. Some people who take this medicine have severe bone, joint, and/or muscle pain. This medicine may also increase your risk for jaw problems or a broken thigh bone. Tell your doctor right away if you have severe pain in your jaw, bones, joints, or muscles. Tell your doctor if you have any pain that does not go away or that gets worse. Tell your dentist and dental surgeon that you are taking this medicine. You should not have major dental surgery while on this medicine. See your dentist to have a dental exam and fix any dental problems before starting this medicine. Take good care of your teeth while on this medicine. Make sure you see your dentist for regular follow-up appointments. What side effects may I notice from receiving this medicine? Side effects that you should report to your doctor or health care professional as soon as possible: -allergic reactions like skin rash, itching or hives, swelling of the face, lips, or tongue -anxiety, confusion, or depression -breathing problems -changes in vision -eye pain -feeling faint or lightheaded, falls -jaw pain, especially after dental work -mouth sores -muscle cramps, stiffness, or weakness -redness, blistering, peeling or loosening of the skin, including inside the mouth -trouble passing urine or change in the amount of urine Side effects that usually do not require medical attention (report to your doctor or health care professional if they continue or are bothersome): -bone, joint, or muscle pain -constipation -diarrhea -fever -hair loss -irritation at site where injected -loss of appetite -nausea, vomiting -stomach upset -trouble sleeping -trouble swallowing -weak or tired This list may not describe all possible side effects. Call your doctor for medical advice about side effects. You may report side effects to FDA at 1-800-FDA-1088. Where should I keep my medicine? This drug is given in a hospital  or clinic and will not be stored at home. NOTE: This sheet is a summary. It may not cover all possible information. If you have questions about this medicine, talk to your doctor, pharmacist, or health care provider.  2018 [employer] (2014-03-05 14:19:39)

## 2018-04-07 ENCOUNTER — Other Ambulatory Visit: Payer: Self-pay | Admitting: Oncology

## 2018-04-07 NOTE — Progress Notes (Unsigned)
[place]  [phone] [phone]     ID: [name] DOB: Jan 23, 1956  MR#: [id number]  JOA#:[id number]  Patient Care Team: Chesley Noon, MD as PCP - General (Family Medicine) Excell Seltzer, MD as Consulting Physician (General Surgery) Khye Hochstetler, [name], MD as Consulting Physician (Oncology) Gery Pray, MD as Consulting Physician (Radiation Oncology) Collene Gobble, MD as Consulting Physician (Pulmonary Disease) Verdell Carmine, MD as Referring Physician (Internal Medicine) Larey Dresser, MD as Consulting Physician (Cardiology) Beverely Pace, MD as Referring Physician (Surgical Oncology) OTHER MD: Dr Lyda Jester [phone])  CHIEF COMPLAINT: estrogen receptor positive, HER-2 positive breast cancer  CURRENT TREATMENT: Adjuvant Trastuzumab/ Pertuzumab, letrozole, zolendronate   BREAST CANCER HISTORY: From the original intake note:  "[name]" had a fall while traveling living to [place] in their RV and injure her left upper arm and  her left breast . Approximately 3 weeks later she noted a mass in the left breast upper inner quadrant. It was somewhat tender. She brought this to medical attention and on 09/27/2016 underwent bilateral diagnostic mammography with tomography [facility] The breast density was category B. The right breast was benign. In the left breast upper inner quadrant there was a large lobulated hyperdense mass which by exam was firm and nonmobile. By ultrasonography in the 11:30 o'clock radius 4 cm from the nipple there was a mass extending to the subcutaneous tissues and measuring 4.9 cm. There was a small superficial fluid collection overlying this consistent with a hematoma. The left axilla was benign.  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  The patient's subsequent history is as detailed below  INTERVAL HISTORY: Salvatore returns today for follow-up and treatment of her biphenotypic cancer accompanied by her husband. She spent time with family in [place] for the last 2 months. She completed radiation treatments with Dr. Elspeth Cho at the [facility]. She received radiation for 30 doses with her last treatment on 03/19/2018. She notes that she has a sore and irritated area under the left breast. This area is slowly healing and she followed up with Dr. Melony Overly to prevent infections. She denies having significant fatigue.   She was previously on letrozole, and she experienced no issues with hot flashes or vaginal dryness. She was also receiving trastuzumab/ pertuzumab prior to going to [place].   Since her last visit, she completed a PET scan on 03/25/2018 showing: 12 mm short axis left IMA node, suspicious for nodal metastasis.  She had a visit with cardio oncology and an echo scheduled for 03/30/2018 but she canceled those.  REVIEW OF SYSTEMS: [name] reports that she stayed 2 months in [place] celebrating the birth of her new grandson, Hausner. She is painted rooms in her house to prepare for her grandchildren to visit her. She denies unusual headaches, visual changes, nausea, vomiting, or dizziness. There has been no unusual cough, phlegm production, or pleurisy. This been no change in bowel or bladder habits. She denies unexplained fatigue or unexplained weight loss, bleeding, rash, or fever. A detailed review of systems was otherwise stable.    PAST MEDICAL HISTORY: Past Medical History:  Diagnosis Date  . Anxiety   . CAP (community acquired pneumonia)   .  Depression   . Malignant neoplasm of upper-inner quadrant of left female breast ([place]) 10/01/2016  .  Personal history of chemotherapy   . Tobacco use     PAST SURGICAL HISTORY: Past Surgical History:  Procedure Laterality Date  . ABDOMINAL HYSTERECTOMY  2011   Laparoscopic  . AUGMENTATION MAMMAPLASTY Bilateral 03/2017  . BREAST LUMPECTOMY Left 04/05/2017  . BREAST LUMPECTOMY WITH SENTINEL LYMPH NODE BIOPSY Left 04/07/2017  . BREAST REDUCTION SURGERY Left 04/07/2017  . CHOLECYSTECTOMY    . MASTECTOMY Bilateral 08/25/2017   at [facility]  . open reduction left ankle    . SKIN GRAFT Left 06/19/2017   bil skin grafts to breasts    FAMILY HISTORY Family History  Problem Relation Age of Onset  . Heart disease Mother   . Heart disease Father   . Cancer Sister        ovarian ca  the patient's father died at the age of 79 from a myocardial infarction. The patient's mother died at the age of 63 with congestive heart failure. The patient had one brother, 2 sisters. One of her sisters was diagnosed with ovarian cancer at age 78 and died at age 50 from that disease  GYNECOLOGIC HISTORY:  No LMP recorded. Patient has had a hysterectomy. Menarche age 62, first live birth age 62. The patient is GX P1. She stopped having periods approxiately 2008. She did not take hormone replacement. She used oral contraceptives for approxi 30 years with no complications.  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 Her husband [name] is retired from [employer]. Their daughter Rhaya Coale lives in [name] and is Mudlogger of the [employer] life in [employer] and her husband works as an Sales executive at [employer]. The patient welcomed her 1st grandchild, a boy named [name] in May 2019.      ADVANCED DIRECTIVES: not in place   HEALTH MAINTENANCE: Social History   Tobacco Use  . Smoking status: Former Smoker    Packs/day: 0.50    Years: 20.00    Pack years: 10.00    Last attempt to quit: 10/21/2016    Years since quitting: 1.4  .  Smokeless tobacco: Never Used  Substance Use Topics  . Alcohol use: No  . Drug use: No     Colonoscopy:  PAP:  Bone density:   No Known Allergies  Current Outpatient Medications  Medication Sig Dispense Refill  . acetaminophen (TYLENOL) 325 MG tablet Take 650 mg by mouth every 6 (six) hours as needed for moderate pain.     Marland Kitchen ALPRAZolam (XANAX XR) 1 MG 24 hr tablet Take 1 tablet (1 mg total) by mouth daily. 30 tablet 0  . busPIRone (BUSPAR) 30 MG tablet Take 15 mg by mouth daily.     . diphenhydrAMINE (BENADRYL) 25 MG tablet Take 50 mg by mouth as needed for sleep.     Marland Kitchen ibuprofen (ADVIL,MOTRIN) 200 MG tablet Take 600 mg by mouth every 6 (six) hours as needed for moderate pain.     Marland Kitchen letrozole (FEMARA) 2.5 MG tablet Take 1 tablet (2.5 mg total) by mouth daily. 90 tablet 4  . nystatin (MYCOSTATIN) 100000 UNIT/ML suspension Take 5 mLs (500,000 Units total) by mouth daily as needed (ulcers). 240 mL 6  . PARoxetine (PAXIL) 40 MG tablet Take 40 mg by mouth every morning.    . ranitidine (ZANTAC) 150 MG tablet Take 150  mg by mouth daily as needed for heartburn.     Marland Kitchen tiZANidine (ZANAFLEX) 4 MG tablet TAKE 1 TABLET BY MOUTH Bedtime    . valACYclovir (VALTREX) 1000 MG tablet Take 1 tablet (1,000 mg total) by mouth daily. 90 tablet 2   No current facility-administered medications for this visit.    Facility-Administered Medications Ordered in Other Visits  Medication Dose Route Frequency Provider Last Rate Last Dose  . heparin lock flush 100 unit/mL  500 Units Intracatheter Once PRN Ward Boissonneault, [name], MD         OBJECTIVE: Middle-aged white woman in no acute distress  There were no vitals filed for this visit.   There is no height or weight on file to calculate BMI.   There were no vitals filed for this visit.   ECOG FS:1 - Symptomatic but completely ambulatory   Sclerae unicteric, pupils round and equal Oropharynx clear and moist No cervical or supraclavicular adenopathy Lungs no  rales or rhonchi Heart regular rate and rhythm Abd soft, nontender, positive bowel sounds MSK no focal spinal tenderness, no upper extremity lymphedema Neuro: nonfocal, well oriented, appropriate affect Breasts: Status post bilateral mastectomy and status post left chest wall radiation.  There is an area in the medial aspect of the radiation port measuring 2 x 1 cm which is denuded but very shallow, with yellow base.  There is some desquamation as expected at this point from her radiation.  There is no evidence of recurrent disease.  Both axillae are benign  LAB RESULTS:  CMP     Component Value Date/Time   NA 138 04/03/2018 0804   NA 140 10/24/2017 1307   K 4.5 04/03/2018 0804   K 5.4 (H) 10/24/2017 1307   CL 105 04/03/2018 0804   CO2 24 04/03/2018 0804   CO2 29 10/24/2017 1307   GLUCOSE 102 04/03/2018 0804   GLUCOSE 103 10/24/2017 1307   BUN 16 04/03/2018 0804   BUN 18.2 10/24/2017 1307   CREATININE 0.78 04/03/2018 0804   CREATININE 0.8 10/24/2017 1307   CALCIUM 9.3 04/03/2018 0804   CALCIUM 10.3 10/24/2017 1307   PROT 6.5 04/03/2018 0804   PROT 7.5 10/24/2017 1307   ALBUMIN 4.0 04/03/2018 0804   ALBUMIN 4.0 10/24/2017 1307   AST 11 04/03/2018 0804   AST 9 10/24/2017 1307   ALT 10 04/03/2018 0804   ALT 8 10/24/2017 1307   ALKPHOS 135 04/03/2018 0804   ALKPHOS 115 10/24/2017 1307   BILITOT 0.3 04/03/2018 0804   BILITOT 0.28 10/24/2017 1307   GFRNONAA >60 04/03/2018 0804   GFRAA >60 04/03/2018 0804    INo results found for: SPEP, UPEP  Lab Results  Component Value Date   WBC 5.0 04/03/2018   NEUTROABS 3.6 04/03/2018   HGB 12.3 04/03/2018   HCT 37.2 04/03/2018   MCV 83.6 04/03/2018   PLT 240 04/03/2018      Chemistry      Component Value Date/Time   NA 138 04/03/2018 0804   NA 140 10/24/2017 1307   K 4.5 04/03/2018 0804   K 5.4 (H) 10/24/2017 1307   CL 105 04/03/2018 0804   CO2 24 04/03/2018 0804   CO2 29 10/24/2017 1307   BUN 16 04/03/2018 0804   BUN  18.2 10/24/2017 1307   CREATININE 0.78 04/03/2018 0804   CREATININE 0.8 10/24/2017 1307      Component Value Date/Time   CALCIUM 9.3 04/03/2018 0804   CALCIUM 10.3 10/24/2017 1307   ALKPHOS 135  04/03/2018 0804   ALKPHOS 115 10/24/2017 1307   AST 11 04/03/2018 0804   AST 9 10/24/2017 1307   ALT 10 04/03/2018 0804   ALT 8 10/24/2017 1307   BILITOT 0.3 04/03/2018 0804   BILITOT 0.28 10/24/2017 1307       No results found for: LABCA2  No components found for: LABCA125  No results for input(s): INR in the last 168 hours.  Urinalysis    Component Value Date/Time   COLORURINE STRAW (A) 10/30/2017 2050   APPEARANCEUR CLEAR 10/30/2017 2050   LABSPEC 1.006 10/30/2017 2050   [place] 6.0 10/30/2017 2050   GLUCOSEU NEGATIVE 10/30/2017 2050   [place] NEGATIVE 10/30/2017 2050   [place] NEGATIVE 10/30/2017 2050   [place] 10/30/2017 2050   PROTEINUR NEGATIVE 10/30/2017 2050   NITRITE NEGATIVE 10/30/2017 2050   LEUKOCYTESUR NEGATIVE 10/30/2017 2050     STUDIES: Nm Pet Image Restag (ps) Skull Base To Thigh  Result Date: 03/25/2018 CLINICAL DATA:  Subsequent treatment strategy for breast cancer. EXAM: NUCLEAR MEDICINE PET SKULL BASE TO THIGH TECHNIQUE: 10.4 mCi F-18 FDG was injected intravenously. Full-ring PET imaging was performed from the skull base to thigh after the radiotracer. CT data was obtained and used for attenuation correction and anatomic localization. Fasting blood glucose: 130 mg/dl COMPARISON:  CT chest dated 08/15/2017 FINDINGS: Mediastinal blood pool activity: SUV max 2.9 NECK: No hypermetabolic cervical lymphadenopathy. Incidental CT findings: none CHEST: 12 mm short axis left IMA node (series 4/image 69), new, max SUV 6.9, suspicious for nodal metastasis. Otherwise, no suspicious mediastinal, hilar, or axillary lymphadenopathy. Status post left axillary lymph node dissection. Status post left mastectomy. No suspicious pulmonary nodules. Incidental CT  findings: Mild atherosclerotic calcifications of the aortic arch. Right chest port terminates the cavoatrial junction. ABDOMEN/PELVIS: No hypermetabolic lymphadenopathy in the abdomen/pelvis. No abnormal hypermetabolism in the liver, spleen, pancreas, or adrenal glands. Incidental CT findings: Status post cholecystectomy. Atherosclerotic calcifications the abdominal aorta and branch vessels. Sigmoid diverticulosis, without evidence of diverticulitis. Trace pelvic ascites. SKELETON: No focal hypermetabolic activity to suggest skeletal metastasis. Incidental CT findings: none IMPRESSION: 12 mm short axis left IMA node, suspicious for nodal metastasis. Status post left mastectomy with left axillary lymph node dissection. Electronically Signed   By: Julian Hy M.D.   On: 03/25/2018 14:26     ELIGIBLE FOR AVAILABLE RESEARCH PROTOCOL: no  ASSESSMENT: 62 y.o. [employer], [place] woman status post left breast upper inner quadrant biopsy 09/30/2016 for a clinical T2 N0, stage II a invasive ductal carcinoma, grade 3, estrogen receptor positive, progesterone receptor negative, HER-2 not amplified, with an MIB-1 of 80%.  (1) genetics testing pending  (2) neoadjuvant chemotherapy given between 10/30/2016 and 03/12/2017 consisting of cyclophosphamide and doxorubicin in dose dense fashion 4  followed by paclitaxel 1, then Abraxane (x8?)  further treatments discontinued because of neuropathy.  (3) status post left lumpectomy with oncoplasty and right reduction mammoplasty showing a residual T2 N1 invasive ductal carcinoma, with lymphovascular invasion, but negative margins; the tumor is now HER-2 positive.  (a) postop PET scan showed no evidence of metastatic disease.  (4) adjuvant trastuzumab/pertuzumab started 06/20/2017 (received at [facility]), started here on 07/11/2017--continued through April 2019 when patient left for [place]  (a) Echo on 06/18/2017 at [facility]: LVEF 55-60%  (b) echocardiogram 09/26/2017 shows  an ejection fraction in the 60-65% range.  (c) echocardiogram 12/26/2017 shows an ejection fraction in the 55-60% range  (5) adjuvant radiation was to follow, but see (7) below  (6) anti-estrogens: Letrozole  daily started 03/26/2017  (a) DEXA on 04/03/2017 demonstrated a T score of -1.3 in the right femoral neck  (b) Zometa given at [facility] on 06/20/2017  RECURRENCE: (7) Patient developed left inner breast nodule and underwent mammogram and ultrasound on 08/06/17 showing three areas of concern at 10 o'clock, 1130 o'clock, and at 8 o'clock. Biopsy on 08/07/2017 demonstrated at 11:30 fat necrosis, however at 10:00 it was positive for IDC, grade 3, ER+(50%), PR-(0%), Ki-67 90%, HER-2 negative (ratio 1.48).  (a) staging chest CT 08/15/2017 shows 0.5 cm left lower lobe lung nodule of uncertain significance  (8) status post left modified radical mastectomy and right simple mastectomy 08/25/2017 showing  (a) right breast, no evidence of malignancy  (b) left breast, ypT2 ypN1 invasive ductal carcinoma, grade 3, with negative margins; repeat prognostic panel: Triple negative  (9) started adjuvant chemotherapy with cyclophosphamide/methotrexate/fluorouracil [CMF] 11/10/2016, eight cycles planned  (a) continuing trastuzumab/Pertuzumab   (b) CMF chemotherapy discontinued after 4 cycles with disease progression  (10) status post left chest wall biopsy for further local recurrence 01/05/2018, the tumor being now weakly estrogen receptor positive, progesterone receptor negative, and again HER-2 negative  (11) PET scan 03/25/2018 negative except for a 1.2 cm left internal mammary lymph node, SUV 6.9  (12) adjuvant radiation completed 03/19/2018  (a) did not receive capecitabine sensitization  (13)  Letrozole resumed 03/27/2018  (14) foundation one 01/09/2018 shows no actionable mutations, with stable microsatellite status and low mutational burden.  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  (a) PD-L1 testing requested  02/02/2018--re-requested 03/27/2018  (15) trastuzumab and Pertuzumab resumed 03/31/2018  (a) echocardiogram pending   PLAN:  I spent approximately 45 minutes with Helene Kelp and her husband going over her situation.  We reviewed the PET scan which is generally favorable.  There is a 1.2 cm right internal mammary hot mass which is likely metastatic, but could be inflammatory, as she has had significant skin issues from the left chest wall radiation.  This will require follow-up.  There is no other area of measurable disease.  We reviewed the fact that her cancer has 3 "heads".  We need to treat the estrogen receptor dependent head and we are going to go back to letrozole.  We reviewed the possible side effects toxicities and complications of that agent including the possibility of osteoporosis  With regards to that she will resume zolendronate, which she received once at [facility], last year, with no side effects.  Nevertheless we reviewed the possibility of osteonecrosis of the jaw, bony pain, and transient hypocalcemia from that medication.  She will receive zolendronate every 6 months for at least the next 2 years  We then discussed the HER-2 positive portion of her tumor.  She is going to resume trastuzumab and Pertuzumab.  She has a good understanding of the possibility of cardiac damage and severe diarrhea from these agents as well as other possible toxicity side effects and complications.  Hopefully we will get that going again next week and continue indefinitely.  After 1 year we will switch to every 4 weeks.  Finally part of her tumor is triple negative.  The only thing that will work with that is chemotherapy.  I am reluctant to start chemotherapy in the absence of something to measure and we do not know what the right internal mammary masses.  Accordingly the plan is to not proceed to chemotherapy at this point but to repeat a PET scan in 3 months.  There is understand she needs echocardiograms  every 3 months while she is receiving anti-HER-2 treatment.  This is being rescheduled.  She will see is again with her second dose of Herceptin/Perjeta just to make sure everything is in place and to clear any remaining questions  She knows to call for any other issues that may develop before the next visit.Jana Hakim, [name], MD  04/07/18 1:31 PM Medical Oncology and Hematology [facility] [address] [place], [place] [address] Tel. [phone]    Fax. [phone]  Alice Rieger, am acting as scribe for Chauncey Cruel MD.  I, Lurline Del MD, have reviewed the above documentation for accuracy and completeness, and I agree with the above.

## 2018-04-08 ENCOUNTER — Other Ambulatory Visit: Payer: Self-pay | Admitting: *Deleted

## 2018-04-08 DIAGNOSIS — C50212 Malignant neoplasm of upper-inner quadrant of left female breast: Secondary | ICD-10-CM

## 2018-04-08 DIAGNOSIS — Z170 Estrogen receptor positive status [ER+]: Principal | ICD-10-CM

## 2018-04-08 MED ORDER — ALPRAZOLAM ER 1 MG PO TB24
1.0000 mg | ORAL_TABLET | Freq: Every day | ORAL | 1 refills | Status: DC
Start: 2018-04-08 — End: 2018-06-09

## 2018-04-09 ENCOUNTER — Other Ambulatory Visit: Payer: Self-pay | Admitting: *Deleted

## 2018-04-10 NOTE — Telephone Encounter (Signed)
Refill called to pharmacy for Xanax XR

## 2018-04-15 ENCOUNTER — Other Ambulatory Visit: Payer: Self-pay | Admitting: *Deleted

## 2018-04-15 ENCOUNTER — Telehealth: Payer: Self-pay | Admitting: *Deleted

## 2018-04-15 NOTE — Telephone Encounter (Addendum)
This RN received VM from pt stating she is scheduled for herceptin next week - and is awaiting an appointment for her ECHO. ECHO on 6/10 with Dr Aundra Dubin was cancelled due to [name] having continued skin irritation from Radiation which would interfere with reading.  [name] states her skin has healed and would like to proceed with getting the ECHO - ideally before her scheduled treatment on July 3rd.  This RN contacted Dr Claris Gladden and obtained an appointment for Monday - July 1 at 2pm at [facility]  This RN contacted pt with verbalized understanding of appointment time,date and location.

## 2018-04-20 ENCOUNTER — Ambulatory Visit (HOSPITAL_COMMUNITY)
Admission: RE | Admit: 2018-04-20 | Discharge: 2018-04-20 | Disposition: A | Discharge status: Home / Self Care | Payer: BLUE CROSS/BLUE SHIELD | Source: Ambulatory Visit | Attending: Cardiology | Admitting: Cardiology

## 2018-04-20 DIAGNOSIS — I361 Nonrheumatic tricuspid (valve) insufficiency: Secondary | ICD-10-CM | POA: Diagnosis not present

## 2018-04-20 DIAGNOSIS — T451X5A Adverse effect of antineoplastic and immunosuppressive drugs, initial encounter: Secondary | ICD-10-CM | POA: Diagnosis not present

## 2018-04-20 DIAGNOSIS — C50912 Malignant neoplasm of unspecified site of left female breast: Secondary | ICD-10-CM

## 2018-04-20 DIAGNOSIS — Z87891 Personal history of nicotine dependence: Secondary | ICD-10-CM | POA: Diagnosis not present

## 2018-04-20 DIAGNOSIS — Z170 Estrogen receptor positive status [ER+]: Secondary | ICD-10-CM | POA: Insufficient documentation

## 2018-04-20 DIAGNOSIS — G620 Drug-induced polyneuropathy: Secondary | ICD-10-CM | POA: Diagnosis not present

## 2018-04-20 DIAGNOSIS — I071 Rheumatic tricuspid insufficiency: Secondary | ICD-10-CM | POA: Insufficient documentation

## 2018-04-20 DIAGNOSIS — C50212 Malignant neoplasm of upper-inner quadrant of left female breast: Secondary | ICD-10-CM | POA: Diagnosis not present

## 2018-04-20 DIAGNOSIS — I700 Atherosclerosis of aorta: Secondary | ICD-10-CM | POA: Diagnosis not present

## 2018-04-20 DIAGNOSIS — I313 Pericardial effusion (noninflammatory): Secondary | ICD-10-CM | POA: Insufficient documentation

## 2018-04-20 NOTE — Progress Notes (Signed)
  Echocardiogram 2D Echocardiogram has been performed.  [name] 04/20/2018, 3:12 PM

## 2018-04-21 NOTE — Progress Notes (Signed)
[place]  [phone]) [phone] Fax:(336) [phone]     ID: [name] DOB: 1956-08-31  MR#: [id number]  JOA#:[id number]  Patient Care Team: Chesley Noon, MD as PCP - General (Family Medicine) Excell Seltzer, MD as Consulting Physician (General Surgery) Suhaas Agena, [name], MD as Consulting Physician (Oncology) Gery Pray, MD as Consulting Physician (Radiation Oncology) Collene Gobble, MD as Consulting Physician (Pulmonary Disease) Verdell Carmine, MD as Referring Physician (Internal Medicine) Larey Dresser, MD as Consulting Physician (Cardiology) Beverely Pace, MD as Referring Physician (Surgical Oncology) OTHER MD: Dr Lyda Jester [phone])  CHIEF COMPLAINT: estrogen receptor positive, HER-2 positive breast cancer  CURRENT TREATMENT: Adjuvant Trastuzumab/ Pertuzumab, letrozole, zolendronate   BREAST CANCER HISTORY: From the original intake note:  "[name]" had a fall while traveling living to [place] in their RV and injure her left upper arm and  her left breast . Approximately 3 weeks later she noted a mass in the left breast upper inner quadrant. It was somewhat tender. She brought this to medical attention and on 09/27/2016 underwent bilateral diagnostic mammography with tomography [facility] The breast density was category B. The right breast was benign. In the left breast upper inner quadrant there was a large lobulated hyperdense mass which by exam was firm and nonmobile. By ultrasonography in the 11:30 o'clock radius 4 cm from the nipple there was a mass extending to the subcutaneous tissues and measuring 4.9 cm. There was a small superficial fluid collection overlying this consistent with a hematoma. The left axilla was benign.  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  The patient's subsequent history is as detailed below  INTERVAL HISTORY: [name] returns today for follow-up and treatment of her biphenotypic cancer. She also continues on letrozole, with good tolerance. She denies issues with hot flashes or vaginal dryness.   She receives trastuzumab/ pertuzumab every 21 days, with a dose due today. She tolerates this well. She experiences some diarrhea, and she aids this with either Imodium or Questran.  Her most recent echocardiogram on 04/20/2018 showed an ejection fraction in the 50-55% range. Her port is working well, with no access issues.   She notes that uses silvadene and  meta honey in the left chest wall area that received radiation. This area is still healing. She followed up with Dr. Melony Overly.   She receives zolendronate every 12 weeks, with her most recent dose on 04/03/2018. She tolerates this well. She did not have any teeth extractions, and she follows her dentist regularly.   REVIEW OF SYSTEMS: [name] reports that her husband is planting Secretary/administrator. She has been staying busy and painting ad gardening. She is going to [facility] on 05/10/2018 to help her children move back to [place]. She has been anxious in the mornings. She denies unusual headaches, visual changes, nausea, vomiting, or dizziness. There has been no unusual cough, phlegm production, or pleurisy. This been no change in bowel or bladder habits. She denies unexplained fatigue or unexplained weight loss, bleeding, rash, or fever. A detailed review of systems was otherwise stable.    PAST MEDICAL HISTORY: Past Medical History:  Diagnosis Date  . Anxiety   . CAP (community acquired pneumonia)   . Depression   .  Malignant neoplasm of upper-inner quadrant of left female breast ([place]) 10/01/2016  . Personal history of chemotherapy   .  Tobacco use     PAST SURGICAL HISTORY: Past Surgical History:  Procedure Laterality Date  . ABDOMINAL HYSTERECTOMY  2011   Laparoscopic  . AUGMENTATION MAMMAPLASTY Bilateral 03/2017  . BREAST LUMPECTOMY Left 04/05/2017  . BREAST LUMPECTOMY WITH SENTINEL LYMPH NODE BIOPSY Left 04/07/2017  . BREAST REDUCTION SURGERY Left 04/07/2017  . CHOLECYSTECTOMY    . MASTECTOMY Bilateral 08/25/2017   at [facility]  . open reduction left ankle    . SKIN GRAFT Left 06/19/2017   bil skin grafts to breasts    FAMILY HISTORY Family History  Problem Relation Age of Onset  . Heart disease Mother   . Heart disease Father   . Cancer Sister        ovarian ca  the patient's father died at the age of 56 from a myocardial infarction. The patient's mother died at the age of 55 with congestive heart failure. The patient had one brother, 2 sisters. One of her sisters was diagnosed with ovarian cancer at age 55 and died at age 28 from that disease  GYNECOLOGIC HISTORY:  No LMP recorded. Patient has had a hysterectomy. Menarche age 38, first live birth age 20. The patient is GX P1. She stopped having periods approximately 2008. She did not take hormone replacement. She used oral contraceptives for approximately 30 years with no complications.  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 Her husband [name] is retired from [employer]. Their daughter [name] lives in [place] and is Mudlogger of the [employer] life in [employer] and her husband works as an Sales executive at [employer]. The patient welcomed her 1st grandchild, a boy named [name] in May 2019.      ADVANCED DIRECTIVES: not in place   HEALTH MAINTENANCE: Social History   Tobacco Use  . Smoking status: Former Smoker    Packs/day: 0.50    Years: 20.00    Pack years: 10.00    Last attempt to quit: 10/21/2016    Years since quitting: 1.5  . Smokeless tobacco: Never Used    Substance Use Topics  . Alcohol use: No  . Drug use: No     Colonoscopy:  PAP:  Bone density:   No Known Allergies  Current Outpatient Medications  Medication Sig Dispense Refill  . acetaminophen (TYLENOL) 325 MG tablet Take 650 mg by mouth every 6 (six) hours as needed for moderate pain.     Marland Kitchen ALPRAZolam (XANAX XR) 1 MG 24 hr tablet Take 1 tablet (1 mg total) by mouth daily. 30 tablet 1  . busPIRone (BUSPAR) 30 MG tablet Take 15 mg by mouth daily.     . diphenhydrAMINE (BENADRYL) 25 MG tablet Take 50 mg by mouth as needed for sleep.     Marland Kitchen ibuprofen (ADVIL,MOTRIN) 200 MG tablet Take 600 mg by mouth every 6 (six) hours as needed for moderate pain.     Marland Kitchen letrozole (FEMARA) 2.5 MG tablet Take 1 tablet (2.5 mg total) by mouth daily. 90 tablet 4  . nystatin (MYCOSTATIN) 100000 UNIT/ML suspension Take 5 mLs (500,000 Units total) by mouth daily as needed (ulcers). 240 mL 6  . PARoxetine (PAXIL) 40 MG tablet Take 40 mg by mouth every morning.    . ranitidine (ZANTAC) 150 MG tablet Take 150 mg by mouth  daily as needed for heartburn.     Marland Kitchen tiZANidine (ZANAFLEX) 4 MG tablet TAKE 1 TABLET BY MOUTH Bedtime    . valACYclovir (VALTREX) 1000 MG tablet Take 1 tablet (1,000 mg total) by mouth daily. 90 tablet 2   No current facility-administered medications for this visit.    Facility-Administered Medications Ordered in Other Visits  Medication Dose Route Frequency Provider Last Rate Last Dose  . heparin lock flush 100 unit/mL  500 Units Intracatheter Once PRN Humbert Morozov, [name], MD         OBJECTIVE: Middle-aged white woman who appears stated age  Vitals:   04/22/18 1204  BP: 129/88  Pulse: 92  Resp: 17  Temp: 98.5 F (36.9 C)  SpO2: 100%     Body mass index is 30.66 kg/m.   Filed Weights   04/22/18 1204  Weight: 207 lb 9.6 oz (94.2 kg)     ECOG FS:0 - Asymptomatic   Sclerae unicteric, EOMs intact Oropharynx clear and moist No cervical or supraclavicular adenopathy Lungs no  rales or rhonchi Heart regular rate and rhythm Abd soft, nontender, positive bowel sounds MSK no focal spinal tenderness, no upper extremity lymphedema Neuro: nonfocal, well oriented, appropriate affect Breasts: Right breast is benign.  The left wall is imaged below.  Both axillae are benign.  Left Chest wall 04/22/2018      LAB RESULTS:  CMP     Component Value Date/Time   NA 138 04/03/2018 0804   NA 140 10/24/2017 1307   K 4.5 04/03/2018 0804   K 5.4 (H) 10/24/2017 1307   CL 105 04/03/2018 0804   CO2 24 04/03/2018 0804   CO2 29 10/24/2017 1307   GLUCOSE 102 04/03/2018 0804   GLUCOSE 103 10/24/2017 1307   BUN 16 04/03/2018 0804   BUN 18.2 10/24/2017 1307   CREATININE 0.78 04/03/2018 0804   CREATININE 0.8 10/24/2017 1307   CALCIUM 9.3 04/03/2018 0804   CALCIUM 10.3 10/24/2017 1307   PROT 6.5 04/03/2018 0804   PROT 7.5 10/24/2017 1307   ALBUMIN 4.0 04/03/2018 0804   ALBUMIN 4.0 10/24/2017 1307   AST 11 04/03/2018 0804   AST 9 10/24/2017 1307   ALT 10 04/03/2018 0804   ALT 8 10/24/2017 1307   ALKPHOS 135 04/03/2018 0804   ALKPHOS 115 10/24/2017 1307   BILITOT 0.3 04/03/2018 0804   BILITOT 0.28 10/24/2017 1307   GFRNONAA >60 04/03/2018 0804   GFRAA >60 04/03/2018 0804    INo results found for: SPEP, UPEP  Lab Results  Component Value Date   WBC 6.5 04/22/2018   NEUTROABS 4.8 04/22/2018   HGB 12.9 04/22/2018   HCT 39.7 04/22/2018   MCV 83.7 04/22/2018   PLT 252 04/22/2018      Chemistry      Component Value Date/Time   NA 138 04/03/2018 0804   NA 140 10/24/2017 1307   K 4.5 04/03/2018 0804   K 5.4 (H) 10/24/2017 1307   CL 105 04/03/2018 0804   CO2 24 04/03/2018 0804   CO2 29 10/24/2017 1307   BUN 16 04/03/2018 0804   BUN 18.2 10/24/2017 1307   CREATININE 0.78 04/03/2018 0804   CREATININE 0.8 10/24/2017 1307      Component Value Date/Time   CALCIUM 9.3 04/03/2018 0804   CALCIUM 10.3 10/24/2017 1307   ALKPHOS 135 04/03/2018 0804   ALKPHOS 115  10/24/2017 1307   AST 11 04/03/2018 0804   AST 9 10/24/2017 1307   ALT 10 04/03/2018 0804  ALT 8 10/24/2017 1307   BILITOT 0.3 04/03/2018 0804   BILITOT 0.28 10/24/2017 1307       No results found for: LABCA2  No components found for: LABCA125  No results for input(s): INR in the last 168 hours.  Urinalysis    Component Value Date/Time   COLORURINE STRAW (A) 10/30/2017 2050   APPEARANCEUR CLEAR 10/30/2017 2050   LABSPEC 1.006 10/30/2017 2050   [place] 6.0 10/30/2017 2050   GLUCOSEU NEGATIVE 10/30/2017 2050   [place] NEGATIVE 10/30/2017 2050   [place] NEGATIVE 10/30/2017 2050   [place] 10/30/2017 2050   PROTEINUR NEGATIVE 10/30/2017 2050   NITRITE NEGATIVE 10/30/2017 2050   LEUKOCYTESUR NEGATIVE 10/30/2017 2050     STUDIES: Nm Pet Image Restag (ps) Skull Base To Thigh  Result Date: 03/25/2018 CLINICAL DATA:  Subsequent treatment strategy for breast cancer. EXAM: NUCLEAR MEDICINE PET SKULL BASE TO THIGH TECHNIQUE: 10.4 mCi F-18 FDG was injected intravenously. Full-ring PET imaging was performed from the skull base to thigh after the radiotracer. CT data was obtained and used for attenuation correction and anatomic localization. Fasting blood glucose: 130 mg/dl COMPARISON:  CT chest dated 08/15/2017 FINDINGS: Mediastinal blood pool activity: SUV max 2.9 NECK: No hypermetabolic cervical lymphadenopathy. Incidental CT findings: none CHEST: 12 mm short axis left IMA node (series 4/image 69), new, max SUV 6.9, suspicious for nodal metastasis. Otherwise, no suspicious mediastinal, hilar, or axillary lymphadenopathy. Status post left axillary lymph node dissection. Status post left mastectomy. No suspicious pulmonary nodules. Incidental CT findings: Mild atherosclerotic calcifications of the aortic arch. Right chest port terminates the cavoatrial junction. ABDOMEN/PELVIS: No hypermetabolic lymphadenopathy in the abdomen/pelvis. No abnormal hypermetabolism in the liver,  spleen, pancreas, or adrenal glands. Incidental CT findings: Status post cholecystectomy. Atherosclerotic calcifications the abdominal aorta and branch vessels. Sigmoid diverticulosis, without evidence of diverticulitis. Trace pelvic ascites. SKELETON: No focal hypermetabolic activity to suggest skeletal metastasis. Incidental CT findings: none IMPRESSION: 12 mm short axis left IMA node, suspicious for nodal metastasis. Status post left mastectomy with left axillary lymph node dissection. Electronically Signed   By: Julian Hy M.D.   On: 03/25/2018 14:26     ELIGIBLE FOR AVAILABLE RESEARCH PROTOCOL: no  ASSESSMENT: 62 y.o. [employer], [place] woman status post left breast upper inner quadrant biopsy 09/30/2016 for a clinical T2 N0, stage II a invasive ductal carcinoma, grade 3, estrogen receptor positive, progesterone receptor negative, HER-2 not amplified, with an MIB-1 of 80%.  (1) genetics testing pending  (2) neoadjuvant chemotherapy given between 10/30/2016 and 03/12/2017 consisting of cyclophosphamide and doxorubicin in dose dense fashion 4  followed by paclitaxel 1, then Abraxane (x8?)  further treatments discontinued because of neuropathy.  (3) status post left lumpectomy with oncoplasty and right reduction mammoplasty showing a residual T2 N1 invasive ductal carcinoma, with lymphovascular invasion, but negative margins; the tumor is now HER-2 positive.  (a) postop PET scan showed no evidence of metastatic disease.  (4) adjuvant trastuzumab/pertuzumab started 06/20/2017 (received at [facility]), started here on 07/11/2017--continued through April 2019 when patient left for [place]  (a) Echo on 06/18/2017 at [facility]: LVEF 55-60%  (b) echocardiogram 09/26/2017 shows an ejection fraction in the 60-65% range.  (c) echocardiogram 12/26/2017 shows an ejection fraction in the 55-60% range  (5) adjuvant radiation was to follow, but see (7) below  (6) anti-estrogens: Letrozole daily started  03/26/2017  (a) DEXA on 04/03/2017 demonstrated a T score of -1.3 in the right femoral neck  (b) Zometa given at [facility] on 06/20/2017  RECURRENCE: (7) Patient developed left inner breast nodule and underwent mammogram and ultrasound on 08/06/17 showing three areas of concern at 10 o'clock, 1130 o'clock, and at 8 o'clock. Biopsy on 08/07/2017 demonstrated at 11:30 fat necrosis, however at 10:00 it was positive for IDC, grade 3, ER+(50%), PR-(0%), Ki-67 90%, HER-2 negative (ratio 1.48).  (a) staging chest CT 08/15/2017 shows 0.5 cm left lower lobe lung nodule of uncertain significance  (8) status post left modified radical mastectomy and right simple mastectomy 08/25/2017 showing  (a) right breast, no evidence of malignancy  (b) left breast, ypT2 ypN1 invasive ductal carcinoma, grade 3, with negative margins; repeat prognostic panel: Triple negative  (9) started adjuvant chemotherapy with cyclophosphamide/methotrexate/fluorouracil [CMF] 11/10/2016, eight cycles planned  (a) continuing trastuzumab/Pertuzumab   (b) CMF chemotherapy discontinued after 4 cycles with disease progression  (10) status post left chest wall biopsy for further local recurrence 01/05/2018, the tumor being now weakly estrogen receptor positive, progesterone receptor negative, and again HER-2 negative  (11) PET scan 03/25/2018 negative except for a 1.2 cm left internal mammary lymph node, SUV 6.9  (12) adjuvant radiation completed 03/19/2018  (a) did not receive capecitabine sensitization  (13)  Letrozole resumed 03/27/2018  (14) foundation one 01/09/2018 shows no actionable mutations, with stable microsatellite status and low mutational burden.  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  (a) PD-L1 testing requested 02/02/2018--re-requested 03/27/2018  (15) trastuzumab and Pertuzumab resumed 03/31/2018  (a) echocardiogram on 04/20/2018 showed an ejection fraction in the 50-55% range.    PLAN:  Aleighna is tolerating the current  treatment well and clinically she shows no evidence of disease progression.  Her echocardiogram was minimally decreased.  These variations are usually not significant unless there is a trend and of course we will be rechecking an echocardiogram in March.  We discussed her chest wall wound, which is healing by secondary intention.  I have recommended wet-to-dry dressings which she has not started yet.  We went over that again today.  I also suggest that she meet with her surgeon sometime within the next month  She wants to move the next treatment back a week because she is going to be bringing her family to town from [place].  Accordingly I upped her trastuzumab dose today to account for the weeks delay  We discussed restaging studies.  She will see me again August 12, 2018 and she will have a PET scan 2 days before that.  Otherwise she knows to call for any problems that may develop before the next visit.    Arlene Brickel, [name], MD  04/22/18 12:21 PM Medical Oncology and Hematology [facility] [address] [place], [place] [address] Tel. [phone]    Fax. [phone]  Alice Rieger, am acting as scribe for Chauncey Cruel MD.   I, Lurline Del MD, have reviewed the above documentation for accuracy and completeness, and I agree with the above.

## 2018-04-22 ENCOUNTER — Inpatient Hospital Stay: Payer: BLUE CROSS/BLUE SHIELD | Attending: Oncology | Admitting: Oncology

## 2018-04-22 ENCOUNTER — Telehealth: Payer: Self-pay

## 2018-04-22 ENCOUNTER — Inpatient Hospital Stay: Payer: BLUE CROSS/BLUE SHIELD

## 2018-04-22 VITALS — BP 129/88 | HR 92 | Temp 98.5°F | Resp 17 | Ht 69.0 in | Wt 207.6 lb

## 2018-04-22 DIAGNOSIS — C7951 Secondary malignant neoplasm of bone: Secondary | ICD-10-CM | POA: Insufficient documentation

## 2018-04-22 DIAGNOSIS — Z170 Estrogen receptor positive status [ER+]: Secondary | ICD-10-CM | POA: Diagnosis not present

## 2018-04-22 DIAGNOSIS — C50212 Malignant neoplasm of upper-inner quadrant of left female breast: Secondary | ICD-10-CM | POA: Diagnosis not present

## 2018-04-22 DIAGNOSIS — Z95828 Presence of other vascular implants and grafts: Secondary | ICD-10-CM

## 2018-04-22 DIAGNOSIS — C50912 Malignant neoplasm of unspecified site of left female breast: Secondary | ICD-10-CM

## 2018-04-22 DIAGNOSIS — Z5112 Encounter for antineoplastic immunotherapy: Secondary | ICD-10-CM | POA: Insufficient documentation

## 2018-04-22 LAB — COMPREHENSIVE METABOLIC PANEL
ALBUMIN: 4.4 g/dL (ref 3.5–5.0)
ALT: 13 U/L (ref 0–44)
AST: 14 U/L — AB (ref 15–41)
Alkaline Phosphatase: 157 U/L — ABNORMAL HIGH (ref 38–126)
Anion gap: 7 (ref 5–15)
BILIRUBIN TOTAL: 0.4 mg/dL (ref 0.3–1.2)
BUN: 16 mg/dL (ref 6–20)
CHLORIDE: 104 mmol/L (ref 98–111)
CO2: 27 mmol/L (ref 22–32)
CREATININE: 0.89 mg/dL (ref 0.44–1.00)
Calcium: 10.1 mg/dL (ref 8.9–10.3)
Glucose, Bld: 99 mg/dL (ref 70–99)
Potassium: 4.5 mmol/L (ref 3.5–5.1)
Sodium: 138 mmol/L (ref 135–145)
Total Protein: 7.1 g/dL (ref 6.5–8.1)

## 2018-04-22 LAB — CBC WITH DIFFERENTIAL/PLATELET
Basophils Absolute: 0.1 10*3/uL (ref 0.0–0.1)
Basophils Relative: 1 %
Eosinophils Absolute: 0.3 10*3/uL (ref 0.0–0.5)
Eosinophils Relative: 5 %
HCT: 39.7 % (ref 34.8–46.6)
Hemoglobin: 12.9 g/dL (ref 11.6–15.9)
Lymphocytes Relative: 14 %
Lymphs Abs: 0.9 10*3/uL (ref 0.9–3.3)
MCH: 27.2 pg (ref 25.1–34.0)
MCHC: 32.5 g/dL (ref 31.5–36.0)
MCV: 83.7 fL (ref 79.5–101.0)
Monocytes Absolute: 0.4 10*3/uL (ref 0.1–0.9)
Monocytes Relative: 6 %
Neutro Abs: 4.8 10*3/uL (ref 1.5–6.5)
Neutrophils Relative %: 74 %
Platelets: 252 10*3/uL (ref 145–400)
RBC: 4.74 MIL/uL (ref 3.70–5.45)
RDW: 15.8 % — ABNORMAL HIGH (ref 11.2–14.5)
WBC: 6.5 10*3/uL (ref 3.9–10.3)

## 2018-04-22 MED ORDER — DIPHENHYDRAMINE HCL 25 MG PO CAPS
ORAL_CAPSULE | ORAL | Status: AC
  Filled 2018-04-22: qty 1

## 2018-04-22 MED ORDER — ACETAMINOPHEN 325 MG PO TABS
ORAL_TABLET | ORAL | Status: AC
  Filled 2018-04-22: qty 2

## 2018-04-22 MED ORDER — BUSPIRONE HCL 30 MG PO TABS: 15.0000 mg | ORAL_TABLET | Freq: Every day | ORAL | 4 refills | Status: DC

## 2018-04-22 MED ORDER — SODIUM CHLORIDE 0.9% FLUSH
10.0000 mL | INTRAVENOUS | Status: DC | PRN
  Administered 2018-04-22: 10 mL
  Filled 2018-04-22: qty 10

## 2018-04-22 MED ORDER — SODIUM CHLORIDE 0.9% FLUSH
10.0000 mL | INTRAVENOUS | Status: DC | PRN
  Administered 2018-04-22: 10 mL via INTRAVENOUS
  Filled 2018-04-22: qty 10

## 2018-04-22 MED ORDER — HEPARIN SOD (PORK) LOCK FLUSH 100 UNIT/ML IV SOLN
500.0000 [IU] | Freq: Once | INTRAVENOUS | Status: AC | PRN
  Administered 2018-04-22: 500 [IU]
  Filled 2018-04-22: qty 5

## 2018-04-22 MED ORDER — TRASTUZUMAB CHEMO 150 MG IV SOLR
750.0000 mg | Freq: Once | INTRAVENOUS | Status: AC
  Administered 2018-04-22: 750 mg via INTRAVENOUS
  Filled 2018-04-22: qty 35.72

## 2018-04-22 MED ORDER — SODIUM CHLORIDE 0.9 % IV SOLN
420.0000 mg | Freq: Once | INTRAVENOUS | Status: AC
  Administered 2018-04-22: 420 mg via INTRAVENOUS
  Filled 2018-04-22: qty 14

## 2018-04-22 MED ORDER — ACETAMINOPHEN 325 MG PO TABS
650.0000 mg | ORAL_TABLET | Freq: Once | ORAL | Status: AC
  Administered 2018-04-22: 650 mg via ORAL

## 2018-04-22 MED ORDER — DIPHENHYDRAMINE HCL 25 MG PO CAPS
25.0000 mg | ORAL_CAPSULE | Freq: Once | ORAL | Status: AC
  Administered 2018-04-22: 25 mg via ORAL

## 2018-04-22 MED ORDER — SODIUM CHLORIDE 0.9 % IV SOLN
Freq: Once | INTRAVENOUS | Status: AC
  Administered 2018-04-22: 13:00:00 via INTRAVENOUS

## 2018-04-22 NOTE — Telephone Encounter (Signed)
Per Dr Jana Hakim, Kerr to treat with echo results form 04/20/18

## 2018-04-22 NOTE — Patient Instructions (Signed)
[place] Discharge Instructions for Patients Receiving Chemotherapy  Today you received the following chemotherapy agents trastuzumab (Herceptin) and pertuzumab (Perjeta)  To help prevent nausea and vomiting after your treatment, we encourage you to take your nausea medication as directed by your doctor.   If you develop nausea and vomiting that is not controlled by your nausea medication, call the clinic.   BELOW ARE SYMPTOMS THAT SHOULD BE REPORTED IMMEDIATELY:  *FEVER GREATER THAN 100.5 F  *CHILLS WITH OR WITHOUT FEVER  NAUSEA AND VOMITING THAT IS NOT CONTROLLED WITH YOUR NAUSEA MEDICATION  *UNUSUAL SHORTNESS OF BREATH  *UNUSUAL BRUISING OR BLEEDING  TENDERNESS IN MOUTH AND THROAT WITH OR WITHOUT PRESENCE OF ULCERS  *URINARY PROBLEMS  *BOWEL PROBLEMS  UNUSUAL RASH Items with * indicate a potential emergency and should be followed up as soon as possible.  Feel free to call the clinic should you have any questions or concerns. The clinic phone number is [phone].  Please show the [facility] at check-in to the Emergency Department and triage nurse.

## 2018-04-22 NOTE — Progress Notes (Signed)
Herceptin dose today will be 8 mg/kg per MD - pt going OOT & will return for next Herceptin dose in 4 weeks. Kennith Center, Pharm.D., CPP 04/22/2018@12 :58 PM

## 2018-04-22 NOTE — Progress Notes (Signed)
Pt refused to stay for perjeta 30 minute post-observation.

## 2018-04-24 ENCOUNTER — Other Ambulatory Visit: Payer: BLUE CROSS/BLUE SHIELD

## 2018-04-24 ENCOUNTER — Ambulatory Visit: Payer: BLUE CROSS/BLUE SHIELD

## 2018-04-24 ENCOUNTER — Ambulatory Visit: Payer: BLUE CROSS/BLUE SHIELD | Admitting: Oncology

## 2018-04-29 ENCOUNTER — Other Ambulatory Visit: Payer: Self-pay | Admitting: Oncology

## 2018-04-29 NOTE — Progress Notes (Signed)
The biopsy obtained 01/05/2018 from the left chest wall confirms estrogen receptor positive at 70% but with weak staining intensity, progesterone receptor negative, HER-2 not amplified with a signals ratio of 1.57 and the number per cell 2.90, and the proliferation marker of 90%.

## 2018-05-15 ENCOUNTER — Other Ambulatory Visit: Payer: BLUE CROSS/BLUE SHIELD

## 2018-05-15 ENCOUNTER — Ambulatory Visit: Payer: BLUE CROSS/BLUE SHIELD

## 2018-05-19 ENCOUNTER — Other Ambulatory Visit: Payer: Self-pay | Admitting: *Deleted

## 2018-05-20 ENCOUNTER — Inpatient Hospital Stay: Payer: BLUE CROSS/BLUE SHIELD

## 2018-05-20 VITALS — BP 159/73 | HR 75 | Temp 98.9°F | Resp 18

## 2018-05-20 DIAGNOSIS — Z95828 Presence of other vascular implants and grafts: Secondary | ICD-10-CM | POA: Insufficient documentation

## 2018-05-20 DIAGNOSIS — C50212 Malignant neoplasm of upper-inner quadrant of left female breast: Secondary | ICD-10-CM

## 2018-05-20 DIAGNOSIS — Z170 Estrogen receptor positive status [ER+]: Principal | ICD-10-CM

## 2018-05-20 DIAGNOSIS — Z5112 Encounter for antineoplastic immunotherapy: Secondary | ICD-10-CM | POA: Diagnosis not present

## 2018-05-20 DIAGNOSIS — C50912 Malignant neoplasm of unspecified site of left female breast: Secondary | ICD-10-CM

## 2018-05-20 LAB — COMPREHENSIVE METABOLIC PANEL
ALBUMIN: 4.1 g/dL (ref 3.5–5.0)
ALK PHOS: 127 U/L — AB (ref 38–126)
ALT: 12 U/L (ref 0–44)
AST: 13 U/L — AB (ref 15–41)
Anion gap: 6 (ref 5–15)
BILIRUBIN TOTAL: 0.4 mg/dL (ref 0.3–1.2)
BUN: 13 mg/dL (ref 8–23)
CALCIUM: 9.3 mg/dL (ref 8.9–10.3)
CO2: 27 mmol/L (ref 22–32)
CREATININE: 0.86 mg/dL (ref 0.44–1.00)
Chloride: 106 mmol/L (ref 98–111)
GFR calc Af Amer: >60 mL/min (ref >60)
GLUCOSE: 127 mg/dL — AB (ref 70–99)
POTASSIUM: 4.3 mmol/L (ref 3.5–5.1)
Sodium: 139 mmol/L (ref 135–145)
TOTAL PROTEIN: 6.8 g/dL (ref 6.5–8.1)

## 2018-05-20 LAB — CBC WITH DIFFERENTIAL/PLATELET
BASOS ABS: 0.1 10*3/uL (ref 0.0–0.1)
Basophils Relative: 1 %
Eosinophils Absolute: 0.4 10*3/uL (ref 0.0–0.5)
Eosinophils Relative: 6 %
HEMATOCRIT: 36.9 % (ref 34.8–46.6)
HEMOGLOBIN: 12.1 g/dL (ref 11.6–15.9)
LYMPHS ABS: 0.8 10*3/uL — AB (ref 0.9–3.3)
Lymphocytes Relative: 13 %
MCH: 27.6 pg (ref 25.1–34.0)
MCHC: 32.8 g/dL (ref 31.5–36.0)
MCV: 84.1 fL (ref 79.5–101.0)
MONOS PCT: 6 %
Monocytes Absolute: 0.4 10*3/uL (ref 0.1–0.9)
Neutro Abs: 4.5 10*3/uL (ref 1.5–6.5)
Neutrophils Relative %: 74 %
PLATELETS: 249 10*3/uL (ref 145–400)
RBC: 4.39 MIL/uL (ref 3.70–5.45)
RDW: 16.3 % — ABNORMAL HIGH (ref 11.2–14.5)
WBC: 6 10*3/uL (ref 3.9–10.3)

## 2018-05-20 MED ORDER — DIPHENHYDRAMINE HCL 25 MG PO CAPS
25.0000 mg | ORAL_CAPSULE | Freq: Once | ORAL | Status: AC
  Administered 2018-05-20: 25 mg via ORAL

## 2018-05-20 MED ORDER — ACETAMINOPHEN 325 MG PO TABS
ORAL_TABLET | ORAL | Status: AC
  Filled 2018-05-20: qty 2

## 2018-05-20 MED ORDER — DIPHENHYDRAMINE HCL 25 MG PO CAPS
ORAL_CAPSULE | ORAL | Status: AC
  Filled 2018-05-20: qty 1

## 2018-05-20 MED ORDER — ACETAMINOPHEN 325 MG PO TABS
650.0000 mg | ORAL_TABLET | Freq: Once | ORAL | Status: AC
  Administered 2018-05-20: 650 mg via ORAL

## 2018-05-20 MED ORDER — SODIUM CHLORIDE 0.9 % IV SOLN
420.0000 mg | Freq: Once | INTRAVENOUS | Status: AC
  Administered 2018-05-20: 420 mg via INTRAVENOUS
  Filled 2018-05-20: qty 14

## 2018-05-20 MED ORDER — SODIUM CHLORIDE 0.9% FLUSH
10.0000 mL | Freq: Once | INTRAVENOUS | Status: AC
  Administered 2018-05-20: 10 mL
  Filled 2018-05-20: qty 10

## 2018-05-20 MED ORDER — SODIUM CHLORIDE 0.9 % IV SOLN
Freq: Once | INTRAVENOUS | Status: AC
  Administered 2018-05-20: 14:00:00 via INTRAVENOUS
  Filled 2018-05-20: qty 250

## 2018-05-20 MED ORDER — SODIUM CHLORIDE 0.9% FLUSH
10.0000 mL | INTRAVENOUS | Status: DC | PRN
  Administered 2018-05-20: 10 mL
  Filled 2018-05-20: qty 10

## 2018-05-20 MED ORDER — SODIUM CHLORIDE 0.9 % IV SOLN
6.0000 mg/kg | Freq: Once | INTRAVENOUS | Status: AC
  Administered 2018-05-20: 567 mg via INTRAVENOUS
  Filled 2018-05-20: qty 27

## 2018-05-20 MED ORDER — HEPARIN SOD (PORK) LOCK FLUSH 100 UNIT/ML IV SOLN
500.0000 [IU] | Freq: Once | INTRAVENOUS | Status: AC | PRN
  Administered 2018-05-20: 500 [IU]
  Filled 2018-05-20: qty 5

## 2018-05-20 NOTE — Patient Instructions (Signed)
[facility] Discharge Instructions for Patients Receiving Chemotherapy  Today you received the following chemotherapy agents :  Herceptin, Perjeta.  To help prevent nausea and vomiting after your treatment, we encourage you to take your nausea medication as prescribed.   If you develop nausea and vomiting that is not controlled by your nausea medication, call the clinic.   BELOW ARE SYMPTOMS THAT SHOULD BE REPORTED IMMEDIATELY:  *FEVER GREATER THAN 100.5 F  *CHILLS WITH OR WITHOUT FEVER  NAUSEA AND VOMITING THAT IS NOT CONTROLLED WITH YOUR NAUSEA MEDICATION  *UNUSUAL SHORTNESS OF BREATH  *UNUSUAL BRUISING OR BLEEDING  TENDERNESS IN MOUTH AND THROAT WITH OR WITHOUT PRESENCE OF ULCERS  *URINARY PROBLEMS  *BOWEL PROBLEMS  UNUSUAL RASH Items with * indicate a potential emergency and should be followed up as soon as possible.  Feel free to call the clinic should you have any questions or concerns. The clinic phone number is [phone].  Please show the CHEMO ALERT CARD at check-in to the Emergency Department and triage nurse.   

## 2018-05-20 NOTE — Progress Notes (Signed)
Pt refused to stay for 30 min. Observation post Perjeta.  Stated she was  doing fine and wished to be discharged.

## 2018-06-02 ENCOUNTER — Telehealth: Payer: Self-pay | Admitting: *Deleted

## 2018-06-02 ENCOUNTER — Inpatient Hospital Stay: Payer: BLUE CROSS/BLUE SHIELD | Attending: Oncology | Admitting: Medical

## 2018-06-02 VITALS — BP 158/88 | HR 99 | Temp 98.3°F | Resp 18 | Ht 69.0 in | Wt 209.1 lb

## 2018-06-02 DIAGNOSIS — R222 Localized swelling, mass and lump, trunk: Secondary | ICD-10-CM

## 2018-06-02 DIAGNOSIS — Z5112 Encounter for antineoplastic immunotherapy: Secondary | ICD-10-CM | POA: Insufficient documentation

## 2018-06-02 DIAGNOSIS — Z170 Estrogen receptor positive status [ER+]: Secondary | ICD-10-CM | POA: Diagnosis not present

## 2018-06-02 DIAGNOSIS — C50212 Malignant neoplasm of upper-inner quadrant of left female breast: Secondary | ICD-10-CM

## 2018-06-02 NOTE — Telephone Encounter (Signed)
[name] states she has a knot on her mastectomy scar. "it is the side that was did not have cancer"  Will come in today at 3:30 to see [name], PA  Message to scheduler.

## 2018-06-02 NOTE — Progress Notes (Signed)
Pt presents with lump on R breast near mastectomy incision.  Lump is hard and nonmobile.  No pain, tenderness, redness or signs of infection visible.  Afebrile.  Denies CP/SOB.

## 2018-06-04 NOTE — Progress Notes (Signed)
[facility] Progress Note   Debra [name] [id number] Oct 30, 1955 62 y.o.  Debra [name] is managed by Dr. Jana Hakim  Actively treated with chemotherapy/immunotherapy: yes  Current Therapy: Herceptin and Perjeta  Last Treated: 05/20/2018 (cycle 11, day 1)  Assessment: Plan:    Subcutaneous nodule of chest wall  Malignant neoplasm of upper-inner quadrant of left breast in female, estrogen receptor positive (HCC)   Subcutaneous nodule of the right chest wall: Debra [name] plans to contact her surgeon for early follow-up to evaluate the right chest wall nodule.  ER positive malignant neoplasm of left breast: The patient continues to be managed by Dr. Jana Hakim and is currently treated with Herceptin and Perjeta which she receives every 3 weeks.  Please see After Visit Summary for patient specific instructions.  Future Appointments  Date Time Provider [place]  06/10/2018  8:15 AM [facility] LAB 6 [facility] None  06/10/2018  8:30 AM [facility] [place] FLUSH [facility] None  06/10/2018  9:30 AM [facility] INFUSION [facility] None  07/01/2018  8:00 AM [facility] LAB 3 [facility] None  07/01/2018  8:15 AM [facility] [place] FLUSH [facility] None  07/01/2018  9:15 AM [facility] INFUSION [facility] None  07/22/2018  8:45 AM [facility] LAB 4 [facility] None  07/22/2018  9:15 AM [facility] [place] FLUSH [facility] None  07/22/2018 10:15 AM [facility] INFUSION [facility] None  08/12/2018 11:45 AM [facility] LAB 4 [facility] None  08/12/2018 12:00 PM [facility] [place] FLUSH [facility] None  08/12/2018 12:30 PM [name], [name], MD [facility] None  08/12/2018  1:15 PM [facility] INFUSION [facility] None    No orders of the defined types were placed in this encounter.      Subjective:   Patient ID:  Debra [name] is a 62 y.o. (DOB 08-30-1956) female.  Chief Complaint:  Chief Complaint  Patient presents with  . Mass    HPI Debra [name] is a 62 year old  female with a recurrent estrogen receptor positive, HER-2 positive breast cancer who is managed by Dr. Jana Hakim and is treated with adjuvant trastuzumab/ Pertuzumab, letrozole, and zolendronate. She presents with a new "hard" nodule in her right chest wall immediately above the middle of her right chest wall surgical incision.  She has an upcoming appointment with her surgeon Dr. Beverely Pace of [facility].  She denies unexpected weight changes, fevers, chills, or sweats.  She has had no recent trauma or changes in activity.  Medications: I have reviewed the patient's current medications.  Allergies: No Known Allergies  Past Medical History:  Diagnosis Date  . Anxiety   . CAP (community acquired pneumonia)   . Depression   . Malignant neoplasm of upper-inner quadrant of left female breast ([place]) 10/01/2016  . Personal history of chemotherapy   . Tobacco use     Past Surgical History:  Procedure Laterality Date  . ABDOMINAL HYSTERECTOMY  2011   Laparoscopic  . AUGMENTATION MAMMAPLASTY Bilateral 03/2017  . BREAST LUMPECTOMY Left 04/05/2017  . BREAST LUMPECTOMY WITH SENTINEL LYMPH NODE BIOPSY Left 04/07/2017  . BREAST REDUCTION SURGERY Left 04/07/2017  . CHOLECYSTECTOMY    . MASTECTOMY Bilateral 08/25/2017   at [facility]  . open reduction left ankle    . SKIN GRAFT Left 06/19/2017   bil skin grafts to breasts    Family History  Problem Relation Age of Onset  . Heart disease Mother   . Heart disease Father   . Cancer Sister        ovarian  ca    Social History   Socioeconomic History  . Marital status: Married    Spouse name: Not on file  . Number of children: 1  . Years of education: Not on file  . Highest education level: Not on file  Occupational History  . Not on file  Social Needs  . Financial resource strain: Not on file  . Food insecurity:    Worry: Not on file    Inability: Not on file  . Transportation needs:     Medical: Not on file    Non-medical: Not on file  Tobacco Use  . Smoking status: Former Smoker    Packs/day: 0.50    Years: 20.00    Pack years: 10.00    Last attempt to quit: 10/21/2016    Years since quitting: 1.6  . Smokeless tobacco: Never Used  Substance and Sexual Activity  . Alcohol use: No  . Drug use: No  . Sexual activity: Not on file  Lifestyle  . Physical activity:    Days per week: Not on file    Minutes per session: Not on file  . Stress: Not on file  Relationships  . Social connections:    Talks on phone: Not on file    Gets together: Not on file    Attends religious service: Not on file    Active member of club or organization: Not on file    Attends meetings of clubs or organizations: Not on file    Relationship status: Not on file  . Intimate partner violence:    Fear of current or ex partner: Not on file    Emotionally abused: Not on file    Physically abused: Not on file    Forced sexual activity: Not on file  Other Topics Concern  . Not on file  Social History Narrative  . Not on file    Past Medical History, Surgical history, Social history, and Family history were reviewed and updated as appropriate.   Please see review of systems for further details on the patient's review from today.   Review of Systems:  Review of Systems  Constitutional: Negative for activity change, chills, diaphoresis, fatigue, fever and unexpected weight change.  Respiratory: Negative for cough and chest tightness.   Cardiovascular: Negative for chest pain.  Skin:       New "hard" nodule in her right chest wall immediately above the middle of her right chest wall surgical incision.     Objective:   Physical Exam:  BP (!) 158/88 (BP Location: Left Arm, Patient Position: Sitting) Comment: Liza RN is aware  Pulse 99   Temp 98.3 F (36.8 C) (Oral)   Resp 18   Ht _0  (1.753 m)   Wt 209 lb 1.6 oz (94.8 kg)   SpO2 98%   BMI 30.88 kg/m  ECOG: 0  Physical Exam    Constitutional: No distress.  HENT:  Head: Normocephalic and atraumatic.  Cardiovascular: Normal rate, regular rhythm and normal heart sounds. Exam reveals no gallop and no friction rub.  No murmur heard.   Pulmonary/Chest: Effort normal and breath sounds normal. No stridor. No respiratory distress. She has no wheezes. She has no rales.  Neurological: She is alert. Coordination normal.  Skin: Skin is warm and dry. No rash noted. She is not diaphoretic. No erythema. No pallor.  Psychiatric: She has a normal mood and affect. Her behavior is normal. Judgment and thought content normal.    Lab Review:  Component Value Date/Time   NA 139 05/20/2018 1137   NA 140 10/24/2017 1307   K 4.3 05/20/2018 1137   K 5.4 (H) 10/24/2017 1307   CL 106 05/20/2018 1137   CO2 27 05/20/2018 1137   CO2 29 10/24/2017 1307   GLUCOSE 127 (H) 05/20/2018 1137   GLUCOSE 103 10/24/2017 1307   BUN 13 05/20/2018 1137   BUN 18.2 10/24/2017 1307   CREATININE 0.86 05/20/2018 1137   CREATININE 0.8 10/24/2017 1307   CALCIUM 9.3 05/20/2018 1137   CALCIUM 10.3 10/24/2017 1307   PROT 6.8 05/20/2018 1137   PROT 7.5 10/24/2017 1307   ALBUMIN 4.1 05/20/2018 1137   ALBUMIN 4.0 10/24/2017 1307   AST 13 (L) 05/20/2018 1137   AST 9 10/24/2017 1307   ALT 12 05/20/2018 1137   ALT 8 10/24/2017 1307   ALKPHOS 127 (H) 05/20/2018 1137   ALKPHOS 115 10/24/2017 1307   BILITOT 0.4 05/20/2018 1137   BILITOT 0.28 10/24/2017 1307   GFRNONAA >60 05/20/2018 1137   GFRAA >60 05/20/2018 1137       Component Value Date/Time   WBC 6.0 05/20/2018 1137   RBC 4.39 05/20/2018 1137   HGB 12.1 05/20/2018 1137   HGB 12.7 10/24/2017 1307   HCT 36.9 05/20/2018 1137   HCT 40.2 10/24/2017 1307   PLT 249 05/20/2018 1137   PLT 292 10/24/2017 1307   MCV 84.1 05/20/2018 1137   MCV 84.0 10/24/2017 1307   MCH 27.6 05/20/2018 1137   MCHC 32.8 05/20/2018 1137   RDW 16.3 (H) 05/20/2018 1137   RDW 14.9 (H) 10/24/2017 1307   LYMPHSABS  0.8 (L) 05/20/2018 1137   LYMPHSABS 1.3 10/24/2017 1307   MONOABS 0.4 05/20/2018 1137   MONOABS 0.2 10/24/2017 1307   EOSABS 0.4 05/20/2018 1137   EOSABS 0.7 (H) 10/24/2017 1307   BASOSABS 0.1 05/20/2018 1137   BASOSABS 0.1 10/24/2017 1307   -------------------------------  Imaging from last 24 hours (if applicable):  Radiology interpretation: No results found.

## 2018-06-05 ENCOUNTER — Other Ambulatory Visit: Payer: BLUE CROSS/BLUE SHIELD

## 2018-06-05 ENCOUNTER — Ambulatory Visit: Payer: BLUE CROSS/BLUE SHIELD

## 2018-06-08 ENCOUNTER — Other Ambulatory Visit: Payer: Self-pay | Admitting: Oncology

## 2018-06-09 ENCOUNTER — Other Ambulatory Visit: Payer: Self-pay | Admitting: *Deleted

## 2018-06-09 DIAGNOSIS — C50212 Malignant neoplasm of upper-inner quadrant of left female breast: Secondary | ICD-10-CM

## 2018-06-09 DIAGNOSIS — Z170 Estrogen receptor positive status [ER+]: Principal | ICD-10-CM

## 2018-06-09 MED ORDER — ALPRAZOLAM ER 1 MG PO TB24: 1.0000 mg | ORAL_TABLET | Freq: Every day | ORAL | 1 refills | Status: DC

## 2018-06-10 ENCOUNTER — Inpatient Hospital Stay: Payer: BLUE CROSS/BLUE SHIELD

## 2018-06-10 VITALS — BP 143/69 | HR 73 | Temp 98.6°F | Resp 17

## 2018-06-10 DIAGNOSIS — Z170 Estrogen receptor positive status [ER+]: Principal | ICD-10-CM

## 2018-06-10 DIAGNOSIS — Z95828 Presence of other vascular implants and grafts: Secondary | ICD-10-CM

## 2018-06-10 DIAGNOSIS — C50212 Malignant neoplasm of upper-inner quadrant of left female breast: Secondary | ICD-10-CM | POA: Diagnosis present

## 2018-06-10 DIAGNOSIS — C50912 Malignant neoplasm of unspecified site of left female breast: Secondary | ICD-10-CM

## 2018-06-10 DIAGNOSIS — R222 Localized swelling, mass and lump, trunk: Secondary | ICD-10-CM | POA: Diagnosis not present

## 2018-06-10 DIAGNOSIS — Z5112 Encounter for antineoplastic immunotherapy: Secondary | ICD-10-CM | POA: Diagnosis present

## 2018-06-10 LAB — COMPREHENSIVE METABOLIC PANEL
ALK PHOS: 130 U/L — AB (ref 38–126)
ALT: 13 U/L (ref 0–44)
AST: 13 U/L — ABNORMAL LOW (ref 15–41)
Albumin: 4.1 g/dL (ref 3.5–5.0)
Anion gap: 9 (ref 5–15)
BILIRUBIN TOTAL: 0.3 mg/dL (ref 0.3–1.2)
BUN: 13 mg/dL (ref 8–23)
CALCIUM: 9.4 mg/dL (ref 8.9–10.3)
CO2: 24 mmol/L (ref 22–32)
Chloride: 106 mmol/L (ref 98–111)
Creatinine, Ser: 0.81 mg/dL (ref 0.44–1.00)
GFR calc non Af Amer: >60 mL/min (ref >60)
GLUCOSE: 107 mg/dL — AB (ref 70–99)
Potassium: 4.5 mmol/L (ref 3.5–5.1)
SODIUM: 139 mmol/L (ref 135–145)
TOTAL PROTEIN: 7 g/dL (ref 6.5–8.1)

## 2018-06-10 LAB — CBC WITH DIFFERENTIAL/PLATELET
BASOS PCT: 1 %
Basophils Absolute: 0.1 10*3/uL (ref 0.0–0.1)
EOS ABS: 0.3 10*3/uL (ref 0.0–0.5)
Eosinophils Relative: 5 %
HCT: 39 % (ref 34.8–46.6)
Hemoglobin: 12.7 g/dL (ref 11.6–15.9)
Lymphocytes Relative: 10 %
Lymphs Abs: 0.7 10*3/uL — ABNORMAL LOW (ref 0.9–3.3)
MCH: 27.6 pg (ref 25.1–34.0)
MCHC: 32.7 g/dL (ref 31.5–36.0)
MCV: 84.5 fL (ref 79.5–101.0)
MONO ABS: 0.4 10*3/uL (ref 0.1–0.9)
MONOS PCT: 6 %
Neutro Abs: 5.3 10*3/uL (ref 1.5–6.5)
Neutrophils Relative %: 78 %
PLATELETS: 260 10*3/uL (ref 145–400)
RBC: 4.62 MIL/uL (ref 3.70–5.45)
RDW: 15.6 % — AB (ref 11.2–14.5)
WBC: 6.9 10*3/uL (ref 3.9–10.3)

## 2018-06-10 MED ORDER — SODIUM CHLORIDE 0.9% FLUSH
10.0000 mL | INTRAVENOUS | Status: DC | PRN
  Administered 2018-06-10: 10 mL via INTRAVENOUS
  Filled 2018-06-10: qty 10

## 2018-06-10 MED ORDER — DIPHENHYDRAMINE HCL 25 MG PO CAPS
ORAL_CAPSULE | ORAL | Status: AC
  Filled 2018-06-10: qty 1

## 2018-06-10 MED ORDER — ACETAMINOPHEN 325 MG PO TABS
650.0000 mg | ORAL_TABLET | Freq: Once | ORAL | Status: AC
  Administered 2018-06-10: 650 mg via ORAL

## 2018-06-10 MED ORDER — TRASTUZUMAB CHEMO 150 MG IV SOLR
6.0000 mg/kg | Freq: Once | INTRAVENOUS | Status: AC
  Administered 2018-06-10: 567 mg via INTRAVENOUS
  Filled 2018-06-10: qty 27

## 2018-06-10 MED ORDER — SODIUM CHLORIDE 0.9 % IV SOLN
Freq: Once | INTRAVENOUS | Status: AC
  Administered 2018-06-10: 09:00:00 via INTRAVENOUS
  Filled 2018-06-10: qty 250

## 2018-06-10 MED ORDER — DIPHENHYDRAMINE HCL 25 MG PO CAPS
25.0000 mg | ORAL_CAPSULE | Freq: Once | ORAL | Status: AC
  Administered 2018-06-10: 25 mg via ORAL

## 2018-06-10 MED ORDER — HEPARIN SOD (PORK) LOCK FLUSH 100 UNIT/ML IV SOLN
500.0000 [IU] | Freq: Once | INTRAVENOUS | Status: AC | PRN
  Administered 2018-06-10: 500 [IU]
  Filled 2018-06-10: qty 5

## 2018-06-10 MED ORDER — HEPARIN SOD (PORK) LOCK FLUSH 100 UNIT/ML IV SOLN
500.0000 [IU] | Freq: Once | INTRAVENOUS | Status: DC
  Filled 2018-06-10: qty 5

## 2018-06-10 MED ORDER — SODIUM CHLORIDE 0.9 % IV SOLN
420.0000 mg | Freq: Once | INTRAVENOUS | Status: AC
  Administered 2018-06-10: 420 mg via INTRAVENOUS
  Filled 2018-06-10: qty 14

## 2018-06-10 MED ORDER — ACETAMINOPHEN 325 MG PO TABS
ORAL_TABLET | ORAL | Status: AC
  Filled 2018-06-10: qty 2

## 2018-06-10 MED ORDER — SODIUM CHLORIDE 0.9% FLUSH
10.0000 mL | INTRAVENOUS | Status: DC | PRN
  Administered 2018-06-10: 10 mL
  Filled 2018-06-10: qty 10

## 2018-06-10 NOTE — Patient Instructions (Signed)
[facility] Discharge Instructions for Patients Receiving Chemotherapy  Today you received the following chemotherapy agents: Herceptin, Perjeta  To help prevent nausea and vomiting after your treatment, we encourage you to take your nausea medication as directed.   If you develop nausea and vomiting that is not controlled by your nausea medication, call the clinic.   BELOW ARE SYMPTOMS THAT SHOULD BE REPORTED IMMEDIATELY:  *FEVER GREATER THAN 100.5 F  *CHILLS WITH OR WITHOUT FEVER  NAUSEA AND VOMITING THAT IS NOT CONTROLLED WITH YOUR NAUSEA MEDICATION  *UNUSUAL SHORTNESS OF BREATH  *UNUSUAL BRUISING OR BLEEDING  TENDERNESS IN MOUTH AND THROAT WITH OR WITHOUT PRESENCE OF ULCERS  *URINARY PROBLEMS  *BOWEL PROBLEMS  UNUSUAL RASH Items with * indicate a potential emergency and should be followed up as soon as possible.  Feel free to call the clinic should you have any questions or concerns. The clinic phone number is [phone].  Please show the CHEMO ALERT CARD at check-in to the Emergency Department and triage nurse.   

## 2018-06-11 ENCOUNTER — Other Ambulatory Visit: Payer: Self-pay | Admitting: Oncology

## 2018-06-12 ENCOUNTER — Other Ambulatory Visit: Payer: Self-pay | Admitting: Oncology

## 2018-06-17 ENCOUNTER — Telehealth: Payer: Self-pay | Admitting: Oncology

## 2018-06-17 ENCOUNTER — Other Ambulatory Visit: Payer: Self-pay | Admitting: Oncology

## 2018-06-17 DIAGNOSIS — C50212 Malignant neoplasm of upper-inner quadrant of left female breast: Secondary | ICD-10-CM

## 2018-06-17 DIAGNOSIS — Z170 Estrogen receptor positive status [ER+]: Secondary | ICD-10-CM

## 2018-06-17 DIAGNOSIS — C50912 Malignant neoplasm of unspecified site of left female breast: Secondary | ICD-10-CM

## 2018-06-17 NOTE — Telephone Encounter (Signed)
Scheduled appt per 8/28 sch message - pt is aware of appt date and time.

## 2018-06-17 NOTE — Progress Notes (Unsigned)
I called [name] to discuss results of her chest wall biopsy which are again positive.  We do not yet have the prognostic panel on that.  I am setting her up for a PET scan.  She will return to see me on 09/09, and on oh 9/11 we will start T-DM 1.

## 2018-06-18 ENCOUNTER — Other Ambulatory Visit: Payer: Self-pay | Admitting: Oncology

## 2018-06-18 NOTE — Progress Notes (Signed)
Her 01/05/2018 sample for PD-L1 was quantity not sufficient. We have requested retesting on another sample.

## 2018-06-25 ENCOUNTER — Other Ambulatory Visit: Payer: Self-pay | Admitting: Oncology

## 2018-06-26 ENCOUNTER — Ambulatory Visit (HOSPITAL_COMMUNITY): Payer: BLUE CROSS/BLUE SHIELD

## 2018-06-26 ENCOUNTER — Other Ambulatory Visit: Payer: BLUE CROSS/BLUE SHIELD

## 2018-06-26 ENCOUNTER — Encounter (HOSPITAL_COMMUNITY)
Admission: RE | Admit: 2018-06-26 | Discharge: 2018-06-26 | Disposition: A | Discharge status: Home / Self Care | Payer: BLUE CROSS/BLUE SHIELD | Source: Ambulatory Visit | Attending: Oncology | Admitting: Oncology

## 2018-06-26 ENCOUNTER — Other Ambulatory Visit: Payer: Self-pay | Admitting: Oncology

## 2018-06-26 ENCOUNTER — Ambulatory Visit: Payer: BLUE CROSS/BLUE SHIELD

## 2018-06-26 DIAGNOSIS — Z170 Estrogen receptor positive status [ER+]: Secondary | ICD-10-CM | POA: Diagnosis present

## 2018-06-26 DIAGNOSIS — C50912 Malignant neoplasm of unspecified site of left female breast: Secondary | ICD-10-CM | POA: Insufficient documentation

## 2018-06-26 DIAGNOSIS — C50212 Malignant neoplasm of upper-inner quadrant of left female breast: Secondary | ICD-10-CM | POA: Insufficient documentation

## 2018-06-26 LAB — GLUCOSE, CAPILLARY: GLUCOSE-CAPILLARY: 65 mg/dL — AB (ref 70–99)

## 2018-06-26 MED ORDER — FLUDEOXYGLUCOSE F - 18 (FDG) INJECTION
10.3000 | Freq: Once | INTRAVENOUS | Status: AC | PRN
  Administered 2018-06-26: 10.3 via INTRAVENOUS

## 2018-06-28 NOTE — Progress Notes (Signed)
[place]  [phone] [phone]     ID: ANIDA DEOL DOB: 02-18-1956  MR#: [id number]  KY[facility]#:[id number]  Patient Care Team: Chesley Noon, MD as PCP - General (Family Medicine) Excell Seltzer, MD as Consulting Physician (General Surgery) [name], [name], MD as Consulting Physician (Oncology) Gery Pray, MD as Consulting Physician (Radiation Oncology) Collene Gobble, MD as Consulting Physician (Pulmonary Disease) Verdell Carmine, MD as Referring Physician (Internal Medicine) Larey Dresser, MD as Consulting Physician (Cardiology) Beverely Pace, MD as Referring Physician (Surgical Oncology) OTHER MD: Dr Lyda Jester [phone])  CHIEF COMPLAINT: estrogen receptor positive, HER-2 positive breast cancer  CURRENT TREATMENT: Adjuvant Trastuzumab/ Pertuzumab, zolendronate, fulvestrant. palbociclib   BREAST CANCER HISTORY: From the original intake note:  "[name]" had a fall while traveling living to [place] in their RV and injure her left upper arm and  her left breast . Approximately 3 weeks later she noted a mass in the left breast upper inner quadrant. It was somewhat tender. She brought this to medical attention and on 09/27/2016 underwent bilateral diagnostic mammography with tomography [facility] The breast density was category B. The right breast was benign. In the left breast upper inner quadrant there was a large lobulated hyperdense mass which by exam was firm and nonmobile. By ultrasonography in the 11:30 o'clock radius 4 cm from the nipple there was a mass extending to the subcutaneous tissues and measuring 4.9 cm. There was a small superficial fluid collection overlying this consistent with a hematoma. The left axilla was benign.  Biopsy of the left breast mass in question 09/30/2016 showed (SAA 17-02/04/2003) and invasive ductal carcinoma grade 3, estrogen receptor 80%  positive with moderate staining intensity, progesterone receptor negative, MIB-1 of 80%, and no HER-2 amplification, the signals ratio being 1.49 and the number per cell 2.60.  The patient's subsequent history is as detailed below  INTERVAL HISTORY: Trany returns today for follow-up and treatment of her biphenotypic cancer, accompanied by her daughter Benjie Karvonen. Wylma continues on every 3-week trastuzumab/ pertuzumab, with her next dose due 07/01/2018.  She has about 1-2 episodes of diarrhea within a week after treatment. She is able to drink plenty of fluids. She also feels some fatigue.   She also continues on letrozole, wih good tolerance. She denies issues with hot flashes or vaginal dryness.   She receives zolendronate every 12 weeks, with her most recent dose on 04/03/2018. She also tolerates this well without any complications from the injection.  Since her last visit, she underwent biopsy of a right chest wall mass at the mastectomy scar ([id number]) on 06/08/2018 at [facility] which showed: poorly differentiated carcinoma consistent with metastatic breast carcinoma. Repeat prognostic panel showed: estrogen receptor 60% positive with intermediate staining intensity. Progesterone receptor negative and HER 2 was negative with an IHC of (1+).    She completed a PET scan on 06/26/2018 showing: There has been interval decrease in radiotracer uptake associated with the medial left chest wall which may represent chest wall involvement by tumor versus postsurgical/post therapeutic changes.  Interval decrease in FDG uptake associated with previously noted hypermetabolic left internal mammary lymph node. There are several new soft tissue nodules along the anterior surface of the sternum which exhibit increased uptake and are suspicious for of progressive areas of chest wall involvement. New increased uptake associated with right internal mammary lymph node and right hilar lymph node. Cannot rule  out nodal metastasis. At least  4 new ill-defined nodular densities within the left upper lobe exhibit mild FDG uptake. Findings may reflectinflammatory nodules in response to external beam radiation to the left chest.    REVIEW OF SYSTEMS: Adaysha reports that she is doing well. During the day, she goes walking with her grandchild for exercise. Her daughter is here from [place] and is planning to relocate back to [facility]. The patient denies unusual headaches, visual changes, nausea, vomiting, or dizziness. There has been no unusual cough, phlegm production, or pleurisy. There has been no change in bowel or bladder habits. She denies unexplained fatigue or unexplained weight loss, bleeding, rash, or fever. A detailed review of systems was otherwise stable.    PAST MEDICAL HISTORY: Past Medical History:  Diagnosis Date  . Anxiety   . CAP (community acquired pneumonia)   . Depression   . Malignant neoplasm of upper-inner quadrant of left female breast ([place]) 10/01/2016  . Personal history of chemotherapy   . Tobacco use     PAST SURGICAL HISTORY: Past Surgical History:  Procedure Laterality Date  . ABDOMINAL HYSTERECTOMY  2011   Laparoscopic  . AUGMENTATION MAMMAPLASTY Bilateral 03/2017  . BREAST LUMPECTOMY Left 04/05/2017  . BREAST LUMPECTOMY WITH SENTINEL LYMPH NODE BIOPSY Left 04/07/2017  . BREAST REDUCTION SURGERY Left 04/07/2017  . CHOLECYSTECTOMY    . MASTECTOMY Bilateral 08/25/2017   at [facility]  . open reduction left ankle    . SKIN GRAFT Left 06/19/2017   bil skin grafts to breasts    FAMILY HISTORY Family History  Problem Relation Age of Onset  . Heart disease Mother   . Heart disease Father   . Cancer Sister        ovarian ca  the patient's father died at the age of 57 from a myocardial infarction. The patient's mother died at the age of 71 with congestive heart failure. The patient had one brother, 2 sisters. One of her sisters was diagnosed with ovarian cancer at  age 67 and died at age 4 from that disease  GYNECOLOGIC HISTORY:  No LMP recorded. Patient has had a hysterectomy. Menarche age 52, first live birth age 61. The patient is GX P1. She stopped having periods approximately 2008. She did not take hormone replacement. She used oral contraceptives for approximately 30 years with no complications.  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 Her husband [name] is retired from [employer]. Their daughter Francely Craw lives in [place] and is Mudlogger of the [employer] life in [employer] and her husband works as an Sales executive at [employer]. The patient welcomed her 1st grandchild, a boy named Kulzer in May 2019.      ADVANCED DIRECTIVES: not in place   HEALTH MAINTENANCE: Social History   Tobacco Use  . Smoking status: Former Smoker    Packs/day: 0.50    Years: 20.00    Pack years: 10.00    Last attempt to quit: 10/21/2016    Years since quitting: 1.6  . Smokeless tobacco: Never Used  Substance Use Topics  . Alcohol use: No  . Drug use: No     Colonoscopy:  PAP:  Bone density:   No Known Allergies  Current Outpatient Medications  Medication Sig Dispense Refill  . acetaminophen (TYLENOL) 325 MG tablet Take 650 mg by mouth every 6 (six) hours as needed for moderate pain.     Marland Kitchen ALPRAZolam (XANAX XR) 1  MG 24 hr tablet Take 1 tablet (1 mg total) by mouth daily. 30 tablet 1  . busPIRone (BUSPAR) 30 MG tablet Take 0.5 tablets (15 mg total) by mouth daily. 90 tablet 4  . diphenhydrAMINE (BENADRYL) 25 MG tablet Take 50 mg by mouth as needed for sleep.     Marland Kitchen ibuprofen (ADVIL,MOTRIN) 200 MG tablet Take 600 mg by mouth every 6 (six) hours as needed for moderate pain.     Marland Kitchen letrozole (FEMARA) 2.5 MG tablet Take 1 tablet (2.5 mg total) by mouth daily. 90 tablet 4  . nystatin (MYCOSTATIN) 100000 UNIT/ML suspension Take 5 mLs (500,000 Units total) by mouth daily as needed (ulcers).  (Patient not taking: Reported on 06/02/2018) 240 mL 6  . PARoxetine (PAXIL) 40 MG tablet Take 40 mg by mouth every morning.    . ranitidine (ZANTAC) 150 MG tablet Take 150 mg by mouth daily as needed for heartburn.     Marland Kitchen tiZANidine (ZANAFLEX) 4 MG tablet TAKE 1 TABLET BY MOUTH Bedtime    . valACYclovir (VALTREX) 1000 MG tablet Take 1 tablet (1,000 mg total) by mouth daily. 90 tablet 2   No current facility-administered medications for this visit.      OBJECTIVE: Middle-aged white woman in no acute distress  Vitals:   06/29/18 1222  BP: (!) 147/78  Pulse: 79  Resp: 18  Temp: 98.4 F (36.9 C)  SpO2: 100%     Body mass index is 30.78 kg/m.   Filed Weights   06/29/18 1222  Weight: 208 lb 6.4 oz (94.5 kg)     ECOG FS:1 - Symptomatic but completely ambulatory   Sclerae unicteric, pupils round and equal No cervical or supraclavicular adenopathy Lungs no rales or rhonchi Heart regular rate and rhythm Abd soft, nontender, positive bowel sounds MSK no focal spinal tenderness, no upper extremity lymphedema Neuro: nonfocal, well oriented, appropriate affect Breasts: Status post bilateral mastectomies.  The chest wall is imaged below.  The site of most recent biopsy is on the right  Left Chest wall 04/22/2018    Chest wall 06/29/2018 Chest Wall 06/29/2018    LAB RESULTS:  CMP     Component Value Date/Time   NA 139 06/10/2018 0815   NA 140 10/24/2017 1307   K 4.5 06/10/2018 0815   K 5.4 (H) 10/24/2017 1307   CL 106 06/10/2018 0815   CO2 24 06/10/2018 0815   CO2 29 10/24/2017 1307   GLUCOSE 107 (H) 06/10/2018 0815   GLUCOSE 103 10/24/2017 1307   BUN 13 06/10/2018 0815   BUN 18.2 10/24/2017 1307   CREATININE 0.81 06/10/2018 0815   CREATININE 0.8 10/24/2017 1307   CALCIUM 9.4 06/10/2018 0815   CALCIUM 10.3 10/24/2017 1307   PROT 7.0 06/10/2018 0815   PROT 7.5 10/24/2017 1307   ALBUMIN 4.1 06/10/2018 0815   ALBUMIN 4.0 10/24/2017 1307   AST 13 (L) 06/10/2018 0815    AST 9 10/24/2017 1307   ALT 13 06/10/2018 0815   ALT 8 10/24/2017 1307   ALKPHOS 130 (H) 06/10/2018 0815   ALKPHOS 115 10/24/2017 1307   BILITOT 0.3 06/10/2018 0815   BILITOT 0.28 10/24/2017 1307   GFRNONAA >60 06/10/2018 0815   GFRAA >60 06/10/2018 0815    INo results found for: SPEP, UPEP  Lab Results  Component Value Date   WBC 6.6 06/29/2018   NEUTROABS 4.9 06/29/2018   HGB 12.7 06/29/2018   HCT 38.9 06/29/2018   MCV 84.2 06/29/2018   PLT 252 06/29/2018  Chemistry      Component Value Date/Time   NA 139 06/10/2018 0815   NA 140 10/24/2017 1307   K 4.5 06/10/2018 0815   K 5.4 (H) 10/24/2017 1307   CL 106 06/10/2018 0815   CO2 24 06/10/2018 0815   CO2 29 10/24/2017 1307   BUN 13 06/10/2018 0815   BUN 18.2 10/24/2017 1307   CREATININE 0.81 06/10/2018 0815   CREATININE 0.8 10/24/2017 1307      Component Value Date/Time   CALCIUM 9.4 06/10/2018 0815   CALCIUM 10.3 10/24/2017 1307   ALKPHOS 130 (H) 06/10/2018 0815   ALKPHOS 115 10/24/2017 1307   AST 13 (L) 06/10/2018 0815   AST 9 10/24/2017 1307   ALT 13 06/10/2018 0815   ALT 8 10/24/2017 1307   BILITOT 0.3 06/10/2018 0815   BILITOT 0.28 10/24/2017 1307       No results found for: LABCA2  No components found for: LABCA125  No results for input(s): INR in the last 168 hours.  Urinalysis    Component Value Date/Time   COLORURINE STRAW (A) 10/30/2017 2050   APPEARANCEUR CLEAR 10/30/2017 2050   LABSPEC 1.006 10/30/2017 2050   [place] 6.0 10/30/2017 2050   GLUCOSEU NEGATIVE 10/30/2017 2050   [place] NEGATIVE 10/30/2017 2050   [place] NEGATIVE 10/30/2017 2050   [place] 10/30/2017 2050   PROTEINUR NEGATIVE 10/30/2017 2050   NITRITE NEGATIVE 10/30/2017 2050   LEUKOCYTESUR NEGATIVE 10/30/2017 2050     STUDIES: Nm Pet Image Restag (ps) Skull Base To Thigh  Result Date: 06/26/2018 CLINICAL DATA:  Subsequent treatment strategy for breast cancer. EXAM: NUCLEAR MEDICINE PET SKULL BASE TO  THIGH TECHNIQUE: 10.3 mCi F-18 FDG was injected intravenously. Full-ring PET imaging was performed from the skull base to thigh after the radiotracer. CT data was obtained and used for attenuation correction and anatomic localization. Fasting blood glucose: 65 mg/dl COMPARISON:  03/25/2018 FINDINGS: Mediastinal blood pool activity: SUV max 2.22 NECK: No hypermetabolic lymph nodes in the neck. Incidental CT findings: none CHEST: No hypermetabolic axillary or supraclavicular lymph nodes. There are several new soft tissue lesions identified along the anterior surface of the lower sternum. The largest measures 1.4 cm and has an SUV max of 4.7. Hypermetabolic left internal mammary lymph node measures 1.1 cm and has an SUV max of 2.7. Previously 1.2 cm within SUV max of 6.9. New right internal mammary lymph node measures 8 mm and has an SUV max of 2.3. New right infrahilar lymph node posterior to the right superior pulmonary vein measures 1 cm and has an SUV max of 3.94. Several new ill-defined nodular densities within the left upper lobe are identified, new from previous exam. Central left upper lobe ill-defined nodular density measures 1.2 cm and has an SUV max of 3.06. Lateral subpleural nodular density within the left upper lobe measures 1.6 cm and has an SUV max of 3.4. Adjacent nodule measures 1.6 cm and has an SUV max of 2.7. More anteriorly there is a subpleural nodule measuring 1 cm within SUV max of 2.13. Incidental CT findings: Interval decrease in tracer uptake within the medial left chest wall which on today's study has an SUV max of 2.11. Previously 3.92. ABDOMEN/PELVIS: No abnormal activity within the liver, spleen, pancreas, or adrenal glands. No hypermetabolic abdominal or pelvic lymph nodes. Incidental CT findings: Aortic atherosclerosis without aneurysm. Previous cholecystectomy. SKELETON: No focal hypermetabolic activity to suggest skeletal metastasis. Incidental CT findings: none IMPRESSION: 1. There  has been interval decrease in  radiotracer uptake associated with the medial left chest wall which may represent chest wall involvement by tumor versus postsurgical/post therapeutic changes. 2. Interval decrease in FDG uptake associated with previously noted hypermetabolic left internal mammary lymph node. 3. There are several new soft tissue nodules along the anterior surface of the sternum which exhibit increased uptake and are suspicious for of progressive areas of chest wall involvement. 4. New increased uptake associated with right internal mammary lymph node and right hilar lymph node. Cannot rule out nodal metastasis. 5. At least 4 new ill-defined nodular densities within the left upper lobe exhibit mild FDG uptake. Findings may reflect inflammatory nodules in response to external beam radiation to the left chest. Given the absence of hypermetabolic nodules in the left lower lobe and right lung this is favored. Metastatic disease is considered less likely but attention on follow-up imaging is advised. Electronically Signed   By: Kerby Moors M.D.   On: 06/26/2018 15:05     ELIGIBLE FOR AVAILABLE RESEARCH PROTOCOL: no  ASSESSMENT: 62 y.o. [employer], [place] woman status post left breast upper inner quadrant biopsy 09/30/2016 for a clinical T2 N0, stage II a invasive ductal carcinoma, grade 3, estrogen receptor positive, progesterone receptor negative, HER-2 not amplified, with an MIB-1 of 80%.  (1) genetics testing pending  (2) neoadjuvant chemotherapy given between 10/30/2016 and 03/12/2017 consisting of cyclophosphamide and doxorubicin in dose dense fashion 4  followed by paclitaxel 1, then Abraxane (x8?)  further treatments discontinued because of neuropathy.  (3) status post left lumpectomy with oncoplasty and right reduction mammoplasty showing a residual T2 N1 invasive ductal carcinoma, with lymphovascular invasion, but negative margins; the tumor is now HER-2 positive.  (a) postop PET  scan showed no evidence of metastatic disease.  (4) adjuvant trastuzumab/pertuzumab started 06/20/2017 (received at [facility]), started here on 07/11/2017--continued through April 2019 when patient left for [place]  (a) Echo on 06/18/2017 at [facility]: LVEF 55-60%  (b) echocardiogram 09/26/2017 shows an ejection fraction in the 60-65% range.  (c) echocardiogram 12/26/2017 shows an ejection fraction in the 55-60% range  (5) adjuvant radiation was to follow, but see (7) below  (6) anti-estrogens: Letrozole daily started 03/26/2017  (a) DEXA on 04/03/2017 demonstrated a T score of -1.3 in the right femoral neck  (b) Zometa given at [facility] on 06/20/2017  RECURRENCE: (7) Patient developed left inner breast nodule and underwent mammogram and ultrasound on 08/06/17 showing three areas of concern at 10 o'clock, 1130 o'clock, and at 8 o'clock. Biopsy on 08/07/2017 demonstrated at 11:30 fat necrosis, however at 10:00 it was positive for IDC, grade 3, ER+(50%), PR-(0%), Ki-67 90%, HER-2 negative (ratio 1.48).  (a) staging chest CT 08/15/2017 shows 0.5 cm left lower lobe lung nodule of uncertain significance  (8) status post left modified radical mastectomy and right simple mastectomy 08/25/2017 showing  (a) right breast, no evidence of malignancy  (b) left breast, ypT2 ypN1 invasive ductal carcinoma, grade 3, with negative margins; repeat prognostic panel: Triple negative  (9) started adjuvant chemotherapy with cyclophosphamide/methotrexate/fluorouracil [CMF] 11/10/2016, eight cycles planned  (a) continuing trastuzumab/Pertuzumab   (b) CMF chemotherapy discontinued after 4 cycles with disease progression  (10) status post left chest wall biopsy for further local recurrence 01/05/2018, the tumor being now weakly estrogen receptor positive, progesterone receptor negative, and again HER-2 negative  (11) PET scan 03/25/2018 negative except for a 1.2 cm left internal mammary lymph node, SUV 6.9  (12) adjuvant  radiation completed 03/19/2018  (a) did not receive capecitabine sensitization  (  13)  Letrozole resumed 03/27/2018--discontinued 06/29/2018  (14) foundation one 01/09/2018 shows no actionable mutations, with stable microsatellite status and low mutational burden.  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  (a) PD-L1 testing requested 02/02/2018--re-requested 03/27/2018  (15) trastuzumab and Pertuzumab resumed 03/31/2018  (a) echocardiogram on 04/20/2018 showed an ejection fraction in the 50-55% range.   (b) change to every 4-week treatments beginning 07/01/2018 to coordinate with fulvestrant  (16) fine-needle aspiration of a chest wall lesion on 06/04/2018 was read as "non-small cell carcinoma".  Punch biopsy obtained at the visit 06/08/2018 for prognostic panel determination showed poorly differentiated carcinoma, estrogen receptor positive at 60%, progesterone receptor negative, HER-2 negative by immunohistochemistry at 1+  (17) fulvestrant started 07/01/2018, repeat every 28 days  (a) to start palbociclib 125 mg daily, 21/7, as soon as available   PLAN:  Makylah's situation is complex and since her daughter is visiting from [place] we took the opportunity to go back to basics.  We reviewed the fact that the treatment of breast cancer has local and systemic components.  She has already had surgery and radiation.  Accordingly we need to lean on the systemic portion and that can be antiestrogens, anti-HER-2 treatments, chemotherapy, or in some cases pembrolizumab.  The situation is complex because prior biopsies have shown different prognostic profiles in Terry's breast cancer.  We have estrogen receptor positive biopsies we have triple negative biopsies and we have HER-2 positive biopsies.  The best way to think about this is that she has 3 different breast cancers and we need to treat the ones that are active.  Currently the anti-HER-2 component appears to be well controlled so we are making no changes in  the trastuzumab/Pertuzumab treatments except we are moving them to every 4 weeks to correlate with her change in antiestrogens.  As far as the antiestrogens goes we are stopping the letrozole and starting fulvestrant.  We discussed the possible toxicities, side effects and complications of this agent.  She will receive her first dose on 09 11, her second dose 09 25, and her third dose 07/29/2018, after which the treatment will be every 20 way days.  She will receive them the same day as the Herceptin and Perjeta.  She will also start palbociclib at 125 mg daily.  She understands she will take this 21 days off in 7 days off.  We discussed the possible benefits of these agents, the need for the change, and the possible toxicity side effects and complications of each of these agents.  I am going to see her specifically 07/29/2018 to make sure she is tolerating this well.  She will need a repeat PET scan sometime in December to reassess for response.  I am hopeful to be able to continue to avoid chemotherapy if we can obtain good disease control without resorting to that  She knows to call for any other issues that may develop before the next visit.    [name], [name], MD  06/29/18 12:48 PM Medical Oncology and Hematology [facility] [address] [place], [place] [address] Tel. [phone]    Fax. [phone]  Alice Rieger, am acting as scribe for Chauncey Cruel MD.  I, Lurline Del MD, have reviewed the above documentation for accuracy and completeness, and I agree with the above.  ]

## 2018-06-29 ENCOUNTER — Inpatient Hospital Stay: Payer: BLUE CROSS/BLUE SHIELD

## 2018-06-29 ENCOUNTER — Inpatient Hospital Stay: Payer: BLUE CROSS/BLUE SHIELD | Attending: Oncology | Admitting: Oncology

## 2018-06-29 VITALS — BP 147/78 | HR 79 | Temp 98.4°F | Resp 18 | Ht 69.0 in | Wt 208.4 lb

## 2018-06-29 DIAGNOSIS — Z5111 Encounter for antineoplastic chemotherapy: Secondary | ICD-10-CM | POA: Insufficient documentation

## 2018-06-29 DIAGNOSIS — C50212 Malignant neoplasm of upper-inner quadrant of left female breast: Secondary | ICD-10-CM

## 2018-06-29 DIAGNOSIS — Z5112 Encounter for antineoplastic immunotherapy: Secondary | ICD-10-CM | POA: Insufficient documentation

## 2018-06-29 DIAGNOSIS — Z170 Estrogen receptor positive status [ER+]: Secondary | ICD-10-CM

## 2018-06-29 DIAGNOSIS — C50912 Malignant neoplasm of unspecified site of left female breast: Secondary | ICD-10-CM

## 2018-06-29 DIAGNOSIS — C7951 Secondary malignant neoplasm of bone: Secondary | ICD-10-CM | POA: Diagnosis not present

## 2018-06-29 LAB — CBC WITH DIFFERENTIAL/PLATELET
BASOS ABS: 0.1 10*3/uL (ref 0.0–0.1)
BASOS PCT: 1 %
Eosinophils Absolute: 0.3 10*3/uL (ref 0.0–0.5)
Eosinophils Relative: 4 %
HEMATOCRIT: 38.9 % (ref 34.8–46.6)
HEMOGLOBIN: 12.7 g/dL (ref 11.6–15.9)
Lymphocytes Relative: 13 %
Lymphs Abs: 0.9 10*3/uL (ref 0.9–3.3)
MCH: 27.4 pg (ref 25.1–34.0)
MCHC: 32.5 g/dL (ref 31.5–36.0)
MCV: 84.2 fL (ref 79.5–101.0)
Monocytes Absolute: 0.5 10*3/uL (ref 0.1–0.9)
Monocytes Relative: 7 %
Neutro Abs: 4.9 10*3/uL (ref 1.5–6.5)
Neutrophils Relative %: 75 %
Platelets: 252 10*3/uL (ref 145–400)
RBC: 4.62 MIL/uL (ref 3.70–5.45)
RDW: 15.7 % — ABNORMAL HIGH (ref 11.2–14.5)
WBC: 6.6 10*3/uL (ref 3.9–10.3)

## 2018-06-29 LAB — COMPREHENSIVE METABOLIC PANEL
ALBUMIN: 4.2 g/dL (ref 3.5–5.0)
ALT: 14 U/L (ref 0–44)
AST: 15 U/L (ref 15–41)
Alkaline Phosphatase: 125 U/L (ref 38–126)
Anion gap: 12 (ref 5–15)
BUN: 15 mg/dL (ref 8–23)
CO2: 23 mmol/L (ref 22–32)
CREATININE: 0.83 mg/dL (ref 0.44–1.00)
Calcium: 9.8 mg/dL (ref 8.9–10.3)
Chloride: 106 mmol/L (ref 98–111)
GFR calc Af Amer: >60 mL/min (ref >60)
GFR calc non Af Amer: >60 mL/min (ref >60)
GLUCOSE: 87 mg/dL (ref 70–99)
Potassium: 4.2 mmol/L (ref 3.5–5.1)
Sodium: 141 mmol/L (ref 135–145)
TOTAL PROTEIN: 7.3 g/dL (ref 6.5–8.1)
Total Bilirubin: 0.3 mg/dL (ref 0.3–1.2)

## 2018-06-29 MED ORDER — PALBOCICLIB 125 MG PO CAPS: 125.0000 mg | ORAL_CAPSULE | Freq: Every day | ORAL | 4 refills | Status: DC

## 2018-06-30 ENCOUNTER — Telehealth: Payer: Self-pay | Admitting: Oncology

## 2018-06-30 ENCOUNTER — Other Ambulatory Visit: Payer: Self-pay | Admitting: Oncology

## 2018-06-30 ENCOUNTER — Telehealth: Payer: Self-pay | Admitting: Pharmacist

## 2018-06-30 NOTE — Telephone Encounter (Signed)
Oral Oncology Pharmacist Encounter  Received new prescription for Ibrance (palbociclib) for the second line endocrine-based treatment of the hormone receptor positive, Her-2 receptor negative, metastatic breast cancer (after progression on Femara) in conjunction with Faslodex injections, planned duration until disease progression or unacceptable toxicity.  Labs from 06/29/18 assessed, OK for treatment.  Current medication list in Epic reviewed, no significant DDIs with Ibrance identified.  Prescription has been e-scribed to the [facility] for benefits analysis and approval.  Oral Oncology Clinic will continue to follow for insurance authorization, copayment issues, initial counseling and start date.  Johny Drilling, PharmD, BCPS, BCOP  06/30/2018 10:22 AM Oral Oncology Clinic [phone]

## 2018-06-30 NOTE — Telephone Encounter (Signed)
Appt added patient notified /per 9/10 sch msg Also transferred her to [facility] regarding questions about infusions being cancelled per

## 2018-06-30 NOTE — Telephone Encounter (Signed)
Oral Chemotherapy Pharmacist Encounter   I spoke with patient for overview of: Ibrance.   Counseled patient on administration, dosing, side effects, monitoring, drug-food interactions, safe handling, storage, and disposal.  Patient will take Ibrance 125mg  capsules, 1 capsule by mouth once daily with food for 3 weeks on, 1 week off.  Patient knows to avoid grapefruit and grapefruit juice.  Ibrance start date: 07/02/18  Adverse effects include but are not limited to: fatigue, hair loss, GI upset, nausea, decreased blood counts, and increased upper respiratory infections. Patient will obtain anti diarrheal and alert the office of 4 or more loose stools above baseline.  Patient reminded of WBC check on Cycle 1 Day 14 for dose and ANC assessment. Patient needs lab appointment scheduled for 07/15/2018.  Reviewed with patient importance of keeping a medication schedule and plan for any missed doses.  Ms. Nessler voiced understanding and appreciation.   All questions answered. Medication reconciliation performed and medication/allergy list updated.  Insurance authorization has been submitted and is pending. We will follow-up with patient for continued medication acquisition once insurance authorization is obtained.  Patient with multiple questions about continued treatments of Herceptin and Perjeta. Per Dr. [name] note patient is to start Faslodex as well as continue on Herceptin and Perjeta tomorrow (07/01/2018). These appointments have not yet been scheduled. There also are not orders for the monoclonal antibodies or for Faslodex. Discussed this care coordination with collaborative practice RN, Marlon Pel, she will discuss with MD. Patient informed she would be getting a call from the office about these appointments once their coordination is completed.  Patient knows to call the office with questions or concerns. Oral Oncology Clinic will continue to follow.  Johny Drilling, PharmD, BCPS,  BCOP  06/30/2018   11:28 AM Oral Oncology Clinic [phone]

## 2018-07-01 ENCOUNTER — Inpatient Hospital Stay: Payer: BLUE CROSS/BLUE SHIELD

## 2018-07-01 ENCOUNTER — Ambulatory Visit: Payer: BLUE CROSS/BLUE SHIELD

## 2018-07-01 ENCOUNTER — Other Ambulatory Visit: Payer: BLUE CROSS/BLUE SHIELD

## 2018-07-01 ENCOUNTER — Telehealth: Payer: Self-pay | Admitting: Oncology

## 2018-07-01 VITALS — BP 137/57 | HR 92 | Temp 98.7°F | Resp 18

## 2018-07-01 DIAGNOSIS — Z170 Estrogen receptor positive status [ER+]: Secondary | ICD-10-CM

## 2018-07-01 DIAGNOSIS — C50212 Malignant neoplasm of upper-inner quadrant of left female breast: Secondary | ICD-10-CM

## 2018-07-01 DIAGNOSIS — Z5112 Encounter for antineoplastic immunotherapy: Secondary | ICD-10-CM | POA: Diagnosis not present

## 2018-07-01 DIAGNOSIS — Z95828 Presence of other vascular implants and grafts: Secondary | ICD-10-CM

## 2018-07-01 MED ORDER — FULVESTRANT 250 MG/5ML IM SOLN
500.0000 mg | Freq: Once | INTRAMUSCULAR | Status: AC
  Administered 2018-07-01: 500 mg via INTRAMUSCULAR

## 2018-07-01 MED ORDER — FULVESTRANT 250 MG/5ML IM SOLN
INTRAMUSCULAR | Status: AC
  Filled 2018-07-01: qty 5

## 2018-07-01 MED FILL — IBRANCE 125 MG CAPSULE: 125 | 28 days supply | Qty: 21 | Fill #0

## 2018-07-01 NOTE — Telephone Encounter (Signed)
Appts were added back after being accidentally cancelled per 9/10 sch message

## 2018-07-01 NOTE — Telephone Encounter (Signed)
Oral Oncology Patient Advocate Encounter  Confirmed with [facility] that [name] was picked up on 07/01/18.   [facility] Phone [phone] Fax [phone]

## 2018-07-01 NOTE — Progress Notes (Signed)
Pt. Tolerated injection well, No further problems or concerns noted. 

## 2018-07-01 NOTE — Patient Instructions (Signed)

## 2018-07-02 ENCOUNTER — Inpatient Hospital Stay: Payer: BLUE CROSS/BLUE SHIELD

## 2018-07-02 ENCOUNTER — Other Ambulatory Visit: Payer: BLUE CROSS/BLUE SHIELD

## 2018-07-02 ENCOUNTER — Other Ambulatory Visit: Payer: Self-pay | Admitting: Hematology and Oncology

## 2018-07-02 VITALS — BP 120/64 | HR 70 | Temp 97.6°F | Resp 18

## 2018-07-02 DIAGNOSIS — Z95828 Presence of other vascular implants and grafts: Secondary | ICD-10-CM

## 2018-07-02 DIAGNOSIS — C50912 Malignant neoplasm of unspecified site of left female breast: Secondary | ICD-10-CM

## 2018-07-02 DIAGNOSIS — Z5112 Encounter for antineoplastic immunotherapy: Secondary | ICD-10-CM | POA: Diagnosis not present

## 2018-07-02 DIAGNOSIS — C50212 Malignant neoplasm of upper-inner quadrant of left female breast: Secondary | ICD-10-CM

## 2018-07-02 DIAGNOSIS — Z170 Estrogen receptor positive status [ER+]: Secondary | ICD-10-CM

## 2018-07-02 MED ORDER — ZOLEDRONIC ACID 4 MG/100ML IV SOLN
4.0000 mg | Freq: Once | INTRAVENOUS | Status: AC
  Administered 2018-07-02: 4 mg via INTRAVENOUS
  Filled 2018-07-02: qty 100

## 2018-07-02 MED ORDER — SODIUM CHLORIDE 0.9 % IV SOLN
Freq: Once | INTRAVENOUS | Status: AC
  Administered 2018-07-02: 10:00:00 via INTRAVENOUS
  Filled 2018-07-02: qty 250

## 2018-07-02 MED ORDER — ACETAMINOPHEN 325 MG PO TABS
650.0000 mg | ORAL_TABLET | Freq: Once | ORAL | Status: AC
  Administered 2018-07-02: 650 mg via ORAL

## 2018-07-02 MED ORDER — DIPHENHYDRAMINE HCL 25 MG PO CAPS
ORAL_CAPSULE | ORAL | Status: AC
  Filled 2018-07-02: qty 2

## 2018-07-02 MED ORDER — SODIUM CHLORIDE 0.9 % IV SOLN
420.0000 mg | Freq: Once | INTRAVENOUS | Status: AC
  Administered 2018-07-02: 420 mg via INTRAVENOUS
  Filled 2018-07-02: qty 14

## 2018-07-02 MED ORDER — HEPARIN SOD (PORK) LOCK FLUSH 100 UNIT/ML IV SOLN
500.0000 [IU] | Freq: Once | INTRAVENOUS | Status: AC | PRN
  Administered 2018-07-02: 500 [IU]
  Filled 2018-07-02: qty 5

## 2018-07-02 MED ORDER — TRASTUZUMAB CHEMO 150 MG IV SOLR
6.0000 mg/kg | Freq: Once | INTRAVENOUS | Status: AC
  Administered 2018-07-02: 567 mg via INTRAVENOUS
  Filled 2018-07-02: qty 27

## 2018-07-02 MED ORDER — ACETAMINOPHEN 325 MG PO TABS
ORAL_TABLET | ORAL | Status: AC
  Filled 2018-07-02: qty 2

## 2018-07-02 MED ORDER — DIPHENHYDRAMINE HCL 25 MG PO CAPS
50.0000 mg | ORAL_CAPSULE | Freq: Once | ORAL | Status: AC
  Administered 2018-07-02: 50 mg via ORAL

## 2018-07-02 MED ORDER — SODIUM CHLORIDE 0.9% FLUSH
10.0000 mL | INTRAVENOUS | Status: DC | PRN
  Administered 2018-07-02: 10 mL via INTRAVENOUS
  Filled 2018-07-02: qty 10

## 2018-07-02 MED ORDER — SODIUM CHLORIDE 0.9% FLUSH
10.0000 mL | INTRAVENOUS | Status: DC | PRN
  Administered 2018-07-02: 10 mL
  Filled 2018-07-02: qty 10

## 2018-07-02 NOTE — Progress Notes (Signed)
Pt refused to stay for 30 minute observation period. VSS, pt stable upon dc.

## 2018-07-02 NOTE — Patient Instructions (Signed)
[facility] Discharge Instructions for Patients Receiving Chemotherapy  Today you received the following chemotherapy agents: Herceptin, Perjeta  To help prevent nausea and vomiting after your treatment, we encourage you to take your nausea medication as directed.   If you develop nausea and vomiting that is not controlled by your nausea medication, call the clinic.   BELOW ARE SYMPTOMS THAT SHOULD BE REPORTED IMMEDIATELY:  *FEVER GREATER THAN 100.5 F  *CHILLS WITH OR WITHOUT FEVER  NAUSEA AND VOMITING THAT IS NOT CONTROLLED WITH YOUR NAUSEA MEDICATION  *UNUSUAL SHORTNESS OF BREATH  *UNUSUAL BRUISING OR BLEEDING  TENDERNESS IN MOUTH AND THROAT WITH OR WITHOUT PRESENCE OF ULCERS  *URINARY PROBLEMS  *BOWEL PROBLEMS  UNUSUAL RASH Items with * indicate a potential emergency and should be followed up as soon as possible.  Feel free to call the clinic should you have any questions or concerns. The clinic phone number is [phone].  Please show the [facility] at check-in to the Emergency Department and triage nurse.   Zoledronic Acid injection (Hypercalcemia, Oncology) What is this medicine? ZOLEDRONIC ACID (ZOE le dron ik AS id) lowers the amount of calcium loss from bone. It is used to treat too much calcium in your blood from cancer. It is also used to prevent complications of cancer that has spread to the bone. This medicine may be used for other purposes; ask your health care provider or pharmacist if you have questions. COMMON BRAND NAME(S): Zometa What should I tell my health care provider before I take this medicine? They need to know if you have any of these conditions: -aspirin-sensitive asthma -cancer, especially if you are receiving medicines used to treat cancer -dental disease or wear dentures -infection -kidney disease -receiving corticosteroids like dexamethasone or prednisone -an unusual or allergic reaction to zoledronic acid, other  medicines, foods, dyes, or preservatives -pregnant or trying to get pregnant -breast-feeding How should I use this medicine? This medicine is for infusion into a vein. It is given by a health care professional in a hospital or clinic setting. Talk to your pediatrician regarding the use of this medicine in children. Special care may be needed. Overdosage: If you think you have taken too much of this medicine contact a poison control center or emergency room at once. NOTE: This medicine is only for you. Do not share this medicine with others. What if I miss a dose? It is important not to miss your dose. Call your doctor or health care professional if you are unable to keep an appointment. What may interact with this medicine? -certain antibiotics given by injection -NSAIDs, medicines for pain and inflammation, like ibuprofen or naproxen -some diuretics like bumetanide, furosemide -teriparatide -thalidomide This list may not describe all possible interactions. Give your health care provider a list of all the medicines, herbs, non-prescription drugs, or dietary supplements you use. Also tell them if you smoke, drink alcohol, or use illegal drugs. Some items may interact with your medicine. What should I watch for while using this medicine? Visit your doctor or health care professional for regular checkups. It may be some time before you see the benefit from this medicine. Do not stop taking your medicine unless your doctor tells you to. Your doctor may order blood tests or other tests to see how you are doing. Women should inform their doctor if they wish to become pregnant or think they might be pregnant. There is a potential for serious side effects to an unborn child.  Talk to your health care professional or pharmacist for more information. You should make sure that you get enough calcium and vitamin D while you are taking this medicine. Discuss the foods you eat and the vitamins you take with  your health care professional. Some people who take this medicine have severe bone, joint, and/or muscle pain. This medicine may also increase your risk for jaw problems or a broken thigh bone. Tell your doctor right away if you have severe pain in your jaw, bones, joints, or muscles. Tell your doctor if you have any pain that does not go away or that gets worse. Tell your dentist and dental surgeon that you are taking this medicine. You should not have major dental surgery while on this medicine. See your dentist to have a dental exam and fix any dental problems before starting this medicine. Take good care of your teeth while on this medicine. Make sure you see your dentist for regular follow-up appointments. What side effects may I notice from receiving this medicine? Side effects that you should report to your doctor or health care professional as soon as possible: -allergic reactions like skin rash, itching or hives, swelling of the face, lips, or tongue -anxiety, confusion, or depression -breathing problems -changes in vision -eye pain -feeling faint or lightheaded, falls -jaw pain, especially after dental work -mouth sores -muscle cramps, stiffness, or weakness -redness, blistering, peeling or loosening of the skin, including inside the mouth -trouble passing urine or change in the amount of urine Side effects that usually do not require medical attention (report to your doctor or health care professional if they continue or are bothersome): -bone, joint, or muscle pain -constipation -diarrhea -fever -hair loss -irritation at site where injected -loss of appetite -nausea, vomiting -stomach upset -trouble sleeping -trouble swallowing -weak or tired This list may not describe all possible side effects. Call your doctor for medical advice about side effects. You may report side effects to FDA at 1-800-FDA-1088. Where should I keep my medicine? This drug is given in a hospital or  clinic and will not be stored at home. NOTE: This sheet is a summary. It may not cover all possible information. If you have questions about this medicine, talk to your doctor, pharmacist, or health care provider.  2018 [employer] Standard (2014-03-05 14:19:39)

## 2018-07-03 ENCOUNTER — Ambulatory Visit (HOSPITAL_COMMUNITY): Payer: BLUE CROSS/BLUE SHIELD

## 2018-07-15 ENCOUNTER — Inpatient Hospital Stay: Payer: BLUE CROSS/BLUE SHIELD

## 2018-07-15 VITALS — BP 134/73 | HR 79 | Temp 99.1°F | Resp 18

## 2018-07-15 DIAGNOSIS — Z95828 Presence of other vascular implants and grafts: Secondary | ICD-10-CM

## 2018-07-15 DIAGNOSIS — C50212 Malignant neoplasm of upper-inner quadrant of left female breast: Secondary | ICD-10-CM

## 2018-07-15 DIAGNOSIS — Z170 Estrogen receptor positive status [ER+]: Secondary | ICD-10-CM

## 2018-07-15 DIAGNOSIS — Z5112 Encounter for antineoplastic immunotherapy: Secondary | ICD-10-CM | POA: Diagnosis not present

## 2018-07-15 MED ORDER — FULVESTRANT 250 MG/5ML IM SOLN
INTRAMUSCULAR | Status: AC
  Filled 2018-07-15: qty 5

## 2018-07-15 MED ORDER — FULVESTRANT 250 MG/5ML IM SOLN
500.0000 mg | Freq: Once | INTRAMUSCULAR | Status: AC
  Administered 2018-07-15: 500 mg via INTRAMUSCULAR

## 2018-07-15 NOTE — Patient Instructions (Signed)

## 2018-07-17 ENCOUNTER — Ambulatory Visit: Payer: BLUE CROSS/BLUE SHIELD

## 2018-07-17 ENCOUNTER — Other Ambulatory Visit: Payer: BLUE CROSS/BLUE SHIELD

## 2018-07-22 ENCOUNTER — Other Ambulatory Visit: Payer: BLUE CROSS/BLUE SHIELD

## 2018-07-22 ENCOUNTER — Ambulatory Visit: Payer: BLUE CROSS/BLUE SHIELD

## 2018-07-27 ENCOUNTER — Other Ambulatory Visit: Payer: Self-pay | Admitting: Oncology

## 2018-07-28 ENCOUNTER — Inpatient Hospital Stay: Payer: BLUE CROSS/BLUE SHIELD

## 2018-07-28 ENCOUNTER — Inpatient Hospital Stay: Payer: BLUE CROSS/BLUE SHIELD | Attending: Oncology

## 2018-07-28 DIAGNOSIS — Z5112 Encounter for antineoplastic immunotherapy: Secondary | ICD-10-CM | POA: Insufficient documentation

## 2018-07-28 DIAGNOSIS — C7951 Secondary malignant neoplasm of bone: Secondary | ICD-10-CM | POA: Insufficient documentation

## 2018-07-28 DIAGNOSIS — Z5111 Encounter for antineoplastic chemotherapy: Secondary | ICD-10-CM | POA: Insufficient documentation

## 2018-07-28 DIAGNOSIS — Z170 Estrogen receptor positive status [ER+]: Secondary | ICD-10-CM | POA: Insufficient documentation

## 2018-07-28 DIAGNOSIS — C50212 Malignant neoplasm of upper-inner quadrant of left female breast: Secondary | ICD-10-CM | POA: Diagnosis present

## 2018-07-28 DIAGNOSIS — Z95828 Presence of other vascular implants and grafts: Secondary | ICD-10-CM

## 2018-07-28 DIAGNOSIS — C50912 Malignant neoplasm of unspecified site of left female breast: Secondary | ICD-10-CM

## 2018-07-28 DIAGNOSIS — Z452 Encounter for adjustment and management of vascular access device: Secondary | ICD-10-CM | POA: Insufficient documentation

## 2018-07-28 LAB — CBC WITH DIFFERENTIAL/PLATELET
Abs Immature Granulocytes: 0.01 10*3/uL (ref 0.00–0.07)
Basophils Absolute: 0.1 10*3/uL (ref 0.0–0.1)
Basophils Relative: 2 %
EOS ABS: 0.1 10*3/uL (ref 0.0–0.5)
EOS PCT: 2 %
HCT: 34.3 % — ABNORMAL LOW (ref 36.0–46.0)
Hemoglobin: 11.1 g/dL — ABNORMAL LOW (ref 12.0–15.0)
Immature Granulocytes: 0 %
Lymphocytes Relative: 26 %
Lymphs Abs: 0.6 10*3/uL — ABNORMAL LOW (ref 0.7–4.0)
MCH: 28.8 pg (ref 26.0–34.0)
MCHC: 32.4 g/dL (ref 30.0–36.0)
MCV: 89.1 fL (ref 80.0–100.0)
Monocytes Absolute: 0.3 10*3/uL (ref 0.1–1.0)
Monocytes Relative: 10 %
Neutro Abs: 1.5 10*3/uL — ABNORMAL LOW (ref 1.7–7.7)
Neutrophils Relative %: 60 %
PLATELETS: 255 10*3/uL (ref 150–400)
RBC: 3.85 MIL/uL — AB (ref 3.87–5.11)
RDW: 17.4 % — ABNORMAL HIGH (ref 11.5–15.5)
WBC: 2.5 10*3/uL — AB (ref 4.0–10.5)
nRBC: 0 % (ref 0.0–0.2)

## 2018-07-28 LAB — COMPREHENSIVE METABOLIC PANEL
ALK PHOS: 120 U/L (ref 38–126)
ALT: 10 U/L (ref 0–44)
AST: 12 U/L — ABNORMAL LOW (ref 15–41)
Albumin: 3.9 g/dL (ref 3.5–5.0)
Anion gap: 8 (ref 5–15)
BUN: 12 mg/dL (ref 8–23)
CALCIUM: 9.4 mg/dL (ref 8.9–10.3)
CHLORIDE: 107 mmol/L (ref 98–111)
CO2: 25 mmol/L (ref 22–32)
CREATININE: 0.77 mg/dL (ref 0.44–1.00)
Glucose, Bld: 103 mg/dL — ABNORMAL HIGH (ref 70–99)
Potassium: 4.5 mmol/L (ref 3.5–5.1)
Sodium: 140 mmol/L (ref 135–145)
Total Bilirubin: 0.3 mg/dL (ref 0.3–1.2)
Total Protein: 6.7 g/dL (ref 6.5–8.1)

## 2018-07-28 MED ORDER — HEPARIN SOD (PORK) LOCK FLUSH 100 UNIT/ML IV SOLN
500.0000 [IU] | Freq: Once | INTRAVENOUS | Status: AC
  Administered 2018-07-28: 500 [IU] via INTRAVENOUS
  Filled 2018-07-28: qty 5

## 2018-07-28 MED ORDER — SODIUM CHLORIDE 0.9% FLUSH
10.0000 mL | INTRAVENOUS | Status: DC | PRN
  Administered 2018-07-28: 10 mL via INTRAVENOUS
  Filled 2018-07-28: qty 10

## 2018-07-28 MED FILL — IBRANCE 125 MG CAPSULE: 125 | 28 days supply | Qty: 21 | Fill #1

## 2018-07-28 NOTE — Progress Notes (Signed)
[facility]  Telephone:(336) [phone] Fax:(336) [phone]     ID: [name] DOB: 1956-04-08  MR#: [id number]  YKZ#:[id number]  Patient Care Team: Chesley Noon, MD as PCP - General (Family Medicine) Excell Seltzer, MD as Consulting Physician (General Surgery) [name], [name], MD as Consulting Physician (Oncology) Gery Pray, MD as Consulting Physician (Radiation Oncology) Collene Gobble, MD as Consulting Physician (Pulmonary Disease) Verdell Carmine, MD as Referring Physician (Internal Medicine) Larey Dresser, MD as Consulting Physician (Cardiology) Beverely Pace, MD as Referring Physician (Surgical Oncology) OTHER MD: Dr Lyda Jester [phone])  CHIEF COMPLAINT: estrogen receptor positive, HER-2 positive breast cancer  CURRENT TREATMENT: Adjuvant Trastuzumab/ Pertuzumab, zolendronate, fulvestrant. palbociclib   INTERVAL HISTORY: [name] returns today for follow-up and treatment of her biphenotypic cancer. She continues on trastuzumab/ pertuzumab given every 28 days, with a dose due today.    She also continues on palbociclib currently at 125 mg, 21 days on and 7 days off.  Today is day 1 cycle 2.  She feels that she had a little bit more diarrhea following the start of Ibrance.  She will have diarrhea every 2 or 3 days.  She takes one Imodium with the second diarrheal bowel movement and that absolutely takes care of the problem.  She has Questran available but has not had to use it.  She drinks a lot of Gatorade, sweet tea, and water.  She receives fulvestrant every 4 weeks, with a dose due today  Finally, she receives zolendronate, every 12 weeks, with her most recent dose on 07/02/2018.  Her next dose will be 09/24/2018  Her most recent staging study was a PET scan 06/26/2018 which showed measurable disease along the anterior sternum, right internal mammary area, right hilar area, and left upper lobe, some of which may be  inflammatory.  She is scheduled for a repeat PET scan 09/23/2018.  Her most recent echocardiogram was 04/20/2018.  The ejection fraction was 50-55%.    REVIEW OF SYSTEMS: Wannetta has been having some more night sweats.  She has one episode of minimal epistaxis.  She feels fatigued and sometimes needs to take a little nap in the early afternoon.  She still take some walks although not daily.  She feels the peripheral neuropathy in her feet and finger pads are bit worse.  Her appetite is okay.  She only had one episode of nausea.  She has no side effects that she is aware of from the zolendronate--she takes Tums on the day of treatment--and she is not aware of any problems from the trastuzumab or epratuzumab.  A detailed review of systems today was otherwise stable   BREAST CANCER HISTORY: From the original intake note:  "[name]" had a fall while traveling living to [place] in their RV and injure her left upper arm and  her left breast . Approximately 3 weeks later she noted a mass in the left breast upper inner quadrant. It was somewhat tender. She brought this to medical attention and on 09/27/2016 underwent bilateral diagnostic mammography with tomography [facility] The breast density was category B. The right breast was benign. In the left breast upper inner quadrant there was a large lobulated hyperdense mass which by exam was firm and nonmobile. By ultrasonography in the 11:30 o'clock radius 4 cm from the nipple there was a mass extending to the subcutaneous tissues and measuring 4.9 cm. There was a small superficial fluid collection overlying this consistent  with a hematoma. The left axilla was benign.  Biopsy of the left breast mass in question 09/30/2016 showed (SAA [id number]/04/[id number]) and invasive ductal carcinoma grade 3, estrogen receptor 80% positive with moderate staining intensity, progesterone receptor negative, MIB-1 of 80%, and no HER-2 amplification,  the signals ratio being 1.49 and the number per cell 2.60.  The patient's subsequent history is as detailed below   PAST MEDICAL HISTORY: Past Medical History:  Diagnosis Date  . Anxiety   . CAP (community acquired pneumonia)   . Depression   . Malignant neoplasm of upper-inner quadrant of left female breast ([place]) 10/01/2016  . Personal history of chemotherapy   . Tobacco use     PAST SURGICAL HISTORY: Past Surgical History:  Procedure Laterality Date  . ABDOMINAL HYSTERECTOMY  2011   Laparoscopic  . AUGMENTATION MAMMAPLASTY Bilateral 03/2017  . BREAST LUMPECTOMY Left 04/05/2017  . BREAST LUMPECTOMY WITH SENTINEL LYMPH NODE BIOPSY Left 04/07/2017  . BREAST REDUCTION SURGERY Left 04/07/2017  . CHOLECYSTECTOMY    . MASTECTOMY Bilateral 08/25/2017   at [facility]  . open reduction left ankle    . SKIN GRAFT Left 06/19/2017   bil skin grafts to breasts    FAMILY HISTORY Family History  Problem Relation Age of Onset  . Heart disease Mother   . Heart disease Father   . Cancer Sister        ovarian ca  the patient's father died at the age of 38 from a myocardial infarction. The patient's mother died at the age of 45 with congestive heart failure. The patient had one brother, 2 sisters. One of her sisters was diagnosed with ovarian cancer at age 49 and died at age 80 from that disease  GYNECOLOGIC HISTORY:  No LMP recorded. Patient has had a hysterectomy. Menarche age 53, first live birth age 42. The patient is GX P1. She stopped having periods approximately 2008. She did not take hormone replacement. She used oral contraceptives for approximately 30 years with no complications.  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 Her husband [name] is retired from [employer]. Their daughter [name] lives in [place] and is Mudlogger of the [employer] life in [employer] and her husband works as an Sales executive  at [employer]. The patient welcomed her 1st grandchild, a boy named [name] in May 2019.      ADVANCED DIRECTIVES: not in place   HEALTH MAINTENANCE: Social History   Tobacco Use  . Smoking status: Former Smoker    Packs/day: 0.50    Years: 20.00    Pack years: 10.00    Last attempt to quit: 10/21/2016    Years since quitting: 1.7  . Smokeless tobacco: Never Used  Substance Use Topics  . Alcohol use: No  . Drug use: No     Colonoscopy:  PAP:  Bone density:   No Known Allergies  Current Outpatient Medications  Medication Sig Dispense Refill  . acetaminophen (TYLENOL) 325 MG tablet Take 650 mg by mouth every 6 (six) hours as needed for moderate pain.     Marland Kitchen ALPRAZolam (XANAX XR) 1 MG 24 hr tablet Take 1 tablet (1 mg total) by mouth daily. 30 tablet 1  . busPIRone (BUSPAR) 30 MG tablet Take 0.5 tablets (15 mg total) by mouth daily. 90 tablet 4  . diphenhydrAMINE (BENADRYL) 25 MG tablet Take 50 mg by mouth as needed for sleep.     Marland Kitchen  ibuprofen (ADVIL,MOTRIN) 200 MG tablet Take 600 mg by mouth every 6 (six) hours as needed for moderate pain.     . palbociclib (IBRANCE) 125 MG capsule Take 1 capsule (125 mg total) by mouth daily with breakfast. Take whole with food. Take for 21 days on, 7 days off, repeat every 28 days. 21 capsule 4  . PARoxetine (PAXIL) 40 MG tablet Take 40 mg by mouth every morning.    . ranitidine (ZANTAC) 150 MG tablet Take 150 mg by mouth daily as needed for heartburn.     Marland Kitchen tiZANidine (ZANAFLEX) 4 MG tablet TAKE 1 TABLET BY MOUTH Bedtime    . valACYclovir (VALTREX) 1000 MG tablet Take 1 tablet (1,000 mg total) by mouth daily. 90 tablet 2   No current facility-administered medications for this visit.      OBJECTIVE: Middle-aged white woman who appears stated age  32:   07/29/18 0750  BP: (!) 159/83  Pulse: 100  Resp: 18  Temp: 98.4 F (36.9 C)  SpO2: 100%     Body mass index is 30.54 kg/m.   Filed Weights   07/29/18 0750  Weight: 206 lb 12.8  oz (93.8 kg)     ECOG FS:1 - Symptomatic but completely ambulatory   Sclerae unicteric, EOMs intact Oropharynx clear and moist No cervical or supraclavicular adenopathy Lungs no rales or rhonchi Heart regular rate and rhythm Abd soft, nontender, positive bowel sounds MSK no focal spinal tenderness, no upper extremity lymphedema Neuro: nonfocal, well oriented, appropriate affect Breasts: Status post bilateral mastectomies.  In the site of her most recent biopsy there is still a very minimal eschar, measuring about 2 mm.  A little bit inferior and medial to that there are a couple of skin spots that look pretty much like "pimples".  More medial to that however over the lower sternum there is a 3 mm subcutaneous lesion which is likely metastatic.  I do not palpate any abnormality on the right side.  Left Chest wall 04/22/2018    Chest wall 06/29/2018 Chest Wall 06/29/2018    LAB RESULTS:  CMP     Component Value Date/Time   NA 140 07/28/2018 1047   NA 140 10/24/2017 1307   K 4.5 07/28/2018 1047   K 5.4 (H) 10/24/2017 1307   CL 107 07/28/2018 1047   CO2 25 07/28/2018 1047   CO2 29 10/24/2017 1307   GLUCOSE 103 (H) 07/28/2018 1047   GLUCOSE 103 10/24/2017 1307   BUN 12 07/28/2018 1047   BUN 18.2 10/24/2017 1307   CREATININE 0.77 07/28/2018 1047   CREATININE 0.8 10/24/2017 1307   CALCIUM 9.4 07/28/2018 1047   CALCIUM 10.3 10/24/2017 1307   PROT 6.7 07/28/2018 1047   PROT 7.5 10/24/2017 1307   ALBUMIN 3.9 07/28/2018 1047   ALBUMIN 4.0 10/24/2017 1307   AST 12 (L) 07/28/2018 1047   AST 9 10/24/2017 1307   ALT 10 07/28/2018 1047   ALT 8 10/24/2017 1307   ALKPHOS 120 07/28/2018 1047   ALKPHOS 115 10/24/2017 1307   BILITOT 0.3 07/28/2018 1047   BILITOT 0.28 10/24/2017 1307   GFRNONAA >60 07/28/2018 1047   GFRAA >60 07/28/2018 1047    INo results found for: SPEP, UPEP  Lab Results  Component Value Date   WBC 2.5 (L) 07/28/2018   NEUTROABS 1.5 (L) 07/28/2018   HGB  11.1 (L) 07/28/2018   HCT 34.3 (L) 07/28/2018   MCV 89.1 07/28/2018   PLT 255 07/28/2018  Chemistry      Component Value Date/Time   NA 140 07/28/2018 1047   NA 140 10/24/2017 1307   K 4.5 07/28/2018 1047   K 5.4 (H) 10/24/2017 1307   CL 107 07/28/2018 1047   CO2 25 07/28/2018 1047   CO2 29 10/24/2017 1307   BUN 12 07/28/2018 1047   BUN 18.2 10/24/2017 1307   CREATININE 0.77 07/28/2018 1047   CREATININE 0.8 10/24/2017 1307      Component Value Date/Time   CALCIUM 9.4 07/28/2018 1047   CALCIUM 10.3 10/24/2017 1307   ALKPHOS 120 07/28/2018 1047   ALKPHOS 115 10/24/2017 1307   AST 12 (L) 07/28/2018 1047   AST 9 10/24/2017 1307   ALT 10 07/28/2018 1047   ALT 8 10/24/2017 1307   BILITOT 0.3 07/28/2018 1047   BILITOT 0.28 10/24/2017 1307       No results found for: LABCA2  No components found for: LABCA125  No results for input(s): INR in the last 168 hours.  Urinalysis    Component Value Date/Time   COLORURINE STRAW (A) 10/30/2017 2050   APPEARANCEUR CLEAR 10/30/2017 2050   LABSPEC 1.006 10/30/2017 2050   [place] 6.0 10/30/2017 2050   GLUCOSEU NEGATIVE 10/30/2017 2050   [place] NEGATIVE 10/30/2017 2050   [place] NEGATIVE 10/30/2017 2050   [place] 10/30/2017 2050   PROTEINUR NEGATIVE 10/30/2017 2050   NITRITE NEGATIVE 10/30/2017 2050   LEUKOCYTESUR NEGATIVE 10/30/2017 2050     STUDIES: No results found.   ELIGIBLE FOR AVAILABLE RESEARCH PROTOCOL: no  ASSESSMENT: 62 y.o. [employer], [place] woman status post left breast upper inner quadrant biopsy 09/30/2016 for a clinical T2 N0, stage II a invasive ductal carcinoma, grade 3, estrogen receptor positive, progesterone receptor negative, HER-2 not amplified, with an MIB-1 of 80%.  (1) genetics testing pending  (2) neoadjuvant chemotherapy given between 10/30/2016 and 03/12/2017 consisting of cyclophosphamide and doxorubicin in dose dense fashion 4  followed by paclitaxel 1, then Abraxane  (x8?)  further treatments discontinued because of neuropathy.  (3) status post left lumpectomy with oncoplasty and right reduction mammoplasty showing a residual T2 N1 invasive ductal carcinoma, with lymphovascular invasion, but negative margins; the tumor is now HER-2 positive.  (a) postop PET scan showed no evidence of metastatic disease.  (4) adjuvant trastuzumab/pertuzumab started 06/20/2017 (received at [facility]), started here on 07/11/2017--continued through April 2019 when patient left for [place]  (a) Echo on 06/18/2017 at [facility]: LVEF 55-60%  (b) echocardiogram 09/26/2017 shows an ejection fraction in the 60-65% range.  (c) echocardiogram 12/26/2017 shows an ejection fraction in the 55-60% range  (5) adjuvant radiation was to follow, but see (7) below  (6) anti-estrogens: Letrozole daily started 03/26/2017  (a) DEXA on 04/03/2017 demonstrated a T score of -1.3 in the right femoral neck  (b) Zometa given at [facility] on 06/20/2017  RECURRENCE: (7) Patient developed left inner breast nodule and underwent mammogram and ultrasound on 08/06/17 showing three areas of concern at 10 o'clock, 1130 o'clock, and at 8 o'clock. Biopsy on 08/07/2017 demonstrated at 11:30 fat necrosis, however at 10:00 it was positive for IDC, grade 3, ER+(50%), PR-(0%), Ki-67 90%, HER-2 negative (ratio 1.48).  (a) staging chest CT 08/15/2017 shows 0.5 cm left lower lobe lung nodule of uncertain significance  (8) status post left modified radical mastectomy and right simple mastectomy 08/25/2017 showing  (a) right breast, no evidence of malignancy  (b) left breast, ypT2 ypN1 invasive ductal carcinoma, grade 3, with negative margins; repeat prognostic panel: Triple  negative  (9) started adjuvant chemotherapy with cyclophosphamide/methotrexate/fluorouracil [CMF] 11/10/2016, eight cycles planned  (a) continuing trastuzumab/Pertuzumab   (b) CMF chemotherapy discontinued after 4 cycles with disease progression  (10) status post  left chest wall biopsy for further local recurrence 01/05/2018, the tumor being now weakly estrogen receptor positive, progesterone receptor negative, and again HER-2 negative  (11) PET scan 03/25/2018 negative except for a 1.2 cm left internal mammary lymph node, SUV 6.9  (12) adjuvant radiation completed 03/19/2018  (a) did not receive capecitabine sensitization  (13)  Letrozole resumed 03/27/2018--discontinued 06/29/2018  (14) foundation one 01/09/2018 shows no actionable mutations, with stable microsatellite status and low mutational burden.  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  (a) PD-L1 testing on immune cells, 0% staining (negative)--obtained on 08/07/2017 cycle, which was weakly ER positive, progesterone and HER-2 negative  (15) trastuzumab and Pertuzumab resumed 03/31/2018  (a) echocardiogram on 04/20/2018 showed an ejection fraction in the 50-55% range.   (b) changed to every 4-week treatments beginning 07/01/2018 to coordinate with fulvestrant  (16) fine-needle aspiration of a chest wall lesion on 06/04/2018 was read as "non-small cell carcinoma".  Punch biopsy obtained at the visit 06/08/2018 for prognostic panel determination showed poorly differentiated carcinoma, estrogen receptor positive at 60%, progesterone receptor negative, HER-2 negative by immunohistochemistry at 1+  (17) fulvestrant started 07/01/2018, repeat every 28 days  (a) mid September started palbociclib 125 mg daily, 21/7,    PLAN:  Bryer is now receiving a complex treatment protocol that will cover her estrogen receptor positive and her HER-2 positive breast cancer phenotypes.  Specifically she will receive trastuzumab, Pertuzumab, and Faslodex every 28 days, with all those being given today.  She also will receive palbociclib 21 days on and 7 days off, with today being day 1 cycle 2.  Note of course that we are not covering the triple negative breast cancer.  That would require chemotherapy as it turns out her cancer  is PDL 1- and there are no increased mutations on foundation one.  And otherwise she does not qualify for pembrolizumab.  She is a little behind on her echocardiogram.  I have entered that order to be done within a week or so however she is planning to be out of town at [employer] for a week.  She is also planning to travel to [place] around Thanksgiving's.  I corrected the dates of her treatments.  She will be treated 4 weeks from now and then she will see me and be treated 09/24/2018, with her PET scan scheduled for 09/23/2018.  At that time we will be able to determine whether we are making any progress with the current treatment.  Note that her CA-27-29 is now mildly informative.  We will be repeating that on a monthly basis  She has a good understanding of the overall plan.  She agrees with it.  She knows the goal of treatment in her case is control.  She will call with any problems that may develop before the next visit.   [name], [name], MD  07/29/18 8:33 AM Medical Oncology and Hematology [facility] [address] [place], [place] [address] Tel. [phone]    Fax. [phone]  Alice Rieger, am acting as scribe for Chauncey Cruel MD.  I, Lurline Del MD, have reviewed the above documentation for accuracy and completeness, and I agree with the above.

## 2018-07-29 ENCOUNTER — Inpatient Hospital Stay (HOSPITAL_BASED_OUTPATIENT_CLINIC_OR_DEPARTMENT_OTHER): Payer: BLUE CROSS/BLUE SHIELD | Admitting: Oncology

## 2018-07-29 ENCOUNTER — Inpatient Hospital Stay: Payer: BLUE CROSS/BLUE SHIELD

## 2018-07-29 ENCOUNTER — Telehealth: Payer: Self-pay | Admitting: Oncology

## 2018-07-29 VITALS — BP 159/83 | HR 100 | Temp 98.4°F | Resp 18 | Ht 69.0 in | Wt 206.8 lb

## 2018-07-29 DIAGNOSIS — C7951 Secondary malignant neoplasm of bone: Secondary | ICD-10-CM

## 2018-07-29 DIAGNOSIS — Z170 Estrogen receptor positive status [ER+]: Secondary | ICD-10-CM

## 2018-07-29 DIAGNOSIS — C50912 Malignant neoplasm of unspecified site of left female breast: Secondary | ICD-10-CM

## 2018-07-29 DIAGNOSIS — C50212 Malignant neoplasm of upper-inner quadrant of left female breast: Secondary | ICD-10-CM

## 2018-07-29 DIAGNOSIS — Z5112 Encounter for antineoplastic immunotherapy: Secondary | ICD-10-CM | POA: Diagnosis not present

## 2018-07-29 DIAGNOSIS — Z95828 Presence of other vascular implants and grafts: Secondary | ICD-10-CM

## 2018-07-29 LAB — CANCER ANTIGEN 27.29: CAN 27.29: 60.7 U/mL — AB (ref 0.0–38.6)

## 2018-07-29 MED ORDER — SODIUM CHLORIDE 0.9 % IV SOLN
420.0000 mg | Freq: Once | INTRAVENOUS | Status: AC
  Administered 2018-07-29: 420 mg via INTRAVENOUS
  Filled 2018-07-29: qty 14

## 2018-07-29 MED ORDER — TRASTUZUMAB CHEMO 150 MG IV SOLR
6.0000 mg/kg | Freq: Once | INTRAVENOUS | Status: AC
  Administered 2018-07-29: 567 mg via INTRAVENOUS
  Filled 2018-07-29: qty 27

## 2018-07-29 MED ORDER — ACETAMINOPHEN 325 MG PO TABS
650.0000 mg | ORAL_TABLET | Freq: Once | ORAL | Status: AC
  Administered 2018-07-29: 650 mg via ORAL

## 2018-07-29 MED ORDER — SODIUM CHLORIDE 0.9 % IV SOLN
Freq: Once | INTRAVENOUS | Status: AC
  Administered 2018-07-29: 09:00:00 via INTRAVENOUS
  Filled 2018-07-29: qty 250

## 2018-07-29 MED ORDER — DIPHENHYDRAMINE HCL 25 MG PO CAPS
ORAL_CAPSULE | ORAL | Status: AC
  Filled 2018-07-29: qty 1

## 2018-07-29 MED ORDER — ACETAMINOPHEN 325 MG PO TABS
ORAL_TABLET | ORAL | Status: AC
  Filled 2018-07-29: qty 2

## 2018-07-29 MED ORDER — SODIUM CHLORIDE 0.9 % IV SOLN: 840.0000 mg | Freq: Once | INTRAVENOUS | Status: DC

## 2018-07-29 MED ORDER — HEPARIN SOD (PORK) LOCK FLUSH 100 UNIT/ML IV SOLN
500.0000 [IU] | Freq: Once | INTRAVENOUS | Status: AC | PRN
  Administered 2018-07-29: 500 [IU]
  Filled 2018-07-29: qty 5

## 2018-07-29 MED ORDER — FULVESTRANT 250 MG/5ML IM SOLN
500.0000 mg | Freq: Once | INTRAMUSCULAR | Status: AC
  Administered 2018-07-29: 500 mg via INTRAMUSCULAR

## 2018-07-29 MED ORDER — FULVESTRANT 250 MG/5ML IM SOLN
INTRAMUSCULAR | Status: AC
  Filled 2018-07-29: qty 5

## 2018-07-29 MED ORDER — DIPHENHYDRAMINE HCL 25 MG PO CAPS
25.0000 mg | ORAL_CAPSULE | Freq: Once | ORAL | Status: AC
  Administered 2018-07-29: 25 mg via ORAL

## 2018-07-29 MED ORDER — TRASTUZUMAB CHEMO 150 MG IV SOLR: 8.0000 mg/kg | Freq: Once | INTRAVENOUS | Status: DC

## 2018-07-29 MED ORDER — SODIUM CHLORIDE 0.9% FLUSH
10.0000 mL | INTRAVENOUS | Status: DC | PRN
  Administered 2018-07-29: 10 mL
  Filled 2018-07-29: qty 10

## 2018-07-29 NOTE — Telephone Encounter (Signed)
Gave pt avs and calendar  °

## 2018-07-29 NOTE — Patient Instructions (Signed)
[facility] Discharge Instructions for Patients Receiving Chemotherapy  Today you received the following chemotherapy agents: Herceptin, Perjeta, Faslodex  To help prevent nausea and vomiting after your treatment, we encourage you to take your nausea medication as directed.   If you develop nausea and vomiting that is not controlled by your nausea medication, call the clinic.   BELOW ARE SYMPTOMS THAT SHOULD BE REPORTED IMMEDIATELY:  *FEVER GREATER THAN 100.5 F  *CHILLS WITH OR WITHOUT FEVER  NAUSEA AND VOMITING THAT IS NOT CONTROLLED WITH YOUR NAUSEA MEDICATION  *UNUSUAL SHORTNESS OF BREATH  *UNUSUAL BRUISING OR BLEEDING  TENDERNESS IN MOUTH AND THROAT WITH OR WITHOUT PRESENCE OF ULCERS  *URINARY PROBLEMS  *BOWEL PROBLEMS  UNUSUAL RASH Items with * indicate a potential emergency and should be followed up as soon as possible.  Feel free to call the clinic should you have any questions or concerns. The clinic phone number is [phone].  Please show the [facility] at check-in to the Emergency Department and triage nurse.   Zoledronic Acid injection (Hypercalcemia, Oncology) What is this medicine? ZOLEDRONIC ACID (ZOE le dron ik AS id) lowers the amount of calcium loss from bone. It is used to treat too much calcium in your blood from cancer. It is also used to prevent complications of cancer that has spread to the bone. This medicine may be used for other purposes; ask your health care provider or pharmacist if you have questions. COMMON BRAND NAME(S): Zometa What should I tell my health care provider before I take this medicine? They need to know if you have any of these conditions: -aspirin-sensitive asthma -cancer, especially if you are receiving medicines used to treat cancer -dental disease or wear dentures -infection -kidney disease -receiving corticosteroids like dexamethasone or prednisone -an unusual or allergic reaction to zoledronic  acid, other medicines, foods, dyes, or preservatives -pregnant or trying to get pregnant -breast-feeding How should I use this medicine? This medicine is for infusion into a vein. It is given by a health care professional in a hospital or clinic setting. Talk to your pediatrician regarding the use of this medicine in children. Special care may be needed. Overdosage: If you think you have taken too much of this medicine contact a poison control center or emergency room at once. NOTE: This medicine is only for you. Do not share this medicine with others. What if I miss a dose? It is important not to miss your dose. Call your doctor or health care professional if you are unable to keep an appointment. What may interact with this medicine? -certain antibiotics given by injection -NSAIDs, medicines for pain and inflammation, like ibuprofen or naproxen -some diuretics like bumetanide, furosemide -teriparatide -thalidomide This list may not describe all possible interactions. Give your health care provider a list of all the medicines, herbs, non-prescription drugs, or dietary supplements you use. Also tell them if you smoke, drink alcohol, or use illegal drugs. Some items may interact with your medicine. What should I watch for while using this medicine? Visit your doctor or health care professional for regular checkups. It may be some time before you see the benefit from this medicine. Do not stop taking your medicine unless your doctor tells you to. Your doctor may order blood tests or other tests to see how you are doing. Women should inform their doctor if they wish to become pregnant or think they might be pregnant. There is a potential for serious side effects to an unborn  child. Talk to your health care professional or pharmacist for more information. You should make sure that you get enough calcium and vitamin D while you are taking this medicine. Discuss the foods you eat and the vitamins you  take with your health care professional. Some people who take this medicine have severe bone, joint, and/or muscle pain. This medicine may also increase your risk for jaw problems or a broken thigh bone. Tell your doctor right away if you have severe pain in your jaw, bones, joints, or muscles. Tell your doctor if you have any pain that does not go away or that gets worse. Tell your dentist and dental surgeon that you are taking this medicine. You should not have major dental surgery while on this medicine. See your dentist to have a dental exam and fix any dental problems before starting this medicine. Take good care of your teeth while on this medicine. Make sure you see your dentist for regular follow-up appointments. What side effects may I notice from receiving this medicine? Side effects that you should report to your doctor or health care professional as soon as possible: -allergic reactions like skin rash, itching or hives, swelling of the face, lips, or tongue -anxiety, confusion, or depression -breathing problems -changes in vision -eye pain -feeling faint or lightheaded, falls -jaw pain, especially after dental work -mouth sores -muscle cramps, stiffness, or weakness -redness, blistering, peeling or loosening of the skin, including inside the mouth -trouble passing urine or change in the amount of urine Side effects that usually do not require medical attention (report to your doctor or health care professional if they continue or are bothersome): -bone, joint, or muscle pain -constipation -diarrhea -fever -hair loss -irritation at site where injected -loss of appetite -nausea, vomiting -stomach upset -trouble sleeping -trouble swallowing -weak or tired This list may not describe all possible side effects. Call your doctor for medical advice about side effects. You may report side effects to FDA at 1-800-FDA-1088. Where should I keep my medicine? This drug is given in a  hospital or clinic and will not be stored at home. NOTE: This sheet is a summary. It may not cover all possible information. If you have questions about this medicine, talk to your doctor, pharmacist, or health care provider.  2018 Elsevier/Gold Standard (2014-03-05 14:19:39)

## 2018-07-29 NOTE — Progress Notes (Signed)
Per Dr Jana Hakim OK for treatment today with echo from 04/20/18

## 2018-07-29 NOTE — Progress Notes (Signed)
Pt declined to stay for 30 minute observation period post perjeta infusion. Pt educated on the importance of the observation period and reminded to call or come back if she starts to show signs of a reaction.  Pt given AVS with information on it.

## 2018-07-29 NOTE — Progress Notes (Signed)
Chemo doses clarified w/ MD today.  He would like pt to be on Herceptin 6 mg/kg and Perjeta 420 mg monthly. Orders updated. Kennith Center, Pharm.D., CPP 07/29/2018@10 :13 AM

## 2018-08-07 ENCOUNTER — Ambulatory Visit: Payer: BLUE CROSS/BLUE SHIELD

## 2018-08-07 ENCOUNTER — Other Ambulatory Visit: Payer: BLUE CROSS/BLUE SHIELD

## 2018-08-07 ENCOUNTER — Other Ambulatory Visit: Payer: Self-pay | Admitting: Oncology

## 2018-08-07 DIAGNOSIS — C50212 Malignant neoplasm of upper-inner quadrant of left female breast: Secondary | ICD-10-CM

## 2018-08-07 DIAGNOSIS — Z170 Estrogen receptor positive status [ER+]: Principal | ICD-10-CM

## 2018-08-12 ENCOUNTER — Ambulatory Visit: Payer: BLUE CROSS/BLUE SHIELD | Admitting: Oncology

## 2018-08-12 ENCOUNTER — Other Ambulatory Visit: Payer: BLUE CROSS/BLUE SHIELD

## 2018-08-12 ENCOUNTER — Ambulatory Visit: Payer: BLUE CROSS/BLUE SHIELD

## 2018-08-17 ENCOUNTER — Ambulatory Visit (HOSPITAL_COMMUNITY): Payer: BLUE CROSS/BLUE SHIELD | Attending: Cardiology

## 2018-08-17 ENCOUNTER — Other Ambulatory Visit: Payer: Self-pay

## 2018-08-17 DIAGNOSIS — C50212 Malignant neoplasm of upper-inner quadrant of left female breast: Secondary | ICD-10-CM | POA: Insufficient documentation

## 2018-08-17 DIAGNOSIS — C50912 Malignant neoplasm of unspecified site of left female breast: Secondary | ICD-10-CM | POA: Insufficient documentation

## 2018-08-17 DIAGNOSIS — Z170 Estrogen receptor positive status [ER+]: Secondary | ICD-10-CM | POA: Diagnosis not present

## 2018-08-25 MED FILL — IBRANCE 125 MG CAPSULE: 125 | 28 days supply | Qty: 21 | Fill #2

## 2018-08-26 ENCOUNTER — Inpatient Hospital Stay: Payer: BLUE CROSS/BLUE SHIELD

## 2018-08-26 ENCOUNTER — Inpatient Hospital Stay: Payer: BLUE CROSS/BLUE SHIELD | Attending: Oncology

## 2018-08-26 VITALS — BP 129/90 | HR 85 | Temp 98.3°F | Resp 18

## 2018-08-26 DIAGNOSIS — Z5112 Encounter for antineoplastic immunotherapy: Secondary | ICD-10-CM | POA: Insufficient documentation

## 2018-08-26 DIAGNOSIS — Z5111 Encounter for antineoplastic chemotherapy: Secondary | ICD-10-CM | POA: Diagnosis present

## 2018-08-26 DIAGNOSIS — Z170 Estrogen receptor positive status [ER+]: Secondary | ICD-10-CM

## 2018-08-26 DIAGNOSIS — C50212 Malignant neoplasm of upper-inner quadrant of left female breast: Secondary | ICD-10-CM | POA: Insufficient documentation

## 2018-08-26 DIAGNOSIS — C50912 Malignant neoplasm of unspecified site of left female breast: Secondary | ICD-10-CM

## 2018-08-26 DIAGNOSIS — Z95828 Presence of other vascular implants and grafts: Secondary | ICD-10-CM

## 2018-08-26 LAB — CBC WITH DIFFERENTIAL/PLATELET
Abs Immature Granulocytes: 0.01 10*3/uL (ref 0.00–0.07)
Basophils Absolute: 0.1 10*3/uL (ref 0.0–0.1)
Basophils Relative: 3 %
Eosinophils Absolute: 0.1 10*3/uL (ref 0.0–0.5)
Eosinophils Relative: 4 %
HCT: 35.5 % — ABNORMAL LOW (ref 36.0–46.0)
HEMOGLOBIN: 11.5 g/dL — AB (ref 12.0–15.0)
Immature Granulocytes: 0 %
LYMPHS PCT: 20 %
Lymphs Abs: 0.6 10*3/uL — ABNORMAL LOW (ref 0.7–4.0)
MCH: 30.3 pg (ref 26.0–34.0)
MCHC: 32.4 g/dL (ref 30.0–36.0)
MCV: 93.4 fL (ref 80.0–100.0)
MONO ABS: 0.4 10*3/uL (ref 0.1–1.0)
MONOS PCT: 14 %
NEUTROS ABS: 1.6 10*3/uL — AB (ref 1.7–7.7)
Neutrophils Relative %: 59 %
Platelets: 223 10*3/uL (ref 150–400)
RBC: 3.8 MIL/uL — AB (ref 3.87–5.11)
RDW: 20.4 % — ABNORMAL HIGH (ref 11.5–15.5)
WBC: 2.7 10*3/uL — AB (ref 4.0–10.5)
nRBC: 0 % (ref 0.0–0.2)

## 2018-08-26 LAB — COMPREHENSIVE METABOLIC PANEL
ALBUMIN: 4 g/dL (ref 3.5–5.0)
ALT: 10 U/L (ref 0–44)
AST: 12 U/L — AB (ref 15–41)
Alkaline Phosphatase: 121 U/L (ref 38–126)
Anion gap: 10 (ref 5–15)
BUN: 15 mg/dL (ref 8–23)
CHLORIDE: 106 mmol/L (ref 98–111)
CO2: 25 mmol/L (ref 22–32)
Calcium: 9.5 mg/dL (ref 8.9–10.3)
Creatinine, Ser: 0.81 mg/dL (ref 0.44–1.00)
GFR calc Af Amer: >60 mL/min (ref >60)
GFR calc non Af Amer: >60 mL/min (ref >60)
GLUCOSE: 103 mg/dL — AB (ref 70–99)
POTASSIUM: 4.5 mmol/L (ref 3.5–5.1)
Sodium: 141 mmol/L (ref 135–145)
Total Bilirubin: 0.2 mg/dL — ABNORMAL LOW (ref 0.3–1.2)
Total Protein: 6.8 g/dL (ref 6.5–8.1)

## 2018-08-26 MED ORDER — DIPHENHYDRAMINE HCL 25 MG PO CAPS
25.0000 mg | ORAL_CAPSULE | Freq: Once | ORAL | Status: AC
  Administered 2018-08-26: 25 mg via ORAL

## 2018-08-26 MED ORDER — SODIUM CHLORIDE 0.9 % IV SOLN
420.0000 mg | Freq: Once | INTRAVENOUS | Status: AC
  Administered 2018-08-26: 420 mg via INTRAVENOUS
  Filled 2018-08-26: qty 14

## 2018-08-26 MED ORDER — TRASTUZUMAB CHEMO 150 MG IV SOLR
6.0000 mg/kg | Freq: Once | INTRAVENOUS | Status: AC
  Administered 2018-08-26: 567 mg via INTRAVENOUS
  Filled 2018-08-26: qty 27

## 2018-08-26 MED ORDER — FULVESTRANT 250 MG/5ML IM SOLN
INTRAMUSCULAR | Status: AC
  Filled 2018-08-26: qty 5

## 2018-08-26 MED ORDER — FULVESTRANT 250 MG/5ML IM SOLN
500.0000 mg | Freq: Once | INTRAMUSCULAR | Status: AC
  Administered 2018-08-26: 500 mg via INTRAMUSCULAR

## 2018-08-26 MED ORDER — ACETAMINOPHEN 325 MG PO TABS
650.0000 mg | ORAL_TABLET | Freq: Once | ORAL | Status: AC
  Administered 2018-08-26: 650 mg via ORAL

## 2018-08-26 MED ORDER — DIPHENHYDRAMINE HCL 25 MG PO CAPS
ORAL_CAPSULE | ORAL | Status: AC
  Filled 2018-08-26: qty 1

## 2018-08-26 MED ORDER — ACETAMINOPHEN 325 MG PO TABS
ORAL_TABLET | ORAL | Status: AC
  Filled 2018-08-26: qty 2

## 2018-08-26 MED ORDER — SODIUM CHLORIDE 0.9 % IV SOLN
Freq: Once | INTRAVENOUS | Status: AC
  Administered 2018-08-26: 10:00:00 via INTRAVENOUS
  Filled 2018-08-26: qty 250

## 2018-08-26 MED ORDER — SODIUM CHLORIDE 0.9% FLUSH
10.0000 mL | INTRAVENOUS | Status: DC | PRN
  Administered 2018-08-26: 10 mL
  Filled 2018-08-26: qty 10

## 2018-08-26 MED ORDER — HEPARIN SOD (PORK) LOCK FLUSH 100 UNIT/ML IV SOLN
500.0000 [IU] | Freq: Once | INTRAVENOUS | Status: AC | PRN
  Administered 2018-08-26: 500 [IU]
  Filled 2018-08-26: qty 5

## 2018-08-26 NOTE — Patient Instructions (Signed)
Cancer Center Discharge Instructions for Patients Receiving Chemotherapy  Today you received the following chemotherapy agents: Herceptin, Perjeta  To help prevent nausea and vomiting after your treatment, we encourage you to take your nausea medication as directed.   If you develop nausea and vomiting that is not controlled by your nausea medication, call the clinic.   BELOW ARE SYMPTOMS THAT SHOULD BE REPORTED IMMEDIATELY:  *FEVER GREATER THAN 100.5 F  *CHILLS WITH OR WITHOUT FEVER  NAUSEA AND VOMITING THAT IS NOT CONTROLLED WITH YOUR NAUSEA MEDICATION  *UNUSUAL SHORTNESS OF BREATH  *UNUSUAL BRUISING OR BLEEDING  TENDERNESS IN MOUTH AND THROAT WITH OR WITHOUT PRESENCE OF ULCERS  *URINARY PROBLEMS  *BOWEL PROBLEMS  UNUSUAL RASH Items with * indicate a potential emergency and should be followed up as soon as possible.  Feel free to call the clinic should you have any questions or concerns. The clinic phone number is [phone].  Please show the CHEMO ALERT CARD at check-in to the Emergency Department and triage nurse.   

## 2018-08-27 LAB — CANCER ANTIGEN 27.29: CAN 27.29: 71.6 U/mL — AB (ref 0.0–38.6)

## 2018-08-29 ENCOUNTER — Other Ambulatory Visit: Payer: Self-pay | Admitting: Nurse Practitioner

## 2018-09-14 ENCOUNTER — Other Ambulatory Visit: Payer: Self-pay | Admitting: Oncology

## 2018-09-16 ENCOUNTER — Ambulatory Visit: Payer: BLUE CROSS/BLUE SHIELD

## 2018-09-16 ENCOUNTER — Other Ambulatory Visit: Payer: Self-pay | Admitting: Nurse Practitioner

## 2018-09-16 ENCOUNTER — Other Ambulatory Visit: Payer: BLUE CROSS/BLUE SHIELD

## 2018-09-22 MED FILL — IBRANCE 125 MG CAPSULE: 125 | 28 days supply | Qty: 21 | Fill #3

## 2018-09-23 ENCOUNTER — Ambulatory Visit (HOSPITAL_COMMUNITY)
Admission: RE | Admit: 2018-09-23 | Discharge: 2018-09-23 | Disposition: A | Discharge status: Home / Self Care | Payer: BLUE CROSS/BLUE SHIELD | Source: Ambulatory Visit | Attending: Oncology | Admitting: Oncology

## 2018-09-23 DIAGNOSIS — C50212 Malignant neoplasm of upper-inner quadrant of left female breast: Secondary | ICD-10-CM | POA: Diagnosis present

## 2018-09-23 DIAGNOSIS — Z170 Estrogen receptor positive status [ER+]: Secondary | ICD-10-CM

## 2018-09-23 DIAGNOSIS — C50912 Malignant neoplasm of unspecified site of left female breast: Secondary | ICD-10-CM | POA: Diagnosis not present

## 2018-09-23 LAB — GLUCOSE, CAPILLARY: Glucose-Capillary: 117 mg/dL — ABNORMAL HIGH (ref 70–99)

## 2018-09-23 MED ORDER — FLUDEOXYGLUCOSE F - 18 (FDG) INJECTION
10.4000 | Freq: Once | INTRAVENOUS | Status: AC
  Administered 2018-09-23: 10.4 via INTRAVENOUS

## 2018-09-23 NOTE — Progress Notes (Signed)
[facility]  [phone] [phone]     ID: [name] DOB: 1956/05/01  MR#: [id number]  LHT#:[id number]  Patient Care Team: Chesley Noon, MD as PCP - General (Family Medicine) Excell Seltzer, MD as Consulting Physician (General Surgery) [name], [name], MD as Consulting Physician (Oncology) Gery Pray, MD as Consulting Physician (Radiation Oncology) Collene Gobble, MD as Consulting Physician (Pulmonary Disease) Verdell Carmine, MD as Referring Physician (Internal Medicine) Larey Dresser, MD as Consulting Physician (Cardiology) Beverely Pace, MD as Referring Physician (Surgical Oncology) OTHER MD: Dr Lyda Jester [phone])  CHIEF COMPLAINT: estrogen receptor positive, HER-2 positive breast cancer; 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 She is accompanied by her husband.  The patient has been receiving trastuzumab/pertuzumab, given every 28 days, with a dose due today.  Which is she tolerating well.   Her most recent echocardiogram was 08/17/2018 and showed an ejection fraction in the 60-65%  The patient also continues on zolendronate every 12 weeks, with her most recent dose 07/02/2018, with a dose due today.  She is tolerating the treatment well. She is taking Tums for extra Calcium in addition to this treatment.   In addition, the patient also continues on Faslodex/fulvestrant, received every four weeks, with a dose due today as well.  Which she is tolerating well.   Finally, the patient also continues on Ibrance/palbociclib, 125 mg, 21 days on and 7 days off. Today is day 1 cycle 4. She is tolerating the treatment well. She does experience diarrhea, but no nausea. She experienced more fatigue in the second month than  in the first.  Since her last visit here, she underwent a skull base to thigh PET scan on 09/23/2018, showing:  1. Mild to moderate disease progression. Although some disease sites have improved (presternal chest wall and right internal mammary nodal station), there is progressive hypermetabolic adenopathy within the mediastinum, right hilum and a new isolated sternal osseous metastasis. 2. Bibasilar new hypermetabolic pulmonary opacities. Favor a somewhat atypical vague appearance of pulmonary metastasis. Infectious or inflammatory etiologies (i.e. Drug toxicity) could look similar. Evolving radiation induced consolidation within the subpleural left upper lobe. 3. No soft tissue metastasis identified within the neck, abdomen, or pelvis.  She is here today to discuss those results  REVIEW OF SYSTEMS: [name] is doing well overall. Her water well has gone out about a month ago leaving with without running water. She is having foot cramps at night. She has been taking care of her grandchild, but she has not done much exercise otherwise. The patient denies unusual headaches, visual changes, nausea, vomiting, or dizziness. There has been no unusual cough, phlegm production, or pleurisy. This been no change in bowel or bladder habits. The patient denies unexplained fatigue or unexplained weight loss, bleeding, rash, or fever. A detailed review of systems was otherwise noncontributory.    BREAST CANCER HISTORY: From the original intake note:  "[name]" had a fall while traveling living to [place] in their RV and injure her left upper arm and  her left breast . Approximately 3 weeks later she noted a mass in the left breast upper inner quadrant. It was somewhat tender. She brought this to medical attention and on 09/27/2016 underwent bilateral diagnostic mammography with tomography and left breast [facility]. The breast density was category B. The right  breast was benign. In the left  breast upper inner quadrant there was a large lobulated hyperdense mass which by exam was firm and nonmobile. By ultrasonography in the 11:30 o'clock radius 4 cm from the nipple there was a mass extending to the subcutaneous tissues and measuring 4.9 cm. There was a small superficial fluid collection overlying this consistent with a hematoma. The left axilla was benign.  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  The patient's subsequent history is as detailed below   PAST MEDICAL HISTORY: Past Medical History:  Diagnosis Date  . Anxiety   . CAP (community acquired pneumonia)   . Depression   . Malignant neoplasm of upper-inner quadrant of left female breast ([place]) 10/01/2016  . Personal history of chemotherapy   . Tobacco use     PAST SURGICAL HISTORY: Past Surgical History:  Procedure Laterality Date  . ABDOMINAL HYSTERECTOMY  2011   Laparoscopic  . AUGMENTATION MAMMAPLASTY Bilateral 03/2017  . BREAST LUMPECTOMY Left 04/05/2017  . BREAST LUMPECTOMY WITH SENTINEL LYMPH NODE BIOPSY Left 04/07/2017  . BREAST REDUCTION SURGERY Left 04/07/2017  . CHOLECYSTECTOMY    . MASTECTOMY Bilateral 08/25/2017   at [facility]  . open reduction left ankle    . SKIN GRAFT Left 06/19/2017   bil skin grafts to breasts    FAMILY HISTORY Family History  Problem Relation Age of Onset  . Heart disease Mother   . Heart disease Father   . Cancer Sister        ovarian ca  the patient's father died at the age of 20 from a myocardial infarction. The patient's mother died at the age of 82 with congestive heart failure. The patient had one brother, 2 sisters. One of her sisters was diagnosed with ovarian cancer at age 32 and  died at age 10 from that disease  GYNECOLOGIC HISTORY:  No LMP recorded. Patient has had a hysterectomy. Menarche age 82, first live birth age 74. The patient is GX P1. She stopped having periods approximately 2008. She did not take hormone replacement. She used oral contraceptives for approximately 30 years with no complications.  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 Her husband [name] is retired from [employer]. Their daughter Pheobe Sandiford lives in [place] and is Mudlogger of the [employer] in [employer] and her husband works as an Sales executive at [employer]. The patient welcomed her 1st grandchild, a boy named Manfredi in May 2019.      ADVANCED DIRECTIVES: not in place   HEALTH MAINTENANCE: Social History   Tobacco Use  . Smoking status: Former Smoker    Packs/day: 0.50    Years: 20.00    Pack years: 10.00    Last attempt to quit: 10/21/2016    Years since quitting: 1.9  . Smokeless tobacco: Never Used  Substance Use Topics  . Alcohol use: No  . Drug use: No     Colonoscopy:  PAP:  Bone density:   No Known Allergies  Current Outpatient Medications  Medication Sig Dispense Refill  . acetaminophen (TYLENOL) 325 MG tablet Take 650 mg by mouth every 6 (six) hours as needed for moderate pain.     Marland Kitchen  ALPRAZolam (XANAX XR) 1 MG 24 hr tablet TAKE 1 TABLET BY MOUTH EVERY DAY 30 tablet 1  . busPIRone (BUSPAR) 30 MG tablet Take 0.5 tablets (15 mg total) by mouth daily. 90 tablet 4  . diphenhydrAMINE (BENADRYL) 25 MG tablet Take 50 mg by mouth as needed for sleep.     Marland Kitchen ibuprofen (ADVIL,MOTRIN) 200 MG tablet Take 600 mg by mouth every 6 (six) hours as needed for moderate pain.     . palbociclib (IBRANCE) 125 MG capsule Take 1 capsule (125 mg total) by mouth daily with breakfast. Take whole with food. Take for 21 days on, 7 days off, repeat every 28 days. 21 capsule 4  . PARoxetine (PAXIL) 40 MG tablet Take 40 mg  by mouth every morning.    . ranitidine (ZANTAC) 150 MG tablet Take 150 mg by mouth daily as needed for heartburn.     Marland Kitchen tiZANidine (ZANAFLEX) 4 MG tablet TAKE 1 TABLET BY MOUTH Bedtime    . valACYclovir (VALTREX) 1000 MG tablet Take 1 tablet (1,000 mg total) by mouth daily. 90 tablet 2   No current facility-administered medications for this visit.      OBJECTIVE: Middle-aged white woman in no acute distress Vitals:   09/24/18 0806  BP: (!) 163/83  Pulse: 96  Resp: 17  Temp: 98.6 F (37 C)  SpO2: 100%     Body mass index is 30.51 kg/m.   Filed Weights   09/24/18 0806  Weight: 206 lb 9.6 oz (93.7 kg)     ECOG FS:1 - Symptomatic but completely ambulatory   Sclerae unicteric, pupils round and equal No cervical or supraclavicular adenopathy Lungs no rales or rhonchi Heart regular rate and rhythm Abd soft, nontender, positive bowel sounds MSK no focal spinal tenderness, no upper extremity lymphedema Neuro: nonfocal, well oriented, appropriate affect Breasts: That is post bilateral mastectomies.  The chest wall is imaged below.  While there significant scarring secondary to surgery and radiation I do not see any palpable or visible evidence of chest wall activity.  Both axillae are benign.    Chest Wall 09/24/2018 (the right side is up on this photo)     Left Chest wall 04/22/2018    Chest wall 06/29/2018 Chest Wall 06/29/2018    LAB RESULTS:  CMP     Component Value Date/Time   NA 143 09/24/2018 0747   NA 140 10/24/2017 1307   K 4.1 09/24/2018 0747   K 5.4 (H) 10/24/2017 1307   CL 108 09/24/2018 0747   CO2 25 09/24/2018 0747   CO2 29 10/24/2017 1307   GLUCOSE 130 (H) 09/24/2018 0747   GLUCOSE 103 10/24/2017 1307   BUN 14 09/24/2018 0747   BUN 18.2 10/24/2017 1307   CREATININE 0.93 09/24/2018 0747   CREATININE 0.8 10/24/2017 1307   CALCIUM 9.3 09/24/2018 0747   CALCIUM 10.3 10/24/2017 1307   PROT 6.9 09/24/2018 0747   PROT 7.5 10/24/2017 1307    ALBUMIN 3.9 09/24/2018 0747   ALBUMIN 4.0 10/24/2017 1307   AST 15 09/24/2018 0747   AST 9 10/24/2017 1307   ALT 10 09/24/2018 0747   ALT 8 10/24/2017 1307   ALKPHOS 116 09/24/2018 0747   ALKPHOS 115 10/24/2017 1307   BILITOT 0.4 09/24/2018 0747   BILITOT 0.28 10/24/2017 1307   GFRNONAA >60 09/24/2018 0747   GFRAA >60 09/24/2018 0747    INo results found for: SPEP, UPEP  Lab Results  Component Value Date   WBC 3.0 (L) 09/24/2018  NEUTROABS 1.8 09/24/2018   HGB 11.0 (L) 09/24/2018   HCT 32.9 (L) 09/24/2018   MCV 99.1 09/24/2018   PLT 242 09/24/2018      Chemistry      Component Value Date/Time   NA 143 09/24/2018 0747   NA 140 10/24/2017 1307   K 4.1 09/24/2018 0747   K 5.4 (H) 10/24/2017 1307   CL 108 09/24/2018 0747   CO2 25 09/24/2018 0747   CO2 29 10/24/2017 1307   BUN 14 09/24/2018 0747   BUN 18.2 10/24/2017 1307   CREATININE 0.93 09/24/2018 0747   CREATININE 0.8 10/24/2017 1307      Component Value Date/Time   CALCIUM 9.3 09/24/2018 0747   CALCIUM 10.3 10/24/2017 1307   ALKPHOS 116 09/24/2018 0747   ALKPHOS 115 10/24/2017 1307   AST 15 09/24/2018 0747   AST 9 10/24/2017 1307   ALT 10 09/24/2018 0747   ALT 8 10/24/2017 1307   BILITOT 0.4 09/24/2018 0747   BILITOT 0.28 10/24/2017 1307       No results found for: LABCA2  No components found for: LABCA125  No results for input(s): INR in the last 168 hours.  Urinalysis    Component Value Date/Time   COLORURINE STRAW (A) 10/30/2017 2050   APPEARANCEUR CLEAR 10/30/2017 2050   LABSPEC 1.006 10/30/2017 2050   [place] 6.0 10/30/2017 2050   GLUCOSEU NEGATIVE 10/30/2017 2050   [place] NEGATIVE 10/30/2017 2050   [place] NEGATIVE 10/30/2017 2050   [place] 10/30/2017 2050   PROTEINUR NEGATIVE 10/30/2017 2050   NITRITE NEGATIVE 10/30/2017 2050   LEUKOCYTESUR NEGATIVE 10/30/2017 2050     STUDIES: Nm Pet Image Restag (ps) Skull Base To Thigh  Result Date: 09/23/2018 CLINICAL DATA:   Subsequent treatment strategy for recurrent left-sided breast cancer. Attention to internal mammary node. Staging. EXAM: NUCLEAR MEDICINE PET SKULL BASE TO THIGH TECHNIQUE: 10.4 mCi F-18 FDG was injected intravenously. Full-ring PET imaging was performed from the skull base to thigh after the radiotracer. CT data was obtained and used for attenuation correction and anatomic localization. Fasting blood glucose: 117 mg/dl COMPARISON:  06/26/2018 FINDINGS: Mediastinal blood pool activity: SUV max 2.3 NECK: No areas of abnormal hypermetabolism. Incidental CT findings: No cervical adenopathy. Right maxillary sinus mucous retention cyst or polyp. Hypoplastic right frontal sinus. CHEST: Left internal mammary node measures 1.2 cm and a S.U.V. max of 3.7 versus 1.1 cm and a S.U.V. max of 2.7 on the prior. Presternal/chest wall metastasis again identified. The largest is anterior to the inferior right sternum at 8 mm and a S.U.V. max of 3.7 on image 82/4. Compare 10 mm and a S.U.V. max of 4.7 on the prior exam. The previously described right internal mammary hypermetabolic node is a longer identified. However, there are increasingly hypermetabolic mediastinal and right hilar hypermetabolic nodes. An index node within the azygoesophageal recess measures 1.5 cm and a S.U.V. max of 9.2 on image 71/4. This node measured 1.0 cm and a S.U.V. max of 3.9 on the prior. Presumed radiation induced hypermetabolic consolidation within the left upper lobe, slightly progressive. Right lower lobe subpleural pulmonary opacities are somewhat vague including a hypermetabolic density at a S.U.V. max of 5.4 on image 34/8, new. Lateral left lower lobe subpleural pulmonary opacity with hypermetabolism at a S.U.V. max of 4.3 on image 41/8, new. Incidental CT findings: Pectus excavatum deformity. Right Port-A-Cath tip low SVC. ABDOMEN/PELVIS: No abdominopelvic parenchymal or nodal hypermetabolism. Incidental CT findings: Cholecystectomy. Normal  adrenal glands. Abdominal aortic atherosclerosis. Extensive  colonic diverticulosis. Hysterectomy. SKELETON: New hypermetabolic sclerotic lesion within the sternum. 1.3 cm and a S.U.V. max of 4.6 on image 73/4. Incidental CT findings: Bilateral mastectomy. IMPRESSION: 1. Mild to moderate disease progression. Although some disease sites have improved (presternal chest wall and right internal mammary nodal station), there is progressive hypermetabolic adenopathy within the mediastinum, right hilum and a new isolated sternal osseous metastasis. 2. Bibasilar new hypermetabolic pulmonary opacities. Favor a somewhat atypical vague appearance of pulmonary metastasis. Infectious or inflammatory etiologies (i.e. Drug toxicity) could look similar. Evolving radiation induced consolidation within the subpleural left upper lobe. 3. No soft tissue metastasis identified within the neck, abdomen, or pelvis. Electronically Signed   By: Abigail Miyamoto M.D.   On: 09/23/2018 12:22     ELIGIBLE FOR AVAILABLE RESEARCH PROTOCOL: no  ASSESSMENT: 62 y.o. [name], [name] woman status post left breast upper inner quadrant biopsy 09/30/2016 for a clinical T2 N0, stage II a invasive ductal carcinoma, grade 3, estrogen receptor positive, progesterone receptor negative, HER-2 not amplified, with an MIB-1 of 80%.  (1) genetics testing pending  (2) neoadjuvant chemotherapy given between 10/30/2016 and 03/12/2017 consisting of cyclophosphamide and doxorubicin in dose dense fashion 4  followed by paclitaxel 1, then Abraxane (x8?)  further treatments discontinued because of neuropathy.  (3) status post left lumpectomy with oncoplasty and right reduction mammoplasty showing a residual T2 N1 invasive ductal carcinoma, with lymphovascular invasion, but negative margins; the tumor is now HER-2 positive.  (a) postop PET scan showed no evidence of metastatic disease.  (4) adjuvant trastuzumab/pertuzumab started 06/20/2017 (received at  [facility]), started here on 07/11/2017--continued through April 2019 when patient left for [place]  (a) Echo on 06/18/2017 at [facility]: LVEF 55-60%  (b) echocardiogram 09/26/2017 shows an ejection fraction in the 60-65% range.  (c) echocardiogram 12/26/2017 shows an ejection fraction in the 55-60% range  (d) see below for subsequent echocardiogram results   (5) adjuvant radiation was to follow, but see (7) below  (6) anti-estrogens: Letrozole daily started 03/26/2017  (a) DEXA on 04/03/2017 demonstrated a T score of -1.3 in the right femoral neck  (b) Zometa given at [facility] on 06/20/2017  RECURRENCE: (7) Patient developed left inner breast nodule and underwent mammogram and ultrasound on 08/06/17 showing three areas of concern at 10 o'clock, 1130 o'clock, and at 8 o'clock. Biopsy on 08/07/2017 demonstrated at 11:30 fat necrosis, however at 10:00 it was positive for IDC, grade 3, ER+(50%), PR-(0%), Ki-67 90%, HER-2 negative (ratio 1.48).  (a) staging chest CT 08/15/2017 shows 0.5 cm left lower lobe lung nodule of uncertain significance  (8) status post left modified radical mastectomy and right simple mastectomy 08/25/2017 showing  (a) right breast, no evidence of malignancy  (b) left breast, ypT2 ypN1 invasive ductal carcinoma, grade 3, with negative margins; repeat prognostic panel: Triple negative  (9) started adjuvant chemotherapy with cyclophosphamide/methotrexate/fluorouracil [CMF] 11/10/2016, eight cycles planned  (a) continuing trastuzumab/Pertuzumab   (b) CMF chemotherapy discontinued after 4 cycles with disease progression  (10) status post left chest wall biopsy for further local recurrence 01/05/2018, the tumor being now weakly estrogen receptor positive, progesterone receptor negative, and again HER-2 negative  (11) PET scan 03/25/2018 negative except for a 1.2 cm left internal mammary lymph node, SUV 6.9  (12) adjuvant radiation completed 03/19/2018  (a) did not receive capecitabine  sensitization  (13)  Letrozole resumed 03/27/2018--discontinued 06/29/2018  (14) foundation one 01/09/2018 shows no actionable mutations, with stable microsatellite status and low mutational burden.  There was a p53  deletion.  (a) PD-L1 testing on immune cells, 0% staining (negative)--obtained on 08/07/2017 cycle, which was weakly ER positive, progesterone and HER-2 negative  (15) trastuzumab and Pertuzumab resumed 03/31/2018  (a) echocardiogram on 04/20/2018 showed an ejection fraction in the 50-55% range.   (b) changed to every 4-week treatments beginning 07/01/2018 to coordinate with fulvestrant  (c) echocardiogram 08/17/2018 shows a well-preserved ejection fraction.  (d) pertuzumab discontinued as of December 2019   (16) fine-needle aspiration of a chest wall lesion on 06/04/2018 was read as "non-small cell carcinoma".  Punch biopsy obtained at the visit 06/08/2018 for prognostic panel determination showed poorly differentiated carcinoma, estrogen receptor positive at 60%, progesterone receptor negative, HER-2 negative by immunohistochemistry at 1+  (17) fulvestrant started 07/01/2018, repeat every 28 days  (a) mid September started palbociclib 125 mg daily, 21/7,   (b) discontinued with evidence of mixed response  (18) carboplatin/gemcitabine days 1 and 8 to start 09/29/2018, repeated every 21 days  (a) baseline PET scan 09/23/2018 shows progression in prior treatment, measurable disease  (b) CA-27-29 is now mildly informative   PLAN:   Deanndra's situation is complex.  She has stage IV and therefore incurable disease.  It is not very aggressive.  It does not involve bone and chest wall as well as some nodal stations.  We have evidence of prior HER-2 positivity, prior estrogen receptor positivity, and also of triple negative disease.  She has been checked for possible sensitivity to pembrolizumab but unfortunately did not type correctly for that.  She has not had genetics testing and so  we do not know if parked inhibitors might be useful  Most recently she has been receiving optimal anti-HER-2 and antiestrogen treatment.  Unfortunately we do see disease progression.  This could mean that the tumor is still estrogen receptor sensitive but no longer sensitive to the agents we have been using, similarly with HER-2 positivity; but I think more likely we are dealing with triple negative disease at this point.  Accordingly I would simplify her treatment and concentrate on the triple negative disease.  Specifically I would stop the antiestrogens.  I would stop the pembrolizumab and continue trastuzumab alone.  I would certainly continue the zoledronate which she is tolerating well.  We would then switch to chemotherapy.  We have many options including Doxil, eribulin, capecitabine, etc.  My recommendation however is that we do 4 cycles of carboplatin gemcitabine given days 1 and 8 of each cycle and then restage with a repeat PET scan.  I also think it is important for her to get genetically tested in case she turns out to be BRCA positive.  In that case we could try PA RP inhibitors.  I am referring her to our genetics counselors to evaluate that  Today we discussed the possible toxicities, side effects and complications of carboplatin and gemcitabine and unfortunately she will lose her hair.  I am going to see her with each treatment and of course we will be checking lab work as well.  After 3-4 cycles, which is 9 to 12 weeks, she will have a repeat PET scan to reassess.  Haizlee has a good understanding of this plan.  She agrees with it.  She knows the goal of treatment in her case is controlled.  She knows to call for any other issues that may develop before the next visit.   [name], [name], MD  09/24/18 8:47 AM Medical Oncology and Hematology [facility] [place],  [name] [address] Tel. [phone]    Fax. [phone]   I, Jacqualyn Posey am acting  as a Education administrator for Chauncey Cruel, MD.   I, Lurline Del MD, have reviewed the above documentation for accuracy and completeness, and I agree with the above.

## 2018-09-24 ENCOUNTER — Inpatient Hospital Stay: Payer: BLUE CROSS/BLUE SHIELD | Attending: Oncology

## 2018-09-24 ENCOUNTER — Inpatient Hospital Stay: Payer: BLUE CROSS/BLUE SHIELD

## 2018-09-24 ENCOUNTER — Inpatient Hospital Stay (HOSPITAL_BASED_OUTPATIENT_CLINIC_OR_DEPARTMENT_OTHER): Payer: BLUE CROSS/BLUE SHIELD | Admitting: Oncology

## 2018-09-24 ENCOUNTER — Other Ambulatory Visit: Payer: Self-pay | Admitting: Oncology

## 2018-09-24 VITALS — BP 163/83 | HR 96 | Temp 98.6°F | Resp 17 | Ht 69.0 in | Wt 206.6 lb

## 2018-09-24 DIAGNOSIS — Z5111 Encounter for antineoplastic chemotherapy: Secondary | ICD-10-CM | POA: Insufficient documentation

## 2018-09-24 DIAGNOSIS — C50212 Malignant neoplasm of upper-inner quadrant of left female breast: Secondary | ICD-10-CM | POA: Insufficient documentation

## 2018-09-24 DIAGNOSIS — Z170 Estrogen receptor positive status [ER+]: Secondary | ICD-10-CM | POA: Diagnosis not present

## 2018-09-24 DIAGNOSIS — Z5112 Encounter for antineoplastic immunotherapy: Secondary | ICD-10-CM | POA: Diagnosis not present

## 2018-09-24 DIAGNOSIS — R5383 Other fatigue: Secondary | ICD-10-CM | POA: Diagnosis not present

## 2018-09-24 DIAGNOSIS — Z5189 Encounter for other specified aftercare: Secondary | ICD-10-CM | POA: Insufficient documentation

## 2018-09-24 DIAGNOSIS — C7951 Secondary malignant neoplasm of bone: Secondary | ICD-10-CM | POA: Insufficient documentation

## 2018-09-24 DIAGNOSIS — Z7189 Other specified counseling: Secondary | ICD-10-CM

## 2018-09-24 DIAGNOSIS — C50912 Malignant neoplasm of unspecified site of left female breast: Secondary | ICD-10-CM

## 2018-09-24 DIAGNOSIS — G629 Polyneuropathy, unspecified: Secondary | ICD-10-CM | POA: Diagnosis not present

## 2018-09-24 DIAGNOSIS — Z95828 Presence of other vascular implants and grafts: Secondary | ICD-10-CM

## 2018-09-24 LAB — CBC WITH DIFFERENTIAL/PLATELET
Abs Immature Granulocytes: 0.02 10*3/uL (ref 0.00–0.07)
BASOS PCT: 3 %
Basophils Absolute: 0.1 10*3/uL (ref 0.0–0.1)
Eosinophils Absolute: 0.1 10*3/uL (ref 0.0–0.5)
Eosinophils Relative: 3 %
HCT: 32.9 % — ABNORMAL LOW (ref 36.0–46.0)
Hemoglobin: 11 g/dL — ABNORMAL LOW (ref 12.0–15.0)
Immature Granulocytes: 1 %
Lymphocytes Relative: 16 %
Lymphs Abs: 0.5 10*3/uL — ABNORMAL LOW (ref 0.7–4.0)
MCH: 33.1 pg (ref 26.0–34.0)
MCHC: 33.4 g/dL (ref 30.0–36.0)
MCV: 99.1 fL (ref 80.0–100.0)
Monocytes Absolute: 0.5 10*3/uL (ref 0.1–1.0)
Monocytes Relative: 17 %
Neutro Abs: 1.8 10*3/uL (ref 1.7–7.7)
Neutrophils Relative %: 60 %
Platelets: 242 10*3/uL (ref 150–400)
RBC: 3.32 MIL/uL — AB (ref 3.87–5.11)
RDW: 20.4 % — ABNORMAL HIGH (ref 11.5–15.5)
WBC: 3 10*3/uL — AB (ref 4.0–10.5)
nRBC: 0 % (ref 0.0–0.2)

## 2018-09-24 LAB — COMPREHENSIVE METABOLIC PANEL
ALT: 10 U/L (ref 0–44)
AST: 15 U/L (ref 15–41)
Albumin: 3.9 g/dL (ref 3.5–5.0)
Alkaline Phosphatase: 116 U/L (ref 38–126)
Anion gap: 10 (ref 5–15)
BUN: 14 mg/dL (ref 8–23)
CALCIUM: 9.3 mg/dL (ref 8.9–10.3)
CO2: 25 mmol/L (ref 22–32)
CREATININE: 0.93 mg/dL (ref 0.44–1.00)
Chloride: 108 mmol/L (ref 98–111)
GFR calc Af Amer: >60 mL/min (ref >60)
Glucose, Bld: 130 mg/dL — ABNORMAL HIGH (ref 70–99)
Potassium: 4.1 mmol/L (ref 3.5–5.1)
Sodium: 143 mmol/L (ref 135–145)
Total Bilirubin: 0.4 mg/dL (ref 0.3–1.2)
Total Protein: 6.9 g/dL (ref 6.5–8.1)

## 2018-09-24 MED ORDER — ACETAMINOPHEN 325 MG PO TABS
ORAL_TABLET | ORAL | Status: AC
  Filled 2018-09-24: qty 2

## 2018-09-24 MED ORDER — DIPHENHYDRAMINE HCL 25 MG PO CAPS
25.0000 mg | ORAL_CAPSULE | Freq: Once | ORAL | Status: AC
  Administered 2018-09-24: 25 mg via ORAL

## 2018-09-24 MED ORDER — DIPHENHYDRAMINE HCL 25 MG PO CAPS
ORAL_CAPSULE | ORAL | Status: AC
  Filled 2018-09-24: qty 1

## 2018-09-24 MED ORDER — ZOLEDRONIC ACID 4 MG/100ML IV SOLN
4.0000 mg | Freq: Once | INTRAVENOUS | Status: AC
  Administered 2018-09-24: 4 mg via INTRAVENOUS
  Filled 2018-09-24: qty 100

## 2018-09-24 MED ORDER — TRASTUZUMAB CHEMO 150 MG IV SOLR
6.0000 mg/kg | Freq: Once | INTRAVENOUS | Status: AC
  Administered 2018-09-24: 567 mg via INTRAVENOUS
  Filled 2018-09-24: qty 27

## 2018-09-24 MED ORDER — ACETAMINOPHEN 325 MG PO TABS
650.0000 mg | ORAL_TABLET | Freq: Once | ORAL | Status: AC
  Administered 2018-09-24: 650 mg via ORAL

## 2018-09-24 MED ORDER — HEPARIN SOD (PORK) LOCK FLUSH 100 UNIT/ML IV SOLN
500.0000 [IU] | Freq: Once | INTRAVENOUS | Status: AC | PRN
  Administered 2018-09-24: 500 [IU]
  Filled 2018-09-24: qty 5

## 2018-09-24 MED ORDER — SODIUM CHLORIDE 0.9% FLUSH
10.0000 mL | INTRAVENOUS | Status: DC | PRN
  Administered 2018-09-24: 10 mL
  Filled 2018-09-24: qty 10

## 2018-09-24 MED ORDER — SODIUM CHLORIDE 0.9 % IV SOLN
Freq: Once | INTRAVENOUS | Status: AC
  Administered 2018-09-24: 09:00:00 via INTRAVENOUS
  Filled 2018-09-24: qty 250

## 2018-09-24 NOTE — Progress Notes (Signed)
Per Dr. Jana Hakim.  No perjeta today.  Herceptin and zometa only.

## 2018-09-24 NOTE — Patient Instructions (Addendum)
[facility] Discharge Instructions for Patients Receiving Chemotherapy  Today you received the following chemotherapy agents: Herceptin  To help prevent nausea and vomiting after your treatment, we encourage you to take your nausea medication as directed.   If you develop nausea and vomiting that is not controlled by your nausea medication, call the clinic.   BELOW ARE SYMPTOMS THAT SHOULD BE REPORTED IMMEDIATELY:  *FEVER GREATER THAN 100.5 F  *CHILLS WITH OR WITHOUT FEVER  NAUSEA AND VOMITING THAT IS NOT CONTROLLED WITH YOUR NAUSEA MEDICATION  *UNUSUAL SHORTNESS OF BREATH  *UNUSUAL BRUISING OR BLEEDING  TENDERNESS IN MOUTH AND THROAT WITH OR WITHOUT PRESENCE OF ULCERS  *URINARY PROBLEMS  *BOWEL PROBLEMS  UNUSUAL RASH Items with * indicate a potential emergency and should be followed up as soon as possible.  Feel free to call the clinic should you have any questions or concerns. The clinic phone number is [phone].  Please show the [place] [facility] at check-in to the Emergency Department and triage nurse.  Gemcitabine injection What is this medicine? GEMCITABINE (jem SIT a been) is a chemotherapy drug. This medicine is used to treat many types of cancer like breast cancer, lung cancer, pancreatic cancer, and ovarian cancer. This medicine may be used for other purposes; ask your health care provider or pharmacist if you have questions. COMMON BRAND NAME(S): Gemzar What should I tell my health care provider before I take this medicine? They need to know if you have any of these conditions: -blood disorders -infection -kidney disease -liver disease -recent or ongoing radiation therapy -an unusual or allergic reaction to gemcitabine, other chemotherapy, other medicines, foods, dyes, or preservatives -pregnant or trying to get pregnant -breast-feeding How should I use this medicine? This drug is given as an infusion into a vein. It is administered in a  hospital or clinic by a specially trained health care professional. Talk to your pediatrician regarding the use of this medicine in children. Special care may be needed. Overdosage: If you think you have taken too much of this medicine contact a poison control center or emergency room at once. NOTE: This medicine is only for you. Do not share this medicine with others. What if I miss a dose? It is important not to miss your dose. Call your doctor or health care professional if you are unable to keep an appointment. What may interact with this medicine? -medicines to increase blood counts like filgrastim, pegfilgrastim, sargramostim -some other chemotherapy drugs like cisplatin -vaccines Talk to your doctor or health care professional before taking any of these medicines: -acetaminophen -aspirin -ibuprofen -ketoprofen -naproxen This list may not describe all possible interactions. Give your health care provider a list of all the medicines, herbs, non-prescription drugs, or dietary supplements you use. Also tell them if you smoke, drink alcohol, or use illegal drugs. Some items may interact with your medicine. What should I watch for while using this medicine? Visit your doctor for checks on your progress. This drug may make you feel generally unwell. This is not uncommon, as chemotherapy can affect healthy cells as well as cancer cells. Report any side effects. Continue your course of treatment even though you feel ill unless your doctor tells you to stop. In some cases, you may be given additional medicines to help with side effects. Follow all directions for their use. Call your doctor or health care professional for advice if you get a fever, chills or sore throat, or other symptoms of a cold or  flu. Do not treat yourself. This drug decreases your body's ability to fight infections. Try to avoid being around people who are sick. This medicine may increase your risk to bruise or bleed. Call  your doctor or health care professional if you notice any unusual bleeding. Be careful brushing and flossing your teeth or using a toothpick because you may get an infection or bleed more easily. If you have any dental work done, tell your dentist you are receiving this medicine. Avoid taking products that contain aspirin, acetaminophen, ibuprofen, naproxen, or ketoprofen unless instructed by your doctor. These medicines may hide a fever. Women should inform their doctor if they wish to become pregnant or think they might be pregnant. There is a potential for serious side effects to an unborn child. Talk to your health care professional or pharmacist for more information. Do not breast-feed an infant while taking this medicine. What side effects may I notice from receiving this medicine? Side effects that you should report to your doctor or health care professional as soon as possible: -allergic reactions like skin rash, itching or hives, swelling of the face, lips, or tongue -low blood counts - this medicine may decrease the number of white blood cells, red blood cells and platelets. You may be at increased risk for infections and bleeding. -signs of infection - fever or chills, cough, sore throat, pain or difficulty passing urine -signs of decreased platelets or bleeding - bruising, pinpoint red spots on the skin, black, tarry stools, blood in the urine -signs of decreased red blood cells - unusually weak or tired, fainting spells, lightheadedness -breathing problems -chest pain -mouth sores -nausea and vomiting -pain, swelling, redness at site where injected -pain, tingling, numbness in the hands or feet -stomach pain -swelling of ankles, feet, hands -unusual bleeding Side effects that usually do not require medical attention (report to your doctor or health care professional if they continue or are bothersome): -constipation -diarrhea -hair loss -loss of appetite -stomach upset This  list may not describe all possible side effects. Call your doctor for medical advice about side effects. You may report side effects to FDA at 1-800-FDA-1088. Where should I keep my medicine? This drug is given in a hospital or clinic and will not be stored at home. NOTE: This sheet is a summary. It may not cover all possible information. If you have questions about this medicine, talk to your doctor, pharmacist, or health care provider.  2018 [employer]/Gold Standard (2008-02-16 18:45:54)  Carboplatin injection What is this medicine? CARBOPLATIN (KAR boe pla tin) is a chemotherapy drug. It targets fast dividing cells, like cancer cells, and causes these cells to die. This medicine is used to treat ovarian cancer and many other cancers. This medicine may be used for other purposes; ask your health care provider or pharmacist if you have questions. COMMON BRAND NAME(S): Paraplatin What should I tell my health care provider before I take this medicine? They need to know if you have any of these conditions: -blood disorders -hearing problems -kidney disease -recent or ongoing radiation therapy -an unusual or allergic reaction to carboplatin, cisplatin, other chemotherapy, other medicines, foods, dyes, or preservatives -pregnant or trying to get pregnant -breast-feeding How should I use this medicine? This drug is usually given as an infusion into a vein. It is administered in a hospital or clinic by a specially trained health care professional. Talk to your pediatrician regarding the use of this medicine in children. Special care may be needed. Overdosage: If you  think you have taken too much of this medicine contact a poison control center or emergency room at once. NOTE: This medicine is only for you. Do not share this medicine with others. What if I miss a dose? It is important not to miss a dose. Call your doctor or health care professional if you are unable to keep an appointment. What  may interact with this medicine? -medicines for seizures -medicines to increase blood counts like filgrastim, pegfilgrastim, sargramostim -some antibiotics like amikacin, gentamicin, neomycin, streptomycin, tobramycin -vaccines Talk to your doctor or health care professional before taking any of these medicines: -acetaminophen -aspirin -ibuprofen -ketoprofen -naproxen This list may not describe all possible interactions. Give your health care provider a list of all the medicines, herbs, non-prescription drugs, or dietary supplements you use. Also tell them if you smoke, drink alcohol, or use illegal drugs. Some items may interact with your medicine. What should I watch for while using this medicine? Your condition will be monitored carefully while you are receiving this medicine. You will need important blood work done while you are taking this medicine. This drug may make you feel generally unwell. This is not uncommon, as chemotherapy can affect healthy cells as well as cancer cells. Report any side effects. Continue your course of treatment even though you feel ill unless your doctor tells you to stop. In some cases, you may be given additional medicines to help with side effects. Follow all directions for their use. Call your doctor or health care professional for advice if you get a fever, chills or sore throat, or other symptoms of a cold or flu. Do not treat yourself. This drug decreases your body's ability to fight infections. Try to avoid being around people who are sick. This medicine may increase your risk to bruise or bleed. Call your doctor or health care professional if you notice any unusual bleeding. Be careful brushing and flossing your teeth or using a toothpick because you may get an infection or bleed more easily. If you have any dental work done, tell your dentist you are receiving this medicine. Avoid taking products that contain aspirin, acetaminophen, ibuprofen, naproxen,  or ketoprofen unless instructed by your doctor. These medicines may hide a fever. Do not become pregnant while taking this medicine. Women should inform their doctor if they wish to become pregnant or think they might be pregnant. There is a potential for serious side effects to an unborn child. Talk to your health care professional or pharmacist for more information. Do not breast-feed an infant while taking this medicine. What side effects may I notice from receiving this medicine? Side effects that you should report to your doctor or health care professional as soon as possible: -allergic reactions like skin rash, itching or hives, swelling of the face, lips, or tongue -signs of infection - fever or chills, cough, sore throat, pain or difficulty passing urine -signs of decreased platelets or bleeding - bruising, pinpoint red spots on the skin, black, tarry stools, nosebleeds -signs of decreased red blood cells - unusually weak or tired, fainting spells, lightheadedness -breathing problems -changes in hearing -changes in vision -chest pain -high blood pressure -low blood counts - This drug may decrease the number of white blood cells, red blood cells and platelets. You may be at increased risk for infections and bleeding. -nausea and vomiting -pain, swelling, redness or irritation at the injection site -pain, tingling, numbness in the hands or feet -problems with balance, talking, walking -trouble passing urine or  change in the amount of urine Side effects that usually do not require medical attention (report to your doctor or health care professional if they continue or are bothersome): -hair loss -loss of appetite -metallic taste in the mouth or changes in taste This list may not describe all possible side effects. Call your doctor for medical advice about side effects. You may report side effects to FDA at 1-800-FDA-1088. Where should I keep my medicine? This drug is given in a hospital  or clinic and will not be stored at home. NOTE: This sheet is a summary. It may not cover all possible information. If you have questions about this medicine, talk to your doctor, pharmacist, or health care provider.  2018 Elsevier/Gold Standard (2008-01-12 14:38:05)

## 2018-09-24 NOTE — Progress Notes (Signed)
DISCONTINUE OFF PATHWAY REGIMEN - Breast   [id number]:CMF (IV cyclophosphamide) q21 days:   A cycle is every 21 days:     Cyclophosphamide      Methotrexate      5-Fluorouracil   **Always confirm dose/schedule in your pharmacy ordering system**  REASON: Other Reason PRIOR TREATMENT: Off Pathway: CMF (IV cyclophosphamide) q21 days TREATMENT RESPONSE: Unable to Evaluate  START OFF PATHWAY REGIMEN - Breast   [id number]:Gemcitabine + Carboplatin (1000/2) q21 Days:   A cycle is every 21 days:     Gemcitabine      Carboplatin   **Always confirm dose/schedule in your pharmacy ordering system**  Patient Characteristics: 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

## 2018-09-25 LAB — CANCER ANTIGEN 27.29: CA 27.29: 86 U/mL — ABNORMAL HIGH (ref 0.0–38.6)

## 2018-09-26 ENCOUNTER — Other Ambulatory Visit: Payer: Self-pay | Admitting: Nurse Practitioner

## 2018-09-28 ENCOUNTER — Other Ambulatory Visit: Payer: Self-pay

## 2018-09-28 ENCOUNTER — Inpatient Hospital Stay: Payer: BLUE CROSS/BLUE SHIELD

## 2018-09-28 ENCOUNTER — Telehealth: Payer: Self-pay | Admitting: Oncology

## 2018-09-28 ENCOUNTER — Telehealth: Payer: Self-pay

## 2018-09-28 MED ORDER — TIZANIDINE HCL 4 MG PO TABS: ORAL_TABLET | ORAL | 1 refills | Status: DC

## 2018-09-28 MED ORDER — BUSPIRONE HCL 15 MG PO TABS: 15.0000 mg | ORAL_TABLET | Freq: Three times a day (TID) | ORAL | 0 refills | Status: DC

## 2018-09-28 NOTE — Telephone Encounter (Signed)
  Pt was told by GM she would start chemo on 12/10.  Pt was actually scheduled today but did not realize this until after appt times. Karle Starch will schedule pt's chemo appt for tomorrown and  High Priority msg sent to schedulers to schedule lab/flush and call pt.

## 2018-09-28 NOTE — Telephone Encounter (Signed)
Called to confirm appts per 12/9 sch message. Left message with appt date and time

## 2018-09-29 ENCOUNTER — Inpatient Hospital Stay: Payer: BLUE CROSS/BLUE SHIELD

## 2018-09-29 ENCOUNTER — Other Ambulatory Visit: Payer: Self-pay | Admitting: Oncology

## 2018-09-29 ENCOUNTER — Telehealth: Payer: Self-pay | Admitting: Oncology

## 2018-09-29 VITALS — BP 129/71 | HR 76 | Temp 95.5°F | Resp 18

## 2018-09-29 DIAGNOSIS — C50912 Malignant neoplasm of unspecified site of left female breast: Secondary | ICD-10-CM

## 2018-09-29 DIAGNOSIS — Z170 Estrogen receptor positive status [ER+]: Principal | ICD-10-CM

## 2018-09-29 DIAGNOSIS — C50212 Malignant neoplasm of upper-inner quadrant of left female breast: Secondary | ICD-10-CM

## 2018-09-29 DIAGNOSIS — Z95828 Presence of other vascular implants and grafts: Secondary | ICD-10-CM

## 2018-09-29 DIAGNOSIS — Z5112 Encounter for antineoplastic immunotherapy: Secondary | ICD-10-CM | POA: Diagnosis not present

## 2018-09-29 LAB — COMPREHENSIVE METABOLIC PANEL
ALK PHOS: 115 U/L (ref 38–126)
ALT: 8 U/L (ref 0–44)
AST: 12 U/L — ABNORMAL LOW (ref 15–41)
Albumin: 3.7 g/dL (ref 3.5–5.0)
Anion gap: 10 (ref 5–15)
BUN: 11 mg/dL (ref 8–23)
CALCIUM: 9.2 mg/dL (ref 8.9–10.3)
CO2: 24 mmol/L (ref 22–32)
Chloride: 107 mmol/L (ref 98–111)
Creatinine, Ser: 0.78 mg/dL (ref 0.44–1.00)
GFR calc Af Amer: >60 mL/min (ref >60)
GFR calc non Af Amer: >60 mL/min (ref >60)
Glucose, Bld: 105 mg/dL — ABNORMAL HIGH (ref 70–99)
Potassium: 4.1 mmol/L (ref 3.5–5.1)
Sodium: 141 mmol/L (ref 135–145)
Total Bilirubin: 0.3 mg/dL (ref 0.3–1.2)
Total Protein: 6.7 g/dL (ref 6.5–8.1)

## 2018-09-29 LAB — CBC WITH DIFFERENTIAL/PLATELET
Abs Immature Granulocytes: 0.05 10*3/uL (ref 0.00–0.07)
Basophils Absolute: 0.1 10*3/uL (ref 0.0–0.1)
Basophils Relative: 3 %
EOS ABS: 0.2 10*3/uL (ref 0.0–0.5)
Eosinophils Relative: 4 %
HCT: 32.6 % — ABNORMAL LOW (ref 36.0–46.0)
Hemoglobin: 10.8 g/dL — ABNORMAL LOW (ref 12.0–15.0)
Immature Granulocytes: 1 %
Lymphocytes Relative: 13 %
Lymphs Abs: 0.6 10*3/uL — ABNORMAL LOW (ref 0.7–4.0)
MCH: 32.6 pg (ref 26.0–34.0)
MCHC: 33.1 g/dL (ref 30.0–36.0)
MCV: 98.5 fL (ref 80.0–100.0)
Monocytes Absolute: 0.4 10*3/uL (ref 0.1–1.0)
Monocytes Relative: 10 %
Neutro Abs: 3.2 10*3/uL (ref 1.7–7.7)
Neutrophils Relative %: 69 %
Platelets: 318 10*3/uL (ref 150–400)
RBC: 3.31 MIL/uL — AB (ref 3.87–5.11)
RDW: 19.2 % — ABNORMAL HIGH (ref 11.5–15.5)
WBC: 4.6 10*3/uL (ref 4.0–10.5)
nRBC: 0.9 % — ABNORMAL HIGH (ref 0.0–0.2)

## 2018-09-29 MED ORDER — DEXAMETHASONE SODIUM PHOSPHATE 10 MG/ML IJ SOLN
INTRAMUSCULAR | Status: AC
  Filled 2018-09-29: qty 1

## 2018-09-29 MED ORDER — SODIUM CHLORIDE 0.9 % IV SOLN
Freq: Once | INTRAVENOUS | Status: AC
  Administered 2018-09-29: 11:00:00 via INTRAVENOUS
  Filled 2018-09-29: qty 250

## 2018-09-29 MED ORDER — SODIUM CHLORIDE 0.9% FLUSH
10.0000 mL | INTRAVENOUS | Status: DC | PRN
  Administered 2018-09-29: 10 mL via INTRAVENOUS
  Filled 2018-09-29: qty 10

## 2018-09-29 MED ORDER — SODIUM CHLORIDE 0.9 % IV SOLN
2000.0000 mg | Freq: Once | INTRAVENOUS | Status: AC
  Administered 2018-09-29: 2000 mg via INTRAVENOUS
  Filled 2018-09-29: qty 52.6

## 2018-09-29 MED ORDER — SODIUM CHLORIDE 0.9 % IV SOLN: 10.0000 mg | Freq: Once | INTRAVENOUS | Status: DC

## 2018-09-29 MED ORDER — DEXAMETHASONE SODIUM PHOSPHATE 10 MG/ML IJ SOLN
10.0000 mg | Freq: Once | INTRAMUSCULAR | Status: AC
  Administered 2018-09-29: 10 mg via INTRAVENOUS

## 2018-09-29 MED ORDER — SODIUM CHLORIDE 0.9 % IV SOLN
268.4000 mg | Freq: Once | INTRAVENOUS | Status: AC
  Administered 2018-09-29: 270 mg via INTRAVENOUS
  Filled 2018-09-29: qty 27

## 2018-09-29 MED ORDER — HEPARIN SOD (PORK) LOCK FLUSH 100 UNIT/ML IV SOLN
500.0000 [IU] | Freq: Once | INTRAVENOUS | Status: AC | PRN
  Administered 2018-09-29: 500 [IU]
  Filled 2018-09-29: qty 5

## 2018-09-29 MED ORDER — PALONOSETRON HCL INJECTION 0.25 MG/5ML
INTRAVENOUS | Status: AC
  Filled 2018-09-29: qty 5

## 2018-09-29 MED ORDER — PALONOSETRON HCL INJECTION 0.25 MG/5ML
0.2500 mg | Freq: Once | INTRAVENOUS | Status: AC
  Administered 2018-09-29: 0.25 mg via INTRAVENOUS

## 2018-09-29 MED ORDER — SODIUM CHLORIDE 0.9% FLUSH
10.0000 mL | INTRAVENOUS | Status: DC | PRN
  Administered 2018-09-29: 10 mL
  Filled 2018-09-29: qty 10

## 2018-09-29 NOTE — Telephone Encounter (Signed)
12/30 appt added for 8 am per msg from GM. Moved up lab/port/infusion from afternoon to morning.

## 2018-09-29 NOTE — Patient Instructions (Signed)
[facility] Discharge Instructions for Patients Receiving Chemotherapy  Today you received the following chemotherapy agents: Gemzar, Carboplatin.  To help prevent nausea and vomiting after your treatment, we encourage you to take your nausea medication as prescribed. If you develop nausea and vomiting that is not controlled by your nausea medication, call the clinic.   BELOW ARE SYMPTOMS THAT SHOULD BE REPORTED IMMEDIATELY:  *FEVER GREATER THAN 100.5 F  *CHILLS WITH OR WITHOUT FEVER  NAUSEA AND VOMITING THAT IS NOT CONTROLLED WITH YOUR NAUSEA MEDICATION  *UNUSUAL SHORTNESS OF BREATH  *UNUSUAL BRUISING OR BLEEDING  TENDERNESS IN MOUTH AND THROAT WITH OR WITHOUT PRESENCE OF ULCERS  *URINARY PROBLEMS  *BOWEL PROBLEMS  UNUSUAL RASH Items with * indicate a potential emergency and should be followed up as soon as possible.  Feel free to call the clinic should you have any questions or concerns. The clinic phone number is [phone].  Please show the [facility] at check-in to the Emergency Department and triage nurse.   Gemcitabine injection (Gemzar) What is this medicine? GEMCITABINE (jem SIT a been) is a chemotherapy drug. This medicine is used to treat many types of cancer like breast cancer, lung cancer, pancreatic cancer, and ovarian cancer. This medicine may be used for other purposes; ask your health care provider or pharmacist if you have questions. COMMON BRAND NAME(S): Gemzar What should I tell my health care provider before I take this medicine? They need to know if you have any of these conditions: -blood disorders -infection -kidney disease -liver disease -recent or ongoing radiation therapy -an unusual or allergic reaction to gemcitabine, other chemotherapy, other medicines, foods, dyes, or preservatives -pregnant or trying to get pregnant -breast-feeding How should I use this medicine? This drug is given as an infusion into a vein. It  is administered in a hospital or clinic by a specially trained health care professional. Talk to your pediatrician regarding the use of this medicine in children. Special care may be needed. Overdosage: If you think you have taken too much of this medicine contact a poison control center or emergency room at once. NOTE: This medicine is only for you. Do not share this medicine with others. What if I miss a dose? It is important not to miss your dose. Call your doctor or health care professional if you are unable to keep an appointment. What may interact with this medicine? -medicines to increase blood counts like filgrastim, pegfilgrastim, sargramostim -some other chemotherapy drugs like cisplatin -vaccines Talk to your doctor or health care professional before taking any of these medicines: -acetaminophen -aspirin -ibuprofen -ketoprofen -naproxen This list may not describe all possible interactions. Give your health care provider a list of all the medicines, herbs, non-prescription drugs, or dietary supplements you use. Also tell them if you smoke, drink alcohol, or use illegal drugs. Some items may interact with your medicine. What should I watch for while using this medicine? Visit your doctor for checks on your progress. This drug may make you feel generally unwell. This is not uncommon, as chemotherapy can affect healthy cells as well as cancer cells. Report any side effects. Continue your course of treatment even though you feel ill unless your doctor tells you to stop. In some cases, you may be given additional medicines to help with side effects. Follow all directions for their use. Call your doctor or health care professional for advice if you get a fever, chills or sore throat, or other symptoms of a cold  or flu. Do not treat yourself. This drug decreases your body's ability to fight infections. Try to avoid being around people who are sick. This medicine may increase your risk to  bruise or bleed. Call your doctor or health care professional if you notice any unusual bleeding. Be careful brushing and flossing your teeth or using a toothpick because you may get an infection or bleed more easily. If you have any dental work done, tell your dentist you are receiving this medicine. Avoid taking products that contain aspirin, acetaminophen, ibuprofen, naproxen, or ketoprofen unless instructed by your doctor. These medicines may hide a fever. Women should inform their doctor if they wish to become pregnant or think they might be pregnant. There is a potential for serious side effects to an unborn child. Talk to your health care professional or pharmacist for more information. Do not breast-feed an infant while taking this medicine. What side effects may I notice from receiving this medicine? Side effects that you should report to your doctor or health care professional as soon as possible: -allergic reactions like skin rash, itching or hives, swelling of the face, lips, or tongue -low blood counts - this medicine may decrease the number of white blood cells, red blood cells and platelets. You may be at increased risk for infections and bleeding. -signs of infection - fever or chills, cough, sore throat, pain or difficulty passing urine -signs of decreased platelets or bleeding - bruising, pinpoint red spots on the skin, black, tarry stools, blood in the urine -signs of decreased red blood cells - unusually weak or tired, fainting spells, lightheadedness -breathing problems -chest pain -mouth sores -nausea and vomiting -pain, swelling, redness at site where injected -pain, tingling, numbness in the hands or feet -stomach pain -swelling of ankles, feet, hands -unusual bleeding Side effects that usually do not require medical attention (report to your doctor or health care professional if they continue or are bothersome): -constipation -diarrhea -hair loss -loss of  appetite -stomach upset This list may not describe all possible side effects. Call your doctor for medical advice about side effects. You may report side effects to FDA at 1-800-FDA-1088. Where should I keep my medicine? This drug is given in a hospital or clinic and will not be stored at home. NOTE: This sheet is a summary. It may not cover all possible information. If you have questions about this medicine, talk to your doctor, pharmacist, or health care provider.  2018 [employer] Standard (2008-02-16 18:45:54)   Carboplatin injection What is this medicine? CARBOPLATIN (KAR boe pla tin) is a chemotherapy drug. It targets fast dividing cells, like cancer cells, and causes these cells to die. This medicine is used to treat ovarian cancer and many other cancers. This medicine may be used for other purposes; ask your health care provider or pharmacist if you have questions. COMMON BRAND NAME(S): Paraplatin What should I tell my health care provider before I take this medicine? They need to know if you have any of these conditions: -blood disorders -hearing problems -kidney disease -recent or ongoing radiation therapy -an unusual or allergic reaction to carboplatin, cisplatin, other chemotherapy, other medicines, foods, dyes, or preservatives -pregnant or trying to get pregnant -breast-feeding How should I use this medicine? This drug is usually given as an infusion into a vein. It is administered in a hospital or clinic by a specially trained health care professional. Talk to your pediatrician regarding the use of this medicine in children. Special care may be needed. Overdosage:  If you think you have taken too much of this medicine contact a poison control center or emergency room at once. NOTE: This medicine is only for you. Do not share this medicine with others. What if I miss a dose? It is important not to miss a dose. Call your doctor or health care professional if you are unable  to keep an appointment. What may interact with this medicine? -medicines for seizures -medicines to increase blood counts like filgrastim, pegfilgrastim, sargramostim -some antibiotics like amikacin, gentamicin, neomycin, streptomycin, tobramycin -vaccines Talk to your doctor or health care professional before taking any of these medicines: -acetaminophen -aspirin -ibuprofen -ketoprofen -naproxen This list may not describe all possible interactions. Give your health care provider a list of all the medicines, herbs, non-prescription drugs, or dietary supplements you use. Also tell them if you smoke, drink alcohol, or use illegal drugs. Some items may interact with your medicine. What should I watch for while using this medicine? Your condition will be monitored carefully while you are receiving this medicine. You will need important blood work done while you are taking this medicine. This drug may make you feel generally unwell. This is not uncommon, as chemotherapy can affect healthy cells as well as cancer cells. Report any side effects. Continue your course of treatment even though you feel ill unless your doctor tells you to stop. In some cases, you may be given additional medicines to help with side effects. Follow all directions for their use. Call your doctor or health care professional for advice if you get a fever, chills or sore throat, or other symptoms of a cold or flu. Do not treat yourself. This drug decreases your body's ability to fight infections. Try to avoid being around people who are sick. This medicine may increase your risk to bruise or bleed. Call your doctor or health care professional if you notice any unusual bleeding. Be careful brushing and flossing your teeth or using a toothpick because you may get an infection or bleed more easily. If you have any dental work done, tell your dentist you are receiving this medicine. Avoid taking products that contain aspirin,  acetaminophen, ibuprofen, naproxen, or ketoprofen unless instructed by your doctor. These medicines may hide a fever. Do not become pregnant while taking this medicine. Women should inform their doctor if they wish to become pregnant or think they might be pregnant. There is a potential for serious side effects to an unborn child. Talk to your health care professional or pharmacist for more information. Do not breast-feed an infant while taking this medicine. What side effects may I notice from receiving this medicine? Side effects that you should report to your doctor or health care professional as soon as possible: -allergic reactions like skin rash, itching or hives, swelling of the face, lips, or tongue -signs of infection - fever or chills, cough, sore throat, pain or difficulty passing urine -signs of decreased platelets or bleeding - bruising, pinpoint red spots on the skin, black, tarry stools, nosebleeds -signs of decreased red blood cells - unusually weak or tired, fainting spells, lightheadedness -breathing problems -changes in hearing -changes in vision -chest pain -high blood pressure -low blood counts - This drug may decrease the number of white blood cells, red blood cells and platelets. You may be at increased risk for infections and bleeding. -nausea and vomiting -pain, swelling, redness or irritation at the injection site -pain, tingling, numbness in the hands or feet -problems with balance, talking, walking -trouble passing  urine or change in the amount of urine Side effects that usually do not require medical attention (report to your doctor or health care professional if they continue or are bothersome): -hair loss -loss of appetite -metallic taste in the mouth or changes in taste This list may not describe all possible side effects. Call your doctor for medical advice about side effects. You may report side effects to FDA at 1-800-FDA-1088. Where should I keep my  medicine? This drug is given in a hospital or clinic and will not be stored at home. NOTE: This sheet is a summary. It may not cover all possible information. If you have questions about this medicine, talk to your doctor, pharmacist, or health care provider.  2018 [employer]/Gold Standard (2008-01-12 14:38:05)

## 2018-10-01 ENCOUNTER — Other Ambulatory Visit: Payer: Self-pay | Admitting: *Deleted

## 2018-10-01 DIAGNOSIS — Z170 Estrogen receptor positive status [ER+]: Principal | ICD-10-CM

## 2018-10-01 DIAGNOSIS — C50212 Malignant neoplasm of upper-inner quadrant of left female breast: Secondary | ICD-10-CM

## 2018-10-01 MED ORDER — PROCHLORPERAZINE MALEATE 10 MG PO TABS: 10.0000 mg | ORAL_TABLET | Freq: Four times a day (QID) | ORAL | 1 refills | Status: AC | PRN

## 2018-10-01 MED ORDER — ONDANSETRON HCL 8 MG PO TABS: 8.0000 mg | ORAL_TABLET | Freq: Two times a day (BID) | ORAL | 1 refills | Status: AC | PRN

## 2018-10-04 NOTE — Progress Notes (Signed)
[name]  [phone] [phone]     ID: [name] DOB: [birth year]  MR#: [id number]  HCW#:[id number]  Patient Care Team: Chesley Noon, MD as PCP - General (Family Medicine) Excell Seltzer, MD as Consulting Physician (General Surgery) Loletha Bertini, [name], MD as Consulting Physician (Oncology) Gery Pray, MD as Consulting Physician (Radiation Oncology) Collene Gobble, MD as Consulting Physician (Pulmonary Disease) Verdell Carmine, MD as Referring Physician (Internal Medicine) Larey Dresser, MD as Consulting Physician (Cardiology) Beverely Pace, MD as Referring Physician (Surgical Oncology) OTHER MD: Dr Lyda Jester [phone])  CHIEF COMPLAINT: estrogen receptor positive, HER-2 positive breast cancer; triple negative breast cancers  CURRENT TREATMENT: Adjuvant Trastuzumab,  zolendronate, carboplatin/gemcitabine   INTERVAL HISTORY: [name] returns today for follow-up of her complex breast cancer history, which includes triple negative breast cancer.  For the HER-2 positive portion of her cancer she is currently receiving trastuzumab alone.  She is tolerating this well. Her most recent echocardiogram was 08/17/2018 and showed an ejection fraction in the 60-65%  The patient also continues on zolendronate every 12 weeks, with her most recent dose 09/24/2018.  She tolerates this with no side effects that she is aware of  Her antiestrogen and cycling inhibitors therapy is being held while she receives chemotherapy.  Today is day 8 cycle 1 of carboplatin/gemcitabine which she receives day 1 and day 8 of each cycle  REVIEW OF SYSTEMS: [name] did generally well with the first dose of carboplatin/Gemzar.  The main symptoms she had was feeling tired and draggy.  This began on day 3.  Days 1 and 2 she actually felt quite well and was able to do quite a bit of shopping.  Otherwise she felt sleepy and tired as stated.  She had  minimal constipation which she took care of with stool softeners.  She had no nausea, no mouth sores, no fevers, rash, bleeding, or other systemic symptoms.  A detailed review of systems today was otherwise noncontributory  BREAST CANCER HISTORY: From the original intake note:  "[name]" had a fall while traveling living to [place] in their RV and injure her left upper arm and  her left breast . Approximately 3 weeks later she noted a mass in the left breast upper inner quadrant. It was somewhat tender. She brought this to medical attention and on 09/27/2016 underwent bilateral diagnostic mammography with tomography [facility] The breast density was category B. The right breast was benign. In the left breast upper inner quadrant there was a large lobulated hyperdense mass which by exam was firm and nonmobile. By ultrasonography in the 11:30 o'clock radius 4 cm from the nipple there was a mass extending to the subcutaneous tissues and measuring 4.9 cm. There was a small superficial fluid collection overlying this consistent with a hematoma. The left axilla was benign.  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  The patient's subsequent history is as detailed below   PAST MEDICAL HISTORY: Past Medical History:  Diagnosis Date  . Anxiety   . CAP (community acquired pneumonia)   . Depression   . Malignant neoplasm of upper-inner quadrant of left female breast ([place]) 10/01/2016  . Personal history of chemotherapy   . Tobacco  use     PAST SURGICAL HISTORY: Past Surgical History:  Procedure Laterality Date  . ABDOMINAL HYSTERECTOMY  2011   Laparoscopic  . AUGMENTATION MAMMAPLASTY  Bilateral 03/2017  . BREAST LUMPECTOMY Left 04/05/2017  . BREAST LUMPECTOMY WITH SENTINEL LYMPH NODE BIOPSY Left 04/07/2017  . BREAST REDUCTION SURGERY Left 04/07/2017  . CHOLECYSTECTOMY    . MASTECTOMY Bilateral 08/25/2017   at [facility]  . open reduction left ankle    . SKIN GRAFT Left 06/19/2017   bil skin grafts to breasts    FAMILY HISTORY Family History  Problem Relation Age of Onset  . Heart disease Mother   . Heart disease Father   . Cancer Sister        ovarian ca   The patient's father died at the age of 15 from a myocardial infarction. The patient's mother died at the age of 66 with congestive heart failure. The patient had one brother, 2 sisters. One of her sisters was diagnosed with ovarian cancer at age 17 and died at age 26 from that disease  GYNECOLOGIC HISTORY:  No LMP recorded. Patient has had a hysterectomy. Menarche age 80, first live birth age 51. The patient is GX P1. She stopped having periods approximately 2008. She did not take hormone replacement. She used oral contraceptives for approximately 30 years with no complications.  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 Her husband [name] is retired from [employer]. Their daughter [name] lives in [place] and is Mudlogger of the [employer] life in [employer] and her husband works as an Sales executive at [employer]. The patient welcomed her 1st grandchild, a boy named S[place]ton in May 2019.      ADVANCED DIRECTIVES: not in place   HEALTH MAINTENANCE: Social History   Tobacco Use  . Smoking status: Former Smoker    Packs/day: 0.50    Years: 20.00    Pack years: 10.00    Last attempt to quit: 10/21/2016    Years since quitting: 1.9  . Smokeless tobacco: Never Used  Substance Use Topics  . Alcohol use: No  . Drug use: No     Colonoscopy:  PAP:  Bone density:   No Known Allergies  Current Outpatient Medications    Medication Sig Dispense Refill  . acetaminophen (TYLENOL) 325 MG tablet Take 650 mg by mouth every 6 (six) hours as needed for moderate pain.     Marland Kitchen ALPRAZolam (XANAX XR) 1 MG 24 hr tablet TAKE 1 TABLET BY MOUTH EVERY DAY 30 tablet 1  . busPIRone (BUSPAR) 15 MG tablet Take 1 tablet (15 mg total) by mouth 3 (three) times daily. 90 tablet 0  . diphenhydrAMINE (BENADRYL) 25 MG tablet Take 50 mg by mouth as needed for sleep.     Marland Kitchen gabapentin (NEURONTIN) 300 MG capsule Take 1 capsule (300 mg total) by mouth at bedtime. 90 capsule 4  . ibuprofen (ADVIL,MOTRIN) 200 MG tablet Take 600 mg by mouth every 6 (six) hours as needed for moderate pain.     Marland Kitchen ondansetron (ZOFRAN) 8 MG tablet Take 1 tablet (8 mg total) by mouth 2 (two) times daily as needed for refractory nausea / vomiting. Start on day 3 after chemotherapy. 30 tablet 1  . palbociclib (IBRANCE) 125 MG capsule Take 1 capsule (125 mg total) by mouth daily with breakfast. Take whole with food. Take for 21 days on, 7 days off,  repeat every 28 days. 21 capsule 4  . PARoxetine (PAXIL) 40 MG tablet Take 40 mg by mouth every morning.    . prochlorperazine (COMPAZINE) 10 MG tablet Take 1 tablet (10 mg total) by mouth every 6 (six) hours as needed (Nausea or vomiting). 30 tablet 1  . ranitidine (ZANTAC) 150 MG tablet Take 150 mg by mouth daily as needed for heartburn.     Marland Kitchen tiZANidine (ZANAFLEX) 4 MG tablet TAKE 1 TABLET BY MOUTH Bedtime 30 tablet 1  . valACYclovir (VALTREX) 1000 MG tablet Take 1 tablet (1,000 mg total) by mouth daily. 90 tablet 2   No current facility-administered medications for this visit.    Facility-Administered Medications Ordered in Other Visits  Medication Dose Route Frequency Provider Last Rate Last Dose  . 0.9 %  sodium chloride infusion   Intravenous Once Krysteena Stalker, [name], MD      . sodium chloride flush (NS) 0.9 % injection 10 mL  10 mL Intracatheter PRN Tomeko Scoville, [name], MD   10 mL at 10/05/18 1517     OBJECTIVE:  Middle-aged white woman who appears stated age  82:   10/05/18 1147  BP: (!) 154/70  Pulse: 97  Resp: 18  Temp: 98.3 F (36.8 C)  SpO2: 100%     Body mass index is 30.2 kg/m.   Filed Weights   10/05/18 1147  Weight: 204 lb 8 oz (92.8 kg)     ECOG FS:1 - Symptomatic but completely ambulatory   Sclerae unicteric, EOMs intact Oropharynx clear and moist No cervical or supraclavicular adenopathy Lungs no rales or rhonchi Heart regular rate and rhythm Abd soft, nontender, positive bowel sounds MSK no focal spinal tenderness, no upper extremity lymphedema Neuro: nonfocal, well oriented, appropriate affect Breasts: Status post bilateral mastectomies.  No evidence of chest wall recurrence by inspection or palpation today    Chest Wall 09/24/2018 (the right side is up on this photo)     Left Chest wall 04/22/2018    Chest wall 06/29/2018 Chest Wall 06/29/2018    LAB RESULTS:  CMP     Component Value Date/Time   NA 140 10/05/2018 1139   NA 140 10/24/2017 1307   K 4.5 10/05/2018 1139   K 5.4 (H) 10/24/2017 1307   CL 105 10/05/2018 1139   CO2 24 10/05/2018 1139   CO2 29 10/24/2017 1307   GLUCOSE 98 10/05/2018 1139   GLUCOSE 103 10/24/2017 1307   BUN 14 10/05/2018 1139   BUN 18.2 10/24/2017 1307   CREATININE 0.75 10/05/2018 1139   CREATININE 0.8 10/24/2017 1307   CALCIUM 9.6 10/05/2018 1139   CALCIUM 10.3 10/24/2017 1307   PROT 7.1 10/05/2018 1139   PROT 7.5 10/24/2017 1307   ALBUMIN 4.0 10/05/2018 1139   ALBUMIN 4.0 10/24/2017 1307   AST 24 10/05/2018 1139   AST 9 10/24/2017 1307   ALT 29 10/05/2018 1139   ALT 8 10/24/2017 1307   ALKPHOS 115 10/05/2018 1139   ALKPHOS 115 10/24/2017 1307   BILITOT 0.4 10/05/2018 1139   BILITOT 0.28 10/24/2017 1307   GFRNONAA >60 10/05/2018 1139   GFRAA >60 10/05/2018 1139    INo results found for: SPEP, UPEP  Lab Results  Component Value Date   WBC 2.1 (L) 10/05/2018   NEUTROABS 1.2 (L) 10/05/2018   HGB 11.0  (L) 10/05/2018   HCT 33.0 (L) 10/05/2018   MCV 99.1 10/05/2018   PLT 261 10/05/2018      Chemistry  Component Value Date/Time   NA 140 10/05/2018 1139   NA 140 10/24/2017 1307   K 4.5 10/05/2018 1139   K 5.4 (H) 10/24/2017 1307   CL 105 10/05/2018 1139   CO2 24 10/05/2018 1139   CO2 29 10/24/2017 1307   BUN 14 10/05/2018 1139   BUN 18.2 10/24/2017 1307   CREATININE 0.75 10/05/2018 1139   CREATININE 0.8 10/24/2017 1307      Component Value Date/Time   CALCIUM 9.6 10/05/2018 1139   CALCIUM 10.3 10/24/2017 1307   ALKPHOS 115 10/05/2018 1139   ALKPHOS 115 10/24/2017 1307   AST 24 10/05/2018 1139   AST 9 10/24/2017 1307   ALT 29 10/05/2018 1139   ALT 8 10/24/2017 1307   BILITOT 0.4 10/05/2018 1139   BILITOT 0.28 10/24/2017 1307       No results found for: LABCA2  No components found for: LABCA125  No results for input(s): INR in the last 168 hours.  Urinalysis    Component Value Date/Time   COLORURINE STRAW (A) 10/30/2017 2050   APPEARANCEUR CLEAR 10/30/2017 2050   LABSPEC 1.006 10/30/2017 2050   [place] 6.0 10/30/2017 2050   GLUCOSEU NEGATIVE 10/30/2017 2050   [place] NEGATIVE 10/30/2017 2050   [place] NEGATIVE 10/30/2017 2050   [place] 10/30/2017 2050   PROTEINUR NEGATIVE 10/30/2017 2050   NITRITE NEGATIVE 10/30/2017 2050   LEUKOCYTESUR NEGATIVE 10/30/2017 2050     STUDIES: Nm Pet Image Restag (ps) Skull Base To Thigh  Result Date: 09/23/2018 CLINICAL DATA:  Subsequent treatment strategy for recurrent left-sided breast cancer. Attention to internal mammary node. Staging. EXAM: NUCLEAR MEDICINE PET SKULL BASE TO THIGH TECHNIQUE: 10.4 mCi F-18 FDG was injected intravenously. Full-ring PET imaging was performed from the skull base to thigh after the radiotracer. CT data was obtained and used for attenuation correction and anatomic localization. Fasting blood glucose: 117 mg/dl COMPARISON:  06/26/2018 FINDINGS: Mediastinal blood pool activity:  SUV max 2.3 NECK: No areas of abnormal hypermetabolism. Incidental CT findings: No cervical adenopathy. Right maxillary sinus mucous retention cyst or polyp. Hypoplastic right frontal sinus. CHEST: Left internal mammary node measures 1.2 cm and a S.U.V. max of 3.7 versus 1.1 cm and a S.U.V. max of 2.7 on the prior. Presternal/chest wall metastasis again identified. The largest is anterior to the inferior right sternum at 8 mm and a S.U.V. max of 3.7 on image 82/4. Compare 10 mm and a S.U.V. max of 4.7 on the prior exam. The previously described right internal mammary hypermetabolic node is a longer identified. However, there are increasingly hypermetabolic mediastinal and right hilar hypermetabolic nodes. An index node within the azygoesophageal recess measures 1.5 cm and a S.U.V. max of 9.2 on image 71/4. This node measured 1.0 cm and a S.U.V. max of 3.9 on the prior. Presumed radiation induced hypermetabolic consolidation within the left upper lobe, slightly progressive. Right lower lobe subpleural pulmonary opacities are somewhat vague including a hypermetabolic density at a S.U.V. max of 5.4 on image 34/8, new. Lateral left lower lobe subpleural pulmonary opacity with hypermetabolism at a S.U.V. max of 4.3 on image 41/8, new. Incidental CT findings: Pectus excavatum deformity. Right Port-A-Cath tip low SVC. ABDOMEN/PELVIS: No abdominopelvic parenchymal or nodal hypermetabolism. Incidental CT findings: Cholecystectomy. Normal adrenal glands. Abdominal aortic atherosclerosis. Extensive colonic diverticulosis. Hysterectomy. SKELETON: New hypermetabolic sclerotic lesion within the sternum. 1.3 cm and a S.U.V. max of 4.6 on image 73/4. Incidental CT findings: Bilateral mastectomy. IMPRESSION: 1. Mild to moderate disease progression. Although some disease  sites have improved (presternal chest wall and right internal mammary nodal station), there is progressive hypermetabolic adenopathy within the mediastinum, right  hilum and a new isolated sternal osseous metastasis. 2. Bibasilar new hypermetabolic pulmonary opacities. Favor a somewhat atypical vague appearance of pulmonary metastasis. Infectious or inflammatory etiologies (i.e. Drug toxicity) could look similar. Evolving radiation induced consolidation within the subpleural left upper lobe. 3. No soft tissue metastasis identified within the neck, abdomen, or pelvis. Electronically Signed   By: Abigail Miyamoto M.D.   On: 09/23/2018 12:22     ELIGIBLE FOR AVAILABLE RESEARCH PROTOCOL: no  ASSESSMENT: 62 y.o. [name], [name] woman status post left breast upper inner quadrant biopsy 09/30/2016 for a clinical T2 N0, stage II a invasive ductal carcinoma, grade 3, estrogen receptor positive, progesterone receptor negative, HER-2 not amplified, with an MIB-1 of 80%.  (1) genetics testing pending  (2) neoadjuvant chemotherapy given between 10/30/2016 and 03/12/2017 consisting of cyclophosphamide and doxorubicin in dose dense fashion 4  followed by paclitaxel 1, then Abraxane (x8?)  further treatments discontinued because of neuropathy.  (3) status post left lumpectomy with oncoplasty and right reduction mammoplasty showing a residual T2 N1 invasive ductal carcinoma, with lymphovascular invasion, but negative margins; the tumor is now HER-2 positive.  (a) postop PET scan showed no evidence of metastatic disease.  (4) adjuvant trastuzumab/pertuzumab started 06/20/2017 (received at [facility]), started here on 07/11/2017--continued through April 2019 when patient left for [place]  (a) Echo on 06/18/2017 at [facility]: LVEF 55-60%  (b) echocardiogram 09/26/2017 shows an ejection fraction in the 60-65% range.  (c) echocardiogram 12/26/2017 shows an ejection fraction in the 55-60% range  (d) see below for subsequent echocardiogram results   (5) adjuvant radiation was to follow, but see (7) below  (6) anti-estrogens: Letrozole daily started 03/26/2017  (a) DEXA on 04/03/2017  demonstrated a T score of -1.3 in the right femoral neck  (b) Zometa given at [facility] on 06/20/2017  RECURRENCE: (7) Patient developed left inner breast nodule and underwent mammogram and ultrasound on 08/06/17 showing three areas of concern at 10 o'clock, 1130 o'clock, and at 8 o'clock. Biopsy on 08/07/2017 demonstrated at 11:30 fat necrosis, however at 10:00 it was positive for IDC, grade 3, ER+(50%), PR-(0%), Ki-67 90%, HER-2 negative (ratio 1.48).  (a) staging chest CT 08/15/2017 shows 0.5 cm left lower lobe lung nodule of uncertain significance  (8) status post left modified radical mastectomy and right simple mastectomy 08/25/2017 showing  (a) right breast, no evidence of malignancy  (b) left breast, ypT2 ypN1 invasive ductal carcinoma, grade 3, with negative margins; repeat prognostic panel: Triple negative  (9) started adjuvant chemotherapy with cyclophosphamide/methotrexate/fluorouracil [CMF] 11/10/2016, eight cycles planned  (a) continuing trastuzumab/Pertuzumab   (b) CMF chemotherapy discontinued after 4 cycles with disease progression  (10) status post left chest wall biopsy for further local recurrence 01/05/2018, the tumor being now weakly estrogen receptor positive, progesterone receptor negative, and again HER-2 negative  (11) PET scan 03/25/2018 negative except for a 1.2 cm left internal mammary lymph node, SUV 6.9  (12) adjuvant radiation completed 03/19/2018  (a) did not receive capecitabine sensitization  (13)  Letrozole resumed 03/27/2018--discontinued 06/29/2018  (14) foundation one 01/09/2018 shows no actionable mutations, with stable microsatellite status and low mutational burden.  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  (a) PD-L1 testing on immune cells, 0% staining (negative)--obtained on 08/07/2017 cycle, which was weakly ER positive, progesterone and HER-2 negative  (b) genetics testing through [facility] October 31, 2016 found no deleterious mutations in  ATM, BRCA1, BRCA2, BRI  P1, CDH 1, CH EK 2, EPCAM, MLH1, MSH2, MSH6, NBN, NF1, PALB 2, PMS2, PTEN, RAD 51C, RAD 51D, STK 1 1, or TP 53.  (15) trastuzumab and Pertuzumab resumed 03/31/2018  (a) echocardiogram on 04/20/2018 showed an ejection fraction in the 50-55% range.   (b) changed to every 4-week treatments beginning 07/01/2018 to coordinate with fulvestrant  (c) echocardiogram 08/17/2018 shows a well-preserved ejection fraction.  (d) pertuzumab discontinued as of December 2019   (16) fine-needle aspiration of a chest wall lesion on 06/04/2018 was read as "non-small cell carcinoma".  Punch biopsy obtained at the visit 06/08/2018 for prognostic panel determination showed poorly differentiated carcinoma, estrogen receptor positive at 60%, progesterone receptor negative, HER-2 negative by immunohistochemistry at 1+  (17) fulvestrant started 07/01/2018, repeat every 28 days  (a) mid September started palbociclib 125 mg daily, 21/7,   (b) discontinued with evidence of mixed response  (18) carboplatin/gemcitabine days 1 and 8 to start 09/29/2018, repeated every 21 days  (a) baseline PET scan 09/23/2018 shows progression in prior treatment, measurable disease  (b) CA-27-29 is now mildly informative    PLAN:   [name] tolerated her first cycle generally well.  She does have some fatigue, which I am afraid is going to continue.  I have encouraged her to remain as active as possible.  Her counts are borderline.  We are proceeding with treatment but adding Neulasta to day 10 of each cycle, which in this case will be 10/07/2018.  If we do not do this we will not be able to keep her on a treatment schedule with any chance of controlling her cancer  I am adding gabapentin at bedtime to see if that helps control the nighttime neuropathy problems  I reviewed her genetics results with her.  They do not show a mutation in BRCA in particular or any other mutation that might explain her cancer.  We are canceling the genetics  testing scheduled for 10/08/2018.  I spent approximately 30 minutes face to face with [name] with more than 50% of that time spent in counseling and coordination of care.  I have encouraged her to call us with any problems that may develop before her return visit here.   Tyler Robidoux, [name], MD  10/05/18 4:53 PM Medical Oncology and Hematology [facility] [address] [place], [place] [address] Tel. [phone]    Fax. [phone]   I, [name] am acting as a Education administrator for Chauncey Cruel, MD.   I, Lurline Del MD, have reviewed the above documentation for accuracy and completeness, and I agree with the above.

## 2018-10-05 ENCOUNTER — Inpatient Hospital Stay: Payer: BLUE CROSS/BLUE SHIELD

## 2018-10-05 ENCOUNTER — Inpatient Hospital Stay (HOSPITAL_BASED_OUTPATIENT_CLINIC_OR_DEPARTMENT_OTHER): Payer: BLUE CROSS/BLUE SHIELD | Admitting: Oncology

## 2018-10-05 ENCOUNTER — Telehealth: Payer: Self-pay | Admitting: Oncology

## 2018-10-05 ENCOUNTER — Other Ambulatory Visit: Payer: Self-pay | Admitting: Oncology

## 2018-10-05 ENCOUNTER — Telehealth: Payer: Self-pay | Admitting: *Deleted

## 2018-10-05 ENCOUNTER — Other Ambulatory Visit: Payer: Self-pay | Admitting: *Deleted

## 2018-10-05 VITALS — BP 154/70 | HR 97 | Temp 98.3°F | Resp 18 | Ht 69.0 in | Wt 204.5 lb

## 2018-10-05 DIAGNOSIS — C50212 Malignant neoplasm of upper-inner quadrant of left female breast: Secondary | ICD-10-CM

## 2018-10-05 DIAGNOSIS — C50912 Malignant neoplasm of unspecified site of left female breast: Secondary | ICD-10-CM

## 2018-10-05 DIAGNOSIS — G629 Polyneuropathy, unspecified: Secondary | ICD-10-CM

## 2018-10-05 DIAGNOSIS — Z170 Estrogen receptor positive status [ER+]: Principal | ICD-10-CM

## 2018-10-05 DIAGNOSIS — Z5112 Encounter for antineoplastic immunotherapy: Secondary | ICD-10-CM | POA: Diagnosis not present

## 2018-10-05 DIAGNOSIS — C7951 Secondary malignant neoplasm of bone: Secondary | ICD-10-CM

## 2018-10-05 DIAGNOSIS — Z95828 Presence of other vascular implants and grafts: Secondary | ICD-10-CM

## 2018-10-05 DIAGNOSIS — R5383 Other fatigue: Secondary | ICD-10-CM | POA: Diagnosis not present

## 2018-10-05 DIAGNOSIS — T451X5A Adverse effect of antineoplastic and immunosuppressive drugs, initial encounter: Secondary | ICD-10-CM

## 2018-10-05 DIAGNOSIS — D701 Agranulocytosis secondary to cancer chemotherapy: Secondary | ICD-10-CM | POA: Insufficient documentation

## 2018-10-05 LAB — CBC WITH DIFFERENTIAL/PLATELET
ABS IMMATURE GRANULOCYTES: 0.01 10*3/uL (ref 0.00–0.07)
Basophils Absolute: 0.1 10*3/uL (ref 0.0–0.1)
Basophils Relative: 4 %
Eosinophils Absolute: 0.1 10*3/uL (ref 0.0–0.5)
Eosinophils Relative: 2 %
HCT: 33 % — ABNORMAL LOW (ref 36.0–46.0)
Hemoglobin: 11 g/dL — ABNORMAL LOW (ref 12.0–15.0)
Immature Granulocytes: 1 %
LYMPHS PCT: 31 %
Lymphs Abs: 0.7 10*3/uL (ref 0.7–4.0)
MCH: 33 pg (ref 26.0–34.0)
MCHC: 33.3 g/dL (ref 30.0–36.0)
MCV: 99.1 fL (ref 80.0–100.0)
Monocytes Absolute: 0.1 10*3/uL (ref 0.1–1.0)
Monocytes Relative: 5 %
Neutro Abs: 1.2 10*3/uL — ABNORMAL LOW (ref 1.7–7.7)
Neutrophils Relative %: 57 %
Platelets: 261 10*3/uL (ref 150–400)
RBC: 3.33 MIL/uL — ABNORMAL LOW (ref 3.87–5.11)
RDW: 17.1 % — AB (ref 11.5–15.5)
WBC: 2.1 10*3/uL — ABNORMAL LOW (ref 4.0–10.5)
nRBC: 0 % (ref 0.0–0.2)

## 2018-10-05 LAB — COMPREHENSIVE METABOLIC PANEL
ALT: 29 U/L (ref 0–44)
AST: 24 U/L (ref 15–41)
Albumin: 4 g/dL (ref 3.5–5.0)
Alkaline Phosphatase: 115 U/L (ref 38–126)
Anion gap: 11 (ref 5–15)
BUN: 14 mg/dL (ref 8–23)
CO2: 24 mmol/L (ref 22–32)
Calcium: 9.6 mg/dL (ref 8.9–10.3)
Chloride: 105 mmol/L (ref 98–111)
Creatinine, Ser: 0.75 mg/dL (ref 0.44–1.00)
GFR calc Af Amer: >60 mL/min (ref >60)
GFR calc non Af Amer: >60 mL/min (ref >60)
Glucose, Bld: 98 mg/dL (ref 70–99)
Potassium: 4.5 mmol/L (ref 3.5–5.1)
Sodium: 140 mmol/L (ref 135–145)
Total Bilirubin: 0.4 mg/dL (ref 0.3–1.2)
Total Protein: 7.1 g/dL (ref 6.5–8.1)

## 2018-10-05 MED ORDER — SODIUM CHLORIDE 0.9 % IV SOLN
268.4000 mg | Freq: Once | INTRAVENOUS | Status: AC
  Administered 2018-10-05: 270 mg via INTRAVENOUS
  Filled 2018-10-05: qty 27

## 2018-10-05 MED ORDER — PALONOSETRON HCL INJECTION 0.25 MG/5ML
INTRAVENOUS | Status: AC
  Filled 2018-10-05: qty 5

## 2018-10-05 MED ORDER — DEXAMETHASONE SODIUM PHOSPHATE 10 MG/ML IJ SOLN
INTRAMUSCULAR | Status: AC
  Filled 2018-10-05: qty 1

## 2018-10-05 MED ORDER — SODIUM CHLORIDE 0.9% FLUSH
10.0000 mL | INTRAVENOUS | Status: DC | PRN
  Administered 2018-10-05: 10 mL
  Filled 2018-10-05: qty 10

## 2018-10-05 MED ORDER — GABAPENTIN 300 MG PO CAPS: 300.0000 mg | ORAL_CAPSULE | Freq: Every day | ORAL | 4 refills | Status: DC

## 2018-10-05 MED ORDER — SODIUM CHLORIDE 0.9 % IV SOLN
Freq: Once | INTRAVENOUS | Status: DC
  Filled 2018-10-05: qty 250

## 2018-10-05 MED ORDER — PALONOSETRON HCL INJECTION 0.25 MG/5ML
0.2500 mg | Freq: Once | INTRAVENOUS | Status: AC
  Administered 2018-10-05: 0.25 mg via INTRAVENOUS

## 2018-10-05 MED ORDER — DEXAMETHASONE SODIUM PHOSPHATE 10 MG/ML IJ SOLN
10.0000 mg | Freq: Once | INTRAMUSCULAR | Status: AC
  Administered 2018-10-05: 10 mg via INTRAVENOUS

## 2018-10-05 MED ORDER — SODIUM CHLORIDE 0.9% FLUSH
10.0000 mL | Freq: Once | INTRAVENOUS | Status: AC
  Administered 2018-10-05: 10 mL
  Filled 2018-10-05: qty 10

## 2018-10-05 MED ORDER — SODIUM CHLORIDE 0.9 % IV SOLN
2000.0000 mg | Freq: Once | INTRAVENOUS | Status: AC
  Administered 2018-10-05: 2000 mg via INTRAVENOUS
  Filled 2018-10-05: qty 52.6

## 2018-10-05 MED ORDER — SODIUM CHLORIDE 0.9 % IV SOLN
Freq: Once | INTRAVENOUS | Status: AC
  Administered 2018-10-05: 13:00:00 via INTRAVENOUS
  Filled 2018-10-05: qty 250

## 2018-10-05 MED ORDER — HEPARIN SOD (PORK) LOCK FLUSH 100 UNIT/ML IV SOLN
500.0000 [IU] | Freq: Once | INTRAVENOUS | Status: AC | PRN
  Administered 2018-10-05: 500 [IU]
  Filled 2018-10-05: qty 5

## 2018-10-05 NOTE — Patient Instructions (Signed)
[facility] Discharge Instructions for Patients Receiving Chemotherapy  Today you received the following chemotherapy agents:  Gemzar, Carboplatin  To help prevent nausea and vomiting after your treatment, we encourage you to take your nausea medication as prescribed.   If you develop nausea and vomiting that is not controlled by your nausea medication, call the clinic.   BELOW ARE SYMPTOMS THAT SHOULD BE REPORTED IMMEDIATELY:  *FEVER GREATER THAN 100.5 F  *CHILLS WITH OR WITHOUT FEVER  NAUSEA AND VOMITING THAT IS NOT CONTROLLED WITH YOUR NAUSEA MEDICATION  *UNUSUAL SHORTNESS OF BREATH  *UNUSUAL BRUISING OR BLEEDING  TENDERNESS IN MOUTH AND THROAT WITH OR WITHOUT PRESENCE OF ULCERS  *URINARY PROBLEMS  *BOWEL PROBLEMS  UNUSUAL RASH Items with * indicate a potential emergency and should be followed up as soon as possible.  Feel free to call the clinic should you have any questions or concerns. The clinic phone number is [phone].  Please show the CHEMO ALERT CARD at check-in to the Emergency Department and triage nurse.   

## 2018-10-05 NOTE — Telephone Encounter (Signed)
Gave avs and calendar ° °

## 2018-10-05 NOTE — Telephone Encounter (Signed)
Ok to treat today per labs- MD placed order for neulasta/udenyka support 10/07/2018.

## 2018-10-06 ENCOUNTER — Other Ambulatory Visit: Payer: Self-pay | Admitting: Oncology

## 2018-10-07 ENCOUNTER — Other Ambulatory Visit: Payer: Self-pay | Admitting: Oncology

## 2018-10-07 ENCOUNTER — Inpatient Hospital Stay: Payer: BLUE CROSS/BLUE SHIELD

## 2018-10-07 DIAGNOSIS — C50212 Malignant neoplasm of upper-inner quadrant of left female breast: Secondary | ICD-10-CM

## 2018-10-07 DIAGNOSIS — Z170 Estrogen receptor positive status [ER+]: Principal | ICD-10-CM

## 2018-10-07 DIAGNOSIS — Z95828 Presence of other vascular implants and grafts: Secondary | ICD-10-CM

## 2018-10-07 DIAGNOSIS — C50912 Malignant neoplasm of unspecified site of left female breast: Secondary | ICD-10-CM

## 2018-10-07 DIAGNOSIS — Z5112 Encounter for antineoplastic immunotherapy: Secondary | ICD-10-CM | POA: Diagnosis not present

## 2018-10-07 MED ORDER — PEGFILGRASTIM-CBQV 6 MG/0.6ML ~~LOC~~ SOSY
6.0000 mg | PREFILLED_SYRINGE | Freq: Once | SUBCUTANEOUS | Status: AC
  Administered 2018-10-07: 6 mg via SUBCUTANEOUS

## 2018-10-07 MED ORDER — TBO-FILGRASTIM 300 MCG/0.5ML ~~LOC~~ SOSY: 300.0000 ug | PREFILLED_SYRINGE | Freq: Once | SUBCUTANEOUS | Status: DC

## 2018-10-07 MED ORDER — PEGFILGRASTIM-CBQV 6 MG/0.6ML ~~LOC~~ SOSY
PREFILLED_SYRINGE | SUBCUTANEOUS | Status: AC
  Filled 2018-10-07: qty 0.6

## 2018-10-08 ENCOUNTER — Inpatient Hospital Stay: Payer: BLUE CROSS/BLUE SHIELD | Admitting: Licensed Clinical Social Worker

## 2018-10-08 ENCOUNTER — Inpatient Hospital Stay: Payer: BLUE CROSS/BLUE SHIELD

## 2018-10-13 ENCOUNTER — Other Ambulatory Visit: Payer: BLUE CROSS/BLUE SHIELD

## 2018-10-13 ENCOUNTER — Ambulatory Visit: Payer: BLUE CROSS/BLUE SHIELD

## 2018-10-17 NOTE — Progress Notes (Signed)
Debra [name]  [phone] [phone]     ID: Debra [name] DOB: [birth year]  MR#: [id number]  PJA#:[id number]  Patient Care Team: Debra Noon, MD as PCP - General (Family Medicine) Debra Seltzer, MD as Consulting Physician (General Surgery) Debra [name], Debra Dad, MD as Consulting Physician (Oncology) Debra Pray, MD as Consulting Physician (Radiation Oncology) Debra Gobble, MD as Consulting Physician (Pulmonary Disease) Debra Carmine, MD as Referring Physician (Internal Medicine) Debra Dresser, MD as Consulting Physician (Cardiology) Debra Pace, MD as Referring Physician (Surgical Oncology) OTHER MD: Debra [name] [phone])   CHIEF COMPLAINT: estrogen receptor positive, HER-2 positive breast cancer; triple negative breast cancers   CURRENT TREATMENT: Adjuvant Trastuzumab,  zolendronate, carboplatin/gemcitabine   INTERVAL HISTORY: Debra [name] returns today for follow-up of her triple negative breast cancer.   She continues on chemotherapy, consisting of carboplatin/gemcitabine, which she receives day 1 and day 8 of each cycle. Today is day 1 cycle 2.   The patient also continues on trastuzumab, which she is tolerating well.    Most recent echocardiogram was 08/17/2018, with a good ejection fraction.  Since her last visit here on 10/05/2018, she has not undergone any studies.  She received Udenyca on 10/07/2018. She did fine with the shot.    REVIEW OF SYSTEMS: Debra [name] says that she is not feeling well.  She spent time with friends and family for [name], and admits that she may have overdone it, but that she wanted to do everything she did.  She had several diarrheal bowel movements for which she used Imodium.  She had her first normal bowel movement yesterday, 10/18/2018 since her treatment; previously she had either lose stool or diarrhea.   She believes that she is getting a sinus infection and has been  taking DayQuil and NyQuil with some relief.  He denies fevers.  She states that she has a bruise on her right foot and toes, which she believes may have come from either a foot massager or crawling around in the floor with her grandchild. The patient denies unusual headaches, visual changes, nausea, vomiting, or dizziness. There has been no unusual cough, phlegm production, or pleurisy. This been no change in bladder habits. The patient denies unexplained fatigue or unexplained weight loss, bleeding, rash, or fever. A detailed review of systems was otherwise noncontributory.    BREAST CANCER HISTORY: From the original intake note:  "Debra [name]" had a fall while traveling living to [place] in their RV and injure her left upper arm and  her left breast . Approximately 3 weeks later she noted a mass in the left breast upper inner quadrant. It was somewhat tender. She brought this to medical attention and on 09/27/2016 underwent bilateral diagnostic mammography with tomography [facility] The breast density was category B. The right breast was benign. In the left breast upper inner quadrant there was a large lobulated hyperdense mass which by exam was firm and nonmobile. By ultrasonography in the 11:30 o'clock radius 4 cm from the nipple there was a mass extending to the subcutaneous tissues and measuring 4.9 cm. There was a small superficial fluid collection overlying this consistent with a hematoma. The left axilla was benign.  Biopsy of the left breast mass in question 09/30/2016 showed (SAA 17-02/04/2003) and invasive ductal carcinoma grade 3, estrogen receptor 80% positive with moderate staining intensity, progesterone receptor negative, MIB-1 of 80%, and no HER-2 amplification, the signals ratio being 1.49 and the number  per cell 2.60.  The patient's subsequent history is as detailed below   PAST MEDICAL HISTORY: Past Medical History:  Diagnosis Date  . Anxiety     . CAP (community acquired pneumonia)   . Depression   . Malignant neoplasm of upper-inner quadrant of left female breast (Debra [name]) 10/01/2016  . Personal history of chemotherapy   . Tobacco use     PAST SURGICAL HISTORY: Past Surgical History:  Procedure Laterality Date  . ABDOMINAL HYSTERECTOMY  2011   Laparoscopic  . AUGMENTATION MAMMAPLASTY Bilateral 03/2017  . BREAST LUMPECTOMY Left 04/05/2017  . BREAST LUMPECTOMY WITH SENTINEL LYMPH NODE BIOPSY Left 04/07/2017  . BREAST REDUCTION SURGERY Left 04/07/2017  . CHOLECYSTECTOMY    . MASTECTOMY Bilateral 08/25/2017   at [facility] [place]  . open reduction left ankle    . SKIN GRAFT Left 06/19/2017   bil skin grafts to breasts    FAMILY HISTORY Family History  Problem Relation Age of Onset  . Heart disease Mother   . Heart disease Father   . Cancer Sister        ovarian ca   The patient's father died at the age of 60 from a myocardial infarction. The patient's mother died at the age of 80 with congestive heart failure. The patient had one brother, 2 sisters. One of her sisters was diagnosed with ovarian cancer at age 50 and died at age 33 from that disease   GYNECOLOGIC HISTORY:  No LMP recorded. Patient has had a hysterectomy. Menarche age 57, first live birth age 89. The patient is GX P1. She stopped having periods approximately 2008. She did not take hormone replacement. She used oral contraceptives for approximately 30 years with no complications.   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 Her husband Debra [name] is retired from [employer]. Their daughter Debra [name] lives in [place] and is Debra [name] of the [employer] life in [employer] and her husband works as an Sales executive at [employer]. The patient welcomed her 1st grandchild, a boy named Debra [name] in May 2019.      ADVANCED DIRECTIVES: not in place   HEALTH MAINTENANCE: Social History   Tobacco Use  .  Smoking status: Former Smoker    Packs/day: 0.50    Years: 20.00    Pack years: 10.00    Last attempt to quit: 10/21/2016    Years since quitting: 1.9  . Smokeless tobacco: Never Used  Substance Use Topics  . Alcohol use: No  . Drug use: No     Colonoscopy:  PAP:  Bone density:   No Known Allergies  Current Outpatient Medications  Medication Sig Dispense Refill  . acetaminophen (TYLENOL) 325 MG tablet Take 650 mg by mouth every 6 (six) hours as needed for moderate pain.     Marland Kitchen ALPRAZolam (XANAX XR) 1 MG 24 hr tablet TAKE 1 TABLET BY MOUTH EVERY DAY 30 tablet 1  . busPIRone (BUSPAR) 30 MG tablet Take 1 tablet (30 mg total) by mouth 3 (three) times daily. 90 tablet 4  . diphenhydrAMINE (BENADRYL) 25 MG tablet Take 50 mg by mouth as needed for sleep.     Marland Kitchen gabapentin (NEURONTIN) 300 MG capsule Take 1 capsule (300 mg total) by mouth at bedtime. 90 capsule 4  . ibuprofen (ADVIL,MOTRIN) 200 MG tablet Take 600 mg by mouth every 6 (six) hours as needed for moderate pain.     Marland Kitchen  ondansetron (ZOFRAN) 8 MG tablet Take 1 tablet (8 mg total) by mouth 2 (two) times daily as needed for refractory nausea / vomiting. Start on day 3 after chemotherapy. 30 tablet 1  . PARoxetine (PAXIL) 40 MG tablet Take 40 mg by mouth every morning.    . prochlorperazine (COMPAZINE) 10 MG tablet Take 1 tablet (10 mg total) by mouth every 6 (six) hours as needed (Nausea or vomiting). 30 tablet 1  . ranitidine (ZANTAC) 150 MG tablet Take 150 mg by mouth daily as needed for heartburn.     Marland Kitchen tiZANidine (ZANAFLEX) 4 MG tablet TAKE 1 TABLET BY MOUTH Bedtime 30 tablet 1  . valACYclovir (VALTREX) 1000 MG tablet Take 1 tablet (1,000 mg total) by mouth daily. 90 tablet 2   No current facility-administered medications for this visit.      OBJECTIVE: Middle-aged white woman in no acute distress  Vitals:   10/19/18 0802  BP: (!) 171/69  Pulse: 99  Resp: 17  Temp: 98.1 F (36.7 C)  SpO2: 100%     Body mass index is 30.95  kg/m.   Filed Weights   10/19/18 0802  Weight: 209 lb 9.6 oz (95.1 kg)     ECOG FS:1 - Symptomatic but completely ambulatory   Surprisingly she has not yet begun to lose her hair Sclerae unicteric, pupils round and equal No cervical or supraclavicular adenopathy Lungs no rales or rhonchi Heart regular rate and rhythm Abd soft, nontender, positive bowel sounds MSK no focal spinal tenderness; the right foot is bluish and there is a bruise in the lateral aspect of the right big toe.  There actually is no ankle swelling.  There is no tenderness to palpation.  I have difficulty palpating peripheral pulses in either foot.  The left foot is unremarkable Neuro: nonfocal, well oriented, appropriate affect Breasts: Status post bilateral mastectomies.  In the photo below, which shows the central left chest, there are 2 3 cm subcutaneous erythematous lesions which require follow-up  Photo 10/19/2018     Chest Wall 09/24/2018 (the right side is up on this photo)     Left Chest wall 04/22/2018    Chest wall 06/29/2018 Chest Wall 06/29/2018    LAB RESULTS:  CMP     Component Value Date/Time   NA 140 10/05/2018 1139   NA 140 10/24/2017 1307   K 4.5 10/05/2018 1139   K 5.4 (H) 10/24/2017 1307   CL 105 10/05/2018 1139   CO2 24 10/05/2018 1139   CO2 29 10/24/2017 1307   GLUCOSE 98 10/05/2018 1139   GLUCOSE 103 10/24/2017 1307   BUN 14 10/05/2018 1139   BUN 18.2 10/24/2017 1307   CREATININE 0.75 10/05/2018 1139   CREATININE 0.8 10/24/2017 1307   CALCIUM 9.6 10/05/2018 1139   CALCIUM 10.3 10/24/2017 1307   PROT 7.1 10/05/2018 1139   PROT 7.5 10/24/2017 1307   ALBUMIN 4.0 10/05/2018 1139   ALBUMIN 4.0 10/24/2017 1307   AST 24 10/05/2018 1139   AST 9 10/24/2017 1307   ALT 29 10/05/2018 1139   ALT 8 10/24/2017 1307   ALKPHOS 115 10/05/2018 1139   ALKPHOS 115 10/24/2017 1307   BILITOT 0.4 10/05/2018 1139   BILITOT 0.28 10/24/2017 1307   GFRNONAA >60 10/05/2018 1139    GFRAA >60 10/05/2018 1139    INo results found for: SPEP, UPEP  Lab Results  Component Value Date   WBC 2.1 (L) 10/05/2018   NEUTROABS 1.2 (L) 10/05/2018   HGB 11.0 (L)  10/05/2018   HCT 33.0 (L) 10/05/2018   MCV 99.1 10/05/2018   PLT 261 10/05/2018      Chemistry      Component Value Date/Time   NA 140 10/05/2018 1139   NA 140 10/24/2017 1307   K 4.5 10/05/2018 1139   K 5.4 (H) 10/24/2017 1307   CL 105 10/05/2018 1139   CO2 24 10/05/2018 1139   CO2 29 10/24/2017 1307   BUN 14 10/05/2018 1139   BUN 18.2 10/24/2017 1307   CREATININE 0.75 10/05/2018 1139   CREATININE 0.8 10/24/2017 1307      Component Value Date/Time   CALCIUM 9.6 10/05/2018 1139   CALCIUM 10.3 10/24/2017 1307   ALKPHOS 115 10/05/2018 1139   ALKPHOS 115 10/24/2017 1307   AST 24 10/05/2018 1139   AST 9 10/24/2017 1307   ALT 29 10/05/2018 1139   ALT 8 10/24/2017 1307   BILITOT 0.4 10/05/2018 1139   BILITOT 0.28 10/24/2017 1307       No results found for: LABCA2  No components found for: LABCA125  No results for input(s): INR in the last 168 hours.  Urinalysis    Component Value Date/Time   COLORURINE STRAW (A) 10/30/2017 2050   APPEARANCEUR CLEAR 10/30/2017 2050   LABSPEC 1.006 10/30/2017 2050   [place] 6.0 10/30/2017 2050   GLUCOSEU NEGATIVE 10/30/2017 2050   [place] NEGATIVE 10/30/2017 2050   [place] NEGATIVE 10/30/2017 2050   [place] 10/30/2017 2050   PROTEINUR NEGATIVE 10/30/2017 2050   NITRITE NEGATIVE 10/30/2017 2050   LEUKOCYTESUR NEGATIVE 10/30/2017 2050     STUDIES: Nm Pet Image Restag (ps) Skull Base To Thigh  Result Date: 09/23/2018 CLINICAL DATA:  Subsequent treatment strategy for recurrent left-sided breast cancer. Attention to internal mammary node. Staging. EXAM: NUCLEAR MEDICINE PET SKULL BASE TO THIGH TECHNIQUE: 10.4 mCi F-18 FDG was injected intravenously. Full-ring PET imaging was performed from the skull base to thigh after the radiotracer. CT data was  obtained and used for attenuation correction and anatomic localization. Fasting blood glucose: 117 mg/dl COMPARISON:  06/26/2018 FINDINGS: Mediastinal blood pool activity: SUV max 2.3 NECK: No areas of abnormal hypermetabolism. Incidental CT findings: No cervical adenopathy. Right maxillary sinus mucous retention cyst or polyp. Hypoplastic right frontal sinus. CHEST: Left internal mammary node measures 1.2 cm and a S.U.V. max of 3.7 versus 1.1 cm and a S.U.V. max of 2.7 on the prior. Presternal/chest wall metastasis again identified. The largest is anterior to the inferior right sternum at 8 mm and a S.U.V. max of 3.7 on image 82/4. Compare 10 mm and a S.U.V. max of 4.7 on the prior exam. The previously described right internal mammary hypermetabolic node is a longer identified. However, there are increasingly hypermetabolic mediastinal and right hilar hypermetabolic nodes. An index node within the azygoesophageal recess measures 1.5 cm and a S.U.V. max of 9.2 on image 71/4. This node measured 1.0 cm and a S.U.V. max of 3.9 on the prior. Presumed radiation induced hypermetabolic consolidation within the left upper lobe, slightly progressive. Right lower lobe subpleural pulmonary opacities are somewhat vague including a hypermetabolic density at a S.U.V. max of 5.4 on image 34/8, new. Lateral left lower lobe subpleural pulmonary opacity with hypermetabolism at a S.U.V. max of 4.3 on image 41/8, new. Incidental CT findings: Pectus excavatum deformity. Right Port-A-Cath tip low SVC. ABDOMEN/PELVIS: No abdominopelvic parenchymal or nodal hypermetabolism. Incidental CT findings: Cholecystectomy. Normal adrenal glands. Abdominal aortic atherosclerosis. Extensive colonic diverticulosis. Hysterectomy. SKELETON: New hypermetabolic sclerotic lesion within  the sternum. 1.3 cm and a S.U.V. max of 4.6 on image 73/4. Incidental CT findings: Bilateral mastectomy. IMPRESSION: 1. Mild to moderate disease progression. Although some  disease sites have improved (presternal chest wall and right internal mammary nodal station), there is progressive hypermetabolic adenopathy within the mediastinum, right hilum and a new isolated sternal osseous metastasis. 2. Bibasilar new hypermetabolic pulmonary opacities. Favor a somewhat atypical vague appearance of pulmonary metastasis. Infectious or inflammatory etiologies (i.e. Drug toxicity) could look similar. Evolving radiation induced consolidation within the subpleural left upper lobe. 3. No soft tissue metastasis identified within the neck, abdomen, or pelvis. Electronically Signed   By: Abigail Miyamoto M.D.   On: 09/23/2018 12:22    ELIGIBLE FOR AVAILABLE RESEARCH PROTOCOL: no   ASSESSMENT: 62 y.o. [employer], [place] woman status post left breast upper inner quadrant biopsy 09/30/2016 for a clinical T2 N0, stage II a invasive ductal carcinoma, grade 3, estrogen receptor positive, progesterone receptor negative, HER-2 not amplified, with an MIB-1 of 80%.  (1) genetics testing pending  (2) neoadjuvant chemotherapy given between 10/30/2016 and 03/12/2017 consisting of cyclophosphamide and doxorubicin in dose dense fashion 4  followed by paclitaxel 1, then Abraxane (x8?)  further treatments discontinued because of neuropathy.  (3) status post left lumpectomy with oncoplasty and right reduction mammoplasty showing a residual T2 N1 invasive ductal carcinoma, with lymphovascular invasion, but negative margins; the tumor is now HER-2 positive.  (a) postop PET scan showed no evidence of metastatic disease.  (4) adjuvant trastuzumab/pertuzumab started 06/20/2017 (received at [facility]), started here on 07/11/2017--continued through April 2019 when patient left for [place]  (a) Echo on 06/18/2017 at [facility]: LVEF 55-60%  (b) echocardiogram 09/26/2017 shows an ejection fraction in the 60-65% range.  (c) echocardiogram 12/26/2017 shows an ejection fraction in the 55-60% range  (d) see below for subsequent  echocardiogram results   (5) adjuvant radiation was to follow, but see (7) below  (6) anti-estrogens: Letrozole daily started 03/26/2017  (a) DEXA on 04/03/2017 demonstrated a T score of -1.3 in the right femoral neck  (b) Zometa given at [facility] on 06/20/2017  RECURRENCE: (7) Patient developed left inner breast nodule and underwent mammogram and ultrasound on 08/06/17 showing three areas of concern at 10 o'clock, 1130 o'clock, and at 8 o'clock. Biopsy on 08/07/2017 demonstrated at 11:30 fat necrosis, however at 10:00 it was positive for IDC, grade 3, ER+(50%), PR-(0%), Ki-67 90%, HER-2 negative (ratio 1.48).  (a) staging chest CT 08/15/2017 shows 0.5 cm left lower lobe lung nodule of uncertain significance  (8) status post left modified radical mastectomy and right simple mastectomy 08/25/2017 showing  (a) right breast, no evidence of malignancy  (b) left breast, ypT2 ypN1 invasive ductal carcinoma, grade 3, with negative margins; repeat prognostic panel: Triple negative  (9) started adjuvant chemotherapy with cyclophosphamide/methotrexate/fluorouracil [CMF] 11/10/2016, eight cycles planned  (a) continuing trastuzumab/Pertuzumab   (b) CMF chemotherapy discontinued after 4 cycles with disease progression  (10) status post left chest wall biopsy for further local recurrence 01/05/2018, the tumor being now weakly estrogen receptor positive, progesterone receptor negative, and again HER-2 negative  (11) PET scan 03/25/2018 negative except for a 1.2 cm left internal mammary lymph node, SUV 6.9  (12) adjuvant radiation completed 03/19/2018  (a) did not receive capecitabine sensitization  (13)  Letrozole resumed 03/27/2018--discontinued 06/29/2018  (14) foundation one 01/09/2018 shows no actionable mutations, with stable microsatellite status and low mutational burden.  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 (a) PD-L1 testing on immune cells, 0% staining (  negative)--obtained on 08/07/2017 cycle, which was  weakly ER positive, progesterone and HER-2 negative (b) genetics testing through [facility] October 31, 2016 found no deleterious mutations in ATM, BRCA1, BRCA2, BRI P1, CDH 1, CH EK 2, EPCAM, MLH1, MSH2, MSH6, NBN, NF1, PALB 2, PMS2, PTEN, RAD 51C, RAD 51D, STK 1 1, or TP 53.  (15) trastuzumab and Pertuzumab resumed 03/31/2018 (a) echocardiogram on 04/20/2018 showed an ejection fraction in the 50-55% range.  (b) changed to every 4-week treatments beginning 07/01/2018 to coordinate with fulvestrant (c) echocardiogram 08/17/2018 shows a well-preserved ejection fraction. (d) pertuzumab discontinued as of December 2019  (16) fine-needle aspiration of a chest wall lesion on 06/04/2018 was read as "non-small cell carcinoma".  Punch biopsy obtained at the visit 06/08/2018 for prognostic panel determination showed poorly differentiated carcinoma, estrogen receptor positive at 60%, progesterone receptor negative, HER-2 negative by immunohistochemistry at 1+  (17) fulvestrant started 07/01/2018, repeat every 28 days  (a) mid September started palbociclib 125 mg daily, 21/7,   (b) discontinued with evidence of mixed response  (18) carboplatin/gemcitabine days 1 and 8 to start 09/29/2018, repeated every 21 days (a) baseline PET scan 09/23/2018 shows progression in prior treatment, measurable disease (b) CA-27-29 is now mildly informative    PLAN:   Debra [name] very much would like to take a week off.  She does not feel well.  Certainly we could push on and treat but I am concerned she might not do well and have increased side effects from the treatment so the better part of valor in this case is to wait a week and then resume treatment as before.  Perhaps more importantly she is rethinking the entire treatment plan.  She is asking "is this the way it is going to be?"  What she means it is a mile was going to feel bad and the answer unfortunately for triple negative disease is that patients do receive chemotherapy  pretty much all the time with some breaks.  We discussed the possibility of her going to [facility] to discuss her case and to consider whether she qualifies for studies, but it would be very difficult for her to fit in a study protocol since she has estrogen positive HER-2 positive and triple negative disease all at once.  She wanted to know what would happen if she was not treated.  I do think since she does not have visceral disease she is not a hospice candidate.  Very likely she would still be around a year from now, but she could be having significant chest wall problems by then.  She has had ulcers and chest wall pain before so she knows what I am talking about  I urged her to get through 2 more cycles of the current chemo since that will give her important information.  If she does not respond to this chemo I would be more in favor of moving to a supportive care stance, but on the other hand if she does have a response then even if she stops treatment for a while we know we can come back to the schema later on  I do think what she has in the right foot is trauma and not vascular.  I expect this will be considerably better by next week.  Needless to say she should stay off that foot massager  She will return to see me in 1 week.  She knows to call for any other issues that may develop before then.  Debra [name], Debra Dad, MD  10/19/18 8:35 AM Medical Oncology and Hematology [facility] [address] [place], [place] [address] Tel. [phone]    Fax. [phone]   I, Jacqualyn Posey am acting as a Education administrator for Chauncey Cruel, MD.   I, Lurline Del MD, have reviewed the above documentation for accuracy and completeness, and I agree with the above.

## 2018-10-19 ENCOUNTER — Inpatient Hospital Stay (HOSPITAL_BASED_OUTPATIENT_CLINIC_OR_DEPARTMENT_OTHER): Payer: BLUE CROSS/BLUE SHIELD | Admitting: Oncology

## 2018-10-19 ENCOUNTER — Telehealth: Payer: Self-pay | Admitting: Oncology

## 2018-10-19 ENCOUNTER — Ambulatory Visit: Payer: BLUE CROSS/BLUE SHIELD

## 2018-10-19 ENCOUNTER — Inpatient Hospital Stay: Payer: BLUE CROSS/BLUE SHIELD

## 2018-10-19 ENCOUNTER — Other Ambulatory Visit: Payer: BLUE CROSS/BLUE SHIELD

## 2018-10-19 VITALS — BP 171/69 | HR 99 | Temp 98.1°F | Resp 17 | Ht 69.0 in | Wt 209.6 lb

## 2018-10-19 DIAGNOSIS — C7951 Secondary malignant neoplasm of bone: Secondary | ICD-10-CM

## 2018-10-19 DIAGNOSIS — Z170 Estrogen receptor positive status [ER+]: Secondary | ICD-10-CM

## 2018-10-19 DIAGNOSIS — G629 Polyneuropathy, unspecified: Secondary | ICD-10-CM

## 2018-10-19 DIAGNOSIS — C50212 Malignant neoplasm of upper-inner quadrant of left female breast: Secondary | ICD-10-CM | POA: Diagnosis not present

## 2018-10-19 DIAGNOSIS — R5383 Other fatigue: Secondary | ICD-10-CM | POA: Diagnosis not present

## 2018-10-19 DIAGNOSIS — C50912 Malignant neoplasm of unspecified site of left female breast: Secondary | ICD-10-CM

## 2018-10-19 MED ORDER — SODIUM CHLORIDE 0.9% FLUSH
10.0000 mL | Freq: Once | INTRAVENOUS | Status: AC
  Filled 2018-10-19: qty 10

## 2018-10-19 NOTE — Telephone Encounter (Signed)
Printed calendar and avs. °

## 2018-10-20 ENCOUNTER — Other Ambulatory Visit: Payer: Self-pay | Admitting: Oncology

## 2018-10-22 ENCOUNTER — Other Ambulatory Visit: Payer: BLUE CROSS/BLUE SHIELD

## 2018-10-22 ENCOUNTER — Ambulatory Visit: Payer: BLUE CROSS/BLUE SHIELD

## 2018-10-25 NOTE — Progress Notes (Signed)
[place]  [phone] [phone]     ID: [name] DOB: 1956/06/03  MR#: [id number]  SHF#:[id number]  Patient Care Team: Chesley Noon, MD as PCP - General (Family Medicine) Excell Seltzer, MD as Consulting Physician (General Surgery) Anders Hohmann, [name], MD as Consulting Physician (Oncology) Gery Pray, MD as Consulting Physician (Radiation Oncology) Collene Gobble, MD as Consulting Physician (Pulmonary Disease) Verdell Carmine, MD as Referring Physician (Internal Medicine) Larey Dresser, MD as Consulting Physician (Cardiology) OTHER MD: Dr Lyda Jester [phone])   CHIEF COMPLAINT: estrogen receptor positive, HER-2 positive breast cancer; triple negative breast cancers   CURRENT TREATMENT: Adjuvant Trastuzumab,  zolendronate, carboplatin/gemcitabine   INTERVAL HISTORY: [name] returns today for follow-up and treatment of her triple negative breast cancer. She is accompanied by her daughter [name].  [name] continues on chemotherapy, consisting of carboplatin/gemcitabine, which she receives day 1 and day 8 of each cycle.  Day 1 cycle 2 was due 10/19/2018 but at that time [name] felt poorly, with a variety of symptoms, and more importantly was rethinking whether she wanted chemotherapy or not.  She is here today to either begin cycle 2 or moved towards a palliative/comfort care approach. She has decided to keep on with chemotherapy, but states that she may discontinue if she truly feels bad.    She receives [place] on day 10 of each cycle.   In the meantime she continues on trastuzumab.  She receives this every 28 days and can receive a dose today.  [name]'s last echocardiogram on 08/17/2018, showed an ejection fraction in the 60% - 65% range.   Since her last visit here on 10/19/2018, she has not undergone any studies.  Results for [name], [name] (MRN [id number]) as of 10/25/2018 16:14  Ref. Range 09/19/2017 10:01  07/28/2018 10:47 08/26/2018 08:28 09/24/2018 07:47  CA 27.29 Latest Ref Range: 0.0 - 38.6 U/mL 35.8 60.7 (H) 71.6 (H) 86.0 (H)    REVIEW OF SYSTEMS: [name] believes that she has a sinus infection; she has green snot, ear drainage, and headaches between the eyes. She stated that she felt a bit better overall and she went out to eat a few times over the weekend. She felt some nausea a week ago but she attributes it to her nerves, and took a compazine to treat with relief. The patient denies visual changes, vomiting, or dizziness. There has been no unusual cough, phlegm production, or pleurisy. This been no change in bowel or bladder habits. The patient denies unexplained fatigue or unexplained weight loss, bleeding, rash, or fever. A detailed review of systems was otherwise noncontributory.    BREAST CANCER HISTORY: From the original intake note:  "[name]" had a fall while traveling living to [place] in their RV and injure her left upper arm and  her left breast . Approximately 3 weeks later she noted a mass in the left breast upper inner quadrant. It was somewhat tender. She brought this to medical attention and on 09/27/2016 underwent bilateral diagnostic mammography with tomography [facility] The breast density was category B. The right breast was benign. In the left breast upper inner quadrant there was a large lobulated hyperdense mass which by exam was firm and nonmobile. By ultrasonography in the 11:30 o'clock radius 4 cm from the nipple there was a mass extending to the subcutaneous tissues and measuring 4.9 cm. There was a small superficial fluid collection overlying this consistent with a hematoma. The left  axilla was benign.  Biopsy of the left breast mass in question 09/30/2016 showed (SAA 17-02/04/[id number]) and invasive ductal carcinoma grade 3, estrogen receptor 80% positive with moderate staining intensity, progesterone receptor negative, MIB-1 of 80%, and no  HER-2 amplification, the signals ratio being 1.49 and the number per cell 2.60.  The patient's subsequent history is as detailed below   PAST MEDICAL HISTORY: Past Medical History:  Diagnosis Date  . Anxiety   . CAP (community acquired pneumonia)   . Depression   . Malignant neoplasm of upper-inner quadrant of left female breast ([place]) 10/01/2016  . Personal history of chemotherapy   . Tobacco use     PAST SURGICAL HISTORY: Past Surgical History:  Procedure Laterality Date  . ABDOMINAL HYSTERECTOMY  2011   Laparoscopic  . AUGMENTATION MAMMAPLASTY Bilateral 03/2017  . BREAST LUMPECTOMY Left 04/05/2017  . BREAST LUMPECTOMY WITH SENTINEL LYMPH NODE BIOPSY Left 04/07/2017  . BREAST REDUCTION SURGERY Left 04/07/2017  . CHOLECYSTECTOMY    . MASTECTOMY Bilateral 08/25/2017   at [facility]  . open reduction left ankle    . SKIN GRAFT Left 06/19/2017   bil skin grafts to breasts    FAMILY HISTORY Family History  Problem Relation Age of Onset  . Heart disease Mother   . Heart disease Father   . Cancer Sister        ovarian ca   The patient's father died at the age of 41 from a myocardial infarction. The patient's mother died at the age of 26 with congestive heart failure. The patient had one brother, 2 sisters. One of her sisters was diagnosed with ovarian cancer at age 82 and died at age 22 from that disease   GYNECOLOGIC HISTORY:  No LMP recorded. Patient has had a hysterectomy. Menarche age 31, first live birth age 85. The patient is GX P1. She stopped having periods approximately 2008. She did not take hormone replacement. She used oral contraceptives for approximately 30 years with no complications.   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 Her husband [name] is retired from [employer]. Their daughter [name], used to live in [place], but has moved back to [place]. [name]'s husband  works in Engineer, technical sales for healthcare systems. The patient welcomed her 1st grandchild, a boy named [name] in May 2019.       ADVANCED DIRECTIVES: not in place   HEALTH MAINTENANCE: Social History   Tobacco Use  . Smoking status: Former Smoker    Packs/day: 0.50    Years: 20.00    Pack years: 10.00    Last attempt to quit: 10/21/2016    Years since quitting: 2.0  . Smokeless tobacco: Never Used  Substance Use Topics  . Alcohol use: No  . Drug use: No     Colonoscopy:  PAP:  Bone density:   No Known Allergies  Current Outpatient Medications  Medication Sig Dispense Refill  . acetaminophen (TYLENOL) 325 MG tablet Take 650 mg by mouth every 6 (six) hours as needed for moderate pain.     Marland Kitchen ALPRAZolam (XANAX XR) 1 MG 24 hr tablet TAKE 1 TABLET BY MOUTH EVERY DAY 30 tablet 1  . busPIRone (BUSPAR) 30 MG tablet Take 1 tablet (30 mg total) by mouth 3 (three) times daily. 90 tablet 4  . diphenhydrAMINE (BENADRYL) 25 MG tablet Take 50 mg by mouth as needed for sleep.     Marland Kitchen gabapentin (NEURONTIN)  300 MG capsule Take 1 capsule (300 mg total) by mouth at bedtime. 90 capsule 4  . ibuprofen (ADVIL,MOTRIN) 200 MG tablet Take 600 mg by mouth every 6 (six) hours as needed for moderate pain.     Marland Kitchen ondansetron (ZOFRAN) 8 MG tablet Take 1 tablet (8 mg total) by mouth 2 (two) times daily as needed for refractory nausea / vomiting. Start on day 3 after chemotherapy. 30 tablet 1  . PARoxetine (PAXIL) 40 MG tablet Take 40 mg by mouth every morning.    . prochlorperazine (COMPAZINE) 10 MG tablet Take 1 tablet (10 mg total) by mouth every 6 (six) hours as needed (Nausea or vomiting). 30 tablet 1  . ranitidine (ZANTAC) 150 MG tablet Take 150 mg by mouth daily as needed for heartburn.     Marland Kitchen tiZANidine (ZANAFLEX) 4 MG tablet TAKE 1 TABLET BY MOUTH AT BEDTIME 30 tablet 1  . valACYclovir (VALTREX) 1000 MG tablet Take 1 tablet (1,000 mg total) by mouth daily. 90 tablet 2   No current facility-administered medications for  this visit.    Facility-Administered Medications Ordered in Other Visits  Medication Dose Route Frequency Provider Last Rate Last Dose  . sodium chloride flush (NS) 0.9 % injection 10 mL  10 mL Intracatheter Once Melvine Julin, [name], MD         OBJECTIVE: Middle-aged white woman who appears well  Vitals:   10/26/18 0833  BP: (!) 158/75  Pulse: 97  Resp: 17  Temp: 97.9 F (36.6 C)  SpO2: 100%     Body mass index is 30.33 kg/m.   Filed Weights   10/26/18 0833  Weight: 205 lb 6.4 oz (93.2 kg)     ECOG FS:1 - Symptomatic but completely ambulatory   Sclerae unicteric, EOMs intact Oropharynx clear and moist No cervical or supraclavicular adenopathy Lungs no rales or rhonchi Heart regular rate and rhythm Abd soft, nontender, positive bowel sounds MSK no focal spinal tenderness, no upper extremity lymphedema; the left foot, which was swollen and bluish last visit looks much less swollen and much less bluish this time Neuro: nonfocal, well oriented, appropriate affect Breasts: Status post bilateral mastectomies.  The 2 left chest wall lesions noted at the last visit are unchanged   Photo 10/19/2018     Chest Wall 09/24/2018 (the right side is up on this photo)     Left Chest wall 04/22/2018    Chest wall 06/29/2018 Chest Wall 06/29/2018    LAB RESULTS:  CMP     Component Value Date/Time   NA 143 10/26/2018 0820   NA 140 10/24/2017 1307   K 5.0 10/26/2018 0820   K 5.4 (H) 10/24/2017 1307   CL 109 10/26/2018 0820   CO2 24 10/26/2018 0820   CO2 29 10/24/2017 1307   GLUCOSE 126 (H) 10/26/2018 0820   GLUCOSE 103 10/24/2017 1307   BUN 14 10/26/2018 0820   BUN 18.2 10/24/2017 1307   CREATININE 0.86 10/26/2018 0820   CREATININE 0.8 10/24/2017 1307   CALCIUM 9.7 10/26/2018 0820   CALCIUM 10.3 10/24/2017 1307   PROT 7.4 10/26/2018 0820   PROT 7.5 10/24/2017 1307   ALBUMIN 3.8 10/26/2018 0820   ALBUMIN 4.0 10/24/2017 1307   AST 16 10/26/2018 0820   AST 9  10/24/2017 1307   ALT 12 10/26/2018 0820   ALT 8 10/24/2017 1307   ALKPHOS 134 (H) 10/26/2018 0820   ALKPHOS 115 10/24/2017 1307   BILITOT 0.3 10/26/2018 0820   BILITOT 0.28 10/24/2017  [place] 10/26/2018 0820   GFRAA >60 10/26/2018 0820    INo results found for: SPEP, UPEP  Lab Results  Component Value Date   WBC 10.7 (H) 10/26/2018   NEUTROABS 8.1 (H) 10/26/2018   HGB 9.9 (L) 10/26/2018   HCT 31.6 (L) 10/26/2018   MCV 105.3 (H) 10/26/2018   PLT 564 (H) 10/26/2018      Chemistry      Component Value Date/Time   NA 143 10/26/2018 0820   NA 140 10/24/2017 1307   K 5.0 10/26/2018 0820   K 5.4 (H) 10/24/2017 1307   CL 109 10/26/2018 0820   CO2 24 10/26/2018 0820   CO2 29 10/24/2017 1307   BUN 14 10/26/2018 0820   BUN 18.2 10/24/2017 1307   CREATININE 0.86 10/26/2018 0820   CREATININE 0.8 10/24/2017 1307      Component Value Date/Time   CALCIUM 9.7 10/26/2018 0820   CALCIUM 10.3 10/24/2017 1307   ALKPHOS 134 (H) 10/26/2018 0820   ALKPHOS 115 10/24/2017 1307   AST 16 10/26/2018 0820   AST 9 10/24/2017 1307   ALT 12 10/26/2018 0820   ALT 8 10/24/2017 1307   BILITOT 0.3 10/26/2018 0820   BILITOT 0.28 10/24/2017 1307       No results found for: LABCA2  No components found for: LABCA125  No results for input(s): INR in the last 168 hours.  Urinalysis    Component Value Date/Time   COLORURINE STRAW (A) 10/30/2017 2050   APPEARANCEUR CLEAR 10/30/2017 2050   LABSPEC 1.006 10/30/2017 2050   [place] 6.0 10/30/2017 2050   GLUCOSEU NEGATIVE 10/30/2017 2050   [place] NEGATIVE 10/30/2017 2050   [place] NEGATIVE 10/30/2017 2050   [place] 10/30/2017 2050   PROTEINUR NEGATIVE 10/30/2017 2050   NITRITE NEGATIVE 10/30/2017 2050   LEUKOCYTESUR NEGATIVE 10/30/2017 2050     STUDIES: No results found.  ELIGIBLE FOR AVAILABLE RESEARCH PROTOCOL: no   ASSESSMENT: 63 y.o. [employer], [place] woman status post left breast upper inner quadrant  biopsy 09/30/2016 for a clinical T2 N0, stage II a invasive ductal carcinoma, grade 3, estrogen receptor positive, progesterone receptor negative, HER-2 not amplified, with an MIB-1 of 80%.  (1) genetics testing pending  (2) neoadjuvant chemotherapy given between 10/30/2016 and 03/12/2017 consisting of cyclophosphamide and doxorubicin in dose dense fashion 4  followed by paclitaxel 1, then Abraxane (x8?)  further treatments discontinued because of neuropathy.  (3) status post left lumpectomy with oncoplasty and right reduction mammoplasty showing a residual T2 N1 invasive ductal carcinoma, with lymphovascular invasion, but negative margins; the tumor is now HER-2 positive.  (a) postop PET scan showed no evidence of metastatic disease.  (4) adjuvant trastuzumab/pertuzumab started 06/20/2017 (received [facility]), started here on 07/11/2017--continued through April 2019 when patient left for [place]  (a) Echo on 06/18/2017 at [facility]: LVEF 55-60%  (b) echocardiogram 09/26/2017 shows an ejection fraction in the 60-65% range.  (c) echocardiogram 12/26/2017 shows an ejection fraction in the 55-60% range  (d) see below for subsequent echocardiogram results   (5) adjuvant radiation was to follow, but see (7) below  (6) anti-estrogens: Letrozole daily started 03/26/2017  (a) DEXA on 04/03/2017 demonstrated a T score of -1.3 in the right femoral neck  (b) Zometa given at [facility] on 06/20/2017  RECURRENCE: (7) Patient developed left inner breast nodule and underwent mammogram and ultrasound on 08/06/17 showing three areas of concern at 10 o'clock, 1130 o'clock, and at 8 o'clock. Biopsy on 08/07/2017 demonstrated  at 11:30 fat necrosis, however at 10:00 it was positive for IDC, grade 3, ER+(50%), PR-(0%), Ki-67 90%, HER-2 negative (ratio 1.48).  (a) staging chest CT 08/15/2017 shows 0.5 cm left lower lobe lung nodule of uncertain significance  (8) status post left modified radical mastectomy and right simple  mastectomy 08/25/2017 showing  (a) right breast, no evidence of malignancy  (b) left breast, ypT2 ypN1 invasive ductal carcinoma, grade 3, with negative margins; repeat prognostic panel: Triple negative  (9) started adjuvant chemotherapy with cyclophosphamide/methotrexate/fluorouracil [CMF] 11/10/2016, eight cycles planned  (a) continuing trastuzumab/Pertuzumab   (b) CMF chemotherapy discontinued after 4 cycles with disease progression  (10) status post left chest wall biopsy for further local recurrence 01/05/2018, the tumor being now weakly estrogen receptor positive, progesterone receptor negative, and again HER-2 negative  (11) PET scan 03/25/2018 negative except for a 1.2 cm left internal mammary lymph node, SUV 6.9  (12) adjuvant radiation completed 03/19/2018  (a) did not receive capecitabine sensitization  (13)  Letrozole resumed 03/27/2018--discontinued 06/29/2018  (14) foundation one 01/09/2018 shows no actionable mutations, with stable microsatellite status and low mutational burden.  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 (a) PD-L1 testing on immune cells, 0% staining (negative)--obtained on 08/07/2017 cycle, which was weakly ER positive, progesterone and HER-2 negative (b) genetics testing through [facility] October 31, 2016 found no deleterious mutations in ATM, BRCA1, BRCA2, BRI P1, CDH 1, CH EK 2, EPCAM, MLH1, MSH2, MSH6, NBN, NF1, PALB 2, PMS2, PTEN, RAD 51C, RAD 51D, STK 1 1, or TP 53.  (15) trastuzumab and Pertuzumab resumed 03/31/2018 (a) echocardiogram on 04/20/2018 showed an ejection fraction in the 50-55% range.  (b) changed to every 4-week treatments beginning 07/01/2018 to coordinate with fulvestrant (c) echocardiogram 08/17/2018 shows a well-preserved ejection fraction. (d) pertuzumab discontinued as of December 2019  (16) fine-needle aspiration of a chest wall lesion on 06/04/2018 was read as "non-small cell carcinoma".  Punch biopsy obtained at the visit 06/08/2018 for  prognostic panel determination showed poorly differentiated carcinoma, estrogen receptor positive at 60%, progesterone receptor negative, HER-2 negative by immunohistochemistry at 1+  (17) fulvestrant started 07/01/2018, repeat every 28 days  (a) mid September started palbociclib 125 mg daily, 21/7,   (b) discontinued with evidence of mixed response  (18) carboplatin/gemcitabine days 1 and 8 started 09/29/2018, repeated every 21 days (a) baseline PET scan 09/23/2018 shows progression on prior treatment, measurable disease (b) CA-27-29 is now mildly informative   (c) cycle 2 of carboplatin/gemcitabine delayed 1 week  PLAN:   [name] is still considering the possibility of best supportive care and no further chemo, but she has discussed it with her family and at this point they are strongly encouraging her to continue treatment.  I think it is important for her to finish 3 cycles of the current chemo.  If it does not work then we have that information to guide [place] in the future.  She tells me her doctor in [place] mention the word capecitabine to her.  Of course she has already had fluorouracil as part of her CMF treatment in the past.  Nevertheless capecitabine is an option as is Doxil and other drugs that she has not yet had.  She understands my real concern with her is uncontrolled local spread on her left chest wall area.  She tells me that if she needs further surgery she would prefer Dr. Excell Seltzer.  I have taken Dr.Arradondo from the team list to avoid confusion, but she has only positive things to say about  Dr. Genoveva Ill and it is simply a matter of convenience.  I wrote for Zithromax for her and she will also take over-the-counter supportive meds for her sinus problems.  Note that she has had no fever  She will see me again next week or the day 8 treatment of her second cycle.  She knows to call for any other issues that may develop before that visit.    Brettany Sydney, [name], MD  10/26/18  9:04 AM Medical Oncology and Hematology [facility] [address] [place], [place] [address] Tel. [phone]    Fax. [phone]   I, Jacqualyn Posey am acting as a Education administrator for Chauncey Cruel, MD.   I, Lurline Del MD, have reviewed the above documentation for accuracy and completeness, and I agree with the above.

## 2018-10-26 ENCOUNTER — Inpatient Hospital Stay: Payer: BLUE CROSS/BLUE SHIELD | Attending: Oncology

## 2018-10-26 ENCOUNTER — Inpatient Hospital Stay: Payer: BLUE CROSS/BLUE SHIELD

## 2018-10-26 ENCOUNTER — Inpatient Hospital Stay (HOSPITAL_BASED_OUTPATIENT_CLINIC_OR_DEPARTMENT_OTHER): Payer: BLUE CROSS/BLUE SHIELD | Admitting: Oncology

## 2018-10-26 DIAGNOSIS — C50212 Malignant neoplasm of upper-inner quadrant of left female breast: Secondary | ICD-10-CM | POA: Insufficient documentation

## 2018-10-26 DIAGNOSIS — Z5112 Encounter for antineoplastic immunotherapy: Secondary | ICD-10-CM | POA: Diagnosis not present

## 2018-10-26 DIAGNOSIS — C7951 Secondary malignant neoplasm of bone: Secondary | ICD-10-CM | POA: Insufficient documentation

## 2018-10-26 DIAGNOSIS — T451X5A Adverse effect of antineoplastic and immunosuppressive drugs, initial encounter: Principal | ICD-10-CM

## 2018-10-26 DIAGNOSIS — D649 Anemia, unspecified: Secondary | ICD-10-CM | POA: Insufficient documentation

## 2018-10-26 DIAGNOSIS — C50912 Malignant neoplasm of unspecified site of left female breast: Secondary | ICD-10-CM

## 2018-10-26 DIAGNOSIS — Z170 Estrogen receptor positive status [ER+]: Secondary | ICD-10-CM

## 2018-10-26 DIAGNOSIS — Z5111 Encounter for antineoplastic chemotherapy: Secondary | ICD-10-CM | POA: Insufficient documentation

## 2018-10-26 DIAGNOSIS — Z5189 Encounter for other specified aftercare: Secondary | ICD-10-CM | POA: Insufficient documentation

## 2018-10-26 DIAGNOSIS — D6481 Anemia due to antineoplastic chemotherapy: Secondary | ICD-10-CM | POA: Insufficient documentation

## 2018-10-26 LAB — COMPREHENSIVE METABOLIC PANEL
ALK PHOS: 134 U/L — AB (ref 38–126)
ALT: 12 U/L (ref 0–44)
AST: 16 U/L (ref 15–41)
Albumin: 3.8 g/dL (ref 3.5–5.0)
Anion gap: 10 (ref 5–15)
BUN: 14 mg/dL (ref 8–23)
CO2: 24 mmol/L (ref 22–32)
Calcium: 9.7 mg/dL (ref 8.9–10.3)
Chloride: 109 mmol/L (ref 98–111)
Creatinine, Ser: 0.86 mg/dL (ref 0.44–1.00)
GFR calc Af Amer: >60 mL/min (ref >60)
GFR calc non Af Amer: >60 mL/min (ref >60)
Glucose, Bld: 126 mg/dL — ABNORMAL HIGH (ref 70–99)
POTASSIUM: 5 mmol/L (ref 3.5–5.1)
Sodium: 143 mmol/L (ref 135–145)
Total Bilirubin: 0.3 mg/dL (ref 0.3–1.2)
Total Protein: 7.4 g/dL (ref 6.5–8.1)

## 2018-10-26 LAB — CBC WITH DIFFERENTIAL/PLATELET
Abs Immature Granulocytes: 0.25 10*3/uL — ABNORMAL HIGH (ref 0.00–0.07)
Basophils Absolute: 0.1 10*3/uL (ref 0.0–0.1)
Basophils Relative: 1 %
Eosinophils Absolute: 0.2 10*3/uL (ref 0.0–0.5)
Eosinophils Relative: 2 %
HEMATOCRIT: 31.6 % — AB (ref 36.0–46.0)
Hemoglobin: 9.9 g/dL — ABNORMAL LOW (ref 12.0–15.0)
Immature Granulocytes: 2 %
Lymphocytes Relative: 11 %
Lymphs Abs: 1.2 10*3/uL (ref 0.7–4.0)
MCH: 33 pg (ref 26.0–34.0)
MCHC: 31.3 g/dL (ref 30.0–36.0)
MCV: 105.3 fL — ABNORMAL HIGH (ref 80.0–100.0)
Monocytes Absolute: 0.9 10*3/uL (ref 0.1–1.0)
Monocytes Relative: 8 %
NEUTROS PCT: 76 %
Neutro Abs: 8.1 10*3/uL — ABNORMAL HIGH (ref 1.7–7.7)
Platelets: 564 10*3/uL — ABNORMAL HIGH (ref 150–400)
RBC: 3 MIL/uL — ABNORMAL LOW (ref 3.87–5.11)
RDW: 17.5 % — ABNORMAL HIGH (ref 11.5–15.5)
WBC: 10.7 10*3/uL — ABNORMAL HIGH (ref 4.0–10.5)
nRBC: 0.9 % — ABNORMAL HIGH (ref 0.0–0.2)

## 2018-10-26 MED ORDER — DIPHENHYDRAMINE HCL 25 MG PO CAPS
ORAL_CAPSULE | ORAL | Status: AC
  Filled 2018-10-26: qty 1

## 2018-10-26 MED ORDER — SODIUM CHLORIDE 0.9 % IV SOLN
2000.0000 mg | Freq: Once | INTRAVENOUS | Status: AC
  Administered 2018-10-26: 2000 mg via INTRAVENOUS
  Filled 2018-10-26: qty 52.6

## 2018-10-26 MED ORDER — DEXAMETHASONE SODIUM PHOSPHATE 10 MG/ML IJ SOLN
10.0000 mg | Freq: Once | INTRAMUSCULAR | Status: AC
  Administered 2018-10-26: 10 mg via INTRAVENOUS

## 2018-10-26 MED ORDER — ACETAMINOPHEN 325 MG PO TABS
ORAL_TABLET | ORAL | Status: AC
  Filled 2018-10-26: qty 2

## 2018-10-26 MED ORDER — PALONOSETRON HCL INJECTION 0.25 MG/5ML
INTRAVENOUS | Status: AC
  Filled 2018-10-26: qty 5

## 2018-10-26 MED ORDER — DEXAMETHASONE SODIUM PHOSPHATE 10 MG/ML IJ SOLN
INTRAMUSCULAR | Status: AC
  Filled 2018-10-26: qty 1

## 2018-10-26 MED ORDER — DIPHENHYDRAMINE HCL 25 MG PO CAPS
25.0000 mg | ORAL_CAPSULE | Freq: Once | ORAL | Status: AC
  Administered 2018-10-26: 25 mg via ORAL

## 2018-10-26 MED ORDER — PALONOSETRON HCL INJECTION 0.25 MG/5ML
0.2500 mg | Freq: Once | INTRAVENOUS | Status: AC
  Administered 2018-10-26: 0.25 mg via INTRAVENOUS

## 2018-10-26 MED ORDER — HEPARIN SOD (PORK) LOCK FLUSH 100 UNIT/ML IV SOLN
500.0000 [IU] | Freq: Once | INTRAVENOUS | Status: AC | PRN
  Administered 2018-10-26: 500 [IU]
  Filled 2018-10-26: qty 5

## 2018-10-26 MED ORDER — SODIUM CHLORIDE 0.9% FLUSH
10.0000 mL | INTRAVENOUS | Status: DC | PRN
  Administered 2018-10-26: 10 mL
  Filled 2018-10-26: qty 10

## 2018-10-26 MED ORDER — AZITHROMYCIN 250 MG PO TABS: ORAL_TABLET | ORAL | 0 refills | Status: DC

## 2018-10-26 MED ORDER — TRASTUZUMAB CHEMO 150 MG IV SOLR
6.0000 mg/kg | Freq: Once | INTRAVENOUS | Status: AC
  Administered 2018-10-26: 567 mg via INTRAVENOUS
  Filled 2018-10-26: qty 27

## 2018-10-26 MED ORDER — SODIUM CHLORIDE 0.9 % IV SOLN
Freq: Once | INTRAVENOUS | Status: AC
  Administered 2018-10-26: 09:00:00 via INTRAVENOUS
  Filled 2018-10-26: qty 250

## 2018-10-26 MED ORDER — SODIUM CHLORIDE 0.9 % IV SOLN
253.2000 mg | Freq: Once | INTRAVENOUS | Status: AC
  Administered 2018-10-26: 250 mg via INTRAVENOUS
  Filled 2018-10-26: qty 25

## 2018-10-26 MED ORDER — ACETAMINOPHEN 325 MG PO TABS
650.0000 mg | ORAL_TABLET | Freq: Once | ORAL | Status: AC
  Administered 2018-10-26: 650 mg via ORAL

## 2018-10-26 NOTE — Patient Instructions (Signed)
[place] Discharge Instructions for Patients Receiving Chemotherapy  Today you received the following chemotherapy agents Gemzar, Carboplatin, and Herceptin.   To help prevent nausea and vomiting after your treatment, we encourage you to take your nausea medication as directed.   If you develop nausea and vomiting that is not controlled by your nausea medication, call the clinic.   BELOW ARE SYMPTOMS THAT SHOULD BE REPORTED IMMEDIATELY:  *FEVER GREATER THAN 100.5 F  *CHILLS WITH OR WITHOUT FEVER  NAUSEA AND VOMITING THAT IS NOT CONTROLLED WITH YOUR NAUSEA MEDICATION  *UNUSUAL SHORTNESS OF BREATH  *UNUSUAL BRUISING OR BLEEDING  TENDERNESS IN MOUTH AND THROAT WITH OR WITHOUT PRESENCE OF ULCERS  *URINARY PROBLEMS  *BOWEL PROBLEMS  UNUSUAL RASH Items with * indicate a potential emergency and should be followed up as soon as possible.  Feel free to call the clinic should you have any questions or concerns. The clinic phone number is [phone].  Please show the [place] at check-in to the Emergency Department and triage nurse.

## 2018-10-27 LAB — CANCER ANTIGEN 27.29: CA 27.29: 130.9 U/mL — ABNORMAL HIGH (ref 0.0–38.6)

## 2018-10-28 ENCOUNTER — Inpatient Hospital Stay: Payer: BLUE CROSS/BLUE SHIELD

## 2018-10-29 ENCOUNTER — Telehealth: Payer: Self-pay | Admitting: Oncology

## 2018-10-29 NOTE — Telephone Encounter (Signed)
Called regarding 1/13

## 2018-11-01 NOTE — Progress Notes (Signed)
[facility]  [phone] [phone]     ID: REBIE PEALE DOB: 1959-11-02  MR#: [id number]  TJQ#:[id number]  Patient Care Team: Chesley Noon, MD as PCP - General (Family Medicine) Excell Seltzer, MD as Consulting Physician (General Surgery) , [name], MD as Consulting Physician (Oncology) Gery Pray, MD as Consulting Physician (Radiation Oncology) Collene Gobble, MD as Consulting Physician (Pulmonary Disease) Verdell Carmine, MD as Referring Physician (Internal Medicine) Larey Dresser, MD as Consulting Physician (Cardiology) OTHER MD: Dr Lyda Jester [phone])   CHIEF COMPLAINT: estrogen receptor positive, HER-2 positive breast cancer; triple negative breast cancers   CURRENT TREATMENT: Adjuvant Trastuzumab,  zolendronate, carboplatin/gemcitabine   INTERVAL HISTORY: [name] returns today for follow-up and treatment of of her triple negative breast cancer. She is accompanied by her daughter, Benjie Karvonen.  The patient continues on chemotherapy, consisting of carboplatin/gemcitabine, which she receives day 1 and day 8 of each cycle. Today is day 8 cycle 2. She has had no bony aches. She tolerates this well and without any noticeable side effects.    She receives zoledronate every 12 weeks, with her most recent dose 09/24/2018  She also receives trastuzumab with her next cycle due 11/23/2018  Since her last visit here, she has not undergone any additional studies.   Results for ERRIN, WHITELAW (MRN [id number]) as of 11/01/2018 16:58  Ref. Range 09/19/2017 10:01 07/28/2018 10:47 08/26/2018 08:28 09/24/2018 07:47 10/26/2018 08:20  CA 27.29 Latest Ref Range: 0.0 - 38.6 U/mL 35.8 60.7 (H) 71.6 (H) 86.0 (H) 130.9 (H)     REVIEW OF SYSTEMS: [name] still has a lingering sinus infection. She still had a headache, mostly in the mornings; she has a lot of mucus and feels a bit better once she clears it. She takes Mucinex to  treat. She was taking ranitidine for acid reflux, but it was recalled and would like to request a new medication. She is trying to maintain a regular diet. She has had no diarrhea. The patient denies unusual headaches, visual changes, nausea, vomiting, or dizziness. There has been no unusual cough, phlegm production, or pleurisy. This been no change in bowel or bladder habits. The patient denies unexplained fatigue or unexplained weight loss, bleeding, rash, or fever. A detailed review of systems was otherwise noncontributory.    BREAST CANCER HISTORY: From the original intake note:  "[name]" had a fall while traveling living to [place] in their RV and injure her left upper arm and  her left breast . Approximately 3 weeks later she noted a mass in the left breast upper inner quadrant. It was somewhat tender. She brought this to medical attention and on 09/27/2016 underwent bilateral diagnostic mammography with tomography [facility] The breast density was category B. The right breast was benign. In the left breast upper inner quadrant there was a large lobulated hyperdense mass which by exam was firm and nonmobile. By ultrasonography in the 11:30 o'clock radius 4 cm from the nipple there was a mass extending to the subcutaneous tissues and measuring 4.9 cm. There was a small superficial fluid collection overlying this consistent with a hematoma. The left axilla was benign.  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  The patient's  subsequent history is as detailed below   PAST MEDICAL HISTORY: Past Medical History:  Diagnosis Date  . Anxiety   . CAP (community acquired pneumonia)   .  Depression   . Malignant neoplasm of upper-inner quadrant of left female breast ([place]) 10/01/2016  . Personal history of chemotherapy   . Tobacco use     PAST SURGICAL HISTORY: Past Surgical History:  Procedure Laterality Date  . ABDOMINAL HYSTERECTOMY  2011   Laparoscopic  . AUGMENTATION MAMMAPLASTY Bilateral 03/2017  . BREAST LUMPECTOMY Left 04/05/2017  . BREAST LUMPECTOMY WITH SENTINEL LYMPH NODE BIOPSY Left 04/07/2017  . BREAST REDUCTION SURGERY Left 04/07/2017  . CHOLECYSTECTOMY    . MASTECTOMY Bilateral 08/25/2017   at [facility]  . open reduction left ankle    . SKIN GRAFT Left 06/19/2017   bil skin grafts to breasts    FAMILY HISTORY Family History  Problem Relation Age of Onset  . Heart disease Mother   . Heart disease Father   . Cancer Sister        ovarian ca   The patient's father died at the age of 51 from a myocardial infarction. The patient's mother died at the age of 67 with congestive heart failure. The patient had one brother, 2 sisters. One of her sisters was diagnosed with ovarian cancer at age 21 and died at age 1 from that disease   GYNECOLOGIC HISTORY:  No LMP recorded. Patient has had a hysterectomy. Menarche age 63, first live birth age 48. The patient is GX P1. She stopped having periods approximately 2008. She did not take hormone replacement. She used oral contraceptives for approximately 30 years with no complications.   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 Her husband [name] is retired from [employer]. Their daughter Janiyla Long, used to live in [place], but has moved back to [place]. [name]'s husband works in Engineer, technical sales for healthcare systems. The patient welcomed her 1st grandchild, a boy named Atilano in May 2019.       ADVANCED DIRECTIVES: not in place   HEALTH MAINTENANCE: Social History   Tobacco Use  . Smoking status: Former Smoker    Packs/day: 0.50     Years: 20.00    Pack years: 10.00    Last attempt to quit: 10/21/2016    Years since quitting: 2.0  . Smokeless tobacco: Never Used  Substance Use Topics  . Alcohol use: No  . Drug use: No     Colonoscopy:  PAP:  Bone density:   No Known Allergies  Current Outpatient Medications  Medication Sig Dispense Refill  . acetaminophen (TYLENOL) 325 MG tablet Take 650 mg by mouth every 6 (six) hours as needed for moderate pain.     Marland Kitchen ALPRAZolam (XANAX XR) 1 MG 24 hr tablet TAKE 1 TABLET BY MOUTH EVERY DAY 30 tablet 1  . azithromycin (ZITHROMAX) 250 MG tablet Take 2 tablets day one, then one tablet daily 6 each 0  . busPIRone (BUSPAR) 30 MG tablet Take 1 tablet (30 mg total) by mouth 3 (three) times daily. 90 tablet 4  . diphenhydrAMINE (BENADRYL) 25 MG tablet Take 50 mg by mouth as needed for sleep.     Marland Kitchen ibuprofen (ADVIL,MOTRIN) 200 MG tablet Take 600 mg by mouth every 6 (six) hours as needed for moderate pain.     Marland Kitchen ondansetron (ZOFRAN) 8 MG tablet Take 1 tablet (8 mg total) by mouth  2 (two) times daily as needed for refractory nausea / vomiting. Start on day 3 after chemotherapy. 30 tablet 1  . PARoxetine (PAXIL) 40 MG tablet Take 40 mg by mouth every morning.    . prochlorperazine (COMPAZINE) 10 MG tablet Take 1 tablet (10 mg total) by mouth every 6 (six) hours as needed (Nausea or vomiting). 30 tablet 1  . ranitidine (ZANTAC) 150 MG tablet Take 150 mg by mouth daily as needed for heartburn.     Marland Kitchen tiZANidine (ZANAFLEX) 4 MG tablet TAKE 1 TABLET BY MOUTH AT BEDTIME 30 tablet 1  . valACYclovir (VALTREX) 1000 MG tablet Take 1 tablet (1,000 mg total) by mouth daily. 90 tablet 2   No current facility-administered medications for this visit.    Facility-Administered Medications Ordered in Other Visits  Medication Dose Route Frequency Provider Last Rate Last Dose  . sodium chloride flush (NS) 0.9 % injection 10 mL  10 mL Intracatheter Once , [name], MD         OBJECTIVE:  Middle-aged white woman in no acute distress  Vitals:   11/02/18 1241  BP: (!) 153/84  Pulse: 100  Resp: 18  Temp: 98.7 F (37.1 C)  SpO2: 100%     Body mass index is 30.1 kg/m.   Filed Weights   11/02/18 1241  Weight: 203 lb 12.8 oz (92.4 kg)     ECOG FS:1 - Symptomatic but completely ambulatory   Sclerae unicteric, pupils round and equal No cervical or supraclavicular adenopathy Lungs no rales or rhonchi Heart regular rate and rhythm Abd soft, nontender, positive bowel sounds MSK no focal spinal tenderness, no upper extremity lymphedema Neuro: nonfocal, well oriented, appropriate affect Breasts: Status post bilateral mastectomies.  There are 2 left chest wall lesions that we have been following appear essentially unchanged or minimally changed.  The inferior one in particular is slightly more erythematous.   Photo 10/19/2018     Chest Wall 09/24/2018 (the right side is up on this photo)     Left Chest wall 04/22/2018    Chest wall 06/29/2018 Chest Wall 06/29/2018    LAB RESULTS:  CMP     Component Value Date/Time   NA 143 10/26/2018 0820   NA 140 10/24/2017 1307   K 5.0 10/26/2018 0820   K 5.4 (H) 10/24/2017 1307   CL 109 10/26/2018 0820   CO2 24 10/26/2018 0820   CO2 29 10/24/2017 1307   GLUCOSE 126 (H) 10/26/2018 0820   GLUCOSE 103 10/24/2017 1307   BUN 14 10/26/2018 0820   BUN 18.2 10/24/2017 1307   CREATININE 0.86 10/26/2018 0820   CREATININE 0.8 10/24/2017 1307   CALCIUM 9.7 10/26/2018 0820   CALCIUM 10.3 10/24/2017 1307   PROT 7.4 10/26/2018 0820   PROT 7.5 10/24/2017 1307   ALBUMIN 3.8 10/26/2018 0820   ALBUMIN 4.0 10/24/2017 1307   AST 16 10/26/2018 0820   AST 9 10/24/2017 1307   ALT 12 10/26/2018 0820   ALT 8 10/24/2017 1307   ALKPHOS 134 (H) 10/26/2018 0820   ALKPHOS 115 10/24/2017 1307   BILITOT 0.3 10/26/2018 0820   BILITOT 0.28 10/24/2017 1307   GFRNONAA >60 10/26/2018 0820   GFRAA >60 10/26/2018 0820    INo results  found for: SPEP, UPEP  Lab Results  Component Value Date   WBC 10.7 (H) 10/26/2018   NEUTROABS 8.1 (H) 10/26/2018   HGB 9.9 (L) 10/26/2018   HCT 31.6 (L) 10/26/2018   MCV 105.3 (H) 10/26/2018  PLT 564 (H) 10/26/2018      Chemistry      Component Value Date/Time   NA 143 10/26/2018 0820   NA 140 10/24/2017 1307   K 5.0 10/26/2018 0820   K 5.4 (H) 10/24/2017 1307   CL 109 10/26/2018 0820   CO2 24 10/26/2018 0820   CO2 29 10/24/2017 1307   BUN 14 10/26/2018 0820   BUN 18.2 10/24/2017 1307   CREATININE 0.86 10/26/2018 0820   CREATININE 0.8 10/24/2017 1307      Component Value Date/Time   CALCIUM 9.7 10/26/2018 0820   CALCIUM 10.3 10/24/2017 1307   ALKPHOS 134 (H) 10/26/2018 0820   ALKPHOS 115 10/24/2017 1307   AST 16 10/26/2018 0820   AST 9 10/24/2017 1307   ALT 12 10/26/2018 0820   ALT 8 10/24/2017 1307   BILITOT 0.3 10/26/2018 0820   BILITOT 0.28 10/24/2017 1307       No results found for: LABCA2  No components found for: LABCA125  No results for input(s): INR in the last 168 hours.  Urinalysis    Component Value Date/Time   COLORURINE STRAW (A) 10/30/2017 2050   APPEARANCEUR CLEAR 10/30/2017 2050   LABSPEC 1.006 10/30/2017 2050   [place] 6.0 10/30/2017 2050   GLUCOSEU NEGATIVE 10/30/2017 2050   [place] NEGATIVE 10/30/2017 2050   [place] NEGATIVE 10/30/2017 2050   [place] 10/30/2017 2050   PROTEINUR NEGATIVE 10/30/2017 2050   NITRITE NEGATIVE 10/30/2017 2050   LEUKOCYTESUR NEGATIVE 10/30/2017 2050     STUDIES: No results found.  ELIGIBLE FOR AVAILABLE RESEARCH PROTOCOL: no   ASSESSMENT: 63 y.o. [employer], [place] woman status post left breast upper inner quadrant biopsy 09/30/2016 for a clinical T2 N0, stage II a invasive ductal carcinoma, grade 3, estrogen receptor positive, progesterone receptor negative, HER-2 not amplified, with an MIB-1 of 80%.  (1) genetics testing pending  (2) neoadjuvant chemotherapy given between  10/30/2016 and 03/12/2017 consisting of cyclophosphamide and doxorubicin in dose dense fashion 4  followed by paclitaxel 1, then Abraxane (x8?)  further treatments discontinued because of neuropathy.  (3) status post left lumpectomy with oncoplasty and right reduction mammoplasty showing a residual T2 N1 invasive ductal carcinoma, with lymphovascular invasion, but negative margins; the tumor is now HER-2 positive.  (a) postop PET scan showed no evidence of metastatic disease.  (4) adjuvant trastuzumab/pertuzumab started 06/20/2017 (received at [facility]), started here on 07/11/2017--continued through April 2019 when patient left for [place]  (a) Echo on 06/18/2017 at [facility]: LVEF 55-60%  (b) echocardiogram 09/26/2017 shows an ejection fraction in the 60-65% range.  (c) echocardiogram 12/26/2017 shows an ejection fraction in the 55-60% range  (d) see below for subsequent echocardiogram results   (5) adjuvant radiation was to follow, but see (7) below  (6) anti-estrogens: Letrozole daily started 03/26/2017  (a) DEXA on 04/03/2017 demonstrated a T score of -1.3 in the right femoral neck  (b) Zometa given at [facility] on 06/20/2017  RECURRENCE: (7) Patient developed left inner breast nodule and underwent mammogram and ultrasound on 08/06/17 showing three areas of concern at 10 o'clock, 1130 o'clock, and at 8 o'clock. Biopsy on 08/07/2017 demonstrated at 11:30 fat necrosis, however at 10:00 it was positive for IDC, grade 3, ER+(50%), PR-(0%), Ki-67 90%, HER-2 negative (ratio 1.48).  (a) staging chest CT 08/15/2017 shows 0.5 cm left lower lobe lung nodule of uncertain significance  (8) status post left modified radical mastectomy and right simple mastectomy 08/25/2017 showing  (a) right breast, no evidence of malignancy  (  b) left breast, ypT2 ypN1 invasive ductal carcinoma, grade 3, with negative margins; repeat prognostic panel: Triple negative  (9) started adjuvant chemotherapy with  cyclophosphamide/methotrexate/fluorouracil [CMF] 11/10/2016, eight cycles planned  (a) continuing trastuzumab/Pertuzumab   (b) CMF chemotherapy discontinued after 4 cycles with disease progression  (10) status post left chest wall biopsy for further local recurrence 01/05/2018, the tumor being now weakly estrogen receptor positive, progesterone receptor negative, and again HER-2 negative  (11) PET scan 03/25/2018 negative except for a 1.2 cm left internal mammary lymph node, SUV 6.9  (12) adjuvant radiation completed 03/19/2018  (a) did not receive capecitabine sensitization  (13)  Letrozole resumed 03/27/2018--discontinued 06/29/2018  (14) foundation one 01/09/2018 shows no actionable mutations, with stable microsatellite status and low mutational burden.  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 (a) PD-L1 testing on immune cells, 0% staining (negative)--obtained on 08/07/2017 cycle, which was weakly ER positive, progesterone and HER-2 negative (b) genetics testing through [facility] October 31, 2016 found no deleterious mutations in ATM, BRCA1, BRCA2, BRI P1, CDH 1, CH EK 2, EPCAM, MLH1, MSH2, MSH6, NBN, NF1, PALB 2, PMS2, PTEN, RAD 51C, RAD 51D, STK 1 1, or TP 53.  (15) trastuzumab and Pertuzumab resumed 03/31/2018 (a) echocardiogram on 04/20/2018 showed an ejection fraction in the 50-55% range.  (b) changed to every 4-week treatments beginning 07/01/2018 to coordinate with fulvestrant (c) echocardiogram 08/17/2018 shows a well-preserved ejection fraction. (d) pertuzumab discontinued as of December 2019  (16) fine-needle aspiration of a chest wall lesion on 06/04/2018 was read as "non-small cell carcinoma".  Punch biopsy obtained at the visit 06/08/2018 for prognostic panel determination showed poorly differentiated carcinoma, estrogen receptor positive at 60%, progesterone receptor negative, HER-2 negative by immunohistochemistry at 1+  (17) fulvestrant started 07/01/2018, repeat every 28 days  (a) mid  September started palbociclib 125 mg daily, 21/7,   (b) discontinued with evidence of mixed response  (18) carboplatin/gemcitabine days 1 and 8 started 09/29/2018, repeated every 21 days (a) baseline PET scan 09/23/2018 shows progression on prior treatment, measurable disease (b) CA-27-29 is now mildly informative   (c) cycle 2 of carboplatin/gemcitabine delayed 1 week   PLAN:   [name] is tolerating treatment generally well and will receive her cycle 2-day 8 chemotherapy today.  Unfortunately the tumor marker is going in the wrong direction.  This is not an absolute indicator of treatment failure but it is discouraging.  We are going to march through cycle 3 but after that she will have a restaging PET scan.  I put the order in today for that  The lesions on her chest wall that we have been following are essentially stable although one is slightly more erythematous.  We will keep observing this.  She will return in 2 days for her immune booster shot  As far as her sinuses is concerned she will continue to use Mucinex and she will add Claritin in the morning.  I reluctant to give her a second cycle of chemotherapy in the absence of any fever or worsening symptoms.  In fact things that do appear to be clearing up although slowly  He is worried that she has not lost her hair and wonders if if she should have lost it by now.  We reviewed her echocardiogram and the next one will be in April, 6 months from the last one.  She knows to call for any other issue that may develop before her next visit here.    , [name], MD  11/02/18 12:55 PM  Medical Oncology and Hematology [facility] [address] [place], [place] [address] Tel. [phone]    Fax. [phone]  I, Jacqualyn Posey am acting as a Education administrator for Chauncey Cruel, MD.   I, Lurline Del MD, have reviewed the above documentation for accuracy and completeness, and I agree with the above.

## 2018-11-02 ENCOUNTER — Inpatient Hospital Stay: Payer: BLUE CROSS/BLUE SHIELD

## 2018-11-02 ENCOUNTER — Inpatient Hospital Stay (HOSPITAL_BASED_OUTPATIENT_CLINIC_OR_DEPARTMENT_OTHER): Payer: BLUE CROSS/BLUE SHIELD | Admitting: Oncology

## 2018-11-02 ENCOUNTER — Telehealth: Payer: Self-pay | Admitting: Oncology

## 2018-11-02 VITALS — BP 153/84 | HR 100 | Temp 98.7°F | Resp 18 | Ht 69.0 in | Wt 203.8 lb

## 2018-11-02 DIAGNOSIS — Z170 Estrogen receptor positive status [ER+]: Secondary | ICD-10-CM

## 2018-11-02 DIAGNOSIS — C50212 Malignant neoplasm of upper-inner quadrant of left female breast: Secondary | ICD-10-CM

## 2018-11-02 DIAGNOSIS — Z5112 Encounter for antineoplastic immunotherapy: Secondary | ICD-10-CM | POA: Diagnosis not present

## 2018-11-02 DIAGNOSIS — C50912 Malignant neoplasm of unspecified site of left female breast: Secondary | ICD-10-CM

## 2018-11-02 DIAGNOSIS — C7951 Secondary malignant neoplasm of bone: Secondary | ICD-10-CM | POA: Diagnosis not present

## 2018-11-02 DIAGNOSIS — Z95828 Presence of other vascular implants and grafts: Secondary | ICD-10-CM

## 2018-11-02 LAB — COMPREHENSIVE METABOLIC PANEL
ALT: 13 U/L (ref 0–44)
AST: 15 U/L (ref 15–41)
Albumin: 3.8 g/dL (ref 3.5–5.0)
Alkaline Phosphatase: 122 U/L (ref 38–126)
Anion gap: 8 (ref 5–15)
BUN: 12 mg/dL (ref 8–23)
CO2: 26 mmol/L (ref 22–32)
Calcium: 9 mg/dL (ref 8.9–10.3)
Chloride: 105 mmol/L (ref 98–111)
Creatinine, Ser: 0.73 mg/dL (ref 0.44–1.00)
GFR calc Af Amer: >60 mL/min (ref >60)
GFR calc non Af Amer: >60 mL/min (ref >60)
Glucose, Bld: 95 mg/dL (ref 70–99)
Potassium: 3.6 mmol/L (ref 3.5–5.1)
SODIUM: 139 mmol/L (ref 135–145)
Total Bilirubin: 0.3 mg/dL (ref 0.3–1.2)
Total Protein: 7.1 g/dL (ref 6.5–8.1)

## 2018-11-02 LAB — CBC WITH DIFFERENTIAL/PLATELET
Abs Immature Granulocytes: 0.02 10*3/uL (ref 0.00–0.07)
Basophils Absolute: 0.1 10*3/uL (ref 0.0–0.1)
Basophils Relative: 2 %
Eosinophils Absolute: 0.1 10*3/uL (ref 0.0–0.5)
Eosinophils Relative: 1 %
HEMATOCRIT: 27.1 % — AB (ref 36.0–46.0)
Hemoglobin: 8.9 g/dL — ABNORMAL LOW (ref 12.0–15.0)
Immature Granulocytes: 1 %
LYMPHS ABS: 0.8 10*3/uL (ref 0.7–4.0)
Lymphocytes Relative: 20 %
MCH: 33 pg (ref 26.0–34.0)
MCHC: 32.8 g/dL (ref 30.0–36.0)
MCV: 100.4 fL — ABNORMAL HIGH (ref 80.0–100.0)
Monocytes Absolute: 0.3 10*3/uL (ref 0.1–1.0)
Monocytes Relative: 8 %
Neutro Abs: 2.7 10*3/uL (ref 1.7–7.7)
Neutrophils Relative %: 68 %
Platelets: 375 10*3/uL (ref 150–400)
RBC: 2.7 MIL/uL — ABNORMAL LOW (ref 3.87–5.11)
RDW: 15.7 % — ABNORMAL HIGH (ref 11.5–15.5)
WBC: 3.9 10*3/uL — ABNORMAL LOW (ref 4.0–10.5)
nRBC: 0.5 % — ABNORMAL HIGH (ref 0.0–0.2)

## 2018-11-02 MED ORDER — OMEPRAZOLE 40 MG PO CPDR: 40.0000 mg | DELAYED_RELEASE_CAPSULE | Freq: Every day | ORAL | 2 refills | Status: DC

## 2018-11-02 MED ORDER — SODIUM CHLORIDE 0.9 % IV SOLN
Freq: Once | INTRAVENOUS | Status: AC
  Administered 2018-11-02: 15:00:00 via INTRAVENOUS
  Filled 2018-11-02: qty 250

## 2018-11-02 MED ORDER — PALONOSETRON HCL INJECTION 0.25 MG/5ML
0.2500 mg | Freq: Once | INTRAVENOUS | Status: AC
  Administered 2018-11-02: 0.25 mg via INTRAVENOUS

## 2018-11-02 MED ORDER — SODIUM CHLORIDE 0.9% FLUSH
10.0000 mL | INTRAVENOUS | Status: DC | PRN
  Administered 2018-11-02: 10 mL
  Filled 2018-11-02: qty 10

## 2018-11-02 MED ORDER — PALONOSETRON HCL INJECTION 0.25 MG/5ML
INTRAVENOUS | Status: AC
  Filled 2018-11-02: qty 5

## 2018-11-02 MED ORDER — SODIUM CHLORIDE 0.9% FLUSH
10.0000 mL | Freq: Once | INTRAVENOUS | Status: AC
  Administered 2018-11-02: 10 mL
  Filled 2018-11-02: qty 10

## 2018-11-02 MED ORDER — SODIUM CHLORIDE 0.9 % IV SOLN
2000.0000 mg | Freq: Once | INTRAVENOUS | Status: AC
  Administered 2018-11-02: 2000 mg via INTRAVENOUS
  Filled 2018-11-02: qty 52.6

## 2018-11-02 MED ORDER — HEPARIN SOD (PORK) LOCK FLUSH 100 UNIT/ML IV SOLN
500.0000 [IU] | Freq: Once | INTRAVENOUS | Status: AC | PRN
  Administered 2018-11-02: 500 [IU]
  Filled 2018-11-02: qty 5

## 2018-11-02 MED ORDER — SODIUM CHLORIDE 0.9 % IV SOLN
268.4000 mg | Freq: Once | INTRAVENOUS | Status: AC
  Administered 2018-11-02: 270 mg via INTRAVENOUS
  Filled 2018-11-02: qty 27

## 2018-11-02 MED ORDER — DEXAMETHASONE SODIUM PHOSPHATE 10 MG/ML IJ SOLN
INTRAMUSCULAR | Status: AC
  Filled 2018-11-02: qty 1

## 2018-11-02 MED ORDER — DEXAMETHASONE SODIUM PHOSPHATE 10 MG/ML IJ SOLN
10.0000 mg | Freq: Once | INTRAMUSCULAR | Status: AC
  Administered 2018-11-02: 10 mg via INTRAVENOUS

## 2018-11-02 NOTE — Telephone Encounter (Signed)
Gave avs and calendar ° °

## 2018-11-02 NOTE — Addendum Note (Signed)
Addended by: Chauncey Cruel on: 11/02/2018 03:27 PM   Modules accepted: Orders

## 2018-11-02 NOTE — Patient Instructions (Addendum)
[facility] Discharge Instructions for Patients Receiving Chemotherapy  Today you received the following chemotherapy agents Gemzar and Carboplatin   To help prevent nausea and vomiting after your treatment, we encourage you to take your nausea medication as directed.    If you develop nausea and vomiting that is not controlled by your nausea medication, call the clinic.   BELOW ARE SYMPTOMS THAT SHOULD BE REPORTED IMMEDIATELY:  *FEVER GREATER THAN 100.5 F  *CHILLS WITH OR WITHOUT FEVER  NAUSEA AND VOMITING THAT IS NOT CONTROLLED WITH YOUR NAUSEA MEDICATION  *UNUSUAL SHORTNESS OF BREATH  *UNUSUAL BRUISING OR BLEEDING  TENDERNESS IN MOUTH AND THROAT WITH OR WITHOUT PRESENCE OF ULCERS  *URINARY PROBLEMS  *BOWEL PROBLEMS  UNUSUAL RASH Items with * indicate a potential emergency and should be followed up as soon as possible.  Feel free to call the clinic should you have any questions or concerns. The clinic phone number is [phone].  Please show the CHEMO ALERT CARD at check-in to the Emergency Department and triage nurse.   

## 2018-11-04 ENCOUNTER — Inpatient Hospital Stay: Payer: BLUE CROSS/BLUE SHIELD

## 2018-11-04 ENCOUNTER — Other Ambulatory Visit: Payer: Self-pay | Admitting: Oncology

## 2018-11-04 VITALS — BP 130/60 | HR 87 | Temp 98.0°F | Resp 18

## 2018-11-04 DIAGNOSIS — C50212 Malignant neoplasm of upper-inner quadrant of left female breast: Secondary | ICD-10-CM

## 2018-11-04 DIAGNOSIS — Z170 Estrogen receptor positive status [ER+]: Secondary | ICD-10-CM

## 2018-11-04 DIAGNOSIS — Z5112 Encounter for antineoplastic immunotherapy: Secondary | ICD-10-CM | POA: Diagnosis not present

## 2018-11-04 DIAGNOSIS — C50912 Malignant neoplasm of unspecified site of left female breast: Secondary | ICD-10-CM

## 2018-11-04 MED ORDER — PEGFILGRASTIM-CBQV 6 MG/0.6ML ~~LOC~~ SOSY
PREFILLED_SYRINGE | SUBCUTANEOUS | Status: AC
  Filled 2018-11-04: qty 0.6

## 2018-11-04 MED ORDER — PEGFILGRASTIM-CBQV 6 MG/0.6ML ~~LOC~~ SOSY
6.0000 mg | PREFILLED_SYRINGE | Freq: Once | SUBCUTANEOUS | Status: AC
  Administered 2018-11-04: 6 mg via SUBCUTANEOUS

## 2018-11-12 ENCOUNTER — Other Ambulatory Visit: Payer: Self-pay | Admitting: Nurse Practitioner

## 2018-11-13 NOTE — Progress Notes (Signed)
[facility]  [phone] [phone]) [phone]     ID: [name] DOB: 1962/02/21  MR#: [id number]  ONG#:[id number]  Patient Care Team: Chesley Noon, MD as PCP - General (Family Medicine) Excell Seltzer, MD as Consulting Physician (General Surgery) Cordarryl Monrreal, [name], MD as Consulting Physician (Oncology) Gery Pray, MD as Consulting Physician (Radiation Oncology) Collene Gobble, MD as Consulting Physician (Pulmonary Disease) Verdell Carmine, MD as Referring Physician (Internal Medicine) Larey Dresser, MD as Consulting Physician (Cardiology) OTHER MD: Dr Lyda Jester [phone])   CHIEF COMPLAINT: estrogen receptor positive, HER-2 positive breast cancer; triple negative breast cancers   CURRENT TREATMENT: Adjuvant Trastuzumab,  zolendronate, carboplatin/gemcitabine   INTERVAL HISTORY: [name] returns today for follow-up and treatment of her triple negative breast cancer. She is accompanied by her husband.  The patient continues on chemotherapy, consisting of carboplatin/gemcitabine, which she receives day 1 and day 8 of each cycle. Today is day 1 cycle 3.   She receives zoledronate every 12 weeks, with her most recent dose 09/24/2018.   She also receives trastuzumab, with her next cycle due 11/23/2018.   Margretta's last echocardiogram on 08/17/2018, showed an ejection fraction in the 60% - 65% range.   Her last baseline study was completed on 09/23/2018 showing progression on prior treatment and measurable disease. She is scheduled for a repeat PET on 12/10/2018.   Results for [name] (MRN [id number]) as of 11/16/2018 12:22  Ref. Range 09/19/2017 10:01 07/28/2018 10:47 08/26/2018 08:28 09/24/2018 07:47 10/26/2018 08:20  CA 27.29 Latest Ref Range: 0.0 - 38.6 U/mL 35.8 60.7 (H) 71.6 (H) 86.0 (H) 130.9 (H)   Since her last visit here, she has not undergone any additional studies.   REVIEW OF SYSTEMS: Elanah has been  feeling fatigued; she is now taking a nap during the day. She does not have a formal exercise routine, but she does spend time playing with her young grandchildren. Her appetite is good. She states that she feels sad because she isn't able to do as much as she used to like volunteer and going to the nursing home. The patient denies unusual headaches, visual changes, nausea, vomiting, or dizziness. There has been no unusual cough, phlegm production, or pleurisy. This been no change in bowel or bladder habits. The patient denies unexplained fatigue or unexplained weight loss, bleeding, rash, or fever. A detailed review of systems was otherwise noncontributory.    BREAST CANCER HISTORY: From the original intake note:  "[name]" had a fall while traveling living to [place] in their RV and injure her left upper arm and  her left breast . Approximately 3 weeks later she noted a mass in the left breast upper inner quadrant. It was somewhat tender. She brought this to medical attention and on 09/27/2016 underwent bilateral diagnostic mammography with tomography [facility] The breast density was category B. The right breast was benign. In the left breast upper inner quadrant there was a large lobulated hyperdense mass which by exam was firm and nonmobile. By ultrasonography in the 11:30 o'clock radius 4 cm from the nipple there was a mass extending to the subcutaneous tissues and measuring 4.9 cm. There was a small superficial fluid collection overlying this consistent with a hematoma. The left axilla was benign.  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  The patient's subsequent history is as detailed  below   PAST MEDICAL HISTORY: Past Medical History:  Diagnosis Date  . Anxiety   . CAP (community acquired pneumonia)   . Depression   . Malignant neoplasm of upper-inner quadrant of left female breast ([place]) 10/01/2016  . Personal history of chemotherapy   . Tobacco use     PAST SURGICAL HISTORY: Past Surgical History:  Procedure Laterality Date  . ABDOMINAL HYSTERECTOMY  2011   Laparoscopic  . AUGMENTATION MAMMAPLASTY Bilateral 03/2017  . BREAST LUMPECTOMY Left 04/05/2017  . BREAST LUMPECTOMY WITH SENTINEL LYMPH NODE BIOPSY Left 04/07/2017  . BREAST REDUCTION SURGERY Left 04/07/2017  . CHOLECYSTECTOMY    . MASTECTOMY Bilateral 08/25/2017   at [facility]  . open reduction left ankle    . SKIN GRAFT Left 06/19/2017   bil skin grafts to breasts    FAMILY HISTORY Family History  Problem Relation Age of Onset  . Heart disease Mother   . Heart disease Father   . Cancer Sister        ovarian ca   The patient's father died at the age of 43 from a myocardial infarction. The patient's mother died at the age of 81 with congestive heart failure. The patient had one brother, 2 sisters. One of her sisters was diagnosed with ovarian cancer at age 54 and died at age 30 from that disease   GYNECOLOGIC HISTORY:  No LMP recorded. Patient has had a hysterectomy. Menarche age 80, first live birth age 5. The patient is GX P1. She stopped having periods approximately 2008. She did not take hormone replacement. She used oral contraceptives for approximately 30 years with no complications.   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 Her husband [name] is retired from [employer]. Their daughter Madelline Eshbach, used to live in [place], but has moved back to [place]. [name]'s husband works in Engineer, technical sales for healthcare systems. The patient welcomed her 1st grandchild, a boy named Plunk in May 2019.       ADVANCED  DIRECTIVES: not in place   HEALTH MAINTENANCE: Social History   Tobacco Use  . Smoking status: Former Smoker    Packs/day: 0.50    Years: 20.00    Pack years: 10.00    Last attempt to quit: 10/21/2016    Years since quitting: 2.0  . Smokeless tobacco: Never Used  Substance Use Topics  . Alcohol use: No  . Drug use: No     Colonoscopy:  PAP:  Bone density:   No Known Allergies  Current Outpatient Medications  Medication Sig Dispense Refill  . acetaminophen (TYLENOL) 325 MG tablet Take 650 mg by mouth every 6 (six) hours as needed for moderate pain.     Marland Kitchen ALPRAZolam (XANAX XR) 1 MG 24 hr tablet TAKE 1 TABLET BY MOUTH EVERY DAY 30 tablet 1  . azithromycin (ZITHROMAX) 250 MG tablet Take 2 tablets day one, then one tablet daily 6 each 0  . busPIRone (BUSPAR) 30 MG tablet Take 1 tablet (30 mg total) by mouth 3 (three) times daily. 90 tablet 4  . diphenhydrAMINE (BENADRYL) 25 MG tablet Take 50 mg by mouth as needed for sleep.     Marland Kitchen ibuprofen (ADVIL,MOTRIN) 200 MG tablet Take 600 mg by mouth every 6 (six) hours as needed for moderate  pain.     . omeprazole (PRILOSEC) 40 MG capsule Take 1 capsule (40 mg total) by mouth daily. 60 capsule 2  . ondansetron (ZOFRAN) 8 MG tablet Take 1 tablet (8 mg total) by mouth 2 (two) times daily as needed for refractory nausea / vomiting. Start on day 3 after chemotherapy. 30 tablet 1  . PARoxetine (PAXIL) 40 MG tablet Take 40 mg by mouth every morning.    . prochlorperazine (COMPAZINE) 10 MG tablet Take 1 tablet (10 mg total) by mouth every 6 (six) hours as needed (Nausea or vomiting). 30 tablet 1  . ranitidine (ZANTAC) 150 MG tablet Take 150 mg by mouth daily as needed for heartburn.     Marland Kitchen tiZANidine (ZANAFLEX) 4 MG tablet TAKE 1 TABLET BY MOUTH EVERYDAY AT BEDTIME 30 tablet 1  . valACYclovir (VALTREX) 1000 MG tablet Take 1 tablet (1,000 mg total) by mouth daily. 90 tablet 2   No current facility-administered medications for this visit.     Facility-Administered Medications Ordered in Other Visits  Medication Dose Route Frequency Provider Last Rate Last Dose  . sodium chloride flush (NS) 0.9 % injection 10 mL  10 mL Intracatheter Once Marisel Tostenson, [name], MD         OBJECTIVE: Middle-aged white woman who appears stated age  Vitals:   11/16/18 1217  BP: (!) 150/78  Pulse: 100  Resp: 18  Temp: 98.4 F (36.9 C)  SpO2: 100%     Body mass index is 31.23 kg/m.   Filed Weights   11/16/18 1217  Weight: 211 lb 8 oz (95.9 kg)     ECOG FS:1 - Symptomatic but completely ambulatory   Sclerae unicteric, EOMs intact Oropharynx clear and moist No cervical or supraclavicular adenopathy Lungs no rales or rhonchi Heart regular rate and rhythm Abd soft, nontender, positive bowel sounds MSK no focal spinal tenderness, no upper extremity lymphedema Neuro: nonfocal, well oriented, appropriate affect Breasts: Status post bilateral mastectomies.  The chest wall lesions that we have been following are certainly not smaller, possibly a millimeter larger.   Photo 10/19/2018     Chest Wall 09/24/2018 (the right side is up on this photo)     Left Chest wall 04/22/2018    Chest wall 06/29/2018 Chest Wall 06/29/2018    LAB RESULTS:  CMP     Component Value Date/Time   NA 141 11/16/2018 1156   NA 140 10/24/2017 1307   K 4.1 11/16/2018 1156   K 5.4 (H) 10/24/2017 1307   CL 106 11/16/2018 1156   CO2 26 11/16/2018 1156   CO2 29 10/24/2017 1307   GLUCOSE 99 11/16/2018 1156   GLUCOSE 103 10/24/2017 1307   BUN 8 11/16/2018 1156   BUN 18.2 10/24/2017 1307   CREATININE 0.79 11/16/2018 1156   CREATININE 0.8 10/24/2017 1307   CALCIUM 8.7 (L) 11/16/2018 1156   CALCIUM 10.3 10/24/2017 1307   PROT 6.5 11/16/2018 1156   PROT 7.5 10/24/2017 1307   ALBUMIN 3.8 11/16/2018 1156   ALBUMIN 4.0 10/24/2017 1307   AST 19 11/16/2018 1156   AST 9 10/24/2017 1307   ALT 14 11/16/2018 1156   ALT 8 10/24/2017 1307   ALKPHOS 167  (H) 11/16/2018 1156   ALKPHOS 115 10/24/2017 1307   BILITOT 0.3 11/16/2018 1156   BILITOT 0.28 10/24/2017 1307   GFRNONAA >60 11/16/2018 1156   GFRAA >60 11/16/2018 1156    INo results found for: SPEP, UPEP  Lab Results  Component Value Date  WBC 39.5 (H) 11/16/2018   NEUTROABS PENDING 11/16/2018   HGB 8.0 (L) 11/16/2018   HCT 25.0 (L) 11/16/2018   MCV 105.9 (H) 11/16/2018   PLT 202 11/16/2018      Chemistry      Component Value Date/Time   NA 141 11/16/2018 1156   NA 140 10/24/2017 1307   K 4.1 11/16/2018 1156   K 5.4 (H) 10/24/2017 1307   CL 106 11/16/2018 1156   CO2 26 11/16/2018 1156   CO2 29 10/24/2017 1307   BUN 8 11/16/2018 1156   BUN 18.2 10/24/2017 1307   CREATININE 0.79 11/16/2018 1156   CREATININE 0.8 10/24/2017 1307      Component Value Date/Time   CALCIUM 8.7 (L) 11/16/2018 1156   CALCIUM 10.3 10/24/2017 1307   ALKPHOS 167 (H) 11/16/2018 1156   ALKPHOS 115 10/24/2017 1307   AST 19 11/16/2018 1156   AST 9 10/24/2017 1307   ALT 14 11/16/2018 1156   ALT 8 10/24/2017 1307   BILITOT 0.3 11/16/2018 1156   BILITOT 0.28 10/24/2017 1307       No results found for: LABCA2  No components found for: LABCA125  No results for input(s): INR in the last 168 hours.  Urinalysis    Component Value Date/Time   COLORURINE STRAW (A) 10/30/2017 2050   APPEARANCEUR CLEAR 10/30/2017 2050   LABSPEC 1.006 10/30/2017 2050   [place] 6.0 10/30/2017 2050   GLUCOSEU NEGATIVE 10/30/2017 2050   [place] NEGATIVE 10/30/2017 2050   [place] 10/30/2017 2050   [place] 10/30/2017 2050   PROTEINUR NEGATIVE 10/30/2017 2050   NITRITE NEGATIVE 10/30/2017 2050   LEUKOCYTESUR NEGATIVE 10/30/2017 2050     STUDIES: Restaging PET scan scheduled for 12/10/2018  ELIGIBLE FOR AVAILABLE RESEARCH PROTOCOL: no   ASSESSMENT: 64 y.o. [employer], [place] woman status post left breast upper inner quadrant biopsy 09/30/2016 for a clinical T2 N0, stage II a invasive  ductal carcinoma, grade 3, estrogen receptor positive, progesterone receptor negative, HER-2 not amplified, with an MIB-1 of 80%.  (1) genetics testing not actionable (see below).  (2) neoadjuvant chemotherapy given between 10/30/2016 and 03/12/2017 consisting of cyclophosphamide and doxorubicin in dose dense fashion 4  followed by paclitaxel 1, then Abraxane (x8?)  further treatments discontinued because of neuropathy.  (3) status post left lumpectomy with oncoplasty and right reduction mammoplasty showing a residual T2 N1 invasive ductal carcinoma, with lymphovascular invasion, but negative margins; the tumor is now HER-2 positive.  (a) postop PET scan showed no evidence of metastatic disease.  (4) adjuvant trastuzumab/pertuzumab started 06/20/2017 (received at [facility]), started here on 07/11/2017--continued through April 2019 when patient left for [place]  (a) Echo on 06/18/2017 at [facility]: LVEF 55-60%  (b) echocardiogram 09/26/2017 shows an ejection fraction in the 60-65% range.  (c) echocardiogram 12/26/2017 shows an ejection fraction in the 55-60% range  (d) see below for subsequent echocardiogram results   (5) adjuvant radiation was to follow, but see (7) below  (6) anti-estrogens: Letrozole daily started 03/26/2017  (a) DEXA on 04/03/2017 demonstrated a T score of -1.3 in the right femoral neck  (b) Zometa given at [facility] on 06/20/2017  RECURRENCE: (7) Patient developed left inner breast nodule and underwent mammogram and ultrasound on 08/06/17 showing three areas of concern at 10 o'clock, 1130 o'clock, and at 8 o'clock. Biopsy on 08/07/2017 demonstrated at 11:30 fat necrosis, however at 10:00 it was positive for IDC, grade 3, ER+(50%), PR-(0%), Ki-67 90%, HER-2 negative (ratio 1.48).  (a) staging chest  CT 08/15/2017 shows 0.5 cm left lower lobe lung nodule of uncertain significance  (8) status post left modified radical mastectomy and right simple mastectomy 08/25/2017 showing  (a) right  breast, no evidence of malignancy  (b) left breast, ypT2 ypN1 invasive ductal carcinoma, grade 3, with negative margins; repeat prognostic panel: Triple negative  (9) started adjuvant chemotherapy with cyclophosphamide/methotrexate/fluorouracil [CMF] 11/10/2016, eight cycles planned  (a) continuing trastuzumab/Pertuzumab   (b) CMF chemotherapy discontinued after 4 cycles with disease progression  (10) status post left chest wall biopsy for further local recurrence 01/05/2018, the tumor being now weakly estrogen receptor positive, progesterone receptor negative, and again HER-2 negative  (11) PET scan 03/25/2018 negative except for a 1.2 cm left internal mammary lymph node, SUV 6.9  (12) adjuvant radiation completed 03/19/2018  (a) did not receive capecitabine sensitization  (13)  Letrozole resumed 03/27/2018--discontinued 06/29/2018  (14) foundation one 01/09/2018 shows no actionable mutations, with stable microsatellite status and low mutational burden.  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 (a) PD-L1 testing on immune cells, 0% staining (negative)--obtained on 08/07/2017 cycle, which was weakly ER positive, progesterone and HER-2 negative (b) genetics testing through [facility] October 31, 2016 found no deleterious mutations in ATM, BRCA1, BRCA2, BRI P1, CDH 1, CH EK 2, EPCAM, MLH1, MSH2, MSH6, NBN, NF1, PALB 2, PMS2, PTEN, RAD 51C, RAD 51D, STK 1 1, or TP 53.  (15) trastuzumab and Pertuzumab resumed 03/31/2018 (a) echocardiogram on 04/20/2018 showed an ejection fraction in the 50-55% range.  (b) changed to every 4-week treatments beginning 07/01/2018 to coordinate with fulvestrant (c) echocardiogram 08/17/2018 shows a well-preserved ejection fraction. (d) pertuzumab discontinued as of December 2019  (16) fine-needle aspiration of a chest wall lesion on 06/04/2018 was read as "non-small cell carcinoma".  Punch biopsy obtained at the visit 06/08/2018 for prognostic panel determination showed poorly  differentiated carcinoma, estrogen receptor positive at 60%, progesterone receptor negative, HER-2 negative by immunohistochemistry at 1+  (17) fulvestrant started 07/01/2018, repeat every 28 days  (a) mid September started palbociclib 125 mg daily, 21/7,   (b) discontinued with evidence of mixed response  (18) carboplatin/gemcitabine days 1 and 8 started 09/29/2018, repeated every 21 days (a) baseline PET scan 09/23/2018 shows progression on prior treatment, measurable disease (b) CA-27-29 is now mildly informative   (c) cycle 2 of carboplatin/gemcitabine delayed 1 week   PLAN:   [name] does pretty well with the current chemo except that it is making her tired.  She is now quite anemic, with a hemoglobin of 8.  I think she will benefit from transfusion and we are trying to find a location on time when she can receive that this week.  Her CA-27-29 was up when last checked.  That was day 1 cycle 2 so she had only had 1 cycle of chemo when that was drawn.  The labs today will be more informative.  She is already scheduled for a repeat PET scan on 12/10/2018.  If she does not respond to the current chemo then I would probably remove the 2 skin lesions and again send him to the lab to see what we are dealing with.  We can obtain a [place] reading on those.  Very likely Doxil would be the next stop.  She knows to call for any other issue that may develop before the next visit here.    Illianna Paschal, [name], MD  11/16/18 12:54 PM Medical Oncology and Hematology [facility] [address] [place], [place] [address] Tel. [phone]  Fax. [phone]  I, Jacqualyn Posey am acting as a Education administrator for Chauncey Cruel, MD.   I, Lurline Del MD, have reviewed the above documentation for accuracy and completeness, and I agree with the above.

## 2018-11-16 ENCOUNTER — Other Ambulatory Visit: Payer: Self-pay | Admitting: Oncology

## 2018-11-16 ENCOUNTER — Inpatient Hospital Stay: Payer: BLUE CROSS/BLUE SHIELD

## 2018-11-16 ENCOUNTER — Inpatient Hospital Stay (HOSPITAL_BASED_OUTPATIENT_CLINIC_OR_DEPARTMENT_OTHER): Payer: BLUE CROSS/BLUE SHIELD | Admitting: Oncology

## 2018-11-16 VITALS — BP 150/78 | HR 100 | Temp 98.4°F | Resp 18 | Ht 69.0 in | Wt 211.5 lb

## 2018-11-16 DIAGNOSIS — C50212 Malignant neoplasm of upper-inner quadrant of left female breast: Secondary | ICD-10-CM

## 2018-11-16 DIAGNOSIS — Z5112 Encounter for antineoplastic immunotherapy: Secondary | ICD-10-CM | POA: Diagnosis not present

## 2018-11-16 DIAGNOSIS — Z170 Estrogen receptor positive status [ER+]: Secondary | ICD-10-CM

## 2018-11-16 DIAGNOSIS — Z95828 Presence of other vascular implants and grafts: Secondary | ICD-10-CM

## 2018-11-16 DIAGNOSIS — D649 Anemia, unspecified: Secondary | ICD-10-CM

## 2018-11-16 DIAGNOSIS — C50912 Malignant neoplasm of unspecified site of left female breast: Secondary | ICD-10-CM

## 2018-11-16 DIAGNOSIS — C7951 Secondary malignant neoplasm of bone: Secondary | ICD-10-CM | POA: Diagnosis not present

## 2018-11-16 LAB — COMPREHENSIVE METABOLIC PANEL
ALK PHOS: 167 U/L — AB (ref 38–126)
ALT: 14 U/L (ref 0–44)
AST: 19 U/L (ref 15–41)
Albumin: 3.8 g/dL (ref 3.5–5.0)
Anion gap: 9 (ref 5–15)
BUN: 8 mg/dL (ref 8–23)
CO2: 26 mmol/L (ref 22–32)
Calcium: 8.7 mg/dL — ABNORMAL LOW (ref 8.9–10.3)
Chloride: 106 mmol/L (ref 98–111)
Creatinine, Ser: 0.79 mg/dL (ref 0.44–1.00)
GFR calc Af Amer: >60 mL/min (ref >60)
Glucose, Bld: 99 mg/dL (ref 70–99)
Potassium: 4.1 mmol/L (ref 3.5–5.1)
Sodium: 141 mmol/L (ref 135–145)
Total Bilirubin: 0.3 mg/dL (ref 0.3–1.2)
Total Protein: 6.5 g/dL (ref 6.5–8.1)

## 2018-11-16 LAB — CBC WITH DIFFERENTIAL/PLATELET
Abs Immature Granulocytes: 7.63 10*3/uL — ABNORMAL HIGH (ref 0.00–0.07)
Basophils Absolute: 0.1 10*3/uL (ref 0.0–0.1)
Basophils Relative: 0 %
EOS PCT: 1 %
Eosinophils Absolute: 0.5 10*3/uL (ref 0.0–0.5)
HCT: 25 % — ABNORMAL LOW (ref 36.0–46.0)
Hemoglobin: 8 g/dL — ABNORMAL LOW (ref 12.0–15.0)
Immature Granulocytes: 19 %
Lymphocytes Relative: 5 %
Lymphs Abs: 2.1 10*3/uL (ref 0.7–4.0)
MCH: 33.9 pg (ref 26.0–34.0)
MCHC: 32 g/dL (ref 30.0–36.0)
MCV: 105.9 fL — AB (ref 80.0–100.0)
Monocytes Absolute: 2.5 10*3/uL — ABNORMAL HIGH (ref 0.1–1.0)
Monocytes Relative: 6 %
Neutro Abs: 26.8 10*3/uL — ABNORMAL HIGH (ref 1.7–7.7)
Neutrophils Relative %: 69 %
Platelets: 202 10*3/uL (ref 150–400)
RBC: 2.36 MIL/uL — ABNORMAL LOW (ref 3.87–5.11)
RDW: 18.5 % — ABNORMAL HIGH (ref 11.5–15.5)
WBC: 39.5 10*3/uL — ABNORMAL HIGH (ref 4.0–10.5)
nRBC: 1.2 % — ABNORMAL HIGH (ref 0.0–0.2)

## 2018-11-16 MED ORDER — DEXAMETHASONE SODIUM PHOSPHATE 10 MG/ML IJ SOLN
10.0000 mg | Freq: Once | INTRAMUSCULAR | Status: AC
  Administered 2018-11-16: 10 mg via INTRAVENOUS

## 2018-11-16 MED ORDER — SODIUM CHLORIDE 0.9 % IV SOLN
Freq: Once | INTRAVENOUS | Status: AC
  Administered 2018-11-16: 14:00:00 via INTRAVENOUS
  Filled 2018-11-16: qty 250

## 2018-11-16 MED ORDER — PALONOSETRON HCL INJECTION 0.25 MG/5ML
INTRAVENOUS | Status: AC
  Filled 2018-11-16: qty 5

## 2018-11-16 MED ORDER — SODIUM CHLORIDE 0.9% FLUSH
10.0000 mL | INTRAVENOUS | Status: DC | PRN
  Administered 2018-11-16: 10 mL
  Filled 2018-11-16: qty 10

## 2018-11-16 MED ORDER — PALONOSETRON HCL INJECTION 0.25 MG/5ML
0.2500 mg | Freq: Once | INTRAVENOUS | Status: AC
  Administered 2018-11-16: 0.25 mg via INTRAVENOUS

## 2018-11-16 MED ORDER — HEPARIN SOD (PORK) LOCK FLUSH 100 UNIT/ML IV SOLN
500.0000 [IU] | Freq: Once | INTRAVENOUS | Status: AC | PRN
  Administered 2018-11-16: 500 [IU]
  Filled 2018-11-16: qty 5

## 2018-11-16 MED ORDER — SODIUM CHLORIDE 0.9 % IV SOLN
Freq: Once | INTRAVENOUS | Status: AC
  Filled 2018-11-16: qty 250

## 2018-11-16 MED ORDER — SODIUM CHLORIDE 0.9 % IV SOLN
268.4000 mg | Freq: Once | INTRAVENOUS | Status: AC
  Administered 2018-11-16: 270 mg via INTRAVENOUS
  Filled 2018-11-16: qty 27

## 2018-11-16 MED ORDER — SODIUM CHLORIDE 0.9 % IV SOLN
2000.0000 mg | Freq: Once | INTRAVENOUS | Status: AC
  Administered 2018-11-16: 2000 mg via INTRAVENOUS
  Filled 2018-11-16: qty 52.6

## 2018-11-16 MED ORDER — DEXAMETHASONE SODIUM PHOSPHATE 10 MG/ML IJ SOLN
INTRAMUSCULAR | Status: AC
  Filled 2018-11-16: qty 1

## 2018-11-16 NOTE — Patient Instructions (Signed)
[facility] Discharge Instructions for Patients Receiving Chemotherapy  Today you received the following chemotherapy agents Gemzar and Carboplatin   To help prevent nausea and vomiting after your treatment, we encourage you to take your nausea medication as directed.    If you develop nausea and vomiting that is not controlled by your nausea medication, call the clinic.   BELOW ARE SYMPTOMS THAT SHOULD BE REPORTED IMMEDIATELY:  *FEVER GREATER THAN 100.5 F  *CHILLS WITH OR WITHOUT FEVER  NAUSEA AND VOMITING THAT IS NOT CONTROLLED WITH YOUR NAUSEA MEDICATION  *UNUSUAL SHORTNESS OF BREATH  *UNUSUAL BRUISING OR BLEEDING  TENDERNESS IN MOUTH AND THROAT WITH OR WITHOUT PRESENCE OF ULCERS  *URINARY PROBLEMS  *BOWEL PROBLEMS  UNUSUAL RASH Items with * indicate a potential emergency and should be followed up as soon as possible.  Feel free to call the clinic should you have any questions or concerns. The clinic phone number is [phone].  Please show the CHEMO ALERT CARD at check-in to the Emergency Department and triage nurse.   

## 2018-11-17 ENCOUNTER — Other Ambulatory Visit: Payer: Self-pay | Admitting: *Deleted

## 2018-11-17 DIAGNOSIS — D649 Anemia, unspecified: Secondary | ICD-10-CM

## 2018-11-17 LAB — CANCER ANTIGEN 27.29: CA 27.29: 138.9 U/mL — ABNORMAL HIGH (ref 0.0–38.6)

## 2018-11-18 ENCOUNTER — Ambulatory Visit: Payer: BLUE CROSS/BLUE SHIELD

## 2018-11-18 ENCOUNTER — Other Ambulatory Visit: Payer: Self-pay

## 2018-11-18 MED ORDER — MAGIC MOUTHWASH W/LIDOCAINE: 15.0000 mL | Freq: Three times a day (TID) | ORAL | 0 refills | Status: DC | PRN

## 2018-11-19 ENCOUNTER — Ambulatory Visit (HOSPITAL_COMMUNITY)
Admission: RE | Admit: 2018-11-19 | Discharge: 2018-11-19 | Disposition: A | Discharge status: Home / Self Care | Payer: BLUE CROSS/BLUE SHIELD | Source: Ambulatory Visit | Attending: Oncology | Admitting: Oncology

## 2018-11-19 DIAGNOSIS — D649 Anemia, unspecified: Secondary | ICD-10-CM | POA: Diagnosis not present

## 2018-11-19 LAB — ABO/RH: ABO/RH(D): B POS

## 2018-11-19 LAB — PREPARE RBC (CROSSMATCH)

## 2018-11-19 MED ORDER — DIPHENHYDRAMINE HCL 25 MG PO CAPS
25.0000 mg | ORAL_CAPSULE | Freq: Once | ORAL | Status: AC
  Administered 2018-11-19: 25 mg via ORAL
  Filled 2018-11-19: qty 1

## 2018-11-19 MED ORDER — SODIUM CHLORIDE 0.9% IV SOLUTION
250.0000 mL | Freq: Once | INTRAVENOUS | Status: AC
  Administered 2018-11-19: 250 mL via INTRAVENOUS

## 2018-11-19 MED ORDER — HEPARIN SOD (PORK) LOCK FLUSH 100 UNIT/ML IV SOLN
500.0000 [IU] | Freq: Every day | INTRAVENOUS | Status: AC | PRN
  Administered 2018-11-19: 500 [IU]
  Filled 2018-11-19: qty 5

## 2018-11-19 MED ORDER — HEPARIN SOD (PORK) LOCK FLUSH 100 UNIT/ML IV SOLN: 250.0000 [IU] | INTRAVENOUS | Status: DC | PRN

## 2018-11-19 MED ORDER — SODIUM CHLORIDE 0.9% FLUSH: 3.0000 mL | INTRAVENOUS | Status: DC | PRN

## 2018-11-19 MED ORDER — SODIUM CHLORIDE 0.9% FLUSH
10.0000 mL | INTRAVENOUS | Status: AC | PRN
  Administered 2018-11-19: 10 mL

## 2018-11-19 MED ORDER — ACETAMINOPHEN 325 MG PO TABS
650.0000 mg | ORAL_TABLET | Freq: Once | ORAL | Status: AC
  Administered 2018-11-19: 650 mg via ORAL
  Filled 2018-11-19: qty 2

## 2018-11-19 NOTE — Discharge Instructions (Signed)
Blood Transfusion, Adult, Care After This sheet gives you information about how to care for yourself after your procedure. Your doctor may also give you more specific instructions. If you have problems or questions, contact your doctor. Follow these instructions at home:   Take over-the-counter and prescription medicines only as told by your doctor.  Go back to your normal activities as told by your doctor.  Follow instructions from your doctor about how to take care of the area where an IV tube was put into your vein (insertion site). Make sure you: ? Wash your hands with soap and water before you change your bandage (dressing). If there is no soap and water, use hand sanitizer. ? Change your bandage as told by your doctor.  Check your IV insertion site every day for signs of infection. Check for: ? More redness, swelling, or pain. ? More fluid or blood. ? Warmth. ? Pus or a bad smell. Contact a doctor if:  You have more redness, swelling, or pain around the IV insertion site.  You have more fluid or blood coming from the IV insertion site.  Your IV insertion site feels warm to the touch.  You have pus or a bad smell coming from the IV insertion site.  Your pee (urine) turns pink, red, or brown.  You feel weak after doing your normal activities. Get help right away if:  You have signs of a serious allergic or body defense (immune) system reaction, including: ? Itchiness. ? Hives. ? Trouble breathing. ? Anxiety. ? Pain in your chest or lower back. ? Fever, flushing, and chills. ? Fast pulse. ? Rash. ? Watery poop (diarrhea). ? Throwing up (vomiting). ? Dark pee. ? Serious headache. ? Dizziness. ? Stiff neck. ? Yellow color in your face or the white parts of your eyes (jaundice). Summary  After a blood transfusion, return to your normal activities as told by your doctor.  Every day, check for signs of infection where the IV tube was put into your vein.  Some  signs of infection are warm skin, more redness and pain, more fluid or blood, and pus or a bad smell where the needle went in.  Contact your doctor if you feel weak or have any unusual symptoms. This information is not intended to replace advice given to you by your health care provider. Make sure you discuss any questions you have with your health care provider. Document Released: 10/28/2014 Document Revised: 05/31/2016 Document Reviewed: 05/31/2016 Elsevier Interactive Patient Education  2019 Elsevier Inc.  

## 2018-11-19 NOTE — Progress Notes (Signed)
PATIENT CARE CENTER NOTE  Diagnosis: Symptomatic anemia (D64.9)   Provider: Dr. Lurline Del    Procedure: 2 units PRBC's    Note: Patient received 2 units of blood through PAC. Pre-medications given (Tyenol and Benadryl) per order. Tolerated transfusion well with no adverse reaction. Vital signs stable. Discharge instructions given. Patient alert, oriented and ambulatory at discharge.

## 2018-11-20 ENCOUNTER — Other Ambulatory Visit: Payer: Self-pay | Admitting: Nurse Practitioner

## 2018-11-20 LAB — BPAM RBC
Blood Product Expiration Date: 202002242359
Blood Product Expiration Date: 202002242359
ISSUE DATE / TIME: 202001301006
ISSUE DATE / TIME: 202001301006
Unit Type and Rh: 7300
Unit Type and Rh: 7300

## 2018-11-20 LAB — TYPE AND SCREEN
ABO/RH(D): B POS
Antibody Screen: NEGATIVE
Unit division: 0
Unit division: 0

## 2018-11-22 NOTE — Progress Notes (Signed)
[name]  [phone]) [phone] Fax:([phone]) [phone]    ID: [name] DOB: 16-Jun-1956  MR#: [id number]  OIP#:[id number]  Patient Care Team: Chesley Noon, MD as PCP - General (Family Medicine) Excell Seltzer, MD as Consulting Physician (General Surgery) Tinna Kolker, [name], MD as Consulting Physician (Oncology) Gery Pray, MD as Consulting Physician (Radiation Oncology) Collene Gobble, MD as Consulting Physician (Pulmonary Disease) Verdell Carmine, MD as Referring Physician (Internal Medicine) Larey Dresser, MD as Consulting Physician (Cardiology) OTHER MD: Dr Lyda Jester [phone])   CHIEF COMPLAINT: estrogen receptor positive, HER-2 positive breast cancer; triple negative breast cancers  CURRENT TREATMENT: Adjuvant Trastuzumab,  zolendronate, carboplatin/gemcitabine   INTERVAL HISTORY: [name] returns today for follow-up and treatment of her triple negative breast cancer. She is accompanied by her husband and daughter.  She continues on chemotherapy, consisting of carboplatin/gemcitabine, which she receives day 1 and day 8 of each cycle. Today is day 8 cycle 3. She does not have nausea after treatment.   She receives zoledronate every 12 weeks, with her most recent dose 09/24/2018.   She also receives trastuzumab, with her next cycle due today (11/23/2018).   [name]'s last echocardiogram on 08/17/2018, showed an ejection fraction in the 60% - 65% range. She will not need a repeat echocardiogram until 01/2019.   Her last baseline study was completed on 09/23/2018 showing progression on prior treatment and measurable disease. She is scheduled for a repeat PET on 12/10/2018  Results for [name], [name] (MRN [id number]) as of 11/22/2018 13:09  Ref. Range 07/28/2018 10:47 08/26/2018 08:28 09/24/2018 07:47 10/26/2018 08:20 11/16/2018 11:56  CA 27.29 Latest Ref Range: 0.0 - 38.6 U/mL 60.7 (H) 71.6 (H) 86.0 (H) 130.9 (H) 138.9 (H)   Since her  last visit here, she has not undergone any additional studies.   REVIEW OF SYSTEMS: [name] got her transfusion, which made her feel a little better. She still feels fatigued, and she is sleeping well at night. She feels diaphoresis with activity. Her appetite and taste are good. She does not have a formal exercise routine. She gets an occasional sinus headache. She believes she has noticed some small changes in her left breast. The patient denies visual changes, nausea, vomiting, or dizziness. There has been no unusual cough, phlegm production, or pleurisy. This been no change in bowel or bladder habits. The patient denies unexplained fatigue or unexplained weight loss, bleeding, rash, or fever. A detailed review of systems was otherwise noncontributory.    BREAST CANCER HISTORY: From the original intake note:  "[name]" had a fall while traveling living to [place] in their RV and injure her left upper arm and  her left breast . Approximately 3 weeks later she noted a mass in the left breast upper inner quadrant. It was somewhat tender. She brought this to medical attention and on 09/27/2016 underwent bilateral diagnostic mammography with tomography [facility] The breast density was category B. The right breast was benign. In the left breast upper inner quadrant there was a large lobulated hyperdense mass which by exam was firm and nonmobile. By ultrasonography in the 11:30 o'clock radius 4 cm from the nipple there was a mass extending to the subcutaneous tissues and measuring 4.9 cm. There was a small superficial fluid collection overlying this consistent with a hematoma. The left axilla was benign.  Biopsy of the left breast mass in question 09/30/2016 showed (SAA [id number]) and invasive ductal carcinoma grade 3, estrogen receptor  80% positive with moderate staining intensity, progesterone receptor negative, MIB-1 of 80%, and no HER-2 amplification, the  signals ratio being 1.49 and the number per cell 2.60.  The patient's subsequent history is as detailed below   PAST MEDICAL HISTORY: Past Medical History:  Diagnosis Date  . Anxiety   . CAP (community acquired pneumonia)   . Depression   . Malignant neoplasm of upper-inner quadrant of left female breast ([place]) 10/01/2016  . Personal history of chemotherapy   . Tobacco use     PAST SURGICAL HISTORY: Past Surgical History:  Procedure Laterality Date  . ABDOMINAL HYSTERECTOMY  2011   Laparoscopic  . AUGMENTATION MAMMAPLASTY Bilateral 03/2017  . BREAST LUMPECTOMY Left 04/05/2017  . BREAST LUMPECTOMY WITH SENTINEL LYMPH NODE BIOPSY Left 04/07/2017  . BREAST REDUCTION SURGERY Left 04/07/2017  . CHOLECYSTECTOMY    . MASTECTOMY Bilateral 08/25/2017   at [facility]  . open reduction left ankle    . SKIN GRAFT Left 06/19/2017   bil skin grafts to breasts    FAMILY HISTORY Family History  Problem Relation Age of Onset  . Heart disease Mother   . Heart disease Father   . Cancer Sister        ovarian ca   The patient's father died at the age of 68 from a myocardial infarction. The patient's mother died at the age of 4 with congestive heart failure. The patient had one brother, 2 sisters. One of her sisters was diagnosed with ovarian cancer at age 65 and died at age 42 from that disease   GYNECOLOGIC HISTORY:  No LMP recorded. Patient has had a hysterectomy. Menarche age 29, first live birth age 33. The patient is GX P1. She stopped having periods approximately 2008. She did not take hormone replacement. She used oral contraceptives for approximately 30 years with no complications.   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 Her husband [name] is retired from [employer]. Their daughter [name], used to live in [place], but has moved back to [place]. [name]'s husband works in Engineer, technical sales for  healthcare systems. The patient welcomed her 1st grandchild, a boy named [name] in May 2019.       ADVANCED DIRECTIVES: not in place   HEALTH MAINTENANCE: Social History   Tobacco Use  . Smoking status: Former Smoker    Packs/day: 0.50    Years: 20.00    Pack years: 10.00    Last attempt to quit: 10/21/2016    Years since quitting: 2.0  . Smokeless tobacco: Never Used  Substance Use Topics  . Alcohol use: No  . Drug use: No     Colonoscopy:  PAP:  Bone density:   No Known Allergies  Current Outpatient Medications  Medication Sig Dispense Refill  . acetaminophen (TYLENOL) 325 MG tablet Take 650 mg by mouth every 6 (six) hours as needed for moderate pain.     Marland Kitchen ALPRAZolam (XANAX XR) 1 MG 24 hr tablet TAKE 1 TABLET BY MOUTH EVERY DAY 30 tablet 1  . azithromycin (ZITHROMAX) 250 MG tablet Take 2 tablets day one, then one tablet daily 6 each 0  . busPIRone (BUSPAR) 30 MG tablet Take 1 tablet (30 mg total) by mouth 3 (three) times daily. 90 tablet 4  . diphenhydrAMINE (BENADRYL) 25 MG tablet Take 50 mg by mouth as needed for sleep.     Marland Kitchen ibuprofen (ADVIL,MOTRIN) 200 MG tablet Take 600  mg by mouth every 6 (six) hours as needed for moderate pain.     . magic mouthwash w/lidocaine SOLN Take 15 mLs by mouth 3 (three) times daily as needed for mouth pain. 240 mL 0  . omeprazole (PRILOSEC) 40 MG capsule Take 1 capsule (40 mg total) by mouth daily. 60 capsule 2  . ondansetron (ZOFRAN) 8 MG tablet Take 1 tablet (8 mg total) by mouth 2 (two) times daily as needed for refractory nausea / vomiting. Start on day 3 after chemotherapy. 30 tablet 1  . PARoxetine (PAXIL) 40 MG tablet Take 40 mg by mouth every morning.    . prochlorperazine (COMPAZINE) 10 MG tablet Take 1 tablet (10 mg total) by mouth every 6 (six) hours as needed (Nausea or vomiting). 30 tablet 1  . ranitidine (ZANTAC) 150 MG tablet Take 150 mg by mouth daily as needed for heartburn.     Marland Kitchen tiZANidine (ZANAFLEX) 4 MG tablet TAKE 1  TABLET BY MOUTH EVERYDAY AT BEDTIME 30 tablet 1  . valACYclovir (VALTREX) 1000 MG tablet Take 1 tablet (1,000 mg total) by mouth daily. 90 tablet 2   No current facility-administered medications for this visit.    Facility-Administered Medications Ordered in Other Visits  Medication Dose Route Frequency Provider Last Rate Last Dose  . sodium chloride flush (NS) 0.9 % injection 10 mL  10 mL Intracatheter Once Lucio Litsey, [name], MD         OBJECTIVE: Middle-aged white woman in no acute distress  Vitals:   11/23/18 1328  BP: (!) 148/62  Pulse: 87  Resp: 18  Temp: 98.3 F (36.8 C)  SpO2: 100%     Body mass index is 30.23 kg/m.   Filed Weights   11/23/18 1328  Weight: 204 lb 11.2 oz (92.9 kg)     ECOG FS:2 - Symptomatic, <50% confined to bed   Sclerae unicteric, EOMs intact No cervical or supraclavicular adenopathy Lungs no rales or rhonchi Heart regular rate and rhythm Abd soft, nontender, positive bowel sounds MSK no focal spinal tenderness, no upper extremity lymphedema Neuro: nonfocal, well oriented, appropriate affect Breasts: Status post bilateral mastectomies.  The 2 skin nodules we have been following are not obviously different.  The area between the nodules is a little bit more prominent and slightly pinker, which could be consistent with disease progression locally.   Photo 10/19/2018     Chest Wall 09/24/2018 (the right side is up on this photo)     Left Chest wall 04/22/2018    Chest wall 06/29/2018 Chest Wall 06/29/2018    LAB RESULTS:  CMP     Component Value Date/Time   NA 141 11/16/2018 1156   NA 140 10/24/2017 1307   K 4.1 11/16/2018 1156   K 5.4 (H) 10/24/2017 1307   CL 106 11/16/2018 1156   CO2 26 11/16/2018 1156   CO2 29 10/24/2017 1307   GLUCOSE 99 11/16/2018 1156   GLUCOSE 103 10/24/2017 1307   BUN 8 11/16/2018 1156   BUN 18.2 10/24/2017 1307   CREATININE 0.79 11/16/2018 1156   CREATININE 0.8 10/24/2017 1307   CALCIUM 8.7  (L) 11/16/2018 1156   CALCIUM 10.3 10/24/2017 1307   PROT 6.5 11/16/2018 1156   PROT 7.5 10/24/2017 1307   ALBUMIN 3.8 11/16/2018 1156   ALBUMIN 4.0 10/24/2017 1307   AST 19 11/16/2018 1156   AST 9 10/24/2017 1307   ALT 14 11/16/2018 1156   ALT 8 10/24/2017 1307   ALKPHOS 167 (H) 11/16/2018  1156   ALKPHOS 115 10/24/2017 1307   BILITOT 0.3 11/16/2018 1156   BILITOT 0.28 10/24/2017 1307   GFRNONAA >60 11/16/2018 1156   GFRAA >60 11/16/2018 1156    INo results found for: SPEP, UPEP  Lab Results  Component Value Date   WBC 6.3 11/23/2018   NEUTROABS 4.7 11/23/2018   HGB 10.7 (L) 11/23/2018   HCT 33.1 (L) 11/23/2018   MCV 95.7 11/23/2018   PLT 268 11/23/2018      Chemistry      Component Value Date/Time   NA 141 11/16/2018 1156   NA 140 10/24/2017 1307   K 4.1 11/16/2018 1156   K 5.4 (H) 10/24/2017 1307   CL 106 11/16/2018 1156   CO2 26 11/16/2018 1156   CO2 29 10/24/2017 1307   BUN 8 11/16/2018 1156   BUN 18.2 10/24/2017 1307   CREATININE 0.79 11/16/2018 1156   CREATININE 0.8 10/24/2017 1307      Component Value Date/Time   CALCIUM 8.7 (L) 11/16/2018 1156   CALCIUM 10.3 10/24/2017 1307   ALKPHOS 167 (H) 11/16/2018 1156   ALKPHOS 115 10/24/2017 1307   AST 19 11/16/2018 1156   AST 9 10/24/2017 1307   ALT 14 11/16/2018 1156   ALT 8 10/24/2017 1307   BILITOT 0.3 11/16/2018 1156   BILITOT 0.28 10/24/2017 1307       No results found for: LABCA2  No components found for: LABCA125  No results for input(s): INR in the last 168 hours.  Urinalysis    Component Value Date/Time   COLORURINE STRAW (A) 10/30/2017 2050   APPEARANCEUR CLEAR 10/30/2017 2050   LABSPEC 1.006 10/30/2017 2050   [place] 6.0 10/30/2017 2050   GLUCOSEU NEGATIVE 10/30/2017 2050   [place] NEGATIVE 10/30/2017 2050   [place] 10/30/2017 2050   [place] 10/30/2017 2050   PROTEINUR NEGATIVE 10/30/2017 2050   NITRITE NEGATIVE 10/30/2017 2050   LEUKOCYTESUR NEGATIVE  10/30/2017 2050     STUDIES: Restaging PET scan scheduled for 12/10/2018   ELIGIBLE FOR AVAILABLE RESEARCH PROTOCOL: no   ASSESSMENT: 64 y.o. [employer], [place] woman status post left breast upper inner quadrant biopsy 09/30/2016 for a clinical T2 N0, stage II a invasive ductal carcinoma, grade 3, estrogen receptor positive, progesterone receptor negative, HER-2 not amplified, with an MIB-1 of 80%.  (1) genetics testing not actionable (see below).  (2) neoadjuvant chemotherapy given between 10/30/2016 and 03/12/2017 consisting of cyclophosphamide and doxorubicin in dose dense fashion 4  followed by paclitaxel 1, then Abraxane (x8?)  further treatments discontinued because of neuropathy.  (3) status post left lumpectomy with oncoplasty and right reduction mammoplasty showing a residual T2 N1 invasive ductal carcinoma, with lymphovascular invasion, but negative margins; the tumor is now HER-2 positive.  (a) postop PET scan showed no evidence of metastatic disease.  (4) adjuvant trastuzumab/pertuzumab started 06/20/2017 (received at [facility]), started here on 07/11/2017--continued through April 2019 when patient left for [place]  (a) Echo on 06/18/2017 at [facility]: LVEF 55-60%  (b) echocardiogram 09/26/2017 shows an ejection fraction in the 60-65% range.  (c) echocardiogram 12/26/2017 shows an ejection fraction in the 55-60% range  (d) see below for subsequent echocardiogram results   (5) adjuvant radiation was to follow, but see (7) below  (6) anti-estrogens: Letrozole daily started 03/26/2017  (a) DEXA on 04/03/2017 demonstrated a T score of -1.3 in the right femoral neck  (b) Zometa given at [facility] on 06/20/2017  RECURRENCE: (7) Patient developed left inner breast nodule and underwent mammogram  and ultrasound on 08/06/17 showing three areas of concern at 10 o'clock, 1130 o'clock, and at 8 o'clock. Biopsy on 08/07/2017 demonstrated at 11:30 fat necrosis, however at 10:00 it was positive for IDC,  grade 3, ER+(50%), PR-(0%), Ki-67 90%, HER-2 negative (ratio 1.48).  (a) staging chest CT 08/15/2017 shows 0.5 cm left lower lobe lung nodule of uncertain significance  (8) status post left modified radical mastectomy and right simple mastectomy 08/25/2017 showing  (a) right breast, no evidence of malignancy  (b) left breast, ypT2 ypN1 invasive ductal carcinoma, grade 3, with negative margins; repeat prognostic panel: Triple negative  (9) started adjuvant chemotherapy with cyclophosphamide/methotrexate/fluorouracil [CMF] 11/10/2016, eight cycles planned  (a) continuing trastuzumab/Pertuzumab   (b) CMF chemotherapy discontinued after 4 cycles with disease progression  (10) status post left chest wall biopsy for further local recurrence 01/05/2018, the tumor being now weakly estrogen receptor positive, progesterone receptor negative, and again HER-2 negative  (11) PET scan 03/25/2018 negative except for a 1.2 cm left internal mammary lymph node, SUV 6.9  (12) adjuvant radiation completed 03/19/2018  (a) did not receive capecitabine sensitization  (13)  Letrozole resumed 03/27/2018--discontinued 06/29/2018  (14) foundation one 01/09/2018 shows no actionable mutations, with stable microsatellite status and low mutational burden.  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 (a) PD-L1 testing on immune cells, 0% staining (negative)--obtained on 08/07/2017 cycle, which was weakly ER positive, progesterone and HER-2 negative (b) genetics testing through [facility] October 31, 2016 found no deleterious mutations in ATM, BRCA1, BRCA2, BRI P1, CDH 1, CH EK 2, EPCAM, MLH1, MSH2, MSH6, NBN, NF1, PALB 2, PMS2, PTEN, RAD 51C, RAD 51D, STK 1 1, or TP 53.  (15) trastuzumab and Pertuzumab resumed 03/31/2018 (a) echocardiogram on 04/20/2018 showed an ejection fraction in the 50-55% range.  (b) changed to every 4-week treatments beginning 07/01/2018 to coordinate with fulvestrant (c) echocardiogram 08/17/2018 shows a  well-preserved ejection fraction. (d) pertuzumab discontinued as of December 2019  (16) fine-needle aspiration of a chest wall lesion on 06/04/2018 was read as "non-small cell carcinoma".  Punch biopsy obtained at the visit 06/08/2018 for prognostic panel determination showed poorly differentiated carcinoma, estrogen receptor positive at 60%, progesterone receptor negative, HER-2 negative by immunohistochemistry at 1+  (17) fulvestrant started 07/01/2018, repeat every 28 days  (a) mid September started palbociclib 125 mg daily, 21/7,   (b) discontinued with evidence of mixed response  (18) carboplatin/gemcitabine days 1 and 8 started 09/29/2018, repeated every 21 days (a) baseline PET scan 09/23/2018 shows progression on prior treatment, measurable disease (b) CA-27-29 is now mildly informative   (c) cycle 2 of carboplatin/gemcitabine delayed 1 week   PLAN:   [name] is struggling to get through her treatments.  It does not help February I am not getting any obvious signs of improvement on physical exam.  The tumor marker did more or less plateau and that could be an initial indication of possible response.  It is significant that her functional status did not improve with transfusion.  She is aware that a lot of this has to do with depression and discouragement and it is difficult to remain positive when the treatment is very unpleasant and they reward very uncertain.  She is already scheduled for a PET scan on 12/10/2018.  I am scheduling her to see me the next day to discuss results.  If we are seeing disease progression we will consider changing therapy versus discontinuing therapy.  She apparently has been considering a 40-month"vacation" from treatment, which might be possible  if we do not have significant visceral disease.  On the other hand my recommendation will be to continue treatment either with the same agents if we are documenting control or with different agents if not  She knows  to call for any other problems that may develop before the next visit.   [name], [name], MD  11/23/18 1:45 PM Medical Oncology and Hematology [facility] [address] [place], [place] [address] Tel. [phone]    Fax. [phone]  I, Jacqualyn Posey am acting as a Education administrator for Chauncey Cruel, MD.   I, Lurline Del MD, have reviewed the above documentation for accuracy and completeness, and I agree with the above.

## 2018-11-23 ENCOUNTER — Inpatient Hospital Stay (HOSPITAL_BASED_OUTPATIENT_CLINIC_OR_DEPARTMENT_OTHER): Payer: BLUE CROSS/BLUE SHIELD | Admitting: Oncology

## 2018-11-23 ENCOUNTER — Telehealth: Payer: Self-pay | Admitting: Oncology

## 2018-11-23 ENCOUNTER — Inpatient Hospital Stay: Payer: BLUE CROSS/BLUE SHIELD | Attending: Oncology

## 2018-11-23 ENCOUNTER — Inpatient Hospital Stay: Payer: BLUE CROSS/BLUE SHIELD

## 2018-11-23 VITALS — BP 148/62 | HR 87 | Temp 98.3°F | Resp 18 | Ht 69.0 in | Wt 204.7 lb

## 2018-11-23 DIAGNOSIS — C50212 Malignant neoplasm of upper-inner quadrant of left female breast: Secondary | ICD-10-CM

## 2018-11-23 DIAGNOSIS — Z170 Estrogen receptor positive status [ER+]: Secondary | ICD-10-CM

## 2018-11-23 DIAGNOSIS — Z5111 Encounter for antineoplastic chemotherapy: Secondary | ICD-10-CM | POA: Insufficient documentation

## 2018-11-23 DIAGNOSIS — C7951 Secondary malignant neoplasm of bone: Secondary | ICD-10-CM

## 2018-11-23 DIAGNOSIS — Z5112 Encounter for antineoplastic immunotherapy: Secondary | ICD-10-CM | POA: Insufficient documentation

## 2018-11-23 DIAGNOSIS — C50912 Malignant neoplasm of unspecified site of left female breast: Secondary | ICD-10-CM

## 2018-11-23 DIAGNOSIS — Z5189 Encounter for other specified aftercare: Secondary | ICD-10-CM | POA: Diagnosis not present

## 2018-11-23 DIAGNOSIS — Z95828 Presence of other vascular implants and grafts: Secondary | ICD-10-CM

## 2018-11-23 LAB — COMPREHENSIVE METABOLIC PANEL
ALT: 25 U/L (ref 0–44)
AST: 18 U/L (ref 15–41)
Albumin: 4.1 g/dL (ref 3.5–5.0)
Alkaline Phosphatase: 144 U/L — ABNORMAL HIGH (ref 38–126)
Anion gap: 9 (ref 5–15)
BILIRUBIN TOTAL: 0.5 mg/dL (ref 0.3–1.2)
BUN: 18 mg/dL (ref 8–23)
CO2: 27 mmol/L (ref 22–32)
Calcium: 9.5 mg/dL (ref 8.9–10.3)
Chloride: 104 mmol/L (ref 98–111)
Creatinine, Ser: 0.81 mg/dL (ref 0.44–1.00)
GFR calc Af Amer: >60 mL/min (ref >60)
Glucose, Bld: 102 mg/dL — ABNORMAL HIGH (ref 70–99)
Potassium: 4.2 mmol/L (ref 3.5–5.1)
Sodium: 140 mmol/L (ref 135–145)
Total Protein: 7.1 g/dL (ref 6.5–8.1)

## 2018-11-23 LAB — CBC WITH DIFFERENTIAL/PLATELET
Abs Immature Granulocytes: 0.03 10*3/uL (ref 0.00–0.07)
Basophils Absolute: 0.1 10*3/uL (ref 0.0–0.1)
Basophils Relative: 1 %
Eosinophils Absolute: 0.1 10*3/uL (ref 0.0–0.5)
Eosinophils Relative: 1 %
HEMATOCRIT: 33.1 % — AB (ref 36.0–46.0)
Hemoglobin: 10.7 g/dL — ABNORMAL LOW (ref 12.0–15.0)
Immature Granulocytes: 1 %
Lymphocytes Relative: 14 %
Lymphs Abs: 0.9 10*3/uL (ref 0.7–4.0)
MCH: 30.9 pg (ref 26.0–34.0)
MCHC: 32.3 g/dL (ref 30.0–36.0)
MCV: 95.7 fL (ref 80.0–100.0)
MONOS PCT: 9 %
Monocytes Absolute: 0.5 10*3/uL (ref 0.1–1.0)
Neutro Abs: 4.7 10*3/uL (ref 1.7–7.7)
Neutrophils Relative %: 74 %
Platelets: 268 10*3/uL (ref 150–400)
RBC: 3.46 MIL/uL — ABNORMAL LOW (ref 3.87–5.11)
RDW: 18.6 % — ABNORMAL HIGH (ref 11.5–15.5)
WBC: 6.3 10*3/uL (ref 4.0–10.5)
nRBC: 0 % (ref 0.0–0.2)

## 2018-11-23 MED ORDER — ACETAMINOPHEN 325 MG PO TABS
ORAL_TABLET | ORAL | Status: AC
  Filled 2018-11-23: qty 2

## 2018-11-23 MED ORDER — SODIUM CHLORIDE 0.9% FLUSH
10.0000 mL | INTRAVENOUS | Status: DC | PRN
  Administered 2018-11-23: 10 mL
  Filled 2018-11-23: qty 10

## 2018-11-23 MED ORDER — SODIUM CHLORIDE 0.9 % IV SOLN
Freq: Once | INTRAVENOUS | Status: AC
  Administered 2018-11-23: 15:00:00 via INTRAVENOUS
  Filled 2018-11-23: qty 250

## 2018-11-23 MED ORDER — SODIUM CHLORIDE 0.9 % IV SOLN
265.8000 mg | Freq: Once | INTRAVENOUS | Status: AC
  Administered 2018-11-23: 270 mg via INTRAVENOUS
  Filled 2018-11-23: qty 27

## 2018-11-23 MED ORDER — ACETAMINOPHEN 325 MG PO TABS
650.0000 mg | ORAL_TABLET | Freq: Once | ORAL | Status: AC
  Administered 2018-11-23: 650 mg via ORAL

## 2018-11-23 MED ORDER — DIPHENHYDRAMINE HCL 25 MG PO CAPS
ORAL_CAPSULE | ORAL | Status: AC
  Filled 2018-11-23: qty 1

## 2018-11-23 MED ORDER — PALONOSETRON HCL INJECTION 0.25 MG/5ML
0.2500 mg | Freq: Once | INTRAVENOUS | Status: AC
  Administered 2018-11-23: 0.25 mg via INTRAVENOUS

## 2018-11-23 MED ORDER — DIPHENHYDRAMINE HCL 25 MG PO CAPS
25.0000 mg | ORAL_CAPSULE | Freq: Once | ORAL | Status: AC
  Administered 2018-11-23: 25 mg via ORAL

## 2018-11-23 MED ORDER — TRASTUZUMAB CHEMO 150 MG IV SOLR
6.0000 mg/kg | Freq: Once | INTRAVENOUS | Status: AC
  Administered 2018-11-23: 567 mg via INTRAVENOUS
  Filled 2018-11-23: qty 27

## 2018-11-23 MED ORDER — SODIUM CHLORIDE 0.9 % IV SOLN
2000.0000 mg | Freq: Once | INTRAVENOUS | Status: AC
  Administered 2018-11-23: 2000 mg via INTRAVENOUS
  Filled 2018-11-23: qty 52.6

## 2018-11-23 MED ORDER — HEPARIN SOD (PORK) LOCK FLUSH 100 UNIT/ML IV SOLN
500.0000 [IU] | Freq: Once | INTRAVENOUS | Status: AC | PRN
  Administered 2018-11-23: 500 [IU]
  Filled 2018-11-23: qty 5

## 2018-11-23 MED ORDER — DEXAMETHASONE SODIUM PHOSPHATE 10 MG/ML IJ SOLN
10.0000 mg | Freq: Once | INTRAMUSCULAR | Status: AC
  Administered 2018-11-23: 10 mg via INTRAVENOUS

## 2018-11-23 MED ORDER — PALONOSETRON HCL INJECTION 0.25 MG/5ML
INTRAVENOUS | Status: AC
  Filled 2018-11-23: qty 5

## 2018-11-23 MED ORDER — DEXAMETHASONE SODIUM PHOSPHATE 10 MG/ML IJ SOLN
INTRAMUSCULAR | Status: AC
  Filled 2018-11-23: qty 1

## 2018-11-23 NOTE — Telephone Encounter (Signed)
Gave avs and calendar ° °

## 2018-11-23 NOTE — Patient Instructions (Signed)
[facility] Discharge Instructions for Patients Receiving Chemotherapy  Today you received the following chemotherapy agents Gemzar, Carboplatin, and Herceptin.   To help prevent nausea and vomiting after your treatment, we encourage you to take your nausea medication as directed.   If you develop nausea and vomiting that is not controlled by your nausea medication, call the clinic.   BELOW ARE SYMPTOMS THAT SHOULD BE REPORTED IMMEDIATELY:  *FEVER GREATER THAN 100.5 F  *CHILLS WITH OR WITHOUT FEVER  NAUSEA AND VOMITING THAT IS NOT CONTROLLED WITH YOUR NAUSEA MEDICATION  *UNUSUAL SHORTNESS OF BREATH  *UNUSUAL BRUISING OR BLEEDING  TENDERNESS IN MOUTH AND THROAT WITH OR WITHOUT PRESENCE OF ULCERS  *URINARY PROBLEMS  *BOWEL PROBLEMS  UNUSUAL RASH Items with * indicate a potential emergency and should be followed up as soon as possible.  Feel free to call the clinic should you have any questions or concerns. The clinic phone number is [phone].  Please show the [facility] at check-in to the Emergency Department and triage nurse.

## 2018-11-25 ENCOUNTER — Inpatient Hospital Stay: Payer: BLUE CROSS/BLUE SHIELD

## 2018-11-25 VITALS — BP 138/73 | HR 96 | Temp 98.7°F | Resp 18

## 2018-11-25 DIAGNOSIS — C50212 Malignant neoplasm of upper-inner quadrant of left female breast: Secondary | ICD-10-CM

## 2018-11-25 DIAGNOSIS — C50912 Malignant neoplasm of unspecified site of left female breast: Secondary | ICD-10-CM

## 2018-11-25 DIAGNOSIS — Z5112 Encounter for antineoplastic immunotherapy: Secondary | ICD-10-CM | POA: Diagnosis not present

## 2018-11-25 DIAGNOSIS — Z170 Estrogen receptor positive status [ER+]: Secondary | ICD-10-CM

## 2018-11-25 MED ORDER — PEGFILGRASTIM-CBQV 6 MG/0.6ML ~~LOC~~ SOSY
PREFILLED_SYRINGE | SUBCUTANEOUS | Status: AC
  Filled 2018-11-25: qty 0.6

## 2018-11-25 MED ORDER — PEGFILGRASTIM-CBQV 6 MG/0.6ML ~~LOC~~ SOSY
6.0000 mg | PREFILLED_SYRINGE | Freq: Once | SUBCUTANEOUS | Status: AC
  Administered 2018-11-25: 6 mg via SUBCUTANEOUS

## 2018-11-25 NOTE — Patient Instructions (Signed)
Pegfilgrastim injection  What is this medicine?  PEGFILGRASTIM (PEG fil gra stim) is a long-acting granulocyte colony-stimulating factor that stimulates the growth of neutrophils, a type of white blood cell important in the body's fight against infection. It is used to reduce the incidence of fever and infection in patients with certain types of cancer who are receiving chemotherapy that affects the bone marrow, and to increase survival after being exposed to high doses of radiation.  This medicine may be used for other purposes; ask your health care provider or pharmacist if you have questions.  COMMON BRAND NAME(S): Fulphila, Neulasta, UDENYCA  What should I tell my health care provider before I take this medicine?  They need to know if you have any of these conditions:  -kidney disease  -latex allergy  -ongoing radiation therapy  -sickle cell disease  -skin reactions to acrylic adhesives (On-Body Injector only)  -an unusual or allergic reaction to pegfilgrastim, filgrastim, other medicines, foods, dyes, or preservatives  -pregnant or trying to get pregnant  -breast-feeding  How should I use this medicine?  This medicine is for injection under the skin. If you get this medicine at home, you will be taught how to prepare and give the pre-filled syringe or how to use the On-body Injector. Refer to the patient Instructions for Use for detailed instructions. Use exactly as directed. Tell your healthcare provider immediately if you suspect that the On-body Injector may not have performed as intended or if you suspect the use of the On-body Injector resulted in a missed or partial dose.  It is important that you put your used needles and syringes in a special sharps container. Do not put them in a trash can. If you do not have a sharps container, call your pharmacist or healthcare provider to get one.  Talk to your pediatrician regarding the use of this medicine in children. While this drug may be prescribed for  selected conditions, precautions do apply.  Overdosage: If you think you have taken too much of this medicine contact a poison control center or emergency room at once.  NOTE: This medicine is only for you. Do not share this medicine with others.  What if I miss a dose?  It is important not to miss your dose. Call your doctor or health care professional if you miss your dose. If you miss a dose due to an On-body Injector failure or leakage, a new dose should be administered as soon as possible using a single prefilled syringe for manual use.  What may interact with this medicine?  Interactions have not been studied.  Give your health care provider a list of all the medicines, herbs, non-prescription drugs, or dietary supplements you use. Also tell them if you smoke, drink alcohol, or use illegal drugs. Some items may interact with your medicine.  This list may not describe all possible interactions. Give your health care provider a list of all the medicines, herbs, non-prescription drugs, or dietary supplements you use. Also tell them if you smoke, drink alcohol, or use illegal drugs. Some items may interact with your medicine.  What should I watch for while using this medicine?  You may need blood work done while you are taking this medicine.  If you are going to need a MRI, CT scan, or other procedure, tell your doctor that you are using this medicine (On-Body Injector only).  What side effects may I notice from receiving this medicine?  Side effects that you should report to   your doctor or health care professional as soon as possible:  -allergic reactions like skin rash, itching or hives, swelling of the face, lips, or tongue  -back pain  -dizziness  -fever  -pain, redness, or irritation at site where injected  -pinpoint red spots on the skin  -red or dark-brown urine  -shortness of breath or breathing problems  -stomach or side pain, or pain at the shoulder  -swelling  -tiredness  -trouble passing urine or  change in the amount of urine  Side effects that usually do not require medical attention (report to your doctor or health care professional if they continue or are bothersome):  -bone pain  -muscle pain  This list may not describe all possible side effects. Call your doctor for medical advice about side effects. You may report side effects to FDA at 1-800-FDA-1088.  Where should I keep my medicine?  Keep out of the reach of children.  If you are using this medicine at home, you will be instructed on how to store it. Throw away any unused medicine after the expiration date on the label.  NOTE: This sheet is a summary. It may not cover all possible information. If you have questions about this medicine, talk to your doctor, pharmacist, or health care provider.   2019 Elsevier/Gold Standard (2018-01-12 16:57:08)

## 2018-12-07 ENCOUNTER — Other Ambulatory Visit: Payer: Self-pay | Admitting: Oncology

## 2018-12-07 DIAGNOSIS — C50212 Malignant neoplasm of upper-inner quadrant of left female breast: Secondary | ICD-10-CM

## 2018-12-07 DIAGNOSIS — Z170 Estrogen receptor positive status [ER+]: Principal | ICD-10-CM

## 2018-12-10 ENCOUNTER — Other Ambulatory Visit: Payer: Self-pay | Admitting: Oncology

## 2018-12-10 ENCOUNTER — Encounter (HOSPITAL_COMMUNITY): Payer: Self-pay

## 2018-12-10 ENCOUNTER — Ambulatory Visit (HOSPITAL_COMMUNITY)
Admission: RE | Admit: 2018-12-10 | Discharge: 2018-12-10 | Disposition: A | Discharge status: Home / Self Care | Payer: BLUE CROSS/BLUE SHIELD | Source: Ambulatory Visit | Attending: Oncology | Admitting: Oncology

## 2018-12-10 DIAGNOSIS — C50912 Malignant neoplasm of unspecified site of left female breast: Secondary | ICD-10-CM | POA: Insufficient documentation

## 2018-12-10 LAB — GLUCOSE, CAPILLARY: Glucose-Capillary: 113 mg/dL — ABNORMAL HIGH (ref 70–99)

## 2018-12-10 MED ORDER — FLUDEOXYGLUCOSE F - 18 (FDG) INJECTION
10.1600 | Freq: Once | INTRAVENOUS | Status: AC | PRN
  Administered 2018-12-10: 10.16 via INTRAVENOUS

## 2018-12-10 NOTE — Progress Notes (Signed)
[name]  [phone] [phone]) [phone]    ID: CLEORA KARNIK DOB: [birth year]  MR#: [id number]  AJO#:[id number]  Patient Care Team: Chesley Noon, MD as PCP - General (Family Medicine) Excell Seltzer, MD as Consulting Physician (General Surgery) , [name], MD as Consulting Physician (Oncology) Gery Pray, MD as Consulting Physician (Radiation Oncology) Collene Gobble, MD as Consulting Physician (Pulmonary Disease) Verdell Carmine, MD as Referring Physician (Internal Medicine) Larey Dresser, MD as Consulting Physician (Cardiology) OTHER MD: Dr Lyda Jester [phone])   CHIEF COMPLAINT: estrogen receptor positive, HER-2 positive breast cancer; triple negative breast cancers  CURRENT TREATMENT: Adjuvant Trastuzumab,  zoledronate, carboplatin/gemcitabine   INTERVAL HISTORY: [name] returns today for follow-up and treatment of her triple negative breast cancer. She is accompanied by her husband.  She has been on carboplatin/gemcitabine, received day 1 and day 8 of each 21-day cycle, between 09/29/2018 and 11/23/2018.  Unfortunately since her last visit here, she underwent a restaging PET scan on 12/10/2018 showing new hypermetabolic subcutaneous nodules in the medial RIGHT chest wall superficial to the musculature. RIGHT mastectomy anatomy. Stable hypermetabolic mediastinal and hilar nodal metastasis. No significant improvement or worsening. Stable hypermetabolic LEFT internal mammary lymph node. Post radiation change in LEFT upper lobe. Sclerotic hypermetabolic sternal metastasis slightly increased in size. No clear evidence of skeletal metastasis elsewhere. Increase in marrow activity suggest anemia or GCSF type response.  She receives zoledronate every 12 weeks, with her most recent dose 09/24/2018.   She also receives trastuzumab, with most recent dose on 11/23/2018.    Oluwatomisin's last echocardiogram on 08/17/2018, showed  an ejection fraction in the 60% - 65% range. She will not need a repeat echocardiogram until 01/2019.    In addition to progression on the recent scan her tumor markers have been steadily rising.  Results for [name], [name] (MRN [id number]) as of 12/11/2018 11:22  Ref. Range 07/28/2018 10:47 08/26/2018 08:28 09/24/2018 07:47 10/26/2018 08:20 11/16/2018 11:56  CA 27.29 Latest Ref Range: 0.0 - 38.6 U/mL 60.7 (H) 71.6 (H) 86.0 (H) 130.9 (H) 138.9 (H)     REVIEW OF SYSTEMS: [name] says her back has been hurting, almost like she hurt her back lifting something even though she hasn't, which she has been treating with ibuprofen. She has a small spot on her right breast, but she is unsure if it is from her bra rubbing her skin or not. She is also extremely fatigued, but she sleeps well. Her appetite and sense of taste are great. She as neuropathy in her hands and feet, which has affected her ability to dress herself and her balance. The patient denies unusual headaches, visual changes, nausea, vomiting, or dizziness. There has been no unusual cough, phlegm production, or pleurisy. This been no change in bowel or bladder habits. The patient denies unexplained weight loss, bleeding, rash, or fever. A detailed review of systems was otherwise noncontributory.    BREAST CANCER HISTORY: From the original intake note:  "[name]" had a fall while traveling living to [place] in their RV and injure her left upper arm and  her left breast . Approximately 3 weeks later she noted a mass in the left breast upper inner quadrant. It was somewhat tender. She brought this to medical attention and on 09/27/2016 underwent bilateral diagnostic mammography with tomography [facility] The breast density was category B. The right breast was benign. In the left breast upper inner quadrant there was a large  lobulated hyperdense mass which by exam was firm and nonmobile. By ultrasonography in the 11:30  o'clock radius 4 cm from the nipple there was a mass extending to the subcutaneous tissues and measuring 4.9 cm. There was a small superficial fluid collection overlying this consistent with a hematoma. The left axilla was benign.  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  The patient's subsequent history is as detailed below   PAST MEDICAL HISTORY: Past Medical History:  Diagnosis Date  . Anxiety   . CAP (community acquired pneumonia)   . Depression   . Malignant neoplasm of upper-inner quadrant of left female breast ([name]) 10/01/2016  . Personal history of chemotherapy   . Tobacco use     PAST SURGICAL HISTORY: Past Surgical History:  Procedure Laterality Date  . ABDOMINAL HYSTERECTOMY  2011   Laparoscopic  . AUGMENTATION MAMMAPLASTY Bilateral 03/2017  . BREAST LUMPECTOMY Left 04/05/2017  . BREAST LUMPECTOMY WITH SENTINEL LYMPH NODE BIOPSY Left 04/07/2017  . BREAST REDUCTION SURGERY Left 04/07/2017  . CHOLECYSTECTOMY    . MASTECTOMY Bilateral 08/25/2017   at [facility]  . open reduction left ankle    . SKIN GRAFT Left 06/19/2017   bil skin grafts to breasts    FAMILY HISTORY: Family History  Problem Relation Age of Onset  . Heart disease Mother   . Heart disease Father   . Cancer Sister        ovarian ca   The patient's father died at the age of 59 from a myocardial infarction. The patient's mother died at the age of 8 with congestive heart failure. The patient had one brother, 2 sisters. One of her sisters was diagnosed with ovarian cancer at age 63 and died at age 58 from that disease   GYNECOLOGIC HISTORY:  No LMP recorded. Patient has had a hysterectomy. Menarche age 39, first live  birth age 11. The patient is GX P1. She stopped having periods approximately 2008. She did not take hormone replacement. She used oral contraceptives for approximately 30 years with no complications.   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 Her husband [name] is retired from [employer]. Their daughter Melina Mosteller, used to live in [name], but has moved back to [place]. [name]'s husband works in Engineer, technical sales for healthcare systems. The patient welcomed her 1st grandchild, a boy named Eriksen in May 2019.       ADVANCED DIRECTIVES: not in place   HEALTH MAINTENANCE: Social History   Tobacco Use  . Smoking status: Former Smoker    Packs/day: 0.50    Years: 20.00    Pack years: 10.00    Last attempt to quit: 10/21/2016    Years since quitting: 2.1  . Smokeless tobacco: Never Used  Substance Use Topics  . Alcohol use: No  . Drug use: No     Colonoscopy:  PAP:  Bone density:   No Known Allergies  Current Outpatient Medications  Medication Sig Dispense Refill  . acetaminophen (TYLENOL) 325 MG tablet Take 650 mg by mouth every 6 (six) hours as needed for moderate pain.     Marland Kitchen ALPRAZolam (XANAX XR) 1 MG 24 hr tablet TABLET BY MOUTH EVERY DAY  30 tablet 1  . azithromycin (ZITHROMAX) 250 MG tablet Take 2 tablets day one, then one tablet daily 6 each 0  . busPIRone (BUSPAR) 30 MG tablet Take 1 tablet (30 mg total) by mouth 3 (three) times daily. 90 tablet 4  . diphenhydrAMINE (BENADRYL) 25 MG tablet Take 50 mg by mouth as needed for sleep.     Marland Kitchen ibuprofen (ADVIL,MOTRIN) 200 MG tablet Take 600 mg by mouth every 6 (six) hours as needed for moderate pain.     . magic mouthwash w/lidocaine SOLN Take 15 mLs by mouth 3 (three) times daily as needed for mouth pain. 240 mL 0  . omeprazole (PRILOSEC) 40 MG capsule Take 1 capsule (40 mg total) by mouth daily. 60 capsule 2  . ondansetron (ZOFRAN) 8 MG tablet Take 1 tablet (8 mg total)  by mouth 2 (two) times daily as needed for refractory nausea / vomiting. Start on day 3 after chemotherapy. 30 tablet 1  . PARoxetine (PAXIL) 40 MG tablet Take 40 mg by mouth every morning.    . prochlorperazine (COMPAZINE) 10 MG tablet Take 1 tablet (10 mg total) by mouth every 6 (six) hours as needed (Nausea or vomiting). 30 tablet 1  . ranitidine (ZANTAC) 150 MG tablet Take 150 mg by mouth daily as needed for heartburn.     Marland Kitchen tiZANidine (ZANAFLEX) 4 MG tablet TAKE 1 TABLET BY MOUTH EVERYDAY AT BEDTIME 30 tablet 1  . valACYclovir (VALTREX) 1000 MG tablet Take 1 tablet (1,000 mg total) by mouth daily. 90 tablet 2   No current facility-administered medications for this visit.    Facility-Administered Medications Ordered in Other Visits  Medication Dose Route Frequency Provider Last Rate Last Dose  . sodium chloride flush (NS) 0.9 % injection 10 mL  10 mL Intracatheter Once , [name], MD         OBJECTIVE: Middle-aged white woman who appears stated age  25:   12/11/18 1118  BP: (!) 152/92  Pulse: (!) 116  Resp: 18  Temp: 98.5 F (36.9 C)  SpO2: 100%     Body mass index is 30.76 kg/m.   Filed Weights   12/11/18 1118  Weight: 208 lb 4.8 oz (94.5 kg)     ECOG FS:2 - Symptomatic, <50% confined to bed   Sclerae unicteric, pupils round and equal Oropharynx clear and moist No cervical or supraclavicular adenopathy Lungs no rales or rhonchi Heart regular rate and rhythm Abd soft, nontender, positive bowel sounds MSK no focal spinal tenderness Neuro: nonfocal, well oriented, appropriate affect Breasts: Status post bilateral mastectomies.  The local recurrence area chiefly in the medial aspect of the left chest wall scars continues to progress.  There are lesions on the right side as well   Photo 10/19/2018     Chest Wall 09/24/2018 (the right side is up on this photo)     Chest wall 06/29/2018     LAB RESULTS:  CMP     Component Value Date/Time   NA  140 11/23/2018 1302   NA 140 10/24/2017 1307   K 4.2 11/23/2018 1302   K 5.4 (H) 10/24/2017 1307   CL 104 11/23/2018 1302   CO2 27 11/23/2018 1302   CO2 29 10/24/2017 1307   GLUCOSE 102 (H) 11/23/2018 1302   GLUCOSE 103 10/24/2017 1307   BUN 18 11/23/2018 1302   BUN 18.2 10/24/2017 1307   CREATININE 0.81 11/23/2018 1302   CREATININE 0.8 10/24/2017 1307   CALCIUM 9.5 11/23/2018 1302  CALCIUM 10.3 10/24/2017 1307   PROT 7.1 11/23/2018 1302   PROT 7.5 10/24/2017 1307   ALBUMIN 4.1 11/23/2018 1302   ALBUMIN 4.0 10/24/2017 1307   AST 18 11/23/2018 1302   AST 9 10/24/2017 1307   ALT 25 11/23/2018 1302   ALT 8 10/24/2017 1307   ALKPHOS 144 (H) 11/23/2018 1302   ALKPHOS 115 10/24/2017 1307   BILITOT 0.5 11/23/2018 1302   BILITOT 0.28 10/24/2017 1307   GFRNONAA >60 11/23/2018 1302   GFRAA >60 11/23/2018 1302    INo results found for: SPEP, UPEP  Lab Results  Component Value Date   WBC 6.3 11/23/2018   NEUTROABS 4.7 11/23/2018   HGB 10.7 (L) 11/23/2018   HCT 33.1 (L) 11/23/2018   MCV 95.7 11/23/2018   PLT 268 11/23/2018      Chemistry      Component Value Date/Time   NA 140 11/23/2018 1302   NA 140 10/24/2017 1307   K 4.2 11/23/2018 1302   K 5.4 (H) 10/24/2017 1307   CL 104 11/23/2018 1302   CO2 27 11/23/2018 1302   CO2 29 10/24/2017 1307   BUN 18 11/23/2018 1302   BUN 18.2 10/24/2017 1307   CREATININE 0.81 11/23/2018 1302   CREATININE 0.8 10/24/2017 1307      Component Value Date/Time   CALCIUM 9.5 11/23/2018 1302   CALCIUM 10.3 10/24/2017 1307   ALKPHOS 144 (H) 11/23/2018 1302   ALKPHOS 115 10/24/2017 1307   AST 18 11/23/2018 1302   AST 9 10/24/2017 1307   ALT 25 11/23/2018 1302   ALT 8 10/24/2017 1307   BILITOT 0.5 11/23/2018 1302   BILITOT 0.28 10/24/2017 1307       No results found for: LABCA2  No components found for: LABCA125  No results for input(s): INR in the last 168 hours.  Urinalysis    Component Value Date/Time   COLORURINE STRAW  (A) 10/30/2017 2050   APPEARANCEUR CLEAR 10/30/2017 2050   LABSPEC 1.006 10/30/2017 2050   [place] 6.0 10/30/2017 2050   GLUCOSEU NEGATIVE 10/30/2017 2050   [place] NEGATIVE 10/30/2017 2050   [place] NEGATIVE 10/30/2017 2050   [place] 10/30/2017 2050   PROTEINUR NEGATIVE 10/30/2017 2050   NITRITE NEGATIVE 10/30/2017 2050   LEUKOCYTESUR NEGATIVE 10/30/2017 2050     STUDIES: Nm Pet Image Restag (ps) Skull Base To Thigh  Result Date: 12/10/2018 CLINICAL DATA:  Subsequent treatment strategy for breast carcinoma. EXAM: NUCLEAR MEDICINE PET SKULL BASE TO THIGH TECHNIQUE: 10.2 mCi F-18 FDG was injected intravenously. Full-ring PET imaging was performed from the skull base to thigh after the radiotracer. CT data was obtained and used for attenuation correction and anatomic localization. Fasting blood glucose: 113 mg/dl COMPARISON:  PET-CT 09/23/2018 FINDINGS: Mediastinal blood pool activity: SUV max 2.9 NECK: No hypermetabolic lymph nodes in the neck. Incidental CT findings: none CHEST: Several new enlarged hypermetabolic nodules within the RIGHT chest wall centrally and medially towards the sternum. Example nodule measuring 14 mm (image 84/4). More medial nodule in the RIGHT chest wall measuring 9 mm (image 87/4). These nodules hypermetabolic with SUV max equal 5.5. Nodules are new from comparison exam. Again demonstrated hypermetabolic mediastinal lymph nodes not significant changed from prior. Subcarinal lymph node is intense with SUV max equal 7.8 compared to SUV max equal 9.2. Hypermetabolic RIGHT hilar lymph node with SUV max equal 7.7 compared to 6.9. No new hypermetabolic mediastinal nodes. Internal mammary lymph node on the LEFT with SUV max equal 3.7 compares to SUV  max equal 3.9 for no change. There is peripheral band of angular consolidation in the LEFT upper lobe with mild metabolic activity (SUV max equal 2.6 most consists with post radiation change. Incidental CT findings: No new  or suspicious pulmonary nodules ABDOMEN/PELVIS: No abnormal hypermetabolic activity within the liver, pancreas, adrenal glands, or spleen. No hypermetabolic lymph nodes in the abdomen or pelvis. Incidental CT findings: none SKELETON: A sclerotic focus in the central sternum with SUV max equal 5.0 compares to 4.6. Lesion is slightly increased in size with a sclerotic rim measuring 15 mm (image 80/4) compared 12 mm on prior. No clear additional sclerotic skeletal metastasis. There is increased marrow activity in the humeri pelvis which could indicate anemia or GCSF type response. Incidental CT findings: none IMPRESSION: 1. 1. New hypermetabolic subcutaneous nodules in the medial RIGHT chest wall superficial to the musculature. RIGHT mastectomy anatomy. 2. Stable hypermetabolic mediastinal and hilar nodal metastasis. No significant improvement or worsening. 3. Stable hypermetabolic LEFT internal mammary lymph node. 4. Post radiation change in LEFT upper lobe. 5. Sclerotic hypermetabolic sternal metastasis slightly increased in size. 6. No clear evidence of skeletal metastasis elsewhere. Increase in marrow activity suggest anemia or GCSF type response. Electronically Signed   By: Suzy Bouchard M.D.   On: 12/10/2018 12:38    ELIGIBLE FOR AVAILABLE RESEARCH PROTOCOL: no   ASSESSMENT: 63 y.o. [employer], [place] woman status post left breast upper inner quadrant biopsy 09/30/2016 for a clinical T2 N0, stage II a invasive ductal carcinoma, grade 3, estrogen receptor positive, progesterone receptor negative, HER-2 not amplified, with an MIB-1 of 80%.  (1) genetics testing not actionable (see below).  (2) neoadjuvant chemotherapy given 10/30/2016 to 03/12/2017 consisting of cyclophosphamide and doxorubicin in dose dense fashion 4  followed by paclitaxel 1, then Abraxane (x8?)  further treatments discontinued because of neuropathy.  (3) status post left lumpectomy with oncoplasty and right reduction mammoplasty  showing a residual T2 N1 invasive ductal carcinoma, with lymphovascular invasion, but negative margins; the tumor is now HER-2 positive.  (a) postop PET scan showed no evidence of metastatic disease.  (4) adjuvant trastuzumab/pertuzumab started 06/20/2017 (received at [facility]), started here on 07/11/2017--continued through April 2019 when patient left for [place]  (a) Echo on 06/18/2017 at [facility]: LVEF 55-60%  (b) echocardiogram 09/26/2017 shows an ejection fraction in the 60-65% range.  (c) echocardiogram 12/26/2017 shows an ejection fraction in the 55-60% range  (d) see below for subsequent echocardiogram results   (5) adjuvant radiation was to follow, but see (7) below  (6) anti-estrogens: Letrozole daily started 03/26/2017  (a) DEXA on 04/03/2017 demonstrated a T score of -1.3 in the right femoral neck  (b) Zometa given at [facility] on 06/20/2017  RECURRENCE: (7) Patient developed a left breast nodule and underwent mammogram and ultrasound on 08/06/17 showing three areas of concern at 10 o'clock, 1130 o'clock, and at 8 o'clock. Biopsy on 08/07/2017 demonstrated at 11:30 fat necrosis, however at 10:00 it was positive for IDC, grade 3, ER+(50%), PR-(0%), Ki-67 90%, HER-2 negative (ratio 1.48).  (a) staging chest CT 08/15/2017 shows 0.5 cm left lower lobe lung nodule of uncertain significance  (8) status post left modified radical mastectomy and right simple mastectomy 08/25/2017 showing  (a) right breast, no evidence of malignancy  (b) left breast, ypT2 ypN1 invasive ductal carcinoma, grade 3, with negative margins; repeat prognostic panel: Triple negative  (9) started adjuvant chemotherapy with cyclophosphamide/methotrexate/fluorouracil [CMF] 11/10/2016, eight cycles planned  (a) continuing trastuzumab/pertuzumab   (b) CMF chemotherapy  discontinued after 4 cycles with disease progression  (10) status post left chest wall biopsy for further local recurrence 01/05/2018, the tumor being now weakly  estrogen receptor positive, progesterone receptor negative, and again HER-2 negative  (11) PET scan 03/25/2018 negative except for a 1.2 cm left internal mammary lymph node, SUV 6.9  (12) adjuvant radiation completed 03/19/2018 (30 doses to left chest wall area under Dr Modesto Charon at [facility]  (a) did not receive capecitabine sensitization  (13)  Letrozole resumed 03/27/2018--discontinued 06/29/2018  (14) foundation one 01/09/2018 shows no actionable mutations, with stable microsatellite status and low mutational burden.  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 (a) PD-L1 testing on immune cells, 0% staining (negative)--obtained on 08/07/2017 cycle, which was weakly ER positive, progesterone and HER-2 negative (b) genetics testing through [facility] October 31, 2016 found no deleterious mutations in ATM, BRCA1, BRCA2, BRI P1, CDH 1, CH EK 2, EPCAM, MLH1, MSH2, MSH6, NBN, NF1, PALB 2, PMS2, PTEN, RAD 51C, RAD 51D, STK 1 1, or TP 53.  (15) trastuzumab and pertuzumab resumed 03/31/2018 (a) echocardiogram on 04/20/2018 showed an ejection fraction in the 50-55% range.  (b) changed to every 4-week treatments beginning 07/01/2018 to coordinate with fulvestrant (c) echocardiogram 08/17/2018 shows a well-preserved ejection fraction. (d) pertuzumab discontinued as of December 2019  (16) fine-needle aspiration of a chest wall lesion on 06/04/2018 was read as "non-small cell carcinoma".  Punch biopsy obtained at the visit 06/08/2018 for prognostic panel determination showed poorly differentiated carcinoma, estrogen receptor positive at 60%, progesterone receptor negative, HER-2 negative by immunohistochemistry at 1+  (17) fulvestrant started 07/01/2018, repeat every 28 days  (a) mid September started palbociclib 125 mg daily, 21/7,   (b) discontinued with evidence of mixed response  (18) carboplatin/gemcitabine days 1 and 8 started 09/29/2018, repeated every 21 days (a) baseline PET scan 09/23/2018  shows progression on prior treatment, measurable disease (b) CA-27-29 is now informative   (c) cycle 2 of carboplatin/gemcitabine delayed 1 week  (d) carboplatin/gemcitabine discontinued after 3 cycles with evidence of disease progression  (19) PET scan 12/10/2018 shows progression in chest wall and sternum, stable disease in nodes and other blytic bone lesions   PLAN:   [name] had a hard time getting through her carboplatin/gemcitabine but she was very motivated and did receive a total of 3 cycles, 6 doses.  Unfortunately there is apparent progression by PET scan and by direct chest wall exam.  She would like to have a "treatment vacation".  She is not averse to receiving further chemotherapy at some point in the future but right now she just does not feel she can continue chemo treatments.  Since we do not have evidence of involvement of the lungs or liver I think this is possible.  Although the most recent biopsies have not been HER-2 positive, I think continuing trastuzumab alone will not cause her any problems and will continue to control what ever part of her tumor is HER-2 positive.  She will receive a treatment 12/14/2018 and then continue every 4 weeks.  Her next echocardiogram will be due in April.  The most recent biopsy also showed the chest wall cancer to be estrogen receptor positive.  We could consider exemestane/everolimus, but at this point we are going to go with tamoxifen, which she has not had, and which I believe she will tolerate well.  I suggested this may be a good time for her to seek a formal second opinion and she is agreeable.  We  will see what [facility] suggests as we move forward.  Otherwise she will return to see me 01/11/2019 with her next trastuzumab dose.   , [name], MD  12/13/18 4:42 PM Medical Oncology and Hematology [facility] [address] [place], [place] [address] Tel. [phone]    Fax. [phone]  I, Jacqualyn Posey am acting  as a Education administrator for Chauncey Cruel, MD.   I, Lurline Del MD, have reviewed the above documentation for accuracy and completeness, and I agree with the above.

## 2018-12-11 ENCOUNTER — Inpatient Hospital Stay (HOSPITAL_BASED_OUTPATIENT_CLINIC_OR_DEPARTMENT_OTHER): Payer: BLUE CROSS/BLUE SHIELD | Admitting: Oncology

## 2018-12-11 VITALS — BP 152/92 | HR 116 | Temp 98.5°F | Resp 18 | Ht 69.0 in | Wt 208.3 lb

## 2018-12-11 DIAGNOSIS — C7951 Secondary malignant neoplasm of bone: Secondary | ICD-10-CM

## 2018-12-11 DIAGNOSIS — G620 Drug-induced polyneuropathy: Secondary | ICD-10-CM

## 2018-12-11 DIAGNOSIS — C50212 Malignant neoplasm of upper-inner quadrant of left female breast: Secondary | ICD-10-CM | POA: Diagnosis not present

## 2018-12-11 DIAGNOSIS — C50912 Malignant neoplasm of unspecified site of left female breast: Secondary | ICD-10-CM

## 2018-12-11 DIAGNOSIS — T451X5A Adverse effect of antineoplastic and immunosuppressive drugs, initial encounter: Secondary | ICD-10-CM

## 2018-12-11 DIAGNOSIS — Z170 Estrogen receptor positive status [ER+]: Secondary | ICD-10-CM | POA: Diagnosis not present

## 2018-12-13 ENCOUNTER — Other Ambulatory Visit: Payer: Self-pay | Admitting: Oncology

## 2018-12-14 ENCOUNTER — Inpatient Hospital Stay: Payer: BLUE CROSS/BLUE SHIELD

## 2018-12-14 ENCOUNTER — Telehealth: Payer: Self-pay | Admitting: Oncology

## 2018-12-14 VITALS — BP 153/70 | HR 81 | Temp 98.8°F | Resp 16

## 2018-12-14 DIAGNOSIS — Z95828 Presence of other vascular implants and grafts: Secondary | ICD-10-CM

## 2018-12-14 DIAGNOSIS — Z170 Estrogen receptor positive status [ER+]: Secondary | ICD-10-CM

## 2018-12-14 DIAGNOSIS — C50912 Malignant neoplasm of unspecified site of left female breast: Secondary | ICD-10-CM

## 2018-12-14 DIAGNOSIS — C50212 Malignant neoplasm of upper-inner quadrant of left female breast: Secondary | ICD-10-CM

## 2018-12-14 DIAGNOSIS — Z5112 Encounter for antineoplastic immunotherapy: Secondary | ICD-10-CM | POA: Diagnosis not present

## 2018-12-14 LAB — CBC WITH DIFFERENTIAL/PLATELET
ABS IMMATURE GRANULOCYTES: 0.19 10*3/uL — AB (ref 0.00–0.07)
BASOS PCT: 1 %
Basophils Absolute: 0.1 10*3/uL (ref 0.0–0.1)
Eosinophils Absolute: 0.2 10*3/uL (ref 0.0–0.5)
Eosinophils Relative: 2 %
HCT: 30.7 % — ABNORMAL LOW (ref 36.0–46.0)
HEMOGLOBIN: 9.5 g/dL — AB (ref 12.0–15.0)
Immature Granulocytes: 2 %
Lymphocytes Relative: 7 %
Lymphs Abs: 0.8 10*3/uL (ref 0.7–4.0)
MCH: 31.3 pg (ref 26.0–34.0)
MCHC: 30.9 g/dL (ref 30.0–36.0)
MCV: 101 fL — ABNORMAL HIGH (ref 80.0–100.0)
MONO ABS: 0.9 10*3/uL (ref 0.1–1.0)
Monocytes Relative: 8 %
Neutro Abs: 9.1 10*3/uL — ABNORMAL HIGH (ref 1.7–7.7)
Neutrophils Relative %: 80 %
Platelets: 352 10*3/uL (ref 150–400)
RBC: 3.04 MIL/uL — AB (ref 3.87–5.11)
RDW: 21.7 % — ABNORMAL HIGH (ref 11.5–15.5)
WBC: 11.3 10*3/uL — AB (ref 4.0–10.5)
nRBC: 0.4 % — ABNORMAL HIGH (ref 0.0–0.2)

## 2018-12-14 LAB — COMPREHENSIVE METABOLIC PANEL
ALT: 12 U/L (ref 0–44)
AST: 17 U/L (ref 15–41)
Albumin: 4 g/dL (ref 3.5–5.0)
Alkaline Phosphatase: 159 U/L — ABNORMAL HIGH (ref 38–126)
Anion gap: 10 (ref 5–15)
BILIRUBIN TOTAL: 0.4 mg/dL (ref 0.3–1.2)
BUN: 14 mg/dL (ref 8–23)
CO2: 23 mmol/L (ref 22–32)
Calcium: 9.2 mg/dL (ref 8.9–10.3)
Chloride: 106 mmol/L (ref 98–111)
Creatinine, Ser: 0.81 mg/dL (ref 0.44–1.00)
GFR calc Af Amer: >60 mL/min (ref >60)
GFR calc non Af Amer: >60 mL/min (ref >60)
Glucose, Bld: 107 mg/dL — ABNORMAL HIGH (ref 70–99)
Potassium: 4.4 mmol/L (ref 3.5–5.1)
Sodium: 139 mmol/L (ref 135–145)
Total Protein: 7 g/dL (ref 6.5–8.1)

## 2018-12-14 MED ORDER — ACETAMINOPHEN 325 MG PO TABS
ORAL_TABLET | ORAL | Status: AC
  Filled 2018-12-14: qty 2

## 2018-12-14 MED ORDER — DIPHENHYDRAMINE HCL 25 MG PO CAPS
ORAL_CAPSULE | ORAL | Status: AC
  Filled 2018-12-14: qty 1

## 2018-12-14 MED ORDER — HEPARIN SOD (PORK) LOCK FLUSH 100 UNIT/ML IV SOLN
500.0000 [IU] | Freq: Once | INTRAVENOUS | Status: AC | PRN
  Administered 2018-12-14: 500 [IU]
  Filled 2018-12-14: qty 5

## 2018-12-14 MED ORDER — SODIUM CHLORIDE 0.9% FLUSH
10.0000 mL | INTRAVENOUS | Status: DC | PRN
  Administered 2018-12-14: 10 mL
  Filled 2018-12-14: qty 10

## 2018-12-14 MED ORDER — SODIUM CHLORIDE 0.9 % IV SOLN
Freq: Once | INTRAVENOUS | Status: AC
  Administered 2018-12-14: 09:00:00 via INTRAVENOUS
  Filled 2018-12-14: qty 250

## 2018-12-14 MED ORDER — ZOLEDRONIC ACID 4 MG/100ML IV SOLN
4.0000 mg | Freq: Once | INTRAVENOUS | Status: AC
  Administered 2018-12-14: 4 mg via INTRAVENOUS
  Filled 2018-12-14: qty 100

## 2018-12-14 MED ORDER — DIPHENHYDRAMINE HCL 25 MG PO CAPS
25.0000 mg | ORAL_CAPSULE | Freq: Once | ORAL | Status: AC
  Administered 2018-12-14: 25 mg via ORAL

## 2018-12-14 MED ORDER — ACETAMINOPHEN 325 MG PO TABS
650.0000 mg | ORAL_TABLET | Freq: Once | ORAL | Status: AC
  Administered 2018-12-14: 650 mg via ORAL

## 2018-12-14 MED ORDER — TRASTUZUMAB CHEMO 150 MG IV SOLR
6.0000 mg/kg | Freq: Once | INTRAVENOUS | Status: AC
  Administered 2018-12-14: 567 mg via INTRAVENOUS
  Filled 2018-12-14: qty 27

## 2018-12-14 NOTE — Telephone Encounter (Signed)
No los °

## 2018-12-14 NOTE — Patient Instructions (Signed)
[facility] Discharge Instructions for Patients Receiving Chemotherapy  Today you received the following chemotherapy agents herceptin   To help prevent nausea and vomiting after your treatment, we encourage you to take your nausea medication as directed   If you develop nausea and vomiting that is not controlled by your nausea medication, call the clinic.   BELOW ARE SYMPTOMS THAT SHOULD BE REPORTED IMMEDIATELY:  *FEVER GREATER THAN 100.5 F  *CHILLS WITH OR WITHOUT FEVER  NAUSEA AND VOMITING THAT IS NOT CONTROLLED WITH YOUR NAUSEA MEDICATION  *UNUSUAL SHORTNESS OF BREATH  *UNUSUAL BRUISING OR BLEEDING  TENDERNESS IN MOUTH AND THROAT WITH OR WITHOUT PRESENCE OF ULCERS  *URINARY PROBLEMS  *BOWEL PROBLEMS  UNUSUAL RASH Items with * indicate a potential emergency and should be followed up as soon as possible.  Feel free to call the clinic you have any questions or concerns. The clinic phone number is [phone].  

## 2018-12-14 NOTE — Progress Notes (Signed)
Per office note, next ECHO due in April.  Pt states ECHOs to be done every 6 months instead of every 3.

## 2018-12-14 NOTE — Telephone Encounter (Signed)
Scheduled appt per 2/23 sch message - pt to get an updated schedule in treatment area.

## 2018-12-15 ENCOUNTER — Other Ambulatory Visit: Payer: Self-pay | Admitting: *Deleted

## 2018-12-15 LAB — CANCER ANTIGEN 27.29: CA 27.29: 203.1 U/mL — ABNORMAL HIGH (ref 0.0–38.6)

## 2018-12-16 ENCOUNTER — Telehealth: Payer: Self-pay | Admitting: Oncology

## 2018-12-16 ENCOUNTER — Telehealth: Payer: Self-pay | Admitting: *Deleted

## 2018-12-16 DIAGNOSIS — C50912 Malignant neoplasm of unspecified site of left female breast: Secondary | ICD-10-CM

## 2018-12-16 MED ORDER — TAMOXIFEN CITRATE 20 MG PO TABS: 20.0000 mg | ORAL_TABLET | Freq: Every day | ORAL | 3 refills | Status: DC

## 2018-12-16 NOTE — Telephone Encounter (Signed)
Faxed medical records to Dr. Alinda Money at [facility] at [facility] [place]

## 2018-12-16 NOTE — Telephone Encounter (Signed)
This RN spoke with pt per pt's inquiry regarding tamoxifen and referral to [facility].  Per MD pt is to be on tamoxifen during chemo break.  He has also emailed Dr Arabella Merles at [facility].  This RN will place a formal referral for records to be sent to above.

## 2018-12-18 ENCOUNTER — Other Ambulatory Visit: Payer: Self-pay | Admitting: Oncology

## 2018-12-28 MED FILL — TOREMIFENE CITRATE 60 MG TA: 60 | 30 days supply | Qty: 30 | Fill #0

## 2019-01-02 ENCOUNTER — Other Ambulatory Visit: Payer: Self-pay | Admitting: Oncology

## 2019-01-08 NOTE — Progress Notes (Signed)
[place]  [phone] [phone]) [phone]    ID: [name] DOB: 06/07/1956  MR#: [id number]  TGG#:[id number]  Patient Care Team: Chesley Noon, MD as PCP - General (Family Medicine) Excell Seltzer, MD as Consulting Physician (General Surgery) [name], Debra Dad, MD as Consulting Physician (Oncology) Gery Pray, MD as Consulting Physician (Radiation Oncology) Collene Gobble, MD as Consulting Physician (Pulmonary Disease) Verdell Carmine, MD as Referring Physician (Internal Medicine) Larey Dresser, MD as Consulting Physician (Cardiology) OTHER MD: Dr Lyda Jester [phone])   CHIEF COMPLAINT: estrogen receptor positive, HER-2 positive breast cancer; 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   She continues on trastuzumab, with her most recent dose received on 12/14/2018. She tolerates this well and without any noticeable side effects.  Dustin's last echocardiogram on 08/17/2018, showed an ejection fraction in the 60% - 65% range.   She also continues on zolendronate, with her most recent dose received on 12/14/2018. She tolerates this well and without any noticeable side effects.    Since her last visit here she sought a second opinion at [facility] and met with Dr. Larwance Rote [place] on 12/24/2018.  He felt the goals of treatment were palliative.  He felt her skin lesions were tethered to the chest wall on the left on right arm are not amenable to surgery.  He was unable to find a clinical trial that would fit her situation at this time.   He thought that radiation could be considered again with or without Xeloda depending on the disease course.  Strata testing was sent; note that this does not include PDL 1 testing.  He suggested switching to  toremifene as opposed to tamoxifen so that she may continue with Paxil without any concerns regarding metabolism. She is tolerating this well except for occasional hot flashes with diaphoresis primarily at night, which can wake her, but no other issues   Results for [name], Hays (MRN [id number]) as of 01/11/2019 13:26  Ref. Range 08/26/2018 08:28 09/24/2018 07:47 10/26/2018 08:20 11/16/2018 11:56 12/14/2018 08:46  CA 27.29 Latest Ref Range: 0.0 - 38.6 U/mL 71.6 (H) 86.0 (H) 130.9 (H) 138.9 (H) 203.1 (H)     REVIEW OF SYSTEMS: [name] notes some new places of concern to her. Her energy has improved from the past. She notes that she has been craving a lot of ice. [name] notes that she and her family have been practicing social distances. At home, she continues to clean her home and care for her grandchild. She also got out for a walk recently as well. The patient denies unusual headaches, visual changes, nausea, vomiting, or dizziness. There has been no unusual cough, phlegm production, or pleurisy. This been no change in bowel or bladder habits. The patient denies unexplained fatigue or unexplained weight loss, bleeding, rash, or fever. A detailed review of systems was otherwise noncontributory.    BREAST CANCER HISTORY: From the original intake note:  "[name]" had a fall while traveling living to [place] in their RV and injure her left upper arm and  her left breast . Approximately 3 weeks later she noted a mass in the left breast upper inner quadrant. It was somewhat tender. She brought this to medical attention and on 09/27/2016 underwent bilateral diagnostic mammography with tomography [facility] The breast density was  category B. The right breast was benign. In the left breast upper inner quadrant there was a large lobulated hyperdense mass which by exam was firm and nonmobile. By ultrasonography in the 11:30 o'clock radius 4 cm from the nipple there was a mass  extending to the subcutaneous tissues and measuring 4.9 cm. There was a small superficial fluid collection overlying this consistent with a hematoma. The left axilla was benign.  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  The patient's subsequent history is as detailed below   PAST MEDICAL HISTORY: Past Medical History:  Diagnosis Date   Anxiety    CAP (community acquired pneumonia)    Depression    Malignant neoplasm of upper-inner quadrant of left female breast ([place]) 10/01/2016   Personal history of chemotherapy    Tobacco use     PAST SURGICAL HISTORY: Past Surgical History:  Procedure Laterality Date   ABDOMINAL HYSTERECTOMY  2011   Laparoscopic   AUGMENTATION MAMMAPLASTY Bilateral 03/2017   BREAST LUMPECTOMY Left 04/05/2017   BREAST LUMPECTOMY WITH SENTINEL LYMPH NODE BIOPSY Left 04/07/2017   BREAST REDUCTION SURGERY Left 04/07/2017   CHOLECYSTECTOMY     MASTECTOMY Bilateral 08/25/2017   at [facility]   open reduction left ankle     SKIN GRAFT Left 06/19/2017   bil skin grafts to breasts    FAMILY HISTORY: Family History  Problem Relation Age of Onset   Heart disease Mother    Heart disease Father    Cancer Sister        ovarian ca   The patient's father died at the age of 73 from a myocardial infarction. The patient's mother died at the age of 29 with congestive heart failure. The patient had one brother, 2 sisters. One of her sisters was diagnosed with ovarian cancer at age 58 and died at age 27 from that disease   GYNECOLOGIC HISTORY:  No LMP recorded. Patient has had a hysterectomy. Menarche age 56, first live birth age 59. The patient is GX P1. She stopped  having periods approximately 2008. She did not take hormone replacement. She used oral contraceptives for approximately 30 years with no complications.   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 Her husband [name] is retired from [employer]. Their daughter [name], used to live in [place], but has moved back to [place]. [name]'s husband works in Engineer, technical sales for healthcare systems. The patient welcomed her 1st grandchild, a boy named Mcjunkin in May 2019.       ADVANCED DIRECTIVES: not in place   HEALTH MAINTENANCE: Social History   Tobacco Use   Smoking status: Former Smoker    Packs/day: 0.50    Years: 20.00    Pack years: 10.00    Last attempt to quit: 10/21/2016    Years since quitting: 2.2   Smokeless tobacco: Never Used  Substance Use Topics   Alcohol use: No   Drug use: No     Colonoscopy:  PAP:  Bone density:   No Known Allergies  Current Outpatient Medications  Medication Sig Dispense Refill   acetaminophen (TYLENOL) 325 MG tablet Take 650 mg by mouth every 6 (six) hours as needed for moderate pain.  ALPRAZolam (XANAX XR) 1 MG 24 hr tablet TABLET BY MOUTH EVERY DAY 30 tablet 1   azithromycin (ZITHROMAX) 250 MG tablet Take 2 tablets day one, then one tablet daily 6 each 0   busPIRone (BUSPAR) 30 MG tablet Take 1 tablet (30 mg total) by mouth 3 (three) times daily. 90 tablet 4   diphenhydrAMINE (BENADRYL) 25 MG tablet Take 50 mg by mouth as needed for sleep.      ibuprofen (ADVIL,MOTRIN) 200 MG tablet Take 600 mg by mouth every 6 (six) hours as needed for moderate pain.      magic mouthwash w/lidocaine SOLN Take 15 mLs by mouth 3 (three) times daily as needed for mouth pain. 240 mL 0   omeprazole (PRILOSEC) 40 MG capsule Take 1 capsule (40 mg total) by mouth daily. 60 capsule 2   ondansetron (ZOFRAN) 8 MG tablet Take 1 tablet (8 mg total) by mouth 2 (two) times daily as needed for  refractory nausea / vomiting. Start on day 3 after chemotherapy. 30 tablet 1   PARoxetine (PAXIL) 40 MG tablet TAKE 1 TABLET BY MOUTH EVERY DAY IN THE MORNING 90 tablet 3   prochlorperazine (COMPAZINE) 10 MG tablet Take 1 tablet (10 mg total) by mouth every 6 (six) hours as needed (Nausea or vomiting). 30 tablet 1   ranitidine (ZANTAC) 150 MG tablet Take 150 mg by mouth daily as needed for heartburn.      tamoxifen (NOLVADEX) 20 MG tablet Take 1 tablet (20 mg total) by mouth daily. 30 tablet 3   tiZANidine (ZANAFLEX) 4 MG tablet TAKE 1 TABLET BY MOUTH EVERYDAY AT BEDTIME 30 tablet 1   valACYclovir (VALTREX) 1000 MG tablet Take 1 tablet (1,000 mg total) by mouth daily. 90 tablet 2   No current facility-administered medications for this visit.    Facility-Administered Medications Ordered in Other Visits  Medication Dose Route Frequency Provider Last Rate Last Dose   sodium chloride flush (NS) 0.9 % injection 10 mL  10 mL Intracatheter Once Yukie Bergeron, Debra Dad, MD         OBJECTIVE: Middle-aged white woman in no acute distress  Vitals:   01/11/19 1335  BP: (!) 151/51  Pulse: (!) 103  Resp: 18  Temp: 98.6 F (37 C)  SpO2: 100%     Body mass index is 30.61 kg/m.   Filed Weights   01/11/19 1335  Weight: 207 lb 4.8 oz (94 kg)     ECOG FS:1 - Symptomatic but completely ambulatory   Sclerae unicteric, EOMs intact Oropharynx clear and moist Lungs no rales or rhonchi Heart regular rate and rhythm Abd soft, nontender, positive bowel sounds MSK no focal spinal tenderness, no upper extremity lymphedema Neuro: nonfocal, well oriented, appropriate affect Breasts: Status post bilateral mastectomy.  The chest wall recurrence on the left side, which has been irradiated, is perhaps minimally more advanced.  The one on the right, which has not received radiation treatment, is clearly worse.  Both sides are imaged below.  Photo 01/11/2019     Photo 10/19/2018     Chest Wall  09/24/2018 (the right side is up on this photo)     Chest wall 06/29/2018     LAB RESULTS:  CMP     Component Value Date/Time   NA 138 01/11/2019 1228   NA 140 10/24/2017 1307   K 4.1 01/11/2019 1228   K 5.4 (H) 10/24/2017 1307   CL 106 01/11/2019 1228   CO2 22 01/11/2019 1228   CO2  29 10/24/2017 1307   GLUCOSE 101 (H) 01/11/2019 1228   GLUCOSE 103 10/24/2017 1307   BUN 13 01/11/2019 1228   BUN 18.2 10/24/2017 1307   CREATININE 0.79 01/11/2019 1228   CREATININE 0.8 10/24/2017 1307   CALCIUM 9.0 01/11/2019 1228   CALCIUM 10.3 10/24/2017 1307   PROT 7.1 01/11/2019 1228   PROT 7.5 10/24/2017 1307   ALBUMIN 4.0 01/11/2019 1228   ALBUMIN 4.0 10/24/2017 1307   AST 22 01/11/2019 1228   AST 9 10/24/2017 1307   ALT 12 01/11/2019 1228   ALT 8 10/24/2017 1307   ALKPHOS 128 (H) 01/11/2019 1228   ALKPHOS 115 10/24/2017 1307   BILITOT 0.5 01/11/2019 1228   BILITOT 0.28 10/24/2017 1307   GFRNONAA >60 01/11/2019 1228   GFRAA >60 01/11/2019 1228    INo results found for: SPEP, UPEP  Lab Results  Component Value Date   WBC 8.2 01/11/2019   NEUTROABS 5.8 01/11/2019   HGB 11.1 (L) 01/11/2019   HCT 35.8 (L) 01/11/2019   MCV 94.0 01/11/2019   PLT 245 01/11/2019      Chemistry      Component Value Date/Time   NA 138 01/11/2019 1228   NA 140 10/24/2017 1307   K 4.1 01/11/2019 1228   K 5.4 (H) 10/24/2017 1307   CL 106 01/11/2019 1228   CO2 22 01/11/2019 1228   CO2 29 10/24/2017 1307   BUN 13 01/11/2019 1228   BUN 18.2 10/24/2017 1307   CREATININE 0.79 01/11/2019 1228   CREATININE 0.8 10/24/2017 1307      Component Value Date/Time   CALCIUM 9.0 01/11/2019 1228   CALCIUM 10.3 10/24/2017 1307   ALKPHOS 128 (H) 01/11/2019 1228   ALKPHOS 115 10/24/2017 1307   AST 22 01/11/2019 1228   AST 9 10/24/2017 1307   ALT 12 01/11/2019 1228   ALT 8 10/24/2017 1307   BILITOT 0.5 01/11/2019 1228   BILITOT 0.28 10/24/2017 1307       No results found for: LABCA2  No  components found for: LABCA125  No results for input(s): INR in the last 168 hours.  Urinalysis    Component Value Date/Time   COLORURINE STRAW (A) 10/30/2017 2050   APPEARANCEUR CLEAR 10/30/2017 2050   LABSPEC 1.006 10/30/2017 2050   [place] 6.0 10/30/2017 2050   GLUCOSEU NEGATIVE 10/30/2017 2050   [place] NEGATIVE 10/30/2017 2050   [place] NEGATIVE 10/30/2017 2050   [place] 10/30/2017 2050   PROTEINUR NEGATIVE 10/30/2017 2050   NITRITE NEGATIVE 10/30/2017 2050   LEUKOCYTESUR NEGATIVE 10/30/2017 2050     STUDIES: No results found.  ELIGIBLE FOR AVAILABLE RESEARCH PROTOCOL: no   ASSESSMENT: 63 y.o. [employer], [place] woman status post left breast upper inner quadrant biopsy 09/30/2016 for a clinical T2 N0, stage II a invasive ductal carcinoma, grade 3, estrogen receptor positive, progesterone receptor negative, HER-2 not amplified, with an MIB-1 of 80%.  (1) genetics testing not actionable (see below).  (2) neoadjuvant chemotherapy given 10/30/2016 to 03/12/2017 consisting of cyclophosphamide and doxorubicin in dose dense fashion 4  followed by paclitaxel 1, then Abraxane (x8?)  further treatments discontinued because of neuropathy.  (3) status post left lumpectomy with oncoplasty and right reduction mammoplasty showing a residual T2 N1 invasive ductal carcinoma, with lymphovascular invasion, but negative margins; the tumor is now HER-2 positive.  (a) postop PET scan showed no evidence of metastatic disease.  (4) adjuvant trastuzumab/pertuzumab started 06/20/2017 (received at [facility]), started here on 07/11/2017--continued through April 2019 when  patient left for [place]  (a) Echo on 06/18/2017 at [place]: LVEF 55-60%  (b) echocardiogram 09/26/2017 shows an ejection fraction in the 60-65% range.  (c) echocardiogram 12/26/2017 shows an ejection fraction in the 55-60% range  (d) see below for subsequent echocardiogram results   (5) adjuvant radiation was to follow, but  see (7) below  (6) anti-estrogens: Letrozole daily started 03/26/2017  (a) DEXA on 04/03/2017 demonstrated a T score of -1.3 in the right femoral neck  (b) Zometa given at [facility] on 06/20/2017  RECURRENCE: (7) Patient developed a left breast nodule and underwent mammogram and ultrasound on 08/06/17 showing three areas of concern at 10 o'clock, 1130 o'clock, and at 8 o'clock. Biopsy on 08/07/2017 demonstrated at 11:30 fat necrosis, however at 10:00 it was positive for IDC, grade 3, ER+(50%), PR-(0%), Ki-67 90%, HER-2 negative (ratio 1.48).  (a) staging chest CT 08/15/2017 shows 0.5 cm left lower lobe lung nodule of uncertain significance  (8) status post left modified radical mastectomy and right simple mastectomy 08/25/2017 showing  (a) right breast, no evidence of malignancy  (b) left breast, ypT2 ypN1 invasive ductal carcinoma, grade 3, with negative margins; repeat prognostic panel: Triple negative  (9) started adjuvant chemotherapy with cyclophosphamide/methotrexate/fluorouracil [CMF] 11/10/2016, eight cycles planned  (a) continuing trastuzumab/pertuzumab   (b) CMF chemotherapy discontinued after 4 cycles with disease progression  (10) status post left chest wall biopsy for further local recurrence 01/05/2018, the tumor being now weakly estrogen receptor positive, progesterone receptor negative, and again HER-2 negative  (11) PET scan 03/25/2018 negative except for a 1.2 cm left internal mammary lymph node, SUV 6.9  (12) adjuvant radiation completed 03/19/2018 (30 doses to left chest wall area under Dr Modesto Charon at [facility]  (a) did not receive capecitabine sensitization  (13)  Letrozole resumed 03/27/2018--discontinued 06/29/2018  (14) foundation one 01/09/2018 shows no actionable mutations, with stable microsatellite status and low mutational burden.  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 (a) PD-L1 testing on immune cells, 0% staining (negative)--obtained on 08/07/2017 cycle,  which was weakly ER positive, progesterone and HER-2 negative (b) genetics testing through [facility] October 31, 2016 found no deleterious mutations in ATM, BRCA1, BRCA2, BRI P1, CDH 1, CH EK 2, EPCAM, MLH1, MSH2, MSH6, NBN, NF1, PALB 2, PMS2, PTEN, RAD 51C, RAD 51D, STK 1 1, or TP 53.  (15) trastuzumab and pertuzumab resumed 03/31/2018 (a) echocardiogram on 04/20/2018 showed an ejection fraction in the 50-55% range.  (b) changed to every 4-week treatments beginning 07/01/2018 to coordinate with fulvestrant (c) echocardiogram 08/17/2018 shows a well-preserved ejection fraction. (d) pertuzumab discontinued as of December 2019  (16) fine-needle aspiration of a chest wall lesion on 06/04/2018 was read as "non-small cell carcinoma".  Punch biopsy obtained at the visit 06/08/2018 for prognostic panel determination showed poorly differentiated carcinoma, estrogen receptor positive at 60%, progesterone receptor negative, HER-2 negative by immunohistochemistry at 1+  (17) fulvestrant started 07/01/2018, repeat every 28 days  (a) mid September started palbociclib 125 mg daily, 21/7,   (b) discontinued with evidence of mixed response  (18) carboplatin/gemcitabine days 1 and 8 started 09/29/2018, repeated every 21 days (a) baseline PET scan 09/23/2018 shows progression on prior treatment, measurable disease (b) CA-27-29 is now informative   (c) cycle 2 of carboplatin/gemcitabine delayed 1 week  (d) carboplatin/gemcitabine discontinued after 3 cycles with evidence of disease progression  (19) PET scan 12/10/2018 shows progression in chest wall and sternum, stable disease in nodes and other blytic bone lesions  (20) Fareston/toremifene  started 12/28/2018  (21) palliative radiation to the right chest wall pending  (a) consider capecitabine radiosensitization   PLAN:  [name] is feeling considerably better off the palbociclib.  Note only on her counts improved but she has more energy and her functional  status is clearly improved.  It is worth remembering that as we continue to try to work out what is the best next treatment for her.  Right now she is tolerating toremifene/Fareston with minimal hot flashes.  We could consider adding a different CDK inhibitor, such as ribociclib, but right now we are considering possibly adding capecitabine to adjuvant radiation and that would certainly complicate matters too much.  I am referring her back to Dr. Sondra Come, who saw her originally for radiation to the left chest wall, which she ended up receiving in [place].  I think she would benefit from radiation to the contralateral side at this point and if the feels capecitabine at 1000 mg twice daily on radiation days would be helpful I will be glad to write that for her  We are continuing the trastuzumab, now every 28 days.  Before she receives the next dose, 4 weeks from today, she will have an echocardiogram.  At the next visit we will make a decision regarding whether or not to add the ribociclib and set her up for restaging studies.  If after 3 months on the toremifene there has been no evidence of response we will consider switching to exemestane/everolimus  She has a good understanding of this plan.  She is hoping the strata study at [facility] may also reveal some treatment possibilities.  She knows to call for any other issues that may develop before the next visit.     Helen Cuff, Debra Dad, MD  01/11/19 2:20 PM Medical Oncology and Hematology [facility] [address] [place], [place] [address] Tel. [phone]    Fax. [phone]  I, Jacqualyn Posey am acting as a Education administrator for Chauncey Cruel, MD.   I, Lurline Del MD, have reviewed the above documentation for accuracy and completeness, and I agree with the above.

## 2019-01-11 ENCOUNTER — Other Ambulatory Visit: Payer: Self-pay

## 2019-01-11 ENCOUNTER — Inpatient Hospital Stay: Payer: BLUE CROSS/BLUE SHIELD

## 2019-01-11 ENCOUNTER — Inpatient Hospital Stay: Payer: BLUE CROSS/BLUE SHIELD | Attending: Oncology

## 2019-01-11 ENCOUNTER — Inpatient Hospital Stay (HOSPITAL_BASED_OUTPATIENT_CLINIC_OR_DEPARTMENT_OTHER): Payer: BLUE CROSS/BLUE SHIELD | Admitting: Oncology

## 2019-01-11 VITALS — BP 151/51 | HR 103 | Temp 98.6°F | Resp 18 | Ht 69.0 in | Wt 207.3 lb

## 2019-01-11 VITALS — HR 88

## 2019-01-11 DIAGNOSIS — Z170 Estrogen receptor positive status [ER+]: Principal | ICD-10-CM

## 2019-01-11 DIAGNOSIS — C7989 Secondary malignant neoplasm of other specified sites: Secondary | ICD-10-CM

## 2019-01-11 DIAGNOSIS — T451X5A Adverse effect of antineoplastic and immunosuppressive drugs, initial encounter: Secondary | ICD-10-CM

## 2019-01-11 DIAGNOSIS — C50212 Malignant neoplasm of upper-inner quadrant of left female breast: Secondary | ICD-10-CM

## 2019-01-11 DIAGNOSIS — C50912 Malignant neoplasm of unspecified site of left female breast: Secondary | ICD-10-CM

## 2019-01-11 DIAGNOSIS — C7951 Secondary malignant neoplasm of bone: Secondary | ICD-10-CM | POA: Insufficient documentation

## 2019-01-11 DIAGNOSIS — D701 Agranulocytosis secondary to cancer chemotherapy: Secondary | ICD-10-CM

## 2019-01-11 DIAGNOSIS — Z5112 Encounter for antineoplastic immunotherapy: Secondary | ICD-10-CM | POA: Insufficient documentation

## 2019-01-11 DIAGNOSIS — G620 Drug-induced polyneuropathy: Secondary | ICD-10-CM

## 2019-01-11 DIAGNOSIS — Z95828 Presence of other vascular implants and grafts: Secondary | ICD-10-CM

## 2019-01-11 LAB — CMP (CANCER CENTER ONLY)
ALT: 12 U/L (ref 0–44)
AST: 22 U/L (ref 15–41)
Albumin: 4 g/dL (ref 3.5–5.0)
Alkaline Phosphatase: 128 U/L — ABNORMAL HIGH (ref 38–126)
Anion gap: 10 (ref 5–15)
BUN: 13 mg/dL (ref 8–23)
CO2: 22 mmol/L (ref 22–32)
CREATININE: 0.79 mg/dL (ref 0.44–1.00)
Calcium: 9 mg/dL (ref 8.9–10.3)
Chloride: 106 mmol/L (ref 98–111)
GFR, Est AFR Am: >60 mL/min (ref >60)
Glucose, Bld: 101 mg/dL — ABNORMAL HIGH (ref 70–99)
Potassium: 4.1 mmol/L (ref 3.5–5.1)
Sodium: 138 mmol/L (ref 135–145)
Total Bilirubin: 0.5 mg/dL (ref 0.3–1.2)
Total Protein: 7.1 g/dL (ref 6.5–8.1)

## 2019-01-11 LAB — CBC WITH DIFFERENTIAL/PLATELET
Abs Immature Granulocytes: 0.02 10*3/uL (ref 0.00–0.07)
Basophils Absolute: 0.1 10*3/uL (ref 0.0–0.1)
Basophils Relative: 1 %
Eosinophils Absolute: 0.4 10*3/uL (ref 0.0–0.5)
Eosinophils Relative: 5 %
HCT: 35.8 % — ABNORMAL LOW (ref 36.0–46.0)
Hemoglobin: 11.1 g/dL — ABNORMAL LOW (ref 12.0–15.0)
Immature Granulocytes: 0 %
Lymphocytes Relative: 16 %
Lymphs Abs: 1.3 10*3/uL (ref 0.7–4.0)
MCH: 29.1 pg (ref 26.0–34.0)
MCHC: 31 g/dL (ref 30.0–36.0)
MCV: 94 fL (ref 80.0–100.0)
MONOS PCT: 7 %
Monocytes Absolute: 0.6 10*3/uL (ref 0.1–1.0)
NEUTROS PCT: 71 %
Neutro Abs: 5.8 10*3/uL (ref 1.7–7.7)
Platelets: 245 10*3/uL (ref 150–400)
RBC: 3.81 MIL/uL — ABNORMAL LOW (ref 3.87–5.11)
RDW: 15.7 % — ABNORMAL HIGH (ref 11.5–15.5)
WBC: 8.2 10*3/uL (ref 4.0–10.5)
nRBC: 0 % (ref 0.0–0.2)

## 2019-01-11 MED ORDER — SODIUM CHLORIDE 0.9 % IV SOLN
Freq: Once | INTRAVENOUS | Status: AC
  Administered 2019-01-11: 15:00:00 via INTRAVENOUS
  Filled 2019-01-11: qty 250

## 2019-01-11 MED ORDER — TRASTUZUMAB CHEMO 150 MG IV SOLR
6.0000 mg/kg | Freq: Once | INTRAVENOUS | Status: AC
  Administered 2019-01-11: 567 mg via INTRAVENOUS
  Filled 2019-01-11: qty 27

## 2019-01-11 MED ORDER — DIPHENHYDRAMINE HCL 25 MG PO CAPS
25.0000 mg | ORAL_CAPSULE | Freq: Once | ORAL | Status: AC
  Administered 2019-01-11: 25 mg via ORAL

## 2019-01-11 MED ORDER — ACETAMINOPHEN 325 MG PO TABS
ORAL_TABLET | ORAL | Status: AC
  Filled 2019-01-11: qty 2

## 2019-01-11 MED ORDER — HEPARIN SOD (PORK) LOCK FLUSH 100 UNIT/ML IV SOLN
500.0000 [IU] | Freq: Once | INTRAVENOUS | Status: AC | PRN
  Administered 2019-01-11: 500 [IU]
  Filled 2019-01-11: qty 5

## 2019-01-11 MED ORDER — SODIUM CHLORIDE 0.9% FLUSH
10.0000 mL | INTRAVENOUS | Status: DC | PRN
  Administered 2019-01-11: 10 mL
  Filled 2019-01-11: qty 10

## 2019-01-11 MED ORDER — DIPHENHYDRAMINE HCL 25 MG PO CAPS
ORAL_CAPSULE | ORAL | Status: AC
  Filled 2019-01-11: qty 1

## 2019-01-11 MED ORDER — ACETAMINOPHEN 325 MG PO TABS
650.0000 mg | ORAL_TABLET | Freq: Once | ORAL | Status: AC
  Administered 2019-01-11: 650 mg via ORAL

## 2019-01-11 NOTE — Patient Instructions (Signed)
[facility] Discharge Instructions for Patients Receiving Chemotherapy  Today you received the following chemotherapy agents Herceptin  To help prevent nausea and vomiting after your treatment, we encourage you to take your nausea medication as directed   If you develop nausea and vomiting that is not controlled by your nausea medication, call the clinic.   BELOW ARE SYMPTOMS THAT SHOULD BE REPORTED IMMEDIATELY:  *FEVER GREATER THAN 100.5 F  *CHILLS WITH OR WITHOUT FEVER  NAUSEA AND VOMITING THAT IS NOT CONTROLLED WITH YOUR NAUSEA MEDICATION  *UNUSUAL SHORTNESS OF BREATH  *UNUSUAL BRUISING OR BLEEDING  TENDERNESS IN MOUTH AND THROAT WITH OR WITHOUT PRESENCE OF ULCERS  *URINARY PROBLEMS  *BOWEL PROBLEMS  UNUSUAL RASH Items with * indicate a potential emergency and should be followed up as soon as possible.  Feel free to call the clinic should you have any questions or concerns. The clinic phone number is [phone].  Please show the CHEMO ALERT CARD at check-in to the Emergency Department and triage nurse.   

## 2019-01-12 ENCOUNTER — Other Ambulatory Visit: Payer: Self-pay | Admitting: Oncology

## 2019-01-12 LAB — CANCER ANTIGEN 27.29: CA 27.29: 234.1 U/mL — ABNORMAL HIGH (ref 0.0–38.6)

## 2019-01-20 NOTE — Progress Notes (Signed)
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 Approximately 3 weeks later she noted a mass in the left breast upper inner quadrant. It was somewhat tender. She brought this to medical attention and on 09/27/2016 underwent bilateral diagnostic mammography with tomography [facility]. The breast density was category B. The right breast was benign. In the left breast upper inner quadrant there was a large lobulated hyperdense mass which by exam was firm and nonmobile. By ultrasonography in the 11:30 o'clock radius 4 cm from the nipple there was a mass extending to the subcutaneous tissues and measuring 4.9 cm. There was a small superficial fluid collection overlying this consistent with a hematoma. The left axilla was benign.  Biopsy of the left breast mass in question 09/30/2016 showed (SAA 17-02/04/2003) and invasive ductal carcinoma grade 3, estrogen receptor 80% positive with moderate staining intensity, progesterone receptor negative, MIB-1 of 80%, and no HER-2 amplification, the signals ratio being 1.49 and the number per cell 2.60  Past/Anticipated interventions by surgeon, if any:  status post left lumpectomy with oncoplasty and right reduction mammoplasty showing a residual T2 N1 invasive ductal carcinoma, with lymphovascular invasion, but negative margins; the tumor is now HER-2 positive.   status post left modified radical mastectomy and right simple mastectomy 08/25/2017 showing             (a) right breast, no evidence of malignancy             (b) left breast, ypT2 ypN1 invasive ductal carcinoma, grade 3, with negative margins; repeat prognostic  panel: Triple negative  Past/Anticipated interventions by medical oncology, if any: Chemotherapy Per Dr. Jana Hakim 01/11/19:  PLAN:  [name] is feeling considerably better off the palbociclib.  Note only on her counts improved but she has more energy and her functional status is clearly improved.  It is worth remembering that as we continue to try to work out what is the best next treatment for her.  Right now she is tolerating toremifene/Fareston with minimal hot flashes.  We could consider adding a different CDK inhibitor, such as ribociclib, but right now we are considering possibly adding capecitabine to adjuvant radiation and that would certainly complicate matters too much.  I am referring her back to Dr. Sondra Come, who saw her originally for radiation to the left chest wall, which she ended up receiving in [place].  I think she would benefit from radiation to the contralateral side at this point and if the feels capecitabine at 1000 mg twice daily on radiation days would be helpful I will be glad to write that for her  We are continuing the trastuzumab, now every 28 days.  Before she receives the next dose, 4 weeks from today, she will have an echocardiogram.  At the next visit we will make a decision regarding whether or not to add the ribociclib and set her up for restaging studies.  If after 3 months on the toremifene there has been no evidence of response we will consider switching to exemestane/everolimus  She has a good understanding of this plan.  She is hoping the strata study at [facility] may also reveal some treatment possibilities.  She knows  to call for any other issues that may develop before the next visit.   Lymphedema issues, if any: Pt reports some lymphedema in LEFT arm.   Pain issues, if any:  Pt denies c/o pain.  SAFETY ISSUES: Prior radiation? adjuvant radiation completed 03/19/2018 (30 doses to left chest wall area under Dr Modesto Charon at [facility]               (a) did not receive capecitabine sensitization  Pacemaker/ICD? No  Possible current pregnancy? No  Is the patient on methotrexate? No  Current Complaints / other details:  Pt presents today for reconsult with Dr. Sondra Come for Radiation Oncology. Pt is unaccompanied.    I am referring her back to Dr. Sondra Come, who saw her originally for radiation to the left chest wall, which she ended up receiving in [place].  I think she would benefit from radiation to the contralateral side at this point and if the feels capecitabine at 1000 mg twice daily on radiation days would be helpful I will be glad to write that for her  BP (!) 157/77 (BP Location: Right Arm, Patient Position: Sitting)   Pulse 93   Temp 98.9 F (37.2 C) (Oral)   Resp 18   Ht _0  (1.753 m)   Wt 207 lb 6 oz (94.1 kg)   SpO2 98%   BMI 30.62 kg/m   Wt Readings from Last 3 Encounters:  01/21/19 207 lb 6 oz (94.1 kg)  01/11/19 207 lb 4.8 oz (94 kg)  12/11/18 208 lb 4.8 oz (94.5 kg)       Loma Sousa, RN 01/21/2019,2:44 PM

## 2019-01-21 ENCOUNTER — Other Ambulatory Visit: Payer: Self-pay

## 2019-01-21 ENCOUNTER — Ambulatory Visit
Admission: RE | Admit: 2019-01-21 | Discharge: 2019-01-21 | Disposition: A | Discharge status: Home / Self Care | Payer: BLUE CROSS/BLUE SHIELD | Source: Ambulatory Visit | Attending: Radiation Oncology | Admitting: Radiation Oncology

## 2019-01-21 ENCOUNTER — Encounter: Payer: Self-pay | Admitting: Radiation Oncology

## 2019-01-21 VITALS — BP 157/77 | HR 93 | Temp 98.9°F | Resp 18 | Ht 69.0 in | Wt 207.4 lb

## 2019-01-21 DIAGNOSIS — C50212 Malignant neoplasm of upper-inner quadrant of left female breast: Secondary | ICD-10-CM | POA: Insufficient documentation

## 2019-01-21 DIAGNOSIS — Z923 Personal history of irradiation: Secondary | ICD-10-CM | POA: Insufficient documentation

## 2019-01-21 DIAGNOSIS — C7989 Secondary malignant neoplasm of other specified sites: Secondary | ICD-10-CM | POA: Insufficient documentation

## 2019-01-21 DIAGNOSIS — Z171 Estrogen receptor negative status [ER-]: Secondary | ICD-10-CM | POA: Diagnosis not present

## 2019-01-21 DIAGNOSIS — Z170 Estrogen receptor positive status [ER+]: Secondary | ICD-10-CM | POA: Diagnosis not present

## 2019-01-21 DIAGNOSIS — Z9049 Acquired absence of other specified parts of digestive tract: Secondary | ICD-10-CM | POA: Insufficient documentation

## 2019-01-21 DIAGNOSIS — Z87891 Personal history of nicotine dependence: Secondary | ICD-10-CM | POA: Diagnosis not present

## 2019-01-21 DIAGNOSIS — Z79899 Other long term (current) drug therapy: Secondary | ICD-10-CM | POA: Insufficient documentation

## 2019-01-21 DIAGNOSIS — Z9013 Acquired absence of bilateral breasts and nipples: Secondary | ICD-10-CM | POA: Diagnosis not present

## 2019-01-21 DIAGNOSIS — F419 Anxiety disorder, unspecified: Secondary | ICD-10-CM | POA: Diagnosis not present

## 2019-01-21 DIAGNOSIS — F329 Major depressive disorder, single episode, unspecified: Secondary | ICD-10-CM | POA: Insufficient documentation

## 2019-01-21 DIAGNOSIS — Z809 Family history of malignant neoplasm, unspecified: Secondary | ICD-10-CM | POA: Diagnosis not present

## 2019-01-21 NOTE — Patient Instructions (Signed)
Coronavirus (COVID-19) Are you at risk?  Are you at risk for the Coronavirus (COVID-19)?  To be considered HIGH RISK for Coronavirus (COVID-19), you have to meet the following criteria:  . Traveled to [place], [place], [place], [place] or [place]; or in the [place] to [place], [place], [place], or [place]; and have fever, cough, and shortness of breath within the last 2 weeks of travel OR . Been in close contact with a person diagnosed with COVID-19 within the last 2 weeks and have fever, cough, and shortness of breath . IF YOU DO NOT MEET THESE CRITERIA, YOU ARE CONSIDERED LOW RISK FOR COVID-19.  What to do if you are HIGH RISK for COVID-19?  . If you are having a medical emergency, call 911. . Seek medical care right away. Before you go to a doctor's office, urgent care or emergency department, call ahead and tell them about your recent travel, contact with someone diagnosed with COVID-19, and your symptoms. You should receive instructions from your physician's office regarding next steps of care.  . When you arrive at healthcare provider, tell the healthcare staff immediately you have returned from visiting [place], [place], [place], [place] or [place]; or traveled in the [place] to [place], [place], [place], or [place]; in the last two weeks or you have been in close contact with a person diagnosed with COVID-19 in the last 2 weeks.   . Tell the health care staff about your symptoms: fever, cough and shortness of breath. . After you have been seen by a medical provider, you will be either: o Tested for (COVID-19) and discharged home on quarantine except to seek medical care if symptoms worsen, and asked to  - Stay home and avoid contact with others until you get your results (4-5 days)  - Avoid travel on public transportation if possible (such as bus, train, or airplane) or o Sent to the Emergency Department by EMS for evaluation, COVID-19 testing, and possible  admission depending on your condition and test results.  What to do if you are LOW RISK for COVID-19?  Reduce your risk of any infection by using the same precautions used for avoiding the common cold or flu:  . Wash your hands often with soap and warm water for at least 20 seconds.  If soap and water are not readily available, use an alcohol-based hand sanitizer with at least 60% alcohol.  . If coughing or sneezing, cover your mouth and nose by coughing or sneezing into the elbow areas of your shirt or coat, into a tissue or into your sleeve (not your hands). . Avoid shaking hands with others and consider head nods or verbal greetings only. . Avoid touching your eyes, nose, or mouth with unwashed hands.  . Avoid close contact with people who are sick. . Avoid places or events with large numbers of people in one location, like concerts or sporting events. . Carefully consider travel plans you have or are making. . If you are planning any travel outside or inside the [place], visit the CDC's Travelers' Health webpage for the latest health notices. . If you have some symptoms but not all symptoms, continue to monitor at home and seek medical attention if your symptoms worsen. . If you are having a medical emergency, call 911.   ADDITIONAL HEALTHCARE OPTIONS FOR PATIENTS  [facility] / e-Visit: [email or web address]         [facility]: [phone]  [facility]: [phone]                   [facility]: [phone]   

## 2019-01-21 NOTE — Progress Notes (Signed)
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  Estrogen receptor positive, HER-2 positive breast cancer; triple negative breast cancers  HISTORY OF PRESENT ILLNESS::[name] is a 63 y.o. female with the following oncologic history:   Malignant neoplasm of upper-inner quadrant of left breast in female, estrogen receptor positive ([employer])   10/01/2016 Initial Diagnosis    Malignant neoplasm of upper-inner quadrant of left breast in female, estrogen receptor positive ([place])    06/30/2018 - 06/30/2018 Chemotherapy    The patient had ado-trastuzumab emtansine (KADCYLA) 340 mg in sodium chloride 0.9 % 250 mL chemo infusion, 3.6 mg/kg = 340 mg, Intravenous, Once, 0 of 5 cycles  for chemotherapy treatment.     07/02/2018 -  Chemotherapy    The patient had trastuzumab (HERCEPTIN) 567 mg in sodium chloride 0.9 % 250 mL chemo infusion, 6 mg/kg = 756 mg, Intravenous,  Once, 8 of 11 cycles Dose modification: 6 mg/kg (original dose 8 mg/kg, Cycle 3, Reason: Provider Judgment) Administration: 567 mg (07/02/2018), 567 mg (07/29/2018), 567 mg (08/26/2018), 567 mg (12/14/2018), 567 mg (11/23/2018), 567 mg (10/26/2018), 567 mg (09/24/2018), 567 mg (01/11/2019) pertuzumab (PERJETA) 420 mg in sodium chloride 0.9 % 250 mL chemo infusion, 840 mg, Intravenous, Once, 3 of 3 cycles Dose modification: 420 mg (original dose 840 mg, Cycle 3, Reason: Provider Judgment) Administration: 420 mg (07/02/2018), 420 mg (07/29/2018), 420 mg (08/26/2018)  for chemotherapy treatment.     09/29/2018 - 12/06/2018 Chemotherapy    The patient had palonosetron (ALOXI) injection 0.25 mg, 0.25 mg, Intravenous,  Once, 3 of 4 cycles  Administration: 0.25 mg (09/29/2018), 0.25 mg (10/05/2018), 0.25 mg (10/26/2018), 0.25 mg (11/02/2018), 0.25 mg (11/16/2018), 0.25 mg (11/23/2018) pegfilgrastim-cbqv (UDENYCA) injection 6 mg, 6 mg, Subcutaneous, Once, 3 of 4 cycles Administration: 6 mg (10/07/2018), 6 mg (11/04/2018), 6 mg (11/25/2018) CARBOplatin (PARAPLATIN) 270 mg in sodium chloride 0.9 % 250 mL chemo infusion, 270 mg (100 % of original dose 268.4 mg), Intravenous,  Once, 3 of 4 cycles Dose modification:   (original dose 268.4 mg, Cycle 1) Administration: 270 mg (09/29/2018), 270 mg (10/05/2018), 250 mg (10/26/2018), 270 mg (11/02/2018), 270 mg (11/16/2018), 270 mg (11/23/2018) gemcitabine (GEMZAR) 2,000 mg in sodium chloride 0.9 % 250 mL chemo infusion, 2,128 mg, Intravenous,  Once, 3 of 4 cycles Administration: 2,000 mg (09/29/2018), 2,000 mg (10/05/2018), 2,000 mg (10/26/2018), 2,000 mg (11/02/2018), 2,000 mg (11/16/2018), 2,000 mg (11/23/2018)  for chemotherapy treatment.      Recurrent breast cancer, left ([place])   08/12/2017 Initial Diagnosis    Recurrent breast cancer, left ([place])    06/30/2018 - 06/30/2018 Chemotherapy    The patient had ado-trastuzumab emtansine (KADCYLA) 340 mg in sodium chloride 0.9 % 250 mL chemo infusion, 3.6 mg/kg = 340 mg, Intravenous, Once, 0 of 5 cycles  for chemotherapy treatment.     07/02/2018 -  Chemotherapy    The patient had trastuzumab (HERCEPTIN) 567 mg in sodium chloride 0.9 % 250 mL chemo infusion, 6 mg/kg = 756 mg, Intravenous,  Once, 8 of 11 cycles Dose modification: 6 mg/kg (original dose 8 mg/kg, Cycle 3, Reason: Provider Judgment) Administration: 567 mg (07/02/2018), 567 mg (07/29/2018), 567 mg (08/26/2018), 567 mg (12/14/2018), 567 mg (  11/23/2018), 567 mg (10/26/2018), 567 mg (09/24/2018), 567 mg (01/11/2019) pertuzumab (PERJETA) 420 mg in sodium chloride 0.9 % 250 mL chemo infusion, 840 mg, Intravenous, Once, 3 of 3 cycles Dose modification: 420 mg (original dose 840 mg, Cycle 3, Reason: Provider Judgment)  Administration: 420 mg (07/02/2018), 420 mg (07/29/2018), 420 mg (08/26/2018)  for chemotherapy treatment.     09/29/2018 - 12/06/2018 Chemotherapy    The patient had palonosetron (ALOXI) injection 0.25 mg, 0.25 mg, Intravenous,  Once, 3 of 4 cycles Administration: 0.25 mg (09/29/2018), 0.25 mg (10/05/2018), 0.25 mg (10/26/2018), 0.25 mg (11/02/2018), 0.25 mg (11/16/2018), 0.25 mg (11/23/2018) pegfilgrastim-cbqv (UDENYCA) injection 6 mg, 6 mg, Subcutaneous, Once, 3 of 4 cycles Administration: 6 mg (10/07/2018), 6 mg (11/04/2018), 6 mg (11/25/2018) CARBOplatin (PARAPLATIN) 270 mg in sodium chloride 0.9 % 250 mL chemo infusion, 270 mg (100 % of original dose 268.4 mg), Intravenous,  Once, 3 of 4 cycles Dose modification:   (original dose 268.4 mg, Cycle 1) Administration: 270 mg (09/29/2018), 270 mg (10/05/2018), 250 mg (10/26/2018), 270 mg (11/02/2018), 270 mg (11/16/2018), 270 mg (11/23/2018) gemcitabine (GEMZAR) 2,000 mg in sodium chloride 0.9 % 250 mL chemo infusion, 2,128 mg, Intravenous,  Once, 3 of 4 cycles Administration: 2,000 mg (09/29/2018), 2,000 mg (10/05/2018), 2,000 mg (10/26/2018), 2,000 mg (11/02/2018), 2,000 mg (11/16/2018), 2,000 mg (11/23/2018)  for chemotherapy treatment.     The patient returns today for re-evaluation of her triple negative breast cancer. She was last seen in our clinic in February 2019. At that time, she was planned to receive radiation therapy to the left chest wall. 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 She underwent excision of the left chest mass on 01/08/2018.  She went on to receive adjuvant radiation therapy to the left chest wall, completed 03/19/2018 at [facility] under the care of Dr. Modesto Charon. Her PET scan in June 2019 was negative except for  a 1.2 cm left internal mammary lymph node (SUV 6.9). Her chemotherapy and anti-estrogen therapy were resumed in June. Summary of chemotherapy detailed above. Letrozole was discontinued in September.  Her most recent PET scan on 12/10/2018 showed new hypermetabolic subcutaneous nodules in the medial right chest wall superficial to the musculature, the largest measuring 14 mm. Stable hypermetabolic mediastinal and hilar nodal metastases; no significant improvement or worsening. Stable hypermetabolic left internal mammary lymph node. Post radiation change in the left upper lobe. Sclerotic hypermetabolic sternal metastasis slightly increased in size. No clear evidence of skeletal metastasis elsewhere.   She continues on trastuzumab and zolendronate, with her most recent dose received on 12/14/2018. She tolerates this well and without any noticeable side effects.   She sought a second opinion at [facility] on 12/24/2018. They felt the goals of treatment were palliative. They felt her skin lesions were tethered to the chest wall  Right and` not amenable to surgery. They were unable to find a clinical trial that would fit her situation at this time. They thought that radiation could be considered again with or without Xeloda depending on the disease course. They suggested switching to toremifene as opposed to tamoxifen so that she may continue with Paxil without any concerns regarding metabolism. She is tolerating this well except for occasional hot flashes with diaphoresis primarily at night, which can wake her, but no other issues.  The patient has kindly been back referred today for discussion of radiation treatment to the right chest wall.   On review of systems, the patient reports lymphedema in her left arm.   PREVIOUS RADIATION THERAPY: Yes - Adjuvant radiation completed 03/19/2018 (30 doses to left chest wall area under Dr. Modesto Charon at [facility]), patient did not receive capecitabine  sensitization  PAST MEDICAL HISTORY:  has a past medical history of Anxiety, CAP (community acquired pneumonia), Depression, Malignant neoplasm of upper-inner quadrant of left female breast ([place]) (10/01/2016), Personal history of chemotherapy, and Tobacco use.    PAST SURGICAL HISTORY: Past Surgical History:  Procedure Laterality Date  . ABDOMINAL HYSTERECTOMY  2011   Laparoscopic  . AUGMENTATION MAMMAPLASTY Bilateral 03/2017  . BREAST LUMPECTOMY Left 04/05/2017  . BREAST LUMPECTOMY WITH SENTINEL LYMPH NODE BIOPSY Left 04/07/2017  . BREAST REDUCTION SURGERY Left 04/07/2017  . CHOLECYSTECTOMY    . MASTECTOMY Bilateral 08/25/2017   at [facility]  . open reduction left ankle    . SKIN GRAFT Left 06/19/2017   bil skin grafts to breasts    FAMILY HISTORY: family history includes Cancer in her sister; Heart disease in her father and mother.  SOCIAL HISTORY:  reports that she quit smoking about 2 years ago. She has a 10.00 pack-year smoking history. She has never used smokeless tobacco. She reports that she does not drink alcohol or use drugs.  ALLERGIES: Propofol  MEDICATIONS:  Current Outpatient Medications  Medication Sig Dispense Refill  . acetaminophen (TYLENOL) 325 MG tablet Take 650 mg by mouth every 6 (six) hours as needed for moderate pain.     Marland Kitchen ALPRAZolam (XANAX XR) 1 MG 24 hr tablet TABLET BY MOUTH EVERY DAY 30 tablet 1  . busPIRone (BUSPAR) 30 MG tablet Take 1 tablet (30 mg total) by mouth 3 (three) times daily. 90 tablet 4  . diphenhydrAMINE (BENADRYL) 25 MG tablet Take 50 mg by mouth as needed for sleep.     Marland Kitchen ibuprofen (ADVIL,MOTRIN) 200 MG tablet Take 600 mg by mouth every 6 (six) hours as needed for moderate pain.     Marland Kitchen omeprazole (PRILOSEC) 40 MG capsule Take 1 capsule (40 mg total) by mouth daily. 60 capsule 2  . ondansetron (ZOFRAN) 8 MG tablet Take 1 tablet (8 mg total) by mouth 2 (two) times daily as needed for refractory nausea / vomiting. Start on day 3  after chemotherapy. 30 tablet 1  . PARoxetine (PAXIL) 40 MG tablet TAKE 1 TABLET BY MOUTH EVERY DAY IN THE MORNING 90 tablet 3  . prochlorperazine (COMPAZINE) 10 MG tablet Take 1 tablet (10 mg total) by mouth every 6 (six) hours as needed (Nausea or vomiting). 30 tablet 1  . ranitidine (ZANTAC) 150 MG tablet Take 150 mg by mouth daily as needed for heartburn.     Marland Kitchen tiZANidine (ZANAFLEX) 4 MG tablet TAKE 1 TABLET BY MOUTH EVERYDAY AT BEDTIME 30 tablet 1  . Toremifene Citrate (FARESTON) 60 MG tablet Take by mouth.    . valACYclovir (VALTREX) 1000 MG tablet Take 1 tablet (1,000 mg total) by mouth daily. 90 tablet 2  . azithromycin (ZITHROMAX) 250 MG tablet Take 2 tablets day one, then one tablet daily (Patient not taking: Reported on 01/21/2019) 6 each 0  . magic mouthwash w/lidocaine SOLN Take 15 mLs by mouth 3 (three) times daily as needed for mouth pain. (Patient not taking: Reported on 01/21/2019) 240 mL 0  . tamoxifen (NOLVADEX) 20 MG tablet  TAKE 1 TABLET BY MOUTH EVERY DAY (Patient not taking: Reported on 01/21/2019) 90 tablet 1   No current facility-administered medications for this encounter.    Facility-Administered Medications Ordered in Other Encounters  Medication Dose Route Frequency Provider Last Rate Last Dose  . sodium chloride flush (NS) 0.9 % injection 10 mL  10 mL Intracatheter Once [name], [name], MD        REVIEW OF SYSTEMS:  REVIEW OF SYSTEMS: A 10+ POINT REVIEW OF SYSTEMS WAS OBTAINED including neurology, dermatology, psychiatry, cardiac, respiratory, lymph, extremities, GI, GU, musculoskeletal, constitutional, reproductive, HEENT. All pertinent positives are noted in the HPI. All others are negative.   PHYSICAL EXAM:  height is _0  (1.753 m) and weight is 207 lb 6 oz (94.1 kg). Her oral temperature is 98.9 F (37.2 C). Her blood pressure is 157/77 (abnormal) and her pulse is 93. Her respiration is 18 and oxygen saturation is 98%.   Lungs are clear to auscultation  bilaterally. Heart has regular rate and rhythm. No palpable cervical, supraclavicular, or axillary adenopathy. Abdomen soft, non-tender, normal bowel sounds.  Left Chest Wall shows mastectomy scar with evidence of recurrence as documented in image below. Patient also has recurrence extending into the right chest wall area which appears to be more significant than the left. No areas of drainage or bleeding noted along the chest wall area. Patient has some lymphedema in her left arm but no appreciable lymphedema in her right arm.  Disease is documented below in the image. (picture upside down)   Document Information   Photos    01/21/2019 15:00  Attached To:  Hospital Encounter on 01/21/19 with Gery Pray, MD  Source Information   Gery Pray, MD  [facility]    ECOG = 1  0 - Asymptomatic (Fully active, able to carry on all predisease activities without restriction)  1 - Symptomatic but completely ambulatory (Restricted in physically strenuous activity but ambulatory and able to carry out work of a light or sedentary nature. For example, light housework, office work)  2 - Symptomatic, <50% in bed during the day (Ambulatory and capable of all self care but unable to carry out any work activities. Up and about more than 50% of waking hours)  3 - Symptomatic, >50% in bed, but not bedbound (Capable of only limited self-care, confined to bed or chair 50% or more of waking hours)  4 - Bedbound (Completely disabled. Cannot carry on any self-care. Totally confined to bed or chair)  5 - Death   Eustace Pen MM, Creech RH, Tormey DC, et al. [phone]). "Toxicity and response criteria of the [facility]". [place] Oncol. 5 (6): [phone]  LABORATORY DATA:  Lab Results  Component Value Date   WBC 8.2 01/11/2019   HGB 11.1 (L) 01/11/2019   HCT 35.8 (L) 01/11/2019   MCV 94.0 01/11/2019   PLT 245 01/11/2019   NEUTROABS 5.8 01/11/2019   Lab Results  Component Value  Date   NA 138 01/11/2019   K 4.1 01/11/2019   CL 106 01/11/2019   CO2 22 01/11/2019   GLUCOSE 101 (H) 01/11/2019   CREATININE 0.79 01/11/2019   CALCIUM 9.0 01/11/2019      RADIOGRAPHY: No results found.    IMPRESSION: Estrogen receptor positive, HER-2 positive breast cancer; 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 Patient unfortunately has had spread of her triple negative breast cancer into the right chest wall area. She also has recurrence along the left chest wall area, but this  has been previously radiated in [place], [place]. The patient would be a good candidate for radiation treatment directed at the right chest wall area. Based on physical exam today, and since the patient does not have evidence of axillary involvement on the right side, I would recommend radiation treatment just to the right chest wall area (and since her right chest wall recurrence did not start along the right chest area). We discussed the course of treatment, side effects, and potential toxicities of radiation therapy. We discussed there may be potential overlap over the sternum area increasing potential risk for sternal and rib fractures. She understands this issue and is willing to proceed with radiation therapy given the current clinical situation.  Today, I talked to the patient about the findings and work-up thus far.  We discussed the natural history of recurrent breast cancer and general treatment, highlighting the role of radiotherapy in the management.  We discussed the available radiation techniques, and focused on the details of logistics and delivery.  We reviewed the anticipated acute and late sequelae associated with radiation in this setting.  The patient was encouraged to ask questions that I answered to the best of my ability.  A patient consent form was discussed and signed.  We retained a copy for our records.  The patient would like to proceed with radiation and will be scheduled for CT simulation.  PLAN: The  patient will return early next week for CT simulation with treatments to begin soon afterward. We discussed initiation of treatment during the COVID-19 epidemic. Given this rather rapid recurrence of her triple negative breast cancer, I would not recommend delaying initiation of her radiation treatment. I also feel the patient would benefit from Xeloda as a radiation sensitizer and will discuss with Dr. Jana Hakim. I am recommending intensity modulated radiation therapy (IMRT) given her previous radiation therapy and potential for overlap as well as her anatomic setup (pectus excavatum) and to limit dose to the right lung area. Anticipate 6 weeks of radiation therapy.     ------------------------------------------------  [name] Promise, PhD, MD  This document serves as a record of services personally performed by Gery Pray, MD. It was created on his behalf by Rae Lips, a trained medical scribe. The creation of this record is based on the scribe's personal observations and the provider's statements to them. This document has been checked and approved by the attending provider.

## 2019-01-25 ENCOUNTER — Telehealth: Payer: Self-pay | Admitting: Oncology

## 2019-01-25 NOTE — Telephone Encounter (Signed)
Called regarding 4/9 webex

## 2019-01-26 ENCOUNTER — Telehealth: Payer: Self-pay | Admitting: Oncology

## 2019-01-26 MED FILL — TOREMIFENE CITRATE 60 MG TA: 60 | 30 days supply | Qty: 30 | Fill #1

## 2019-01-26 NOTE — Telephone Encounter (Signed)
Spoke with patient regarding her webex visit with Dr. Jana Hakim on Thursday. Sent the patient the join link and told her to download the app.

## 2019-01-27 ENCOUNTER — Other Ambulatory Visit: Payer: Self-pay

## 2019-01-27 ENCOUNTER — Ambulatory Visit
Admission: RE | Admit: 2019-01-27 | Discharge: 2019-01-27 | Disposition: A | Discharge status: Home / Self Care | Payer: BLUE CROSS/BLUE SHIELD | Source: Ambulatory Visit | Attending: Radiation Oncology | Admitting: Radiation Oncology

## 2019-01-27 DIAGNOSIS — C7989 Secondary malignant neoplasm of other specified sites: Secondary | ICD-10-CM | POA: Diagnosis not present

## 2019-01-28 ENCOUNTER — Inpatient Hospital Stay (HOSPITAL_BASED_OUTPATIENT_CLINIC_OR_DEPARTMENT_OTHER): Payer: BLUE CROSS/BLUE SHIELD | Admitting: Oncology

## 2019-01-28 DIAGNOSIS — C7989 Secondary malignant neoplasm of other specified sites: Secondary | ICD-10-CM | POA: Diagnosis not present

## 2019-01-28 DIAGNOSIS — Z90710 Acquired absence of both cervix and uterus: Secondary | ICD-10-CM

## 2019-01-28 DIAGNOSIS — C50212 Malignant neoplasm of upper-inner quadrant of left female breast: Secondary | ICD-10-CM | POA: Diagnosis not present

## 2019-01-28 DIAGNOSIS — Z923 Personal history of irradiation: Secondary | ICD-10-CM

## 2019-01-28 DIAGNOSIS — C7951 Secondary malignant neoplasm of bone: Secondary | ICD-10-CM

## 2019-01-28 DIAGNOSIS — Z9221 Personal history of antineoplastic chemotherapy: Secondary | ICD-10-CM

## 2019-01-28 DIAGNOSIS — Z87891 Personal history of nicotine dependence: Secondary | ICD-10-CM

## 2019-01-28 DIAGNOSIS — Z79899 Other long term (current) drug therapy: Secondary | ICD-10-CM

## 2019-01-28 DIAGNOSIS — Z170 Estrogen receptor positive status [ER+]: Secondary | ICD-10-CM | POA: Diagnosis not present

## 2019-01-28 DIAGNOSIS — Z79810 Long term (current) use of selective estrogen receptor modulators (SERMs): Secondary | ICD-10-CM

## 2019-01-28 DIAGNOSIS — C50912 Malignant neoplasm of unspecified site of left female breast: Secondary | ICD-10-CM

## 2019-01-28 DIAGNOSIS — Z9013 Acquired absence of bilateral breasts and nipples: Secondary | ICD-10-CM

## 2019-01-28 MED ORDER — CAPECITABINE 500 MG PO TABS: 1000.0000 mg | ORAL_TABLET | Freq: Two times a day (BID) | ORAL | 1 refills | Status: DC

## 2019-01-28 NOTE — Progress Notes (Signed)
[place]  Telephone:([phone] Fax:([phone]) [phone]    ID: [name] DOB: Dec 14, 1955  MR#: [id number]  RWE#:[id number]  Patient Care Team: Chesley Noon, MD as PCP - General (Family Medicine) Excell Seltzer, MD as Consulting Physician (General Surgery) Vonnie Ligman, [name], MD as Consulting Physician (Oncology) Gery Pray, MD as Consulting Physician (Radiation Oncology) Collene Gobble, MD as Consulting Physician (Pulmonary Disease) Verdell Carmine, MD as Referring Physician (Internal Medicine) Larey Dresser, MD as Consulting Physician (Cardiology) OTHER MD: Dr Lyda Jester [phone])   CHIEF COMPLAINT: estrogen receptor positive, HER-2 positive breast cancer; 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   I discussed the limitations, risks, security and privacy concerns of performing an evaluation and management service by telemedicine and the availability of in-person appointments. I also discussed with the patient that there may be a patient responsible charge related to this service. The patient expressed understanding and agreed to proceed.   Other persons participating in the visit and their role in the encounter: Patient's daughter  Patients location: Home Providers location: Clinic   INTERVAL HISTORY: I spoke with [name] today to make sure things were going well.  We were not able to establish a WebEx contact because she did not have the app, was using a tablet which did not have an [place] and and could not connect and then her husband's computer would not start.  Accordingly we did a phone visit.  [name] she is receiving toremifene at no cost and is tolerating it well with some hot flashes at  night, which are not a major problem.  She is not having any vaginal wetness or discharge.  She has had no problems with the trastuzumab.  She is due for an echocardiogram sometime this month.  That has not yet been scheduled although the order is in place.  She will receive her next zoledronate dose in May.  Her most recent tumor marker readings are as follows. Results for Debra, Hays (MRN [id number]) as of 01/28/2019 10:52  Ref. Range 09/24/2018 07:47 10/26/2018 08:20 11/16/2018 11:56 12/14/2018 08:46 01/11/2019 12:28  CA 27.29 Latest Ref Range: 0.0 - 38.6 U/mL 86.0 (H) 130.9 (H) 138.9 (H) 203.1 (H) 234.1 (H)   She has met with radiation oncology and Dr. Randa Ngo is planning to start her radiation treatments 02/11/2019, to be continued through late May.  REVIEW OF SYSTEMS: [name] is taking appropriate pandemic precautions.  She is working in her yard.  She occasionally drives around but she is not going to grocery stores or doing any other shopping.  There has been no fever, no cough, no diarrhea, and no shortness of breath.  She has had tightness in the chest wall because of her tumors particularly on the left.  She is not currently doing any stretching exercises.  A detailed review of systems today was otherwise noncontributory  BREAST CANCER HISTORY: From the original intake note:  "[name]" had a fall while traveling living to [place] in their RV and injure her left upper arm and  her left breast . Approximately 3 weeks later she noted a mass in the left breast upper inner quadrant. It was somewhat tender. She brought this to medical attention and on 09/27/2016 underwent bilateral diagnostic mammography with tomography and [facility] The breast density was category B. The right breast was benign. In the left breast upper inner quadrant there was a large lobulated hyperdense mass which by exam was firm and nonmobile. By ultrasonography in the 11:30 o'clock radius 4  cm from the nipple there was a mass extending to the subcutaneous tissues and measuring 4.9 cm. There was a small superficial fluid collection overlying this consistent with a hematoma. The left axilla was benign.  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  The patient's subsequent history is as detailed below   PAST MEDICAL HISTORY: Past Medical History:  Diagnosis Date   Anxiety    CAP (community acquired pneumonia)    Depression    Malignant neoplasm of upper-inner quadrant of left female breast ([place]) 10/01/2016   Personal history of chemotherapy    Tobacco use     PAST SURGICAL HISTORY: Past Surgical History:  Procedure Laterality Date   ABDOMINAL HYSTERECTOMY  2011   Laparoscopic   AUGMENTATION MAMMAPLASTY Bilateral 03/2017   BREAST LUMPECTOMY Left 04/05/2017   BREAST LUMPECTOMY WITH SENTINEL LYMPH NODE BIOPSY Left 04/07/2017   BREAST REDUCTION SURGERY Left 04/07/2017   CHOLECYSTECTOMY     MASTECTOMY Bilateral 08/25/2017   at [facility]   open reduction left ankle     SKIN GRAFT Left 06/19/2017   bil skin grafts to breasts    FAMILY HISTORY: Family History  Problem Relation Age of Onset   Heart disease Mother    Heart disease Father    Cancer Sister        ovarian ca   The patient's father died at the age of 47 from a myocardial infarction. The patient's mother died at the age of 22 with congestive heart failure. The patient had one brother, 2 sisters. One of her sisters was diagnosed with ovarian cancer at age 34 and died at age 89 from that disease   GYNECOLOGIC HISTORY:  No LMP recorded. Patient has had a hysterectomy. Menarche age 75, first live birth age 23. The  patient is GX P1. She stopped having periods approximately 2008. She did not take hormone replacement. She used oral contraceptives for approximately 30 years with no complications.   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 Her husband [name] is retired from [employer]. Their daughter Danajah Birdsell, used to live in [place], but has moved back to [place]. [name]'s husband works in Engineer, technical sales for healthcare systems. The patient welcomed her 1st grandchild, a boy named Rahal in May 2019.       ADVANCED DIRECTIVES: not in place   HEALTH MAINTENANCE: Social History   Tobacco Use   Smoking status: Former Smoker    Packs/day: 0.50    Years: 20.00    Pack years: 10.00    Last attempt to quit: 10/21/2016    Years since quitting: 2.2   Smokeless tobacco: Never Used  Substance Use Topics   Alcohol use: No   Drug use: No     Colonoscopy:  PAP:  Bone density:   Allergies  Allergen Reactions   Propofol Other (See Comments)    Vivid dreams (HAD THIS IN ICU WHEN SHE HAD DOUBLE PNEUMONIA.)  Vivid dreams (HAD THIS IN ICU WHEN SHE HAD DOUBLE PNEUMONIA, with OZHYQM)5784 March; states, "Anesthesiologist said that only happened since I was on it for a prolonged time but I'd be good for use in surgery. Vivid dreams (HAD THIS IN ICU WHEN SHE HAD DOUBLE PNEUMONIA, with ONGEXB)2841 March; states, "Anesthesiologist said that only happened since I was on it for a prolonged time but I'd be good for use in surgery. Vivid dreams (HAD THIS IN ICU WHEN SHE HAD DOUBLE PNEUMONIA.)     Current Outpatient Medications  Medication Sig Dispense Refill   acetaminophen (TYLENOL) 325 MG tablet Take 650 mg by mouth every 6 (six) hours as needed for moderate pain.      ALPRAZolam (XANAX XR) 1 MG 24 hr tablet TABLET BY MOUTH EVERY DAY 30 tablet 1   azithromycin (ZITHROMAX) 250 MG tablet Take 2 tablets day one, then one tablet daily (Patient not  taking: Reported on 01/21/2019) 6 each 0   busPIRone (BUSPAR) 30 MG tablet Take 1 tablet (30 mg total) by mouth 3 (three) times daily. 90 tablet 4   diphenhydrAMINE (BENADRYL) 25 MG tablet Take 50 mg by mouth as needed for sleep.      ibuprofen (ADVIL,MOTRIN) 200 MG tablet Take 600 mg by mouth every 6 (six) hours as needed for moderate pain.      magic mouthwash w/lidocaine SOLN Take 15 mLs by mouth 3 (three) times daily as needed for mouth pain. (Patient not taking: Reported on 01/21/2019) 240 mL 0   omeprazole (PRILOSEC) 40 MG capsule Take 1 capsule (40 mg total) by mouth daily. 60 capsule 2   ondansetron (ZOFRAN) 8 MG tablet Take 1 tablet (8 mg total) by mouth 2 (two) times daily as needed for refractory nausea / vomiting. Start on day 3 after chemotherapy. 30 tablet 1   PARoxetine (PAXIL) 40 MG tablet TAKE 1 TABLET BY MOUTH EVERY DAY IN THE MORNING 90 tablet 3   prochlorperazine (COMPAZINE) 10 MG tablet Take 1 tablet (10 mg total) by mouth every 6 (six) hours as needed (Nausea or vomiting). 30 tablet 1   ranitidine (ZANTAC) 150 MG tablet Take 150 mg by mouth daily as needed for heartburn.      tamoxifen (NOLVADEX) 20 MG tablet TAKE 1 TABLET BY MOUTH EVERY DAY (Patient not taking: Reported on 01/21/2019) 90 tablet 1   tiZANidine (ZANAFLEX) 4 MG tablet TAKE 1 TABLET BY MOUTH EVERYDAY AT BEDTIME 30 tablet 1   Toremifene Citrate (FARESTON) 60 MG tablet Take by mouth.     valACYclovir (VALTREX) 1000 MG tablet Take 1 tablet (1,000 mg total) by mouth daily. 90 tablet 2   No current facility-administered medications for this visit.    Facility-Administered Medications Ordered in Other Visits  Medication Dose Route Frequency Provider Last Rate Last Dose   sodium chloride flush (NS) 0.9 % injection 10 mL  10 mL Intracatheter Once Kaydin Karbowski, [name], MD         OBJECTIVE: Middle-aged white woman   There were no vitals filed for this visit.   There is no height or weight on file to calculate  BMI.   There were no vitals filed for this visit.   ECOG FS:1 - Symptomatic but completely ambulatory    Photo 01/11/2019     Photo 10/19/2018     Chest Wall 09/24/2018 (the right side is up on this photo)     Chest wall 06/29/2018     LAB RESULTS:  CMP  Component Value Date/Time   NA 138 01/11/2019 1228   NA 140 10/24/2017 1307   K 4.1 01/11/2019 1228   K 5.4 (H) 10/24/2017 1307   CL 106 01/11/2019 1228   CO2 22 01/11/2019 1228   CO2 29 10/24/2017 1307   GLUCOSE 101 (H) 01/11/2019 1228   GLUCOSE 103 10/24/2017 1307   BUN 13 01/11/2019 1228   BUN 18.2 10/24/2017 1307   CREATININE 0.79 01/11/2019 1228   CREATININE 0.8 10/24/2017 1307   CALCIUM 9.0 01/11/2019 1228   CALCIUM 10.3 10/24/2017 1307   PROT 7.1 01/11/2019 1228   PROT 7.5 10/24/2017 1307   ALBUMIN 4.0 01/11/2019 1228   ALBUMIN 4.0 10/24/2017 1307   AST 22 01/11/2019 1228   AST 9 10/24/2017 1307   ALT 12 01/11/2019 1228   ALT 8 10/24/2017 1307   ALKPHOS 128 (H) 01/11/2019 1228   ALKPHOS 115 10/24/2017 1307   BILITOT 0.5 01/11/2019 1228   BILITOT 0.28 10/24/2017 1307   GFRNONAA >60 01/11/2019 1228   GFRAA >60 01/11/2019 1228    INo results found for: SPEP, UPEP  Lab Results  Component Value Date   WBC 8.2 01/11/2019   NEUTROABS 5.8 01/11/2019   HGB 11.1 (L) 01/11/2019   HCT 35.8 (L) 01/11/2019   MCV 94.0 01/11/2019   PLT 245 01/11/2019      Chemistry      Component Value Date/Time   NA 138 01/11/2019 1228   NA 140 10/24/2017 1307   K 4.1 01/11/2019 1228   K 5.4 (H) 10/24/2017 1307   CL 106 01/11/2019 1228   CO2 22 01/11/2019 1228   CO2 29 10/24/2017 1307   BUN 13 01/11/2019 1228   BUN 18.2 10/24/2017 1307   CREATININE 0.79 01/11/2019 1228   CREATININE 0.8 10/24/2017 1307      Component Value Date/Time   CALCIUM 9.0 01/11/2019 1228   CALCIUM 10.3 10/24/2017 1307   ALKPHOS 128 (H) 01/11/2019 1228   ALKPHOS 115 10/24/2017 1307   AST 22 01/11/2019 1228   AST 9  10/24/2017 1307   ALT 12 01/11/2019 1228   ALT 8 10/24/2017 1307   BILITOT 0.5 01/11/2019 1228   BILITOT 0.28 10/24/2017 1307       No results found for: LABCA2  No components found for: LABCA125  No results for input(s): INR in the last 168 hours.  Urinalysis    Component Value Date/Time   COLORURINE STRAW (A) 10/30/2017 2050   APPEARANCEUR CLEAR 10/30/2017 2050   LABSPEC 1.006 10/30/2017 2050   [place] 6.0 10/30/2017 2050   GLUCOSEU NEGATIVE 10/30/2017 2050   [place] NEGATIVE 10/30/2017 2050   [place] NEGATIVE 10/30/2017 2050   [place] 10/30/2017 2050   PROTEINUR NEGATIVE 10/30/2017 2050   NITRITE NEGATIVE 10/30/2017 2050   LEUKOCYTESUR NEGATIVE 10/30/2017 2050     STUDIES: No results found.  ELIGIBLE FOR AVAILABLE RESEARCH PROTOCOL: no   ASSESSMENT: 63 y.o. [employer], [place] woman status post left breast upper inner quadrant biopsy 09/30/2016 for a clinical T2 N0, stage II a invasive ductal carcinoma, grade 3, estrogen receptor positive, progesterone receptor negative, HER-2 not amplified, with an MIB-1 of 80%.  (1) genetics testing not actionable (see below).  (2) neoadjuvant chemotherapy given 10/30/2016 to 03/12/2017 consisting of cyclophosphamide and doxorubicin in dose dense fashion 4  followed by paclitaxel 1, then Abraxane (x8?)  further treatments discontinued because of neuropathy.  (3) status post left lumpectomy with oncoplasty and right reduction mammoplasty showing a residual T2 N1  invasive ductal carcinoma, with lymphovascular invasion, but negative margins; the tumor is now HER-2 positive.  (a) postop PET scan showed no evidence of metastatic disease.  (4) adjuvant trastuzumab/pertuzumab started 06/20/2017 (received at [facility]), started here on 07/11/2017--continued through April 2019 when patient left for [place]  (a) Echo on 06/18/2017 at [facility]: LVEF 55-60%  (b) echocardiogram 09/26/2017 shows an ejection fraction in the 60-65%  range.  (c) echocardiogram 12/26/2017 shows an ejection fraction in the 55-60% range  (d) see below for subsequent echocardiogram results   (5) adjuvant radiation was to follow, but see (7) below  (6) anti-estrogens: Letrozole started post-op, discontinued with progression OCT 2018  (a) DEXA on 04/03/2017 demonstrated a T score of -1.3 in the right femoral neck  (b) Zometa given at [facility] on 06/20/2017  RECURRENCE: (7) Patient developed a left breast nodule and underwent mammogram and ultrasound on 08/06/17 showing three areas of concern at 10 o'clock, 1130 o'clock, and at 8 o'clock. Biopsy on 08/07/2017 demonstrated at 11:30 fat necrosis, however at 10:00 it was positive for IDC, grade 3, ER+(50%), PR-(0%), Ki-67 90%, HER-2 negative (ratio 1.48).  (a) staging chest CT 08/15/2017 shows 0.5 cm left lower lobe lung nodule of uncertain significance  (8) status post left modified radical mastectomy and right simple mastectomy 08/25/2017 showing  (a) right breast, no evidence of malignancy  (b) left breast, ypT2 ypN1 invasive ductal carcinoma, grade 3, with negative margins; repeat prognostic panel: Triple negative  (9) started adjuvant chemotherapy with cyclophosphamide/methotrexate/fluorouracil [CMF] 11/10/2016, eight cycles planned  (a) continuing trastuzumab/pertuzumab   (b) CMF chemotherapy discontinued after 4 cycles with disease progression  (10) status post left chest wall biopsy for further local recurrence 01/05/2018, the tumor being now weakly estrogen receptor positive, progesterone receptor negative, and again HER-2 negative  (11) PET scan 03/25/2018 negative except for a 1.2 cm left internal mammary lymph node, SUV 6.9  (12) adjuvant radiation completed 03/19/2018 (30 doses to left chest wall area under Dr Modesto Charon [facility]  (a) did not receive capecitabine sensitization  (13)  Letrozole resumed 03/27/2018--discontinued 06/29/2018  (14) foundation one  01/09/2018 shows no actionable mutations, with stable microsatellite status and low mutational burden.  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 (a) PD-L1 testing on immune cells, 0% staining (negative)--obtained on 08/07/2017 cycle, which was weakly ER positive, progesterone and HER-2 negative (b) genetics testing through [facility] October 31, 2016 found no deleterious mutations in ATM, BRCA1, BRCA2, BRI P1, CDH 1, CH EK 2, EPCAM, MLH1, MSH2, MSH6, NBN, NF1, PALB 2, PMS2, PTEN, RAD 51C, RAD 51D, STK 1 1, or TP 53.  (15) trastuzumab and pertuzumab resumed 03/31/2018 (a) echocardiogram on 04/20/2018 showed an ejection fraction in the 50-55% range.  (b) changed to every 4-week treatments beginning 07/01/2018 to coordinate with fulvestrant (c) echocardiogram 08/17/2018 shows a well-preserved ejection fraction. (d) pertuzumab discontinued as of December 2019  (16) fine-needle aspiration of a chest wall lesion on 06/04/2018 was read as "non-small cell carcinoma".  Punch biopsy obtained at the visit 06/08/2018 for prognostic panel determination showed poorly differentiated carcinoma, estrogen receptor positive at 60%, progesterone receptor negative, HER-2 negative by immunohistochemistry at 1+  (17) fulvestrant started 07/01/2018, repeat every 28 days  (a) mid September started palbociclib 125 mg daily, 21/7,   (b) discontinued with evidence of mixed response  (18) carboplatin/gemcitabine days 1 and 8 started 09/29/2018, repeated every 21 days (a) baseline PET scan 09/23/2018 shows progression on prior treatment, measurable disease (b) CA-27-29 is now  informative   (c) cycle 2 of carboplatin/gemcitabine delayed 1 week  (d) carboplatin/gemcitabine discontinued after 3 cycles with evidence of disease progression  (19) PET scan 12/10/2018 shows progression in chest wall and sternum, stable disease in nodes and other blytic bone lesions  (20) Fareston/toremifene started 12/28/2018  (21) palliative radiation to the  right chest wall scjheduled for 02/11/2019 - 03/18/2019  (a) will receive capecitabine radiosensitization   PLAN:  [name] is generally doing well, having more energy, and feeling more normal now that she is now taking Ibrance.  She is taking appropriate pandemic precautions.  Her echocardiogram has not yet been scheduled.  We are having difficulty Hays routine echoes done.  If it is not done before 01/2019 20 I will still go ahead and treat her at that time.  Today we did discuss capecitabine sensitization and I should have those panels ready for her to pick up from our pharmacy when she comes on oh 4/20.  Otherwise the plan is to continue toremifene and trastuzumab, and then reassess once she completes her radiation treatments  She has a good understanding of the above plan and she agrees with it.    Noora Locascio, [name], MD  01/28/19 10:50 AM Medical Oncology and Hematology [facility] [address] [place], [place] [address] Tel. [phone]    Fax. [phone]  I, Jacqualyn Posey am acting as a Education administrator for Chauncey Cruel, MD.   I, Lurline Del MD, have reviewed the above documentation for accuracy and completeness, and I agree with the above.

## 2019-01-29 ENCOUNTER — Telehealth: Payer: Self-pay | Admitting: Pharmacist

## 2019-01-29 ENCOUNTER — Telehealth: Payer: Self-pay

## 2019-01-29 DIAGNOSIS — C50912 Malignant neoplasm of unspecified site of left female breast: Secondary | ICD-10-CM

## 2019-01-29 MED ORDER — CAPECITABINE 500 MG PO TABS: 1000.0000 mg | ORAL_TABLET | Freq: Two times a day (BID) | ORAL | 1 refills | Status: DC

## 2019-01-29 NOTE — Telephone Encounter (Signed)
Oral Oncology Pharmacist Encounter  Received new prescription for Xeloda (capecitabine) for the treatment of recurrent triple negative breast cancer in conjunction with radiation, planned duration 6 weeks of therapy.  Original diagnosis in Dec 2017. Patient has had multiple instances of breast cancers since that time including some with ER-positivity, some with Her-2 positivity, and some that are triple negative.  Patient currently with chest wall recurrence and in under evaluation to initiate palliative radiation with Xeloda sensitization.  Labs from 01/11/19 assessed, [facility] for treatment initiation.  Current medication list in Epic reviewed, moderate DDI with Xeloda and omeprazole identified:  Category C interaction: concurrent use of a proton pump inhibitor (PPI) was associated with poorer progression free survival and overall survival among patient receiving Xeloda as part of their adjuvant treatment of gastric cancer. A retrospective analysis of patient receiving Xeloda for adjuvant treatment of colorectal cancer did not find detrimental effects on important efficacy outcomes with concurrent use of a PPI. Patient will be screened for ability to discontinue use of PPI. If unable to be discontinued, no change to current therapy is indicated at this time.  Prescription has been e-scribed to the [facility] for benefits analysis and approval.  Oral Oncology Clinic will continue to follow for insurance authorization, copayment issues, initial counseling and start date.  Johny Drilling, PharmD, BCPS, BCOP  01/29/2019 8:39 AM Oral Oncology Clinic [phone]

## 2019-01-29 NOTE — Telephone Encounter (Signed)
Oral Oncology Patient Advocate Encounter  Prior Authorization for Xeloda has been approved.    PA# [id number] Effective dates: 01/29/19 through 01/28/20  Oral Oncology Clinic will continue to follow.   [place] Patient [place] Phone [phone] Fax [phone] 01/29/2019    2:40 PM

## 2019-01-29 NOTE — Telephone Encounter (Signed)
Oral Oncology Patient Advocate Encounter  Received notification from [facility] that prior authorization for Xeloda is required.  PA submitted on CoverMyMeds Key [id number] Status is pending  Oral Oncology Clinic will continue to follow.  [place] Patient [place] Phone [phone] Fax [phone] 01/29/2019    9:52 AM

## 2019-01-31 ENCOUNTER — Other Ambulatory Visit: Payer: Self-pay | Admitting: Oncology

## 2019-02-01 MED FILL — CAPECITABINE 500 MG TABS: 500 | 30 days supply | Qty: 90 | Fill #0

## 2019-02-01 NOTE — Telephone Encounter (Signed)
Oral Oncology Patient Advocate Encounter  Confirmed with [place] that Xeloda was shipped on 02/01/19 with a $0 copay.   [place] Patient [place] Phone [phone] Fax [phone] 02/01/2019   3:26 PM

## 2019-02-01 NOTE — Telephone Encounter (Signed)
Oral Chemotherapy Pharmacist Encounter   I spoke with patient for overview of: Xeloda (capecitabine) for the treatment of recurrent triple negative breast cancer in conjunction with radiation, planned duration 6 weeks of therapy.  Patient is currently scheduled for 30 treatment days  Counseled patient on administration, dosing, side effects, monitoring, drug-food interactions, safe handling, storage, and disposal.  Patient will take Xeloda 500mg  tablets, 2 tablets (1000mg ) by mouth in AM and 2 tabs (1000mg ) by mouth in PM, within 30 minutes of finishing meals, on days of radiation only.  Xeloda and radiation start date: 02/04/2019 Radiation planned for 02/04/2019-03/18/2019  Adverse effects of Xeloda include but are not limited to: fatigue, decreased blood counts, GI upset, diarrhea, mouth sores, and hand-foot syndrome.  Patient has anti-emetic on hand and knows to take it if nausea develops.   Patient will obtain anti diarrheal and alert the office of 4 or more loose stools above baseline.   Reviewed with patient importance of keeping a medication schedule and plan for any missed doses.  Mrs. Houchin voiced understanding and appreciation.   All questions answered.  Medication reconciliation performed and medication/allergy list updated.  The first quantity #90 Xeloda tablets will ship from the [place] [place] long outpatient pharmacy today, 02/01/2019, for delivery to patient's home tomorrow. Copayment for first fill of Xeloda is $0 The [facility] long outpatient pharmacy will reach out to patient 5-7 days prior to needing remaining fill of Xeloda to coordinate continued medication acquisition and prevent break in therapy. During refill call they will calculate remaining tablets needed to finish entire course of radiation and dispense that #.  Patient knows to call the office with questions or concerns. Oral Oncology Clinic will continue to follow.  Johny Drilling, PharmD, BCPS, BCOP  02/01/2019  10:26  AM Oral Oncology Clinic [phone]

## 2019-02-02 ENCOUNTER — Other Ambulatory Visit: Payer: Self-pay | Admitting: Oncology

## 2019-02-02 DIAGNOSIS — C50212 Malignant neoplasm of upper-inner quadrant of left female breast: Secondary | ICD-10-CM

## 2019-02-02 DIAGNOSIS — Z170 Estrogen receptor positive status [ER+]: Principal | ICD-10-CM

## 2019-02-03 ENCOUNTER — Other Ambulatory Visit: Payer: Self-pay | Admitting: Oncology

## 2019-02-03 ENCOUNTER — Ambulatory Visit: Payer: BLUE CROSS/BLUE SHIELD | Admitting: Radiation Oncology

## 2019-02-03 DIAGNOSIS — C7989 Secondary malignant neoplasm of other specified sites: Secondary | ICD-10-CM | POA: Diagnosis not present

## 2019-02-03 NOTE — Progress Notes (Unsigned)
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 The report concludes there is no significant abnormality noted in the results (that would allow patients to enroll in a current trial at [facility]). 

## 2019-02-04 ENCOUNTER — Ambulatory Visit
Admission: RE | Admit: 2019-02-04 | Discharge: 2019-02-04 | Disposition: A | Discharge status: Home / Self Care | Payer: BLUE CROSS/BLUE SHIELD | Source: Ambulatory Visit | Attending: Radiation Oncology | Admitting: Radiation Oncology

## 2019-02-04 ENCOUNTER — Other Ambulatory Visit: Payer: Self-pay

## 2019-02-04 DIAGNOSIS — C7989 Secondary malignant neoplasm of other specified sites: Secondary | ICD-10-CM | POA: Diagnosis not present

## 2019-02-05 ENCOUNTER — Ambulatory Visit
Admission: RE | Admit: 2019-02-05 | Discharge: 2019-02-05 | Disposition: A | Discharge status: Home / Self Care | Payer: BLUE CROSS/BLUE SHIELD | Source: Ambulatory Visit | Attending: Radiation Oncology | Admitting: Radiation Oncology

## 2019-02-05 ENCOUNTER — Other Ambulatory Visit: Payer: Self-pay

## 2019-02-05 DIAGNOSIS — C7989 Secondary malignant neoplasm of other specified sites: Secondary | ICD-10-CM | POA: Diagnosis not present

## 2019-02-06 ENCOUNTER — Other Ambulatory Visit: Payer: Self-pay | Admitting: Nurse Practitioner

## 2019-02-07 NOTE — Progress Notes (Signed)
[name]  [phone] [phone]    ID: [name] DOB: [birth year]  MR#: [id number]  JAS#:[id number]  Patient Care Team: Chesley Noon, MD as PCP - General (Family Medicine) Excell Seltzer, MD as Consulting Physician (General Surgery) Leesa Leifheit, [name], MD as Consulting Physician (Oncology) Gery Pray, MD as Consulting Physician (Radiation Oncology) Collene Gobble, MD as Consulting Physician (Pulmonary Disease) Verdell Carmine, MD as Referring Physician (Internal Medicine) Larey Dresser, MD as Consulting Physician (Cardiology) OTHER MD: Dr Lyda Jester [phone])   CHIEF COMPLAINT: estrogen receptor positive, HER-2 positive breast cancer; 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   She is currently receiving palliative IMRT treatments to the right chest wall area, which she will receive 30 treatments in total.  She has had 2 treatments so far and has been tolerating them well.  She feels there may be a little bit of puffiness in her right chest wall and wonders if some of the more active lesions may "burst" on the right chest wall area  She continues on toremifene.  She has occasional hot flashes at night which can wake her up but it is not every night.  She does not have hot flashes during the day.  She also continues on trastuzumab, with her most recent dose on 01/11/2019. She notes some boney aches, which she treats with ibuprofen and Tylenol BID. Her boney aches she notices more on more active days, which have not occurred in the same spot, but seem to have occurred in the lower half.  It is unlikely these of anything to do with the trastuzumab  In addition, she continues on  zoledronate, with her most recent dose on 12/14/2018.   Maizee's last echocardiogram on 08/17/2018, showed an ejection fraction in the 60% - 65% range.  She has an order for repeat echocardiogram but this has not been scheduled given the current pandemic  Since her last visit here, she has not undergone any additional studies.   Results for [name], [name] (MRN [id number]) as of 02/07/2019 11:34  Ref. Range 09/24/2018 07:47 10/26/2018 08:20 11/16/2018 11:56 12/14/2018 08:46 01/11/2019 12:28  CA 27.29 Latest Ref Range: 0.0 - 38.6 U/mL 86.0 (H) 130.9 (H) 138.9 (H) 203.1 (H) 234.1 (H)     REVIEW OF SYSTEMS: Aritha does not have a formal exercise routine, but she has continues to complete her housework and yard work. Her family walks, but she doesn't want to get out often. She notes that she doesn't really want to get out, which she isn't sure if it is due to her emotional state or not; she states that she just feels "blah" at times.   As part of COVID-19 precautions, everyone in her household is working from home. Only one person goes out to the grocery as well and they take appropriate precautions against the spread of the virus.  .  The patient denies unusual headaches, visual changes, nausea, vomiting, or dizziness. There has been no unusual cough, phlegm production, or pleurisy. This been no change in bowel or bladder habits. The patient denies unexplained fatigue or unexplained weight loss, bleeding, rash, or fever. A detailed review of systems was otherwise noncontributory.    BREAST CANCER HISTORY: From the original intake note:  "[name]" had a fall while traveling living to  [place] in their RV and injure her left upper arm and  her left breast . Approximately 3 weeks later she noted a mass in the left breast upper inner quadrant. It was somewhat tender. She brought this to medical attention and on 09/27/2016 underwent bilateral diagnostic mammography with tomography and [facility] The breast density was category B. The right breast was benign. In the left breast upper inner quadrant there was a large lobulated hyperdense mass which by exam was firm and nonmobile. By ultrasonography in the 11:30 o'clock radius 4 cm from the nipple there was a mass extending to the subcutaneous tissues and measuring 4.9 cm. There was a small superficial fluid collection overlying this consistent with a hematoma. The left axilla was benign.  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  The patient's subsequent history is as detailed below   PAST MEDICAL HISTORY: Past Medical History:  Diagnosis Date  . Anxiety   . CAP (community acquired pneumonia)   . Depression   . Malignant neoplasm of upper-inner quadrant of left female breast ([place]) 10/01/2016  . Personal history of chemotherapy   . Tobacco use     PAST SURGICAL HISTORY: Past Surgical History:  Procedure Laterality Date  . ABDOMINAL HYSTERECTOMY  2011   Laparoscopic  . AUGMENTATION MAMMAPLASTY Bilateral 03/2017  . BREAST LUMPECTOMY Left 04/05/2017  . BREAST LUMPECTOMY WITH SENTINEL LYMPH NODE BIOPSY Left 04/07/2017  . BREAST REDUCTION SURGERY Left 04/07/2017  . CHOLECYSTECTOMY    . MASTECTOMY Bilateral 08/25/2017   at [facility]  . open reduction left ankle    . SKIN GRAFT Left 06/19/2017   bil skin grafts to breasts    FAMILY HISTORY: Family History  Problem Relation Age of Onset  . Heart disease Mother   . Heart disease Father   . Cancer Sister        ovarian ca   The patient's father died at the age of 66 from a myocardial infarction. The patient's mother died at the age of 73 with congestive heart failure. The  patient had one brother, 2 sisters. One of her sisters was diagnosed with ovarian cancer at age 12 and died at age 86 from that disease   GYNECOLOGIC HISTORY:  No LMP recorded. Patient has had a hysterectomy. Menarche age 57, first live birth age 81. The patient is GX P1. She stopped having periods approximately 2008. She did not take hormone replacement. She used oral contraceptives for approximately 30 years with no complications.   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 Her husband [name] is retired from [employer]. Their daughter Donnetta Gillin, used to live in [place], but has moved back to [place]. [name]'s husband works in Engineer, technical sales for healthcare systems. The patient welcomed her 1st grandchild, a boy named Gal in May 2019.       ADVANCED DIRECTIVES: not in place   HEALTH MAINTENANCE: Social History   Tobacco Use  . Smoking status: Former Smoker    Packs/day: 0.50    Years: 20.00    Pack years: 10.00    Last attempt to quit: 10/21/2016    Years since quitting: 2.3  . Smokeless  tobacco: Never Used  Substance Use Topics  . Alcohol use: No  . Drug use: No     Colonoscopy:  PAP:  Bone density:   Allergies  Allergen Reactions  . Propofol Other (See Comments)    Vivid dreams (HAD THIS IN ICU WHEN SHE HAD DOUBLE PNEUMONIA.) Vivid dreams (HAD THIS IN ICU WHEN SHE HAD DOUBLE PNEUMONIA, with VZDGLO)7564 March; states, "Anesthesiologist said that only happened since I was on it for a prolonged time but I'd be good for use in surgery. Vivid dreams (HAD THIS IN ICU WHEN SHE HAD DOUBLE PNEUMONIA, with PPIRJJ)8841 March; states, "Anesthesiologist said that only happened since I was on it for a prolonged time but I'd be good for use in surgery. Vivid dreams (HAD THIS IN ICU WHEN SHE HAD DOUBLE PNEUMONIA.)     Current Outpatient Medications  Medication Sig Dispense Refill  . acetaminophen (TYLENOL) 325 MG  tablet Take 650 mg by mouth every 6 (six) hours as needed for moderate pain.     Marland Kitchen ALPRAZolam (XANAX XR) 1 MG 24 hr tablet TAKE 1 TABLET BY MOUTH EVERY DAY 30 tablet 1  . busPIRone (BUSPAR) 30 MG tablet Take 1 tablet (30 mg total) by mouth 3 (three) times daily. 90 tablet 4  . capecitabine (XELODA) 500 MG tablet Take 2 tablets (1,000 mg total) by mouth 2 (two) times daily after a meal. Take on days of radiation only, M-F 90 tablet 1  . diphenhydrAMINE (BENADRYL) 25 MG tablet Take 50 mg by mouth as needed for sleep.     Marland Kitchen ibuprofen (ADVIL,MOTRIN) 200 MG tablet Take 600 mg by mouth every 6 (six) hours as needed for moderate pain.     . magic mouthwash w/lidocaine SOLN Take 15 mLs by mouth 3 (three) times daily as needed for mouth pain. (Patient not taking: Reported on 01/21/2019) 240 mL 0  . omeprazole (PRILOSEC) 40 MG capsule Take 1 capsule (40 mg total) by mouth daily. 60 capsule 2  . ondansetron (ZOFRAN) 8 MG tablet Take 1 tablet (8 mg total) by mouth 2 (two) times daily as needed for refractory nausea / vomiting. Start on day 3 after chemotherapy. 30 tablet 1  . PARoxetine (PAXIL) 40 MG tablet TAKE 1 TABLET BY MOUTH EVERY DAY IN THE MORNING 90 tablet 3  . prochlorperazine (COMPAZINE) 10 MG tablet Take 1 tablet (10 mg total) by mouth every 6 (six) hours as needed (Nausea or vomiting). 30 tablet 1  . tiZANidine (ZANAFLEX) 4 MG tablet TAKE 1 TABLET BY MOUTH EVERYDAY AT BEDTIME 30 tablet 1  . Toremifene Citrate (FARESTON) 60 MG tablet Take by mouth.    . valACYclovir (VALTREX) 1000 MG tablet Take 1 tablet (1,000 mg total) by mouth daily. 90 tablet 2   No current facility-administered medications for this visit.    Facility-Administered Medications Ordered in Other Visits  Medication Dose Route Frequency Provider Last Rate Last Dose  . sodium chloride flush (NS) 0.9 % injection 10 mL  10 mL Intracatheter Once Angelynn Lemus, [name], MD         OBJECTIVE: Middle-aged white woman who appears stated age   Vitals:   02/08/19 0909  BP: (!) 140/91  Pulse: (!) 101  Resp: 18  Temp: 98.9 F (37.2 C)  SpO2: 99%     Body mass index is 30.26 kg/m.   Filed Weights   02/08/19 0909  Weight: 204 lb 14.4 oz (92.9 kg)     ECOG FS:1 - Symptomatic but completely ambulatory  Sclerae unicteric, EOMs intact No cervical or supraclavicular adenopathy Lungs no rales or rhonchi Heart regular rate and rhythm Abd soft, nontender, positive bowel sounds MSK no focal spinal tenderness, no upper extremity lymphedema Neuro: nonfocal, well oriented, appropriate affect Breasts: She is status post bilateral mastectomies.  The lesions in the left chest wall appears stable.  The lesions on the right chest wall are minimally inflamed.  There is no significant erythema  Photo 01/11/2019     Photo 10/19/2018     Chest Wall 09/24/2018 (the right side is up on this photo)     Chest wall 06/29/2018     LAB RESULTS:  CMP     Component Value Date/Time   NA 138 01/11/2019 1228   NA 140 10/24/2017 1307   K 4.1 01/11/2019 1228   K 5.4 (H) 10/24/2017 1307   CL 106 01/11/2019 1228   CO2 22 01/11/2019 1228   CO2 29 10/24/2017 1307   GLUCOSE 101 (H) 01/11/2019 1228   GLUCOSE 103 10/24/2017 1307   BUN 13 01/11/2019 1228   BUN 18.2 10/24/2017 1307   CREATININE 0.79 01/11/2019 1228   CREATININE 0.8 10/24/2017 1307   CALCIUM 9.0 01/11/2019 1228   CALCIUM 10.3 10/24/2017 1307   PROT 7.1 01/11/2019 1228   PROT 7.5 10/24/2017 1307   ALBUMIN 4.0 01/11/2019 1228   ALBUMIN 4.0 10/24/2017 1307   AST 22 01/11/2019 1228   AST 9 10/24/2017 1307   ALT 12 01/11/2019 1228   ALT 8 10/24/2017 1307   ALKPHOS 128 (H) 01/11/2019 1228   ALKPHOS 115 10/24/2017 1307   BILITOT 0.5 01/11/2019 1228   BILITOT 0.28 10/24/2017 1307   GFRNONAA >60 01/11/2019 1228   GFRAA >60 01/11/2019 1228    INo results found for: SPEP, UPEP  Lab Results  Component Value Date   WBC 8.9 02/08/2019   NEUTROABS 7.4 02/08/2019    HGB 11.4 (L) 02/08/2019   HCT 38.0 02/08/2019   MCV 89.0 02/08/2019   PLT 327 02/08/2019      Chemistry      Component Value Date/Time   NA 138 01/11/2019 1228   NA 140 10/24/2017 1307   K 4.1 01/11/2019 1228   K 5.4 (H) 10/24/2017 1307   CL 106 01/11/2019 1228   CO2 22 01/11/2019 1228   CO2 29 10/24/2017 1307   BUN 13 01/11/2019 1228   BUN 18.2 10/24/2017 1307   CREATININE 0.79 01/11/2019 1228   CREATININE 0.8 10/24/2017 1307      Component Value Date/Time   CALCIUM 9.0 01/11/2019 1228   CALCIUM 10.3 10/24/2017 1307   ALKPHOS 128 (H) 01/11/2019 1228   ALKPHOS 115 10/24/2017 1307   AST 22 01/11/2019 1228   AST 9 10/24/2017 1307   ALT 12 01/11/2019 1228   ALT 8 10/24/2017 1307   BILITOT 0.5 01/11/2019 1228   BILITOT 0.28 10/24/2017 1307       No results found for: LABCA2  No components found for: LABCA125  No results for input(s): INR in the last 168 hours.  Urinalysis    Component Value Date/Time   COLORURINE STRAW (A) 10/30/2017 2050   APPEARANCEUR CLEAR 10/30/2017 2050   LABSPEC 1.006 10/30/2017 2050   [place] 6.0 10/30/2017 2050   GLUCOSEU NEGATIVE 10/30/2017 2050   [place] NEGATIVE 10/30/2017 2050   [place] NEGATIVE 10/30/2017 2050   [place] 10/30/2017 2050   PROTEINUR NEGATIVE 10/30/2017 2050   NITRITE NEGATIVE 10/30/2017 2050   LEUKOCYTESUR NEGATIVE 10/30/2017 2050  STUDIES: No results found.  ELIGIBLE FOR AVAILABLE RESEARCH PROTOCOL: no   ASSESSMENT: 63 y.o. [employer], [place] woman status post left breast upper inner quadrant biopsy 09/30/2016 for a clinical T2 N0, stage II a invasive ductal carcinoma, grade 3, estrogen receptor positive, progesterone receptor negative, HER-2 not amplified, with an MIB-1 of 80%.  (1) genetics testing not actionable (see below).  (2) neoadjuvant chemotherapy given 10/30/2016 to 03/12/2017 consisting of cyclophosphamide and doxorubicin in dose dense fashion 4  followed by paclitaxel 1, then  Abraxane (x8?)  further treatments discontinued because of neuropathy.  (3) status post left lumpectomy with oncoplasty and right reduction mammoplasty showing a residual T2 N1 invasive ductal carcinoma, with lymphovascular invasion, but negative margins; the tumor is now HER-2 positive.  (a) postop PET scan showed no evidence of metastatic disease.  (4) adjuvant trastuzumab/pertuzumab started 06/20/2017 (received [facility]), started here on 07/11/2017--continued through April 2019 when patient left for [place]  (a) Echo on 06/18/2017 at [facility]: LVEF 55-60%  (b) echocardiogram 09/26/2017 shows an ejection fraction in the 60-65% range.  (c) echocardiogram 12/26/2017 shows an ejection fraction in the 55-60% range  (d) see below for subsequent echocardiogram results   (5) adjuvant radiation was to follow, but see (7) below  (6) anti-estrogens: Letrozole started post-op, discontinued with progression OCT 2018  (a) DEXA on 04/03/2017 demonstrated a T score of -1.3 in the right femoral neck  (b) Zometa given at [facility] on 06/20/2017  RECURRENCE: (7) Patient developed a left breast nodule and underwent mammogram and ultrasound on 08/06/17 showing three areas of concern at 10 o'clock, 1130 o'clock, and at 8 o'clock. Biopsy on 08/07/2017 demonstrated at 11:30 fat necrosis, however at 10:00 it was positive for IDC, grade 3, ER+(50%), PR-(0%), Ki-67 90%, HER-2 negative (ratio 1.48).  (a) staging chest CT 08/15/2017 shows 0.5 cm left lower lobe lung nodule of uncertain significance  (8) status post left modified radical mastectomy and right simple mastectomy 08/25/2017 showing  (a) right breast, no evidence of malignancy  (b) left breast, ypT2 ypN1 invasive ductal carcinoma, grade 3, with negative margins; repeat prognostic panel: Triple negative  (9) started adjuvant chemotherapy with cyclophosphamide/methotrexate/fluorouracil [CMF] 11/10/2016, eight cycles planned  (a) continuing trastuzumab/pertuzumab    (b) CMF chemotherapy discontinued after 4 cycles with disease progression  (10) status post left chest wall biopsy for further local recurrence 01/05/2018, the tumor being now weakly estrogen receptor positive, progesterone receptor negative, and again HER-2 negative  (11) PET scan 03/25/2018 negative except for a 1.2 cm left internal mammary lymph node, SUV 6.9  (12) adjuvant radiation completed 03/19/2018 (30 doses to left chest wall area under Dr Modesto Charon at [facility]  (a) did not receive capecitabine sensitization  (13)  Letrozole resumed 03/27/2018--discontinued 06/29/2018  (14) foundation one 01/09/2018 shows no actionable mutations, with stable microsatellite status and low mutational burden.  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 (a) PD-L1 testing on immune cells, 0% staining (negative)--obtained on 08/07/2017 cycle, which was weakly ER positive, progesterone and HER-2 negative (b) genetics testing through [facility] October 31, 2016 found no deleterious mutations in ATM, BRCA1, BRCA2, BRI P1, CDH 1, CH EK 2, EPCAM, MLH1, MSH2, MSH6, NBN, NF1, PALB 2, PMS2, PTEN, RAD 51C, RAD 51D, STK 1 1, or TP 53.  (15) trastuzumab and pertuzumab resumed 03/31/2018 (a) echocardiogram on 04/20/2018 showed an ejection fraction in the 50-55% range.  (b) changed to every 4-week treatments beginning 07/01/2018 to coordinate with fulvestrant (c) echocardiogram 08/17/2018 shows a  well-preserved ejection fraction. (d) pertuzumab discontinued as of December 2019  (16) fine-needle aspiration of a chest wall lesion on 06/04/2018 was read as "non-small cell carcinoma".  Punch biopsy obtained at the visit 06/08/2018 for prognostic panel determination showed poorly differentiated carcinoma, estrogen receptor positive at 60%, progesterone receptor negative, HER-2 negative by immunohistochemistry at 1+  (17) fulvestrant started 07/01/2018, repeat every 28 days  (a) mid September started palbociclib 125 mg  daily, 21/7,   (b) discontinued with evidence of mixed response  (18) carboplatin/gemcitabine days 1 and 8 started 09/29/2018, repeated every 21 days (a) baseline PET scan 09/23/2018 shows progression on prior treatment, measurable disease (b) CA-27-29 is now informative   (c) cycle 2 of carboplatin/gemcitabine delayed 1 week  (d) carboplatin/gemcitabine discontinued after 3 cycles with evidence of disease progression  (19) PET scan 12/10/2018 shows progression in chest wall and sternum, stable disease in nodes and other blytic bone lesions  (20) Fareston/toremifene started 12/28/2018  (21) palliative radiation to the right chest wall scjheduled for 02/11/2019 - 03/18/2019  (a) will receive capecitabine radiosensitization   PLAN:  [name] is tolerating radiation well so far although of course she has only had 2 doses.  She is having no side effects from the capecitabine that she is aware of.  We are continuing that.  She is tolerating the Fareston well, with some hot flashes as the only side effect.  She has no problems with the trastuzumab.  She has no symptoms suggestive of congestive heart failure.  We are having difficulty getting her an echo at this point and I would rather treat her without the echo than expose her unnecessarily to the virus.  Hopefully next month we will be able to obtain an echo.  At this point then we are continuing with all of the above.  She will receive zoledronate with her next visit which will be 03/08/2019.  She is following appropriate pandemic precautions  She knows to call for any other issues that may develop before the next visit.    Durward Matranga, [name], MD  02/08/19 9:37 AM Medical Oncology and Hematology [facility] [address] [place], [place] [address] Tel. [phone]    Fax. [phone]  I, Jacqualyn Posey am acting as a Education administrator for Chauncey Cruel, MD.   I, Lurline Del MD, have reviewed the above documentation for  accuracy and completeness, and I agree with the above.

## 2019-02-08 ENCOUNTER — Ambulatory Visit
Admission: RE | Admit: 2019-02-08 | Discharge: 2019-02-08 | Disposition: A | Discharge status: Home / Self Care | Payer: BLUE CROSS/BLUE SHIELD | Source: Ambulatory Visit | Attending: Radiation Oncology | Admitting: Radiation Oncology

## 2019-02-08 ENCOUNTER — Inpatient Hospital Stay: Payer: BLUE CROSS/BLUE SHIELD

## 2019-02-08 ENCOUNTER — Inpatient Hospital Stay (HOSPITAL_BASED_OUTPATIENT_CLINIC_OR_DEPARTMENT_OTHER): Payer: BLUE CROSS/BLUE SHIELD | Admitting: Oncology

## 2019-02-08 ENCOUNTER — Other Ambulatory Visit: Payer: Self-pay

## 2019-02-08 VITALS — BP 140/91 | HR 101 | Temp 98.9°F | Resp 18 | Ht 69.0 in | Wt 204.9 lb

## 2019-02-08 DIAGNOSIS — C7951 Secondary malignant neoplasm of bone: Secondary | ICD-10-CM

## 2019-02-08 DIAGNOSIS — Z170 Estrogen receptor positive status [ER+]: Secondary | ICD-10-CM

## 2019-02-08 DIAGNOSIS — C7989 Secondary malignant neoplasm of other specified sites: Secondary | ICD-10-CM

## 2019-02-08 DIAGNOSIS — C50212 Malignant neoplasm of upper-inner quadrant of left female breast: Secondary | ICD-10-CM

## 2019-02-08 DIAGNOSIS — Z5112 Encounter for antineoplastic immunotherapy: Secondary | ICD-10-CM | POA: Insufficient documentation

## 2019-02-08 DIAGNOSIS — C50912 Malignant neoplasm of unspecified site of left female breast: Secondary | ICD-10-CM

## 2019-02-08 DIAGNOSIS — Z95828 Presence of other vascular implants and grafts: Secondary | ICD-10-CM

## 2019-02-08 LAB — CBC WITH DIFFERENTIAL/PLATELET
Abs Immature Granulocytes: 0.02 10*3/uL (ref 0.00–0.07)
Basophils Absolute: 0.1 10*3/uL (ref 0.0–0.1)
Basophils Relative: 1 %
Eosinophils Absolute: 0.2 10*3/uL (ref 0.0–0.5)
Eosinophils Relative: 3 %
HCT: 38 % (ref 36.0–46.0)
Hemoglobin: 11.4 g/dL — ABNORMAL LOW (ref 12.0–15.0)
Immature Granulocytes: 0 %
Lymphocytes Relative: 8 %
Lymphs Abs: 0.7 10*3/uL (ref 0.7–4.0)
MCH: 26.7 pg (ref 26.0–34.0)
MCHC: 30 g/dL (ref 30.0–36.0)
MCV: 89 fL (ref 80.0–100.0)
Monocytes Absolute: 0.5 10*3/uL (ref 0.1–1.0)
Monocytes Relative: 6 %
Neutro Abs: 7.4 10*3/uL (ref 1.7–7.7)
Neutrophils Relative %: 82 %
Platelets: 327 10*3/uL (ref 150–400)
RBC: 4.27 MIL/uL (ref 3.87–5.11)
RDW: 15.2 % (ref 11.5–15.5)
WBC: 8.9 10*3/uL (ref 4.0–10.5)
nRBC: 0 % (ref 0.0–0.2)

## 2019-02-08 LAB — CMP (CANCER CENTER ONLY)
ALT: 8 U/L (ref 0–44)
AST: 31 U/L (ref 15–41)
Albumin: 3.4 g/dL — ABNORMAL LOW (ref 3.5–5.0)
Alkaline Phosphatase: 128 U/L — ABNORMAL HIGH (ref 38–126)
Anion gap: 10 (ref 5–15)
BUN: 15 mg/dL (ref 8–23)
CO2: 21 mmol/L — ABNORMAL LOW (ref 22–32)
Calcium: 8.7 mg/dL — ABNORMAL LOW (ref 8.9–10.3)
Chloride: 106 mmol/L (ref 98–111)
Creatinine: 0.8 mg/dL (ref 0.44–1.00)
GFR, Est AFR Am: >60 mL/min (ref >60)
GFR, Estimated: >60 mL/min (ref >60)
Glucose, Bld: 120 mg/dL — ABNORMAL HIGH (ref 70–99)
Potassium: 4.1 mmol/L (ref 3.5–5.1)
Sodium: 137 mmol/L (ref 135–145)
Total Bilirubin: 0.3 mg/dL (ref 0.3–1.2)
Total Protein: 7.1 g/dL (ref 6.5–8.1)

## 2019-02-08 MED ORDER — ACETAMINOPHEN 325 MG PO TABS
ORAL_TABLET | ORAL | Status: AC
  Filled 2019-02-08: qty 2

## 2019-02-08 MED ORDER — DIPHENHYDRAMINE HCL 25 MG PO CAPS
25.0000 mg | ORAL_CAPSULE | Freq: Once | ORAL | Status: AC
  Administered 2019-02-08: 25 mg via ORAL

## 2019-02-08 MED ORDER — HEPARIN SOD (PORK) LOCK FLUSH 100 UNIT/ML IV SOLN
500.0000 [IU] | Freq: Once | INTRAVENOUS | Status: AC | PRN
  Administered 2019-02-08: 13:00:00 500 [IU]
  Filled 2019-02-08: qty 5

## 2019-02-08 MED ORDER — ACETAMINOPHEN 325 MG PO TABS
650.0000 mg | ORAL_TABLET | Freq: Once | ORAL | Status: AC
  Administered 2019-02-08: 650 mg via ORAL

## 2019-02-08 MED ORDER — DIPHENHYDRAMINE HCL 25 MG PO CAPS
ORAL_CAPSULE | ORAL | Status: AC
  Filled 2019-02-08: qty 1

## 2019-02-08 MED ORDER — SODIUM CHLORIDE 0.9% FLUSH
10.0000 mL | INTRAVENOUS | Status: DC | PRN
  Administered 2019-02-08: 13:00:00 10 mL
  Filled 2019-02-08: qty 10

## 2019-02-08 MED ORDER — SODIUM CHLORIDE 0.9% FLUSH
10.0000 mL | INTRAVENOUS | Status: DC | PRN
  Administered 2019-02-08: 10 mL
  Filled 2019-02-08: qty 10

## 2019-02-08 MED ORDER — TRASTUZUMAB CHEMO 150 MG IV SOLR
6.0000 mg/kg | Freq: Once | INTRAVENOUS | Status: AC
  Administered 2019-02-08: 567 mg via INTRAVENOUS
  Filled 2019-02-08: qty 27

## 2019-02-08 MED ORDER — SODIUM CHLORIDE 0.9 % IV SOLN
Freq: Once | INTRAVENOUS | Status: AC
  Administered 2019-02-08: 11:00:00 via INTRAVENOUS
  Filled 2019-02-08: qty 250

## 2019-02-08 NOTE — Patient Instructions (Signed)

## 2019-02-08 NOTE — Patient Instructions (Signed)
[facility] Discharge Instructions for Patients Receiving Chemotherapy Today you received the following chemotherapy agents:  Herceptin To help prevent nausea and vomiting after your treatment, we encourage you to take your nausea medication as prescribed.   If you develop nausea and vomiting that is not controlled by your nausea medication, call the clinic.   BELOW ARE SYMPTOMS THAT SHOULD BE REPORTED IMMEDIATELY:  *FEVER GREATER THAN 100.5 F  *CHILLS WITH OR WITHOUT FEVER  NAUSEA AND VOMITING THAT IS NOT CONTROLLED WITH YOUR NAUSEA MEDICATION  *UNUSUAL SHORTNESS OF BREATH  *UNUSUAL BRUISING OR BLEEDING  TENDERNESS IN MOUTH AND THROAT WITH OR WITHOUT PRESENCE OF ULCERS  *URINARY PROBLEMS  *BOWEL PROBLEMS  UNUSUAL RASH Items with * indicate a potential emergency and should be followed up as soon as possible.  Feel free to call the clinic should you have any questions or concerns. The clinic phone number is [phone].  Please show the CHEMO ALERT CARD at check-in to the Emergency Department and triage nurse.   

## 2019-02-09 ENCOUNTER — Ambulatory Visit
Admission: RE | Admit: 2019-02-09 | Discharge: 2019-02-09 | Disposition: A | Discharge status: Home / Self Care | Payer: BLUE CROSS/BLUE SHIELD | Source: Ambulatory Visit | Attending: Radiation Oncology | Admitting: Radiation Oncology

## 2019-02-09 ENCOUNTER — Other Ambulatory Visit: Payer: Self-pay

## 2019-02-09 DIAGNOSIS — C7989 Secondary malignant neoplasm of other specified sites: Secondary | ICD-10-CM | POA: Diagnosis not present

## 2019-02-09 LAB — CANCER ANTIGEN 27.29: CA 27.29: 299.7 U/mL — ABNORMAL HIGH (ref 0.0–38.6)

## 2019-02-10 ENCOUNTER — Ambulatory Visit
Admission: RE | Admit: 2019-02-10 | Discharge: 2019-02-10 | Disposition: A | Discharge status: Home / Self Care | Payer: BLUE CROSS/BLUE SHIELD | Source: Ambulatory Visit | Attending: Radiation Oncology | Admitting: Radiation Oncology

## 2019-02-10 ENCOUNTER — Other Ambulatory Visit: Payer: Self-pay

## 2019-02-10 DIAGNOSIS — C7989 Secondary malignant neoplasm of other specified sites: Secondary | ICD-10-CM | POA: Diagnosis not present

## 2019-02-11 ENCOUNTER — Other Ambulatory Visit: Payer: Self-pay

## 2019-02-11 ENCOUNTER — Ambulatory Visit
Admission: RE | Admit: 2019-02-11 | Discharge: 2019-02-11 | Disposition: A | Discharge status: Home / Self Care | Payer: BLUE CROSS/BLUE SHIELD | Source: Ambulatory Visit | Attending: Radiation Oncology | Admitting: Radiation Oncology

## 2019-02-11 DIAGNOSIS — C7989 Secondary malignant neoplasm of other specified sites: Secondary | ICD-10-CM | POA: Diagnosis not present

## 2019-02-12 ENCOUNTER — Other Ambulatory Visit: Payer: Self-pay

## 2019-02-12 ENCOUNTER — Ambulatory Visit
Admission: RE | Admit: 2019-02-12 | Discharge: 2019-02-12 | Disposition: A | Discharge status: Home / Self Care | Payer: BLUE CROSS/BLUE SHIELD | Source: Ambulatory Visit | Attending: Radiation Oncology | Admitting: Radiation Oncology

## 2019-02-12 DIAGNOSIS — C7989 Secondary malignant neoplasm of other specified sites: Secondary | ICD-10-CM | POA: Diagnosis not present

## 2019-02-15 ENCOUNTER — Other Ambulatory Visit: Payer: Self-pay

## 2019-02-15 ENCOUNTER — Ambulatory Visit
Admission: RE | Admit: 2019-02-15 | Discharge: 2019-02-15 | Disposition: A | Discharge status: Home / Self Care | Payer: BLUE CROSS/BLUE SHIELD | Source: Ambulatory Visit | Attending: Radiation Oncology | Admitting: Radiation Oncology

## 2019-02-15 ENCOUNTER — Other Ambulatory Visit: Payer: Self-pay | Admitting: Oncology

## 2019-02-15 DIAGNOSIS — C7989 Secondary malignant neoplasm of other specified sites: Secondary | ICD-10-CM | POA: Diagnosis not present

## 2019-02-16 ENCOUNTER — Other Ambulatory Visit: Payer: Self-pay

## 2019-02-16 ENCOUNTER — Ambulatory Visit
Admission: RE | Admit: 2019-02-16 | Discharge: 2019-02-16 | Disposition: A | Discharge status: Home / Self Care | Payer: BLUE CROSS/BLUE SHIELD | Source: Ambulatory Visit | Attending: Radiation Oncology | Admitting: Radiation Oncology

## 2019-02-16 DIAGNOSIS — C7989 Secondary malignant neoplasm of other specified sites: Secondary | ICD-10-CM | POA: Diagnosis not present

## 2019-02-17 ENCOUNTER — Ambulatory Visit
Admission: RE | Admit: 2019-02-17 | Discharge: 2019-02-17 | Disposition: A | Discharge status: Home / Self Care | Payer: BLUE CROSS/BLUE SHIELD | Source: Ambulatory Visit | Attending: Radiation Oncology | Admitting: Radiation Oncology

## 2019-02-17 ENCOUNTER — Other Ambulatory Visit: Payer: Self-pay

## 2019-02-17 DIAGNOSIS — C7989 Secondary malignant neoplasm of other specified sites: Secondary | ICD-10-CM | POA: Diagnosis not present

## 2019-02-18 ENCOUNTER — Other Ambulatory Visit: Payer: Self-pay

## 2019-02-18 ENCOUNTER — Ambulatory Visit
Admission: RE | Admit: 2019-02-18 | Discharge: 2019-02-18 | Disposition: A | Discharge status: Home / Self Care | Payer: BLUE CROSS/BLUE SHIELD | Source: Ambulatory Visit | Attending: Radiation Oncology | Admitting: Radiation Oncology

## 2019-02-18 DIAGNOSIS — C7989 Secondary malignant neoplasm of other specified sites: Secondary | ICD-10-CM | POA: Diagnosis not present

## 2019-02-19 ENCOUNTER — Ambulatory Visit
Admission: RE | Admit: 2019-02-19 | Discharge: 2019-02-19 | Disposition: A | Discharge status: Home / Self Care | Payer: BLUE CROSS/BLUE SHIELD | Source: Ambulatory Visit | Attending: Radiation Oncology | Admitting: Radiation Oncology

## 2019-02-19 ENCOUNTER — Other Ambulatory Visit: Payer: Self-pay

## 2019-02-19 DIAGNOSIS — Z79899 Other long term (current) drug therapy: Secondary | ICD-10-CM | POA: Insufficient documentation

## 2019-02-19 DIAGNOSIS — Z9049 Acquired absence of other specified parts of digestive tract: Secondary | ICD-10-CM | POA: Insufficient documentation

## 2019-02-19 DIAGNOSIS — Z809 Family history of malignant neoplasm, unspecified: Secondary | ICD-10-CM | POA: Diagnosis not present

## 2019-02-19 DIAGNOSIS — Z9013 Acquired absence of bilateral breasts and nipples: Secondary | ICD-10-CM | POA: Diagnosis not present

## 2019-02-19 DIAGNOSIS — Z87891 Personal history of nicotine dependence: Secondary | ICD-10-CM | POA: Diagnosis not present

## 2019-02-19 DIAGNOSIS — Z171 Estrogen receptor negative status [ER-]: Secondary | ICD-10-CM | POA: Insufficient documentation

## 2019-02-19 DIAGNOSIS — F419 Anxiety disorder, unspecified: Secondary | ICD-10-CM | POA: Insufficient documentation

## 2019-02-19 DIAGNOSIS — F329 Major depressive disorder, single episode, unspecified: Secondary | ICD-10-CM | POA: Insufficient documentation

## 2019-02-19 DIAGNOSIS — Z170 Estrogen receptor positive status [ER+]: Secondary | ICD-10-CM | POA: Diagnosis not present

## 2019-02-19 DIAGNOSIS — C50212 Malignant neoplasm of upper-inner quadrant of left female breast: Secondary | ICD-10-CM | POA: Diagnosis not present

## 2019-02-19 DIAGNOSIS — Z923 Personal history of irradiation: Secondary | ICD-10-CM | POA: Insufficient documentation

## 2019-02-19 DIAGNOSIS — C7989 Secondary malignant neoplasm of other specified sites: Secondary | ICD-10-CM | POA: Diagnosis present

## 2019-02-22 ENCOUNTER — Other Ambulatory Visit: Payer: Self-pay

## 2019-02-22 ENCOUNTER — Ambulatory Visit
Admission: RE | Admit: 2019-02-22 | Discharge: 2019-02-22 | Disposition: A | Discharge status: Home / Self Care | Payer: BLUE CROSS/BLUE SHIELD | Source: Ambulatory Visit | Attending: Radiation Oncology | Admitting: Radiation Oncology

## 2019-02-22 DIAGNOSIS — C7989 Secondary malignant neoplasm of other specified sites: Secondary | ICD-10-CM | POA: Diagnosis not present

## 2019-02-23 ENCOUNTER — Other Ambulatory Visit: Payer: Self-pay

## 2019-02-23 ENCOUNTER — Ambulatory Visit
Admission: RE | Admit: 2019-02-23 | Discharge: 2019-02-23 | Disposition: A | Discharge status: Home / Self Care | Payer: BLUE CROSS/BLUE SHIELD | Source: Ambulatory Visit | Attending: Radiation Oncology | Admitting: Radiation Oncology

## 2019-02-23 DIAGNOSIS — C7989 Secondary malignant neoplasm of other specified sites: Secondary | ICD-10-CM | POA: Diagnosis not present

## 2019-02-24 ENCOUNTER — Other Ambulatory Visit: Payer: Self-pay

## 2019-02-24 ENCOUNTER — Ambulatory Visit
Admission: RE | Admit: 2019-02-24 | Discharge: 2019-02-24 | Disposition: A | Discharge status: Home / Self Care | Payer: BLUE CROSS/BLUE SHIELD | Source: Ambulatory Visit | Attending: Radiation Oncology | Admitting: Radiation Oncology

## 2019-02-24 DIAGNOSIS — C7989 Secondary malignant neoplasm of other specified sites: Secondary | ICD-10-CM | POA: Diagnosis not present

## 2019-02-25 ENCOUNTER — Other Ambulatory Visit: Payer: Self-pay

## 2019-02-25 ENCOUNTER — Ambulatory Visit
Admission: RE | Admit: 2019-02-25 | Discharge: 2019-02-25 | Disposition: A | Discharge status: Home / Self Care | Payer: BLUE CROSS/BLUE SHIELD | Source: Ambulatory Visit | Attending: Radiation Oncology | Admitting: Radiation Oncology

## 2019-02-25 DIAGNOSIS — C7989 Secondary malignant neoplasm of other specified sites: Secondary | ICD-10-CM | POA: Diagnosis not present

## 2019-02-25 MED FILL — CAPECITABINE 500 MG TABS: 500 | 30 days supply | Qty: 90 | Fill #1

## 2019-02-25 MED FILL — TOREMIFENE CITRATE 60 MG TA: 60 | 30 days supply | Qty: 30 | Fill #2

## 2019-02-26 ENCOUNTER — Other Ambulatory Visit: Payer: Self-pay

## 2019-02-26 ENCOUNTER — Telehealth: Payer: Self-pay | Admitting: Oncology

## 2019-02-26 ENCOUNTER — Telehealth: Payer: Self-pay | Admitting: *Deleted

## 2019-02-26 ENCOUNTER — Ambulatory Visit
Admission: RE | Admit: 2019-02-26 | Discharge: 2019-02-26 | Disposition: A | Discharge status: Home / Self Care | Payer: BLUE CROSS/BLUE SHIELD | Source: Ambulatory Visit | Attending: Radiation Oncology | Admitting: Radiation Oncology

## 2019-02-26 DIAGNOSIS — C7989 Secondary malignant neoplasm of other specified sites: Secondary | ICD-10-CM | POA: Diagnosis not present

## 2019-02-26 NOTE — Telephone Encounter (Signed)
Rescheduled 5/18 appt per sch msg. Called and left message for patient informing of changes

## 2019-02-28 ENCOUNTER — Other Ambulatory Visit: Payer: Self-pay | Admitting: Oncology

## 2019-03-01 ENCOUNTER — Other Ambulatory Visit: Payer: Self-pay

## 2019-03-01 ENCOUNTER — Ambulatory Visit
Admission: RE | Admit: 2019-03-01 | Discharge: 2019-03-01 | Disposition: A | Discharge status: Home / Self Care | Payer: BLUE CROSS/BLUE SHIELD | Source: Ambulatory Visit | Attending: Radiation Oncology | Admitting: Radiation Oncology

## 2019-03-01 DIAGNOSIS — C7989 Secondary malignant neoplasm of other specified sites: Secondary | ICD-10-CM | POA: Diagnosis not present

## 2019-03-02 ENCOUNTER — Other Ambulatory Visit: Payer: Self-pay

## 2019-03-02 ENCOUNTER — Ambulatory Visit
Admission: RE | Admit: 2019-03-02 | Discharge: 2019-03-02 | Disposition: A | Discharge status: Home / Self Care | Payer: BLUE CROSS/BLUE SHIELD | Source: Ambulatory Visit | Attending: Radiation Oncology | Admitting: Radiation Oncology

## 2019-03-02 ENCOUNTER — Ambulatory Visit (HOSPITAL_COMMUNITY)
Admission: RE | Admit: 2019-03-02 | Discharge: 2019-03-02 | Disposition: A | Discharge status: Home / Self Care | Payer: BLUE CROSS/BLUE SHIELD | Source: Ambulatory Visit | Attending: Oncology | Admitting: Oncology

## 2019-03-02 ENCOUNTER — Ambulatory Visit: Payer: BLUE CROSS/BLUE SHIELD | Admitting: Radiation Oncology

## 2019-03-02 DIAGNOSIS — C7951 Secondary malignant neoplasm of bone: Secondary | ICD-10-CM

## 2019-03-02 DIAGNOSIS — D701 Agranulocytosis secondary to cancer chemotherapy: Secondary | ICD-10-CM | POA: Diagnosis not present

## 2019-03-02 DIAGNOSIS — G620 Drug-induced polyneuropathy: Secondary | ICD-10-CM | POA: Insufficient documentation

## 2019-03-02 DIAGNOSIS — C50912 Malignant neoplasm of unspecified site of left female breast: Secondary | ICD-10-CM

## 2019-03-02 DIAGNOSIS — T451X5A Adverse effect of antineoplastic and immunosuppressive drugs, initial encounter: Secondary | ICD-10-CM | POA: Diagnosis not present

## 2019-03-02 DIAGNOSIS — C7989 Secondary malignant neoplasm of other specified sites: Secondary | ICD-10-CM

## 2019-03-02 DIAGNOSIS — Z08 Encounter for follow-up examination after completed treatment for malignant neoplasm: Secondary | ICD-10-CM | POA: Diagnosis not present

## 2019-03-02 DIAGNOSIS — C50212 Malignant neoplasm of upper-inner quadrant of left female breast: Secondary | ICD-10-CM | POA: Diagnosis not present

## 2019-03-02 DIAGNOSIS — Z170 Estrogen receptor positive status [ER+]: Secondary | ICD-10-CM | POA: Diagnosis not present

## 2019-03-02 DIAGNOSIS — Z87891 Personal history of nicotine dependence: Secondary | ICD-10-CM | POA: Diagnosis not present

## 2019-03-02 MED ORDER — PERFLUTREN LIPID MICROSPHERE
1.0000 mL | INTRAVENOUS | Status: DC | PRN
  Administered 2019-03-02: 10:00:00 3 mL via INTRAVENOUS
  Filled 2019-03-02: qty 10

## 2019-03-02 MED ORDER — RADIAPLEXRX EX GEL
Freq: Once | CUTANEOUS | Status: AC
  Administered 2019-03-02: 16:00:00 via TOPICAL

## 2019-03-02 NOTE — Progress Notes (Signed)
  Echocardiogram 2D Echocardiogram with definity has been performed.  [name] M 03/02/2019, 10:03 AM

## 2019-03-03 ENCOUNTER — Ambulatory Visit
Admission: RE | Admit: 2019-03-03 | Discharge: 2019-03-03 | Disposition: A | Discharge status: Home / Self Care | Payer: BLUE CROSS/BLUE SHIELD | Source: Ambulatory Visit | Attending: Radiation Oncology | Admitting: Radiation Oncology

## 2019-03-03 ENCOUNTER — Other Ambulatory Visit: Payer: Self-pay

## 2019-03-03 DIAGNOSIS — C7989 Secondary malignant neoplasm of other specified sites: Secondary | ICD-10-CM | POA: Diagnosis not present

## 2019-03-04 ENCOUNTER — Other Ambulatory Visit: Payer: Self-pay

## 2019-03-04 ENCOUNTER — Ambulatory Visit
Admission: RE | Admit: 2019-03-04 | Discharge: 2019-03-04 | Disposition: A | Discharge status: Home / Self Care | Payer: BLUE CROSS/BLUE SHIELD | Source: Ambulatory Visit | Attending: Radiation Oncology | Admitting: Radiation Oncology

## 2019-03-04 DIAGNOSIS — C7989 Secondary malignant neoplasm of other specified sites: Secondary | ICD-10-CM | POA: Diagnosis not present

## 2019-03-04 NOTE — Progress Notes (Signed)
[place]  [phone]) [phone] [phone]) [phone]    ID: [name] DOB: 03-Sep-1956  MR#: [id number]  OEH#:[id number]  Patient Care Team: Chesley Noon, MD as PCP - General (Family Medicine) Excell Seltzer, MD as Consulting Physician (General Surgery) Ireta Pullman, [name], MD as Consulting Physician (Oncology) Gery Pray, MD as Consulting Physician (Radiation Oncology) Collene Gobble, MD as Consulting Physician (Pulmonary Disease) Verdell Carmine, MD as Referring Physician (Internal Medicine) Larey Dresser, MD as Consulting Physician (Cardiology) OTHER MD: Dr Lyda Jester [phone])   CHIEF COMPLAINT: estrogen receptor positive, HER-2 positive breast cancer; 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   She continues to receive palliative IMRT treatments to the right chest wall area, and she will receive 30 treatments in total. She has completed 21 treatments. She notes that she is starting to burn some with light peeling.   She is receiving capecitabine for sensitization. She tolerates this well without any diarrhea, rash, or other side effects.    She continues on toremifene. She tolerates this well and without any significant hot flashes or other concerns.  She also continues on trastuzumab, given every 28 days, with her most recent dose on 02/08/2019. She has occasional hot flashes. She does have some mild fatigue, which she will take a nap for.    [name]'s most recent echocardiogram on 03/02/2019, showed an ejection fraction in the 55% - 60% range.   In addition, she continues on zoledronate, with her most recent dose on 12/14/2018. We will give her a dose today. She tolerates this  well and without any bone discomfort or other side effects.  She is concerned as the upward trend in her CA-27-29.  Results for [name] (MRN [id number]) as of 03/05/2019 12:18  Ref. Range 10/26/2018 08:20 11/16/2018 11:56 12/14/2018 08:46 01/11/2019 12:28 02/08/2019 09:00  CA 27.29 Latest Ref Range: 0.0 - 38.6 U/mL 130.9 (H) 138.9 (H) 203.1 (H) 234.1 (H) 299.7 (H)      REVIEW OF SYSTEMS: [name] spends a lot of time with her grandchild. She walks about a mile every day. She notes that she does have some "blue days;" which she is treating with xanax. Physically, she feels good in the mornings, and she tends to drop off in the afternoon. But emotionally, she feels down sometimes, which she says comes in waves. She tries to get to herself, but she is ready to get back into her small groups for prayer. [name] notes that she fell recently when she went to use the bathroom in the middle of the night; she says she lost her balance due to neuropathy in her feet. She has had no headaches.   The patient denies unusual headaches, visual changes, nausea, vomiting, or dizziness. There has been no unusual cough, phlegm production, or pleurisy. This been no change in bowel or bladder habits. The patient denies unexplained weight loss, bleeding, rash, or fever. A detailed review of systems was otherwise noncontributory.    BREAST CANCER HISTORY: From the original intake note:  "[name]" had a fall while traveling living to [place] in their RV and injure her left upper arm and  her left breast . Approximately 3 weeks later she noted a mass in the left breast upper inner quadrant. It was somewhat tender. She brought this to medical  attention and on 09/27/2016 underwent bilateral diagnostic mammography with tomography [facility] The breast density was category B. The right breast was benign. In the left breast upper inner quadrant there was a large lobulated hyperdense mass  which by exam was firm and nonmobile. By ultrasonography in the 11:30 o'clock radius 4 cm from the nipple there was a mass extending to the subcutaneous tissues and measuring 4.9 cm. There was a small superficial fluid collection overlying this consistent with a hematoma. The left axilla was benign.  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  The patient's subsequent history is as detailed below   PAST MEDICAL HISTORY: Past Medical History:  Diagnosis Date  . Anxiety   . CAP (community acquired pneumonia)   . Depression   . Malignant neoplasm of upper-inner quadrant of left female breast ([facility]) 10/01/2016  . Personal history of chemotherapy   . Tobacco use     PAST SURGICAL HISTORY: Past Surgical History:  Procedure Laterality Date  . ABDOMINAL HYSTERECTOMY  2011   Laparoscopic  . AUGMENTATION MAMMAPLASTY Bilateral 03/2017  . BREAST LUMPECTOMY Left 04/05/2017  . BREAST LUMPECTOMY WITH SENTINEL LYMPH NODE BIOPSY Left 04/07/2017  . BREAST REDUCTION SURGERY Left 04/07/2017  . CHOLECYSTECTOMY    . MASTECTOMY Bilateral 08/25/2017   at [facility]  . open reduction left ankle    . SKIN GRAFT Left 06/19/2017   bil skin grafts to breasts    FAMILY HISTORY: Family History  Problem Relation Age of Onset  . Heart disease Mother   . Heart disease Father   . Cancer Sister        ovarian ca   The patient's father died at the age of 2 from a myocardial infarction. The patient's mother died at the age of 67 with congestive heart failure. The patient had one brother, 2 sisters. One of her sisters was diagnosed with ovarian cancer at age 63 and died at age 71 from that disease   GYNECOLOGIC HISTORY:  No LMP  recorded. Patient has had a hysterectomy. Menarche age 17, first live birth age 49. The patient is GX P1. She stopped having periods approximately 2008. She did not take hormone replacement. She used oral contraceptives for approximately 30 years with no complications.   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 Her husband [name] is retired from [employer]. Their daughter [name], used to live in [place], but has moved back to [place]. [name]'s husband works in Engineer, technical sales for healthcare systems. The patient welcomed her 1st grandchild, a boy named Mcchristian in May 2019.       ADVANCED DIRECTIVES: not in place   HEALTH MAINTENANCE: Social History   Tobacco Use  . Smoking status: Former Smoker    Packs/day: 0.50    Years: 20.00    Pack years: 10.00    Last attempt to quit: 10/21/2016    Years since quitting: 2.3  . Smokeless tobacco: Never Used  Substance Use Topics  . Alcohol use: No  . Drug use: No     Colonoscopy:  PAP:  Bone density:   Allergies  Allergen Reactions  . Propofol Other (See Comments)  Vivid dreams (HAD THIS IN ICU WHEN SHE HAD DOUBLE PNEUMONIA.) Vivid dreams (HAD THIS IN ICU WHEN SHE HAD DOUBLE PNEUMONIA, with CWCBJS)2831 March; states, "Anesthesiologist said that only happened since I was on it for a prolonged time but I'd be good for use in surgery. Vivid dreams (HAD THIS IN ICU WHEN SHE HAD DOUBLE PNEUMONIA, with DVVOHY)0737 March; states, "Anesthesiologist said that only happened since I was on it for a prolonged time but I'd be good for use in surgery. Vivid dreams (HAD THIS IN ICU WHEN SHE HAD DOUBLE PNEUMONIA.)     Current Outpatient Medications  Medication Sig Dispense Refill  . acetaminophen (TYLENOL) 325 MG tablet Take 650 mg by mouth every 6 (six) hours as needed for moderate pain.     Marland Kitchen ALPRAZolam (XANAX XR) 1 MG 24 hr tablet TAKE 1 TABLET BY MOUTH EVERY DAY 30 tablet 1  .  busPIRone (BUSPAR) 15 MG tablet TAKE 1 TABLET (15 MG TOTAL) BY MOUTH 3 (THREE) TIMES DAILY. 270 tablet 0  . busPIRone (BUSPAR) 30 MG tablet Take 1 tablet (30 mg total) by mouth 3 (three) times daily. 90 tablet 4  . capecitabine (XELODA) 500 MG tablet Take 2 tablets (1,000 mg total) by mouth 2 (two) times daily after a meal. Take on days of radiation only, M-F 90 tablet 1  . diphenhydrAMINE (BENADRYL) 25 MG tablet Take 50 mg by mouth as needed for sleep.     Marland Kitchen ibuprofen (ADVIL,MOTRIN) 200 MG tablet Take 600 mg by mouth every 6 (six) hours as needed for moderate pain.     . magic mouthwash w/lidocaine SOLN Take 15 mLs by mouth 3 (three) times daily as needed for mouth pain. (Patient not taking: Reported on 01/21/2019) 240 mL 0  . omeprazole (PRILOSEC) 40 MG capsule Take 1 capsule (40 mg total) by mouth daily. 60 capsule 2  . ondansetron (ZOFRAN) 8 MG tablet Take 1 tablet (8 mg total) by mouth 2 (two) times daily as needed for refractory nausea / vomiting. Start on day 3 after chemotherapy. 30 tablet 1  . PARoxetine (PAXIL) 40 MG tablet TAKE 1 TABLET BY MOUTH EVERY DAY IN THE MORNING 90 tablet 3  . prochlorperazine (COMPAZINE) 10 MG tablet Take 1 tablet (10 mg total) by mouth every 6 (six) hours as needed (Nausea or vomiting). 30 tablet 1  . tiZANidine (ZANAFLEX) 4 MG tablet TAKE 1 TABLET BY MOUTH EVERYDAY AT BEDTIME 30 tablet 1  . Toremifene Citrate (FARESTON) 60 MG tablet Take by mouth.    . valACYclovir (VALTREX) 1000 MG tablet Take 1 tablet (1,000 mg total) by mouth daily. 90 tablet 2   No current facility-administered medications for this visit.    Facility-Administered Medications Ordered in Other Visits  Medication Dose Route Frequency Provider Last Rate Last Dose  . sodium chloride flush (NS) 0.9 % injection 10 mL  10 mL Intracatheter Once Robertt Buda, [name], MD         OBJECTIVE: Middle-aged white woman in no acute distress  Vitals:   03/05/19 1135  BP: 121/76  Resp: 18  Temp: 98.9 F  (37.2 C)  SpO2: 99%     Body mass index is 29.83 kg/m.   Filed Weights   03/05/19 1135  Weight: 202 lb (91.6 kg)     ECOG FS:1 - Symptomatic but completely ambulatory   Sclerae unicteric, pupils round and equal Masked No cervical or supraclavicular adenopathy Lungs no rales or rhonchi Heart regular rate and rhythm Abd soft, nontender,  positive bowel sounds MSK no focal spinal tenderness, no upper extremity lymphedema Neuro: nonfocal, well oriented, appropriate affect Breasts: Status post bilateral mastectomies; currently I do not see active lesions on the anterior left chest wall.  On the right side several of the lesions being treated have resolved.  There are still 3 small lesions barely visible in the imaged below, clustered in the chest wall depression to the left of the port site and this image   Chest wall 03/05/2019, right side on top     Photo 01/11/2019      LAB RESULTS:  CMP     Component Value Date/Time   NA 139 03/05/2019 1115   NA 140 10/24/2017 1307   K 3.8 03/05/2019 1115   K 5.4 (H) 10/24/2017 1307   CL 106 03/05/2019 1115   CO2 24 03/05/2019 1115   CO2 29 10/24/2017 1307   GLUCOSE 163 (H) 03/05/2019 1115   GLUCOSE 103 10/24/2017 1307   BUN 17 03/05/2019 1115   BUN 18.2 10/24/2017 1307   CREATININE 0.83 03/05/2019 1115   CREATININE 0.80 02/08/2019 0900   CREATININE 0.8 10/24/2017 1307   CALCIUM 8.8 (L) 03/05/2019 1115   CALCIUM 10.3 10/24/2017 1307   PROT 6.9 03/05/2019 1115   PROT 7.5 10/24/2017 1307   ALBUMIN 3.4 (L) 03/05/2019 1115   ALBUMIN 4.0 10/24/2017 1307   AST 18 03/05/2019 1115   AST 31 02/08/2019 0900   AST 9 10/24/2017 1307   ALT 10 03/05/2019 1115   ALT 8 02/08/2019 0900   ALT 8 10/24/2017 1307   ALKPHOS 112 03/05/2019 1115   ALKPHOS 115 10/24/2017 1307   BILITOT 0.2 (L) 03/05/2019 1115   BILITOT 0.3 02/08/2019 0900   BILITOT 0.28 10/24/2017 1307   GFRNONAA >60 03/05/2019 1115   GFRNONAA >60 02/08/2019 0900   GFRAA  >60 03/05/2019 1115   GFRAA >60 02/08/2019 0900    INo results found for: SPEP, UPEP  Lab Results  Component Value Date   WBC 5.3 03/05/2019   NEUTROABS 4.3 03/05/2019   HGB 10.8 (L) 03/05/2019   HCT 36.4 03/05/2019   MCV 87.5 03/05/2019   PLT 259 03/05/2019      Chemistry      Component Value Date/Time   NA 139 03/05/2019 1115   NA 140 10/24/2017 1307   K 3.8 03/05/2019 1115   K 5.4 (H) 10/24/2017 1307   CL 106 03/05/2019 1115   CO2 24 03/05/2019 1115   CO2 29 10/24/2017 1307   BUN 17 03/05/2019 1115   BUN 18.2 10/24/2017 1307   CREATININE 0.83 03/05/2019 1115   CREATININE 0.80 02/08/2019 0900   CREATININE 0.8 10/24/2017 1307      Component Value Date/Time   CALCIUM 8.8 (L) 03/05/2019 1115   CALCIUM 10.3 10/24/2017 1307   ALKPHOS 112 03/05/2019 1115   ALKPHOS 115 10/24/2017 1307   AST 18 03/05/2019 1115   AST 31 02/08/2019 0900   AST 9 10/24/2017 1307   ALT 10 03/05/2019 1115   ALT 8 02/08/2019 0900   ALT 8 10/24/2017 1307   BILITOT 0.2 (L) 03/05/2019 1115   BILITOT 0.3 02/08/2019 0900   BILITOT 0.28 10/24/2017 1307       No results found for: LABCA2  No components found for: LABCA125  No results for input(s): INR in the last 168 hours.  Urinalysis    Component Value Date/Time   COLORURINE STRAW (A) 10/30/2017 2050   APPEARANCEUR CLEAR 10/30/2017 2050   LABSPEC 1.006  10/30/2017 2050   [place] 6.0 10/30/2017 2050   GLUCOSEU NEGATIVE 10/30/2017 2050   [place] NEGATIVE 10/30/2017 2050   [place] NEGATIVE 10/30/2017 2050   [place] 10/30/2017 2050   PROTEINUR NEGATIVE 10/30/2017 2050   NITRITE NEGATIVE 10/30/2017 2050   LEUKOCYTESUR NEGATIVE 10/30/2017 2050     STUDIES: No results found.  ELIGIBLE FOR AVAILABLE RESEARCH PROTOCOL: no   ASSESSMENT: 63 y.o. [employer], [place] woman status post left breast upper inner quadrant biopsy 09/30/2016 for a clinical T2 N0, stage II a invasive ductal carcinoma, grade 3, estrogen receptor  positive, progesterone receptor negative, HER-2 not amplified, with an MIB-1 of 80%.  (1) genetics testing not actionable (see below).  (2) neoadjuvant chemotherapy given 10/30/2016 to 03/12/2017 consisting of cyclophosphamide and doxorubicin in dose dense fashion 4  followed by paclitaxel 1, then Abraxane (x8?)  further treatments discontinued because of neuropathy.  (3) status post left lumpectomy with oncoplasty and right reduction mammoplasty showing a residual T2 N1 invasive ductal carcinoma, with lymphovascular invasion, but negative margins; the tumor is now HER-2 positive.  (a) postop PET scan showed no evidence of metastatic disease.  (4) adjuvant trastuzumab/pertuzumab started 06/20/2017 (received at [facility]), started here on 07/11/2017--continued through April 2019 when patient left for [place]  (a) Echo on 06/18/2017 at [facility]: LVEF 55-60%  (b) echocardiogram 09/26/2017 shows an ejection fraction in the 60-65% range.  (c) echocardiogram 12/26/2017 shows an ejection fraction in the 55-60% range  (d) see below for subsequent echocardiogram results   (5) adjuvant radiation was to follow, but see (7) below  (6) anti-estrogens: Letrozole started post-op, discontinued with progression OCT 2018  (a) DEXA on 04/03/2017 demonstrated a T score of -1.3 in the right femoral neck  (b) Zometa given at [facility] on 06/20/2017  RECURRENCE: (7) Patient developed a left breast nodule and underwent mammogram and ultrasound on 08/06/17 showing three areas of concern at 10 o'clock, 1130 o'clock, and at 8 o'clock. Biopsy on 08/07/2017 demonstrated at 11:30 fat necrosis, however at 10:00 it was positive for IDC, grade 3, ER+(50%), PR-(0%), Ki-67 90%, HER-2 negative (ratio 1.48).  (a) staging chest CT 08/15/2017 shows 0.5 cm left lower lobe lung nodule of uncertain significance  (8) status post left modified radical mastectomy and right simple mastectomy 08/25/2017 showing  (a) right breast, no evidence of  malignancy  (b) left breast, ypT2 ypN1 invasive ductal carcinoma, grade 3, with negative margins; repeat prognostic panel: Triple negative  (9) started adjuvant chemotherapy with cyclophosphamide/methotrexate/fluorouracil [CMF] 11/10/2016, eight cycles planned  (a) continuing trastuzumab/pertuzumab   (b) CMF chemotherapy discontinued after 4 cycles with disease progression  (10) status post left chest wall biopsy for further local recurrence 01/05/2018, the tumor being now weakly estrogen receptor positive, progesterone receptor negative, and again HER-2 negative  (11) PET scan 03/25/2018 negative except for a 1.2 cm left internal mammary lymph node, SUV 6.9  (12) adjuvant radiation completed 03/19/2018 (30 doses to left chest wall area under Dr Modesto Charon at [facility]  (a) did not receive capecitabine sensitization  (13)  Letrozole resumed 03/27/2018--discontinued 06/29/2018  (14) foundation one 01/09/2018 shows no actionable mutations, with stable microsatellite status and low mutational burden.  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 (a) PD-L1 testing on immune cells, 0% staining (negative)--obtained on 08/07/2017 cycle, which was weakly ER positive, progesterone and HER-2 negative (b) genetics testing through [facility] October 31, 2016 found no deleterious mutations in ATM, BRCA1, BRCA2, BRI P1, CDH 1, CH EK 2, EPCAM, MLH1, MSH2,  MSH6, NBN, NF1, PALB 2, PMS2, PTEN, RAD 51C, RAD 51D, STK 1 1, or TP 53.  (15) trastuzumab and pertuzumab resumed 03/31/2018 (a) echocardiogram on 04/20/2018 showed an ejection fraction in the 50-55% range.  (b) changed to every 4-week treatments beginning 07/01/2018 to coordinate with fulvestrant (c) echocardiogram 08/17/2018 shows a well-preserved ejection fraction. (d) pertuzumab discontinued as of December 2019  (16) fine-needle aspiration of a chest wall lesion on 06/04/2018 was read as "non-small cell carcinoma".  Punch biopsy obtained at the visit  06/08/2018 for prognostic panel determination showed poorly differentiated carcinoma, estrogen receptor positive at 60%, progesterone receptor negative, HER-2 negative by immunohistochemistry at 1+  (17) fulvestrant started 07/01/2018, repeat every 28 days  (a) mid September started palbociclib 125 mg daily, 21/7,   (b) discontinued with evidence of mixed response  (18) carboplatin/gemcitabine days 1 and 8 started 09/29/2018, repeated every 21 days (a) baseline PET scan 09/23/2018 shows progression on prior treatment, measurable disease (b) CA-27-29 is now informative   (c) cycle 2 of carboplatin/gemcitabine delayed 1 week  (d) carboplatin/gemcitabine discontinued after 3 cycles with evidence of disease progression  (19) PET scan 12/10/2018 shows progression in chest wall and sternum, stable disease in nodes and other blytic bone lesions  (20) Fareston/toremifene started 12/28/2018  (21) palliative radiation to the right chest wall scjheduled for 02/11/2019 - 03/18/2019  (a) will receive capecitabine radiosensitization   PLAN:  [name] is doing quite well with her current radiation treatments and they seem to be effective.  She is on capecitabine sensitization with no side effects that she is aware of.  We are continuing trastuzumab every 28 days for her HER-2 positive tumor and Sylvester Harder for her estrogen receptor positive tumor.  She has had a variety of chemotherapy agents in the past for the triple negative which have not been very helpful and she has expressed an intention to not receive any further chemotherapy.  However she is tolerating capecitabine quite well and she might be agreeable to capecitabine alone if we do have disease progression from the triple negative disease.  She has not had exemestane/everolimus and that also is a possibility which was mentioned by Dr. Murvin Donning  Right now I would just continue what we are doing.  She will see [place] again 04/02/2019, and then I will see  her again 05/03/2019.  We discussed the rising CA-27-29.  I would go by the chest wall exam but if she wishes to get a CT of the chest at some point of course we could easily do that.  At this point I do not think it would affect treatment decisions.  She knows to call for any other issues that may develop before the next visit.   Doralee Kocak, [name], MD  03/05/19 12:10 PM Medical Oncology and Hematology [facility] [address] [place], [place] [address] Tel. [phone]    Fax. [phone]  I, Jacqualyn Posey am acting as a Education administrator for Chauncey Cruel, MD.   I, Lurline Del MD, have reviewed the above documentation for accuracy and completeness, and I agree with the above.

## 2019-03-05 ENCOUNTER — Ambulatory Visit
Admission: RE | Admit: 2019-03-05 | Discharge: 2019-03-05 | Disposition: A | Discharge status: Home / Self Care | Payer: BLUE CROSS/BLUE SHIELD | Source: Ambulatory Visit | Attending: Radiation Oncology | Admitting: Radiation Oncology

## 2019-03-05 ENCOUNTER — Inpatient Hospital Stay: Payer: BLUE CROSS/BLUE SHIELD

## 2019-03-05 ENCOUNTER — Inpatient Hospital Stay (HOSPITAL_BASED_OUTPATIENT_CLINIC_OR_DEPARTMENT_OTHER): Payer: BLUE CROSS/BLUE SHIELD | Admitting: Oncology

## 2019-03-05 ENCOUNTER — Other Ambulatory Visit: Payer: Self-pay

## 2019-03-05 VITALS — HR 97

## 2019-03-05 VITALS — BP 121/76 | Temp 98.9°F | Resp 18 | Ht 69.0 in | Wt 202.0 lb

## 2019-03-05 DIAGNOSIS — Z87891 Personal history of nicotine dependence: Secondary | ICD-10-CM | POA: Insufficient documentation

## 2019-03-05 DIAGNOSIS — Z923 Personal history of irradiation: Secondary | ICD-10-CM | POA: Insufficient documentation

## 2019-03-05 DIAGNOSIS — C50212 Malignant neoplasm of upper-inner quadrant of left female breast: Secondary | ICD-10-CM

## 2019-03-05 DIAGNOSIS — C50912 Malignant neoplasm of unspecified site of left female breast: Secondary | ICD-10-CM

## 2019-03-05 DIAGNOSIS — G620 Drug-induced polyneuropathy: Secondary | ICD-10-CM | POA: Insufficient documentation

## 2019-03-05 DIAGNOSIS — Z79810 Long term (current) use of selective estrogen receptor modulators (SERMs): Secondary | ICD-10-CM

## 2019-03-05 DIAGNOSIS — Z90710 Acquired absence of both cervix and uterus: Secondary | ICD-10-CM | POA: Insufficient documentation

## 2019-03-05 DIAGNOSIS — T451X5A Adverse effect of antineoplastic and immunosuppressive drugs, initial encounter: Secondary | ICD-10-CM

## 2019-03-05 DIAGNOSIS — Z9221 Personal history of antineoplastic chemotherapy: Secondary | ICD-10-CM | POA: Insufficient documentation

## 2019-03-05 DIAGNOSIS — Z170 Estrogen receptor positive status [ER+]: Secondary | ICD-10-CM

## 2019-03-05 DIAGNOSIS — Z8249 Family history of ischemic heart disease and other diseases of the circulatory system: Secondary | ICD-10-CM | POA: Insufficient documentation

## 2019-03-05 DIAGNOSIS — Z95828 Presence of other vascular implants and grafts: Secondary | ICD-10-CM

## 2019-03-05 DIAGNOSIS — C7951 Secondary malignant neoplasm of bone: Secondary | ICD-10-CM

## 2019-03-05 DIAGNOSIS — Z9013 Acquired absence of bilateral breasts and nipples: Secondary | ICD-10-CM

## 2019-03-05 DIAGNOSIS — C771 Secondary and unspecified malignant neoplasm of intrathoracic lymph nodes: Secondary | ICD-10-CM

## 2019-03-05 DIAGNOSIS — C7989 Secondary malignant neoplasm of other specified sites: Secondary | ICD-10-CM

## 2019-03-05 DIAGNOSIS — Z9181 History of falling: Secondary | ICD-10-CM

## 2019-03-05 DIAGNOSIS — R978 Other abnormal tumor markers: Secondary | ICD-10-CM

## 2019-03-05 DIAGNOSIS — Z79899 Other long term (current) drug therapy: Secondary | ICD-10-CM | POA: Insufficient documentation

## 2019-03-05 DIAGNOSIS — C50812 Malignant neoplasm of overlapping sites of left female breast: Secondary | ICD-10-CM

## 2019-03-05 DIAGNOSIS — Z5112 Encounter for antineoplastic immunotherapy: Secondary | ICD-10-CM | POA: Insufficient documentation

## 2019-03-05 DIAGNOSIS — Z8041 Family history of malignant neoplasm of ovary: Secondary | ICD-10-CM | POA: Insufficient documentation

## 2019-03-05 LAB — CBC WITH DIFFERENTIAL/PLATELET
Abs Immature Granulocytes: 0.02 10*3/uL (ref 0.00–0.07)
Basophils Absolute: 0 10*3/uL (ref 0.0–0.1)
Basophils Relative: 1 %
Eosinophils Absolute: 0.3 10*3/uL (ref 0.0–0.5)
Eosinophils Relative: 6 %
HCT: 36.4 % (ref 36.0–46.0)
Hemoglobin: 10.8 g/dL — ABNORMAL LOW (ref 12.0–15.0)
Immature Granulocytes: 0 %
Lymphocytes Relative: 4 %
Lymphs Abs: 0.2 10*3/uL — ABNORMAL LOW (ref 0.7–4.0)
MCH: 26 pg (ref 26.0–34.0)
MCHC: 29.7 g/dL — ABNORMAL LOW (ref 30.0–36.0)
MCV: 87.5 fL (ref 80.0–100.0)
Monocytes Absolute: 0.5 10*3/uL (ref 0.1–1.0)
Monocytes Relative: 9 %
Neutro Abs: 4.3 10*3/uL (ref 1.7–7.7)
Neutrophils Relative %: 80 %
Platelets: 259 10*3/uL (ref 150–400)
RBC: 4.16 MIL/uL (ref 3.87–5.11)
RDW: 16.6 % — ABNORMAL HIGH (ref 11.5–15.5)
WBC: 5.3 10*3/uL (ref 4.0–10.5)
nRBC: 0 % (ref 0.0–0.2)

## 2019-03-05 LAB — COMPREHENSIVE METABOLIC PANEL
ALT: 10 U/L (ref 0–44)
AST: 18 U/L (ref 15–41)
Albumin: 3.4 g/dL — ABNORMAL LOW (ref 3.5–5.0)
Alkaline Phosphatase: 112 U/L (ref 38–126)
Anion gap: 9 (ref 5–15)
BUN: 17 mg/dL (ref 8–23)
CO2: 24 mmol/L (ref 22–32)
Calcium: 8.8 mg/dL — ABNORMAL LOW (ref 8.9–10.3)
Chloride: 106 mmol/L (ref 98–111)
Creatinine, Ser: 0.83 mg/dL (ref 0.44–1.00)
GFR calc Af Amer: >60 mL/min (ref >60)
GFR calc non Af Amer: >60 mL/min (ref >60)
Glucose, Bld: 163 mg/dL — ABNORMAL HIGH (ref 70–99)
Potassium: 3.8 mmol/L (ref 3.5–5.1)
Sodium: 139 mmol/L (ref 135–145)
Total Bilirubin: 0.2 mg/dL — ABNORMAL LOW (ref 0.3–1.2)
Total Protein: 6.9 g/dL (ref 6.5–8.1)

## 2019-03-05 MED ORDER — DIPHENHYDRAMINE HCL 25 MG PO CAPS
25.0000 mg | ORAL_CAPSULE | Freq: Once | ORAL | Status: AC
  Administered 2019-03-05: 13:00:00 25 mg via ORAL

## 2019-03-05 MED ORDER — DIPHENHYDRAMINE HCL 25 MG PO CAPS
ORAL_CAPSULE | ORAL | Status: AC
  Filled 2019-03-05: qty 1

## 2019-03-05 MED ORDER — ZOLEDRONIC ACID 4 MG/100ML IV SOLN
4.0000 mg | Freq: Once | INTRAVENOUS | Status: AC
  Administered 2019-03-05: 13:00:00 4 mg via INTRAVENOUS
  Filled 2019-03-05: qty 100

## 2019-03-05 MED ORDER — SODIUM CHLORIDE 0.9% FLUSH
10.0000 mL | INTRAVENOUS | Status: DC | PRN
  Administered 2019-03-05: 10 mL
  Filled 2019-03-05: qty 10

## 2019-03-05 MED ORDER — TRASTUZUMAB CHEMO 150 MG IV SOLR
6.0000 mg/kg | Freq: Once | INTRAVENOUS | Status: AC
  Administered 2019-03-05: 14:00:00 567 mg via INTRAVENOUS
  Filled 2019-03-05: qty 27

## 2019-03-05 MED ORDER — ACETAMINOPHEN 325 MG PO TABS
ORAL_TABLET | ORAL | Status: AC
  Filled 2019-03-05: qty 2

## 2019-03-05 MED ORDER — SODIUM CHLORIDE 0.9 % IV SOLN
Freq: Once | INTRAVENOUS | Status: AC
  Administered 2019-03-05: 13:00:00 via INTRAVENOUS
  Filled 2019-03-05: qty 250

## 2019-03-05 MED ORDER — SODIUM CHLORIDE 0.9% FLUSH
10.0000 mL | INTRAVENOUS | Status: DC | PRN
  Administered 2019-03-05: 14:00:00 10 mL
  Filled 2019-03-05: qty 10

## 2019-03-05 MED ORDER — HEPARIN SOD (PORK) LOCK FLUSH 100 UNIT/ML IV SOLN
500.0000 [IU] | Freq: Once | INTRAVENOUS | Status: AC | PRN
  Administered 2019-03-05: 14:00:00 500 [IU]
  Filled 2019-03-05: qty 5

## 2019-03-05 MED ORDER — ACETAMINOPHEN 325 MG PO TABS
650.0000 mg | ORAL_TABLET | Freq: Once | ORAL | Status: AC
  Administered 2019-03-05: 650 mg via ORAL

## 2019-03-05 NOTE — Patient Instructions (Signed)

## 2019-03-05 NOTE — Patient Instructions (Signed)
[facility] Discharge Instructions for Patients Receiving Chemotherapy Today you received the following chemotherapy agents:  Herceptin To help prevent nausea and vomiting after your treatment, we encourage you to take your nausea medication as prescribed.   If you develop nausea and vomiting that is not controlled by your nausea medication, call the clinic.   BELOW ARE SYMPTOMS THAT SHOULD BE REPORTED IMMEDIATELY:  *FEVER GREATER THAN 100.5 F  *CHILLS WITH OR WITHOUT FEVER  NAUSEA AND VOMITING THAT IS NOT CONTROLLED WITH YOUR NAUSEA MEDICATION  *UNUSUAL SHORTNESS OF BREATH  *UNUSUAL BRUISING OR BLEEDING  TENDERNESS IN MOUTH AND THROAT WITH OR WITHOUT PRESENCE OF ULCERS  *URINARY PROBLEMS  *BOWEL PROBLEMS  UNUSUAL RASH Items with * indicate a potential emergency and should be followed up as soon as possible.  Feel free to call the clinic should you have any questions or concerns. The clinic phone number is [phone].  Please show the CHEMO ALERT CARD at check-in to the Emergency Department and triage nurse.   

## 2019-03-06 LAB — CANCER ANTIGEN 27.29: CA 27.29: 256 U/mL — ABNORMAL HIGH (ref 0.0–38.6)

## 2019-03-08 ENCOUNTER — Other Ambulatory Visit: Payer: Self-pay

## 2019-03-08 ENCOUNTER — Other Ambulatory Visit: Payer: BLUE CROSS/BLUE SHIELD

## 2019-03-08 ENCOUNTER — Ambulatory Visit
Admission: RE | Admit: 2019-03-08 | Discharge: 2019-03-08 | Disposition: A | Discharge status: Home / Self Care | Payer: BLUE CROSS/BLUE SHIELD | Source: Ambulatory Visit | Attending: Radiation Oncology | Admitting: Radiation Oncology

## 2019-03-08 ENCOUNTER — Ambulatory Visit: Payer: BLUE CROSS/BLUE SHIELD

## 2019-03-08 ENCOUNTER — Ambulatory Visit: Payer: BLUE CROSS/BLUE SHIELD | Admitting: Oncology

## 2019-03-08 DIAGNOSIS — C7989 Secondary malignant neoplasm of other specified sites: Secondary | ICD-10-CM | POA: Diagnosis not present

## 2019-03-09 ENCOUNTER — Other Ambulatory Visit: Payer: Self-pay

## 2019-03-09 ENCOUNTER — Ambulatory Visit: Payer: BLUE CROSS/BLUE SHIELD | Admitting: Radiation Oncology

## 2019-03-09 ENCOUNTER — Ambulatory Visit
Admission: RE | Admit: 2019-03-09 | Discharge: 2019-03-09 | Disposition: A | Discharge status: Home / Self Care | Payer: BLUE CROSS/BLUE SHIELD | Source: Ambulatory Visit | Attending: Radiation Oncology | Admitting: Radiation Oncology

## 2019-03-09 DIAGNOSIS — C7989 Secondary malignant neoplasm of other specified sites: Secondary | ICD-10-CM

## 2019-03-09 NOTE — Progress Notes (Signed)
Simulation note The patient was brought to the treatment room for simulation for the patient's upcoming electron treatment. The patient was setup in the treatment position and the target region was delineated. The patient will receive treatment to the right chest wall using an en face electron field. One customized block/complex treatment device has been constructed for this purpose, and this will be used on a daily basis during the patient's treatment. After appropriate set up was confirmed, skin markings were placed to allow accurate targeting of the treatment area during the patient's course of therapy.  ------------------------------------------------ -----------------------------------  [name] Promise, PhD, MD

## 2019-03-10 ENCOUNTER — Ambulatory Visit
Admission: RE | Admit: 2019-03-10 | Discharge: 2019-03-10 | Disposition: A | Discharge status: Home / Self Care | Payer: BLUE CROSS/BLUE SHIELD | Source: Ambulatory Visit | Attending: Radiation Oncology | Admitting: Radiation Oncology

## 2019-03-10 ENCOUNTER — Other Ambulatory Visit: Payer: Self-pay

## 2019-03-10 DIAGNOSIS — C7989 Secondary malignant neoplasm of other specified sites: Secondary | ICD-10-CM | POA: Diagnosis not present

## 2019-03-10 NOTE — Progress Notes (Signed)
.  Simulation verification  The patient was brought to the treatment machine and placed in the plan treatment position.  Clinical set up was verified to ensure that the target region is appropriately covered for the patient's upcoming electron boost treatment.  The targeted volume of tissue is appropriately covered by the radiation field.  Based on my personal review, I approve the simulation verification.  The patient's treatment will proceed as planned.  ------------------------------------------------  -----------------------------------  Lorali Khamis D. Griselda Tosh, PhD, MD  

## 2019-03-11 ENCOUNTER — Other Ambulatory Visit: Payer: Self-pay

## 2019-03-11 ENCOUNTER — Ambulatory Visit
Admission: RE | Admit: 2019-03-11 | Discharge: 2019-03-11 | Disposition: A | Discharge status: Home / Self Care | Payer: BLUE CROSS/BLUE SHIELD | Source: Ambulatory Visit | Attending: Radiation Oncology | Admitting: Radiation Oncology

## 2019-03-11 DIAGNOSIS — C7989 Secondary malignant neoplasm of other specified sites: Secondary | ICD-10-CM | POA: Diagnosis not present

## 2019-03-12 ENCOUNTER — Other Ambulatory Visit: Payer: Self-pay

## 2019-03-12 ENCOUNTER — Ambulatory Visit
Admission: RE | Admit: 2019-03-12 | Discharge: 2019-03-12 | Disposition: A | Discharge status: Home / Self Care | Payer: BLUE CROSS/BLUE SHIELD | Source: Ambulatory Visit | Attending: Radiation Oncology | Admitting: Radiation Oncology

## 2019-03-12 DIAGNOSIS — C7989 Secondary malignant neoplasm of other specified sites: Secondary | ICD-10-CM | POA: Diagnosis not present

## 2019-03-16 ENCOUNTER — Other Ambulatory Visit: Payer: Self-pay

## 2019-03-16 ENCOUNTER — Ambulatory Visit
Admission: RE | Admit: 2019-03-16 | Discharge: 2019-03-16 | Disposition: A | Discharge status: Home / Self Care | Payer: BLUE CROSS/BLUE SHIELD | Source: Ambulatory Visit | Attending: Radiation Oncology | Admitting: Radiation Oncology

## 2019-03-16 DIAGNOSIS — C7989 Secondary malignant neoplasm of other specified sites: Secondary | ICD-10-CM | POA: Diagnosis not present

## 2019-03-17 ENCOUNTER — Other Ambulatory Visit: Payer: Self-pay | Admitting: *Deleted

## 2019-03-17 ENCOUNTER — Ambulatory Visit
Admission: RE | Admit: 2019-03-17 | Discharge: 2019-03-17 | Disposition: A | Discharge status: Home / Self Care | Payer: BLUE CROSS/BLUE SHIELD | Source: Ambulatory Visit | Attending: Radiation Oncology | Admitting: Radiation Oncology

## 2019-03-17 ENCOUNTER — Other Ambulatory Visit: Payer: Self-pay

## 2019-03-17 DIAGNOSIS — C50212 Malignant neoplasm of upper-inner quadrant of left female breast: Secondary | ICD-10-CM

## 2019-03-17 DIAGNOSIS — C7989 Secondary malignant neoplasm of other specified sites: Secondary | ICD-10-CM | POA: Diagnosis not present

## 2019-03-17 DIAGNOSIS — Z170 Estrogen receptor positive status [ER+]: Secondary | ICD-10-CM

## 2019-03-17 MED ORDER — LIDOCAINE-PRILOCAINE 2.5-2.5 % EX CREA: TOPICAL_CREAM | CUTANEOUS | 3 refills | Status: AC

## 2019-03-18 ENCOUNTER — Ambulatory Visit
Admission: RE | Admit: 2019-03-18 | Discharge: 2019-03-18 | Disposition: A | Discharge status: Home / Self Care | Payer: BLUE CROSS/BLUE SHIELD | Source: Ambulatory Visit | Attending: Radiation Oncology | Admitting: Radiation Oncology

## 2019-03-18 ENCOUNTER — Other Ambulatory Visit: Payer: Self-pay

## 2019-03-18 DIAGNOSIS — C7989 Secondary malignant neoplasm of other specified sites: Secondary | ICD-10-CM | POA: Diagnosis not present

## 2019-03-19 ENCOUNTER — Ambulatory Visit: Payer: BLUE CROSS/BLUE SHIELD

## 2019-03-22 ENCOUNTER — Ambulatory Visit: Payer: BC Managed Care – PPO

## 2019-03-23 ENCOUNTER — Ambulatory Visit: Payer: BC Managed Care – PPO

## 2019-03-23 ENCOUNTER — Other Ambulatory Visit: Payer: Self-pay | Admitting: *Deleted

## 2019-03-23 MED ORDER — TOREMIFENE CITRATE 60 MG PO TABS: 60.0000 mg | ORAL_TABLET | Freq: Every day | ORAL | 12 refills | Status: AC

## 2019-03-24 MED FILL — TOREMIFENE CITRATE 60 MG TA: 60 | 30 days supply | Qty: 30 | Fill #0

## 2019-03-28 ENCOUNTER — Other Ambulatory Visit: Payer: Self-pay | Admitting: Oncology

## 2019-03-30 ENCOUNTER — Other Ambulatory Visit: Payer: Self-pay | Admitting: Oncology

## 2019-04-05 ENCOUNTER — Inpatient Hospital Stay: Payer: BC Managed Care – PPO

## 2019-04-05 ENCOUNTER — Other Ambulatory Visit: Payer: Self-pay

## 2019-04-05 ENCOUNTER — Encounter: Payer: Self-pay | Admitting: Adult Health

## 2019-04-05 ENCOUNTER — Inpatient Hospital Stay: Payer: BC Managed Care – PPO | Attending: Oncology

## 2019-04-05 ENCOUNTER — Inpatient Hospital Stay (HOSPITAL_BASED_OUTPATIENT_CLINIC_OR_DEPARTMENT_OTHER): Payer: BC Managed Care – PPO | Admitting: Adult Health

## 2019-04-05 VITALS — BP 131/54 | HR 100 | Temp 98.5°F | Resp 18 | Ht 69.0 in | Wt 206.5 lb

## 2019-04-05 DIAGNOSIS — C50212 Malignant neoplasm of upper-inner quadrant of left female breast: Secondary | ICD-10-CM

## 2019-04-05 DIAGNOSIS — Z170 Estrogen receptor positive status [ER+]: Secondary | ICD-10-CM | POA: Insufficient documentation

## 2019-04-05 DIAGNOSIS — C7951 Secondary malignant neoplasm of bone: Secondary | ICD-10-CM | POA: Insufficient documentation

## 2019-04-05 DIAGNOSIS — C50912 Malignant neoplasm of unspecified site of left female breast: Secondary | ICD-10-CM

## 2019-04-05 DIAGNOSIS — Z5112 Encounter for antineoplastic immunotherapy: Secondary | ICD-10-CM | POA: Insufficient documentation

## 2019-04-05 DIAGNOSIS — C7989 Secondary malignant neoplasm of other specified sites: Secondary | ICD-10-CM

## 2019-04-05 DIAGNOSIS — Z95828 Presence of other vascular implants and grafts: Secondary | ICD-10-CM

## 2019-04-05 LAB — COMPREHENSIVE METABOLIC PANEL
ALT: 12 U/L (ref 0–44)
AST: 18 U/L (ref 15–41)
Albumin: 3.5 g/dL (ref 3.5–5.0)
Alkaline Phosphatase: 101 U/L (ref 38–126)
Anion gap: 10 (ref 5–15)
BUN: 11 mg/dL (ref 8–23)
CO2: 22 mmol/L (ref 22–32)
Calcium: 9 mg/dL (ref 8.9–10.3)
Chloride: 107 mmol/L (ref 98–111)
Creatinine, Ser: 0.77 mg/dL (ref 0.44–1.00)
GFR calc Af Amer: >60 mL/min (ref >60)
GFR calc non Af Amer: >60 mL/min (ref >60)
Glucose, Bld: 108 mg/dL — ABNORMAL HIGH (ref 70–99)
Potassium: 4.2 mmol/L (ref 3.5–5.1)
Sodium: 139 mmol/L (ref 135–145)
Total Bilirubin: 0.4 mg/dL (ref 0.3–1.2)
Total Protein: 6.6 g/dL (ref 6.5–8.1)

## 2019-04-05 LAB — CBC WITH DIFFERENTIAL/PLATELET
Abs Immature Granulocytes: 0.03 10*3/uL (ref 0.00–0.07)
Basophils Absolute: 0 10*3/uL (ref 0.0–0.1)
Basophils Relative: 1 %
Eosinophils Absolute: 0.2 10*3/uL (ref 0.0–0.5)
Eosinophils Relative: 6 %
HCT: 36.2 % (ref 36.0–46.0)
Hemoglobin: 11 g/dL — ABNORMAL LOW (ref 12.0–15.0)
Immature Granulocytes: 1 %
Lymphocytes Relative: 35 %
Lymphs Abs: 1.3 10*3/uL (ref 0.7–4.0)
MCH: 27.2 pg (ref 26.0–34.0)
MCHC: 30.4 g/dL (ref 30.0–36.0)
MCV: 89.4 fL (ref 80.0–100.0)
Monocytes Absolute: 0.5 10*3/uL (ref 0.1–1.0)
Monocytes Relative: 14 %
Neutro Abs: 1.6 10*3/uL — ABNORMAL LOW (ref 1.7–7.7)
Neutrophils Relative %: 43 %
Platelets: 209 10*3/uL (ref 150–400)
RBC: 4.05 MIL/uL (ref 3.87–5.11)
RDW: 21.6 % — ABNORMAL HIGH (ref 11.5–15.5)
WBC: 3.8 10*3/uL — ABNORMAL LOW (ref 4.0–10.5)
nRBC: 0 % (ref 0.0–0.2)

## 2019-04-05 MED ORDER — SODIUM CHLORIDE 0.9% FLUSH
10.0000 mL | INTRAVENOUS | Status: DC | PRN
  Administered 2019-04-05: 10 mL
  Filled 2019-04-05: qty 10

## 2019-04-05 MED ORDER — SODIUM CHLORIDE 0.9 % IV SOLN
Freq: Once | INTRAVENOUS | Status: AC
  Administered 2019-04-05: 10:00:00 via INTRAVENOUS
  Filled 2019-04-05: qty 250

## 2019-04-05 MED ORDER — TRASTUZUMAB CHEMO 150 MG IV SOLR
6.0000 mg/kg | Freq: Once | INTRAVENOUS | Status: AC
  Administered 2019-04-05: 11:00:00 567 mg via INTRAVENOUS
  Filled 2019-04-05: qty 27

## 2019-04-05 MED ORDER — ACETAMINOPHEN 325 MG PO TABS
650.0000 mg | ORAL_TABLET | Freq: Once | ORAL | Status: AC
  Administered 2019-04-05: 10:00:00 650 mg via ORAL

## 2019-04-05 MED ORDER — HEPARIN SOD (PORK) LOCK FLUSH 100 UNIT/ML IV SOLN
500.0000 [IU] | Freq: Once | INTRAVENOUS | Status: AC | PRN
  Administered 2019-04-05: 11:00:00 500 [IU]
  Filled 2019-04-05: qty 5

## 2019-04-05 MED ORDER — DIPHENHYDRAMINE HCL 25 MG PO CAPS
25.0000 mg | ORAL_CAPSULE | Freq: Once | ORAL | Status: AC
  Administered 2019-04-05: 25 mg via ORAL

## 2019-04-05 MED ORDER — DIPHENHYDRAMINE HCL 25 MG PO CAPS
ORAL_CAPSULE | ORAL | Status: AC
  Filled 2019-04-05: qty 1

## 2019-04-05 MED ORDER — ACETAMINOPHEN 325 MG PO TABS
ORAL_TABLET | ORAL | Status: AC
  Filled 2019-04-05: qty 2

## 2019-04-05 NOTE — Progress Notes (Signed)
[place]  [phone] [phone]    ID: Debra HAECKER DOB: 11-25-1955  MR#: [id number]  TGG#:[id number]  Patient Care Team: Chesley Noon, MD as PCP - General (Family Medicine) Excell Seltzer, MD as Consulting Physician (General Surgery) [name], [name], MD as Consulting Physician (Oncology) Gery Pray, MD as Consulting Physician (Radiation Oncology) Collene Gobble, MD as Consulting Physician (Pulmonary Disease) Verdell Carmine, MD as Referring Physician (Internal Medicine) Larey Dresser, MD as Consulting Physician (Cardiology) OTHER MD: Dr Lyda Jester [phone])   CHIEF COMPLAINT: estrogen receptor positive, HER-2 positive breast cancer; 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   Blen recently completed palliative radiation to the chest wall on 03/18/2019.  She completed Capecitabine sensitization with this.  She notes she tolerated the Capecitabine well and her skin is conitnuing to heal from the radiation.  It is improving.    Loree is due for another dose of trastuzumab today.  Her last echo was on 5/12 and shows ef of 55-60%.  Cassidy takes Toremifene at night.  She notes no issues from this.  Etheline receives Zolendronate every three months and her last dose was on 03/05/2019.  She has no dental issues.   REVIEW OF SYSTEMS: Tamiya is doing well.  She has fatigue from time to time, but it is manageable.  She is living at home with her husband, daughter and son in law, and her 62 year old grandson.  She is practicing social distancing.    Shalandra is up and down and staying busy with her grandson.  She hasn't started walking outdoors.  She denies any new issues.  She  is taking Alprazolam XR for her anxiety and is tolerating it very well.  She takes this in the morning.    Abi denies any new pain.  She has no fever or chills.  She is without cough, shortness of breath, chest pain, or palpitations.  She has no dysphagia, decreased appetite, nausea, vomiting, bowel or bladder changes.  She hasn't noted new headaches or vision issues.  A detailed ROS was otherwise non contributory.    BREAST CANCER HISTORY: From the original intake note:  "[name]" had a fall while traveling living to [place] in their RV and injure her left upper arm and  her left breast . Approximately 3 weeks later she noted a mass in the left breast upper inner quadrant. It was somewhat tender. She brought this to medical attention and on 09/27/2016 underwent bilateral diagnostic mammography with tomography [facility] The breast density was category B. The right breast was benign. In the left breast upper inner quadrant there was a large lobulated hyperdense mass which by exam was firm and nonmobile. By ultrasonography in the 11:30 o'clock radius 4 cm from the nipple there was a mass extending to the subcutaneous tissues and measuring 4.9 cm. There was a small superficial fluid collection overlying this consistent with a hematoma. The left axilla was benign.  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  The patient's subsequent history  is as detailed below   PAST MEDICAL HISTORY: Past Medical History:  Diagnosis Date  . Anxiety   . CAP (community acquired pneumonia)   . Depression   . Malignant neoplasm of upper-inner quadrant of left female breast ([place]) 10/01/2016  . Personal  history of chemotherapy   . Tobacco use     PAST SURGICAL HISTORY: Past Surgical History:  Procedure Laterality Date  . ABDOMINAL HYSTERECTOMY  2011   Laparoscopic  . AUGMENTATION MAMMAPLASTY Bilateral 03/2017  . BREAST LUMPECTOMY Left 04/05/2017  . BREAST LUMPECTOMY WITH SENTINEL LYMPH NODE BIOPSY Left 04/07/2017  . BREAST REDUCTION SURGERY Left 04/07/2017  . CHOLECYSTECTOMY    . MASTECTOMY Bilateral 08/25/2017   at [facility]  . open reduction left ankle    . SKIN GRAFT Left 06/19/2017   bil skin grafts to breasts    FAMILY HISTORY: Family History  Problem Relation Age of Onset  . Heart disease Mother   . Heart disease Father   . Cancer Sister        ovarian ca   The patient's father died at the age of 9 from a myocardial infarction. The patient's mother died at the age of 34 with congestive heart failure. The patient had one brother, 2 sisters. One of her sisters was diagnosed with ovarian cancer at age 43 and died at age 49 from that disease   GYNECOLOGIC HISTORY:  No LMP recorded. Patient has had a hysterectomy. Menarche age 86, first live birth age 11. The patient is GX P1. She stopped having periods approximately 2008. She did not take hormone replacement. She used oral contraceptives for approximately 30 years with no complications.   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 Her husband [name] is retired from [employer]. Their daughter Davi Rotan, used to live in [place], but has moved back to [place]. [name]'s husband works in Engineer, technical sales for healthcare systems. The patient welcomed her 1st grandchild, a boy named Heinle in May 2019.       ADVANCED DIRECTIVES: not in place   HEALTH MAINTENANCE: Social History   Tobacco Use  . Smoking status: Former Smoker    Packs/day: 0.50    Years: 20.00    Pack years: 10.00    Quit date: 10/21/2016    Years since quitting: 2.4  . Smokeless  tobacco: Never Used  Substance Use Topics  . Alcohol use: No  . Drug use: No     Colonoscopy:  PAP:  Bone density:   Allergies  Allergen Reactions  . Propofol Other (See Comments)    Vivid dreams (HAD THIS IN ICU WHEN SHE HAD DOUBLE PNEUMONIA.) Vivid dreams (HAD THIS IN ICU WHEN SHE HAD DOUBLE PNEUMONIA, with SNKNLZ)7673 March; states, "Anesthesiologist said that only happened since I was on it for a prolonged time but I'd be good for use in surgery. Vivid dreams (HAD THIS IN ICU WHEN SHE HAD DOUBLE PNEUMONIA, with ALPFXT)0240 March; states, "Anesthesiologist said that only happened since I was on it for a prolonged time but I'd be good for use in surgery. Vivid dreams (HAD THIS IN ICU WHEN SHE HAD DOUBLE PNEUMONIA.)     Current Outpatient Medications  Medication Sig Dispense Refill  . acetaminophen (TYLENOL) 325 MG tablet Take 650 mg by mouth every 6 (six) hours as needed for moderate pain.     Marland Kitchen ALPRAZolam (XANAX XR) 1 MG 24 hr tablet TAKE 1 TABLET BY  MOUTH EVERY DAY 30 tablet 1  . busPIRone (BUSPAR) 15 MG tablet TAKE 1 TABLET (15 MG TOTAL) BY MOUTH 3 (THREE) TIMES DAILY. (Patient taking differently: Take 30 mg by mouth at bedtime. ) 270 tablet 0  . diphenhydrAMINE (BENADRYL) 25 MG tablet Take 50 mg by mouth as needed for sleep.     Marland Kitchen ibuprofen (ADVIL,MOTRIN) 200 MG tablet Take 600 mg by mouth every 6 (six) hours as needed for moderate pain.     Marland Kitchen lidocaine-prilocaine (EMLA) cream Apply to affected area once 30 g 3  . omeprazole (PRILOSEC) 40 MG capsule TAKE 1 CAPSULE BY MOUTH EVERY DAY 90 capsule 1  . ondansetron (ZOFRAN) 8 MG tablet Take 1 tablet (8 mg total) by mouth 2 (two) times daily as needed for refractory nausea / vomiting. Start on day 3 after chemotherapy. 30 tablet 1  . PARoxetine (PAXIL) 40 MG tablet TAKE 1 TABLET BY MOUTH EVERY DAY IN THE MORNING 90 tablet 3  . prochlorperazine (COMPAZINE) 10 MG tablet Take 1 tablet (10 mg total) by mouth every 6 (six) hours as needed  (Nausea or vomiting). 30 tablet 1  . tiZANidine (ZANAFLEX) 4 MG tablet TAKE 1 TABLET BY MOUTH EVERYDAY AT BEDTIME 30 tablet 1  . Toremifene Citrate (FARESTON) 60 MG tablet Take 1 tablet (60 mg total) by mouth daily. 30 tablet 12  . valACYclovir (VALTREX) 1000 MG tablet Take 1 tablet (1,000 mg total) by mouth daily. 90 tablet 2   No current facility-administered medications for this visit.    Facility-Administered Medications Ordered in Other Visits  Medication Dose Route Frequency Provider Last Rate Last Dose  . sodium chloride flush (NS) 0.9 % injection 10 mL  10 mL Intracatheter Once [name], [name], MD         OBJECTIVE: Vitals:   04/05/19 0923  BP: (!) 131/54  Pulse: 100  Resp: 18  Temp: 98.5 F (36.9 C)  SpO2: 98%     Body mass index is 30.49 kg/m.   Filed Weights   04/05/19 0923  Weight: 206 lb 8 oz (93.7 kg)     ECOG FS:1 - Symptomatic but completely ambulatory  GENERAL: Patient is a well appearing female in no acute distress HEENT:  Sclerae anicteric.  Oropharynx clear and moist. No ulcerations or evidence of oropharyngeal candidiasis. Neck is supple.  NODES:  No cervical, supraclavicular, or axillary lymphadenopathy palpated.  BREAST EXAM:  S/p bilateral mastectomies, radiation changes noted to right chest wall, with small area of moist desquamation, appears to be healing well.   LUNGS:  Clear to auscultation bilaterally.  No wheezes or rhonchi. HEART:  Regular rate and rhythm. No murmur appreciated. ABDOMEN:  Soft, nontender.  Positive, normoactive bowel sounds. No organomegaly palpated. MSK:  No focal spinal tenderness to palpation.  EXTREMITIES:  No peripheral edema.   SKIN:  Clear with no obvious rashes or skin changes. No nail dyscrasia. NEURO:  Nonfocal. Well oriented.  Appropriate affect.    LAB RESULTS:  CMP     Component Value Date/Time   NA 139 03/05/2019 1115   NA 140 10/24/2017 1307   K 3.8 03/05/2019 1115   K 5.4 (H) 10/24/2017 1307   CL 106  03/05/2019 1115   CO2 24 03/05/2019 1115   CO2 29 10/24/2017 1307   GLUCOSE 163 (H) 03/05/2019 1115   GLUCOSE 103 10/24/2017 1307   BUN 17 03/05/2019 1115   BUN 18.2 10/24/2017 1307   CREATININE 0.83 03/05/2019 1115   CREATININE 0.80  02/08/2019 0900   CREATININE 0.8 10/24/2017 1307   CALCIUM 8.8 (L) 03/05/2019 1115   CALCIUM 10.3 10/24/2017 1307   PROT 6.9 03/05/2019 1115   PROT 7.5 10/24/2017 1307   ALBUMIN 3.4 (L) 03/05/2019 1115   ALBUMIN 4.0 10/24/2017 1307   AST 18 03/05/2019 1115   AST 31 02/08/2019 0900   AST 9 10/24/2017 1307   ALT 10 03/05/2019 1115   ALT 8 02/08/2019 0900   ALT 8 10/24/2017 1307   ALKPHOS 112 03/05/2019 1115   ALKPHOS 115 10/24/2017 1307   BILITOT 0.2 (L) 03/05/2019 1115   BILITOT 0.3 02/08/2019 0900   BILITOT 0.28 10/24/2017 1307   GFRNONAA >60 03/05/2019 1115   GFRNONAA >60 02/08/2019 0900   GFRAA >60 03/05/2019 1115   GFRAA >60 02/08/2019 0900    INo results found for: SPEP, UPEP  Lab Results  Component Value Date   WBC 3.8 (L) 04/05/2019   NEUTROABS 1.6 (L) 04/05/2019   HGB 11.0 (L) 04/05/2019   HCT 36.2 04/05/2019   MCV 89.4 04/05/2019   PLT 209 04/05/2019      Chemistry      Component Value Date/Time   NA 139 03/05/2019 1115   NA 140 10/24/2017 1307   K 3.8 03/05/2019 1115   K 5.4 (H) 10/24/2017 1307   CL 106 03/05/2019 1115   CO2 24 03/05/2019 1115   CO2 29 10/24/2017 1307   BUN 17 03/05/2019 1115   BUN 18.2 10/24/2017 1307   CREATININE 0.83 03/05/2019 1115   CREATININE 0.80 02/08/2019 0900   CREATININE 0.8 10/24/2017 1307      Component Value Date/Time   CALCIUM 8.8 (L) 03/05/2019 1115   CALCIUM 10.3 10/24/2017 1307   ALKPHOS 112 03/05/2019 1115   ALKPHOS 115 10/24/2017 1307   AST 18 03/05/2019 1115   AST 31 02/08/2019 0900   AST 9 10/24/2017 1307   ALT 10 03/05/2019 1115   ALT 8 02/08/2019 0900   ALT 8 10/24/2017 1307   BILITOT 0.2 (L) 03/05/2019 1115   BILITOT 0.3 02/08/2019 0900   BILITOT 0.28 10/24/2017  1307       No results found for: LABCA2  No components found for: LABCA125  No results for input(s): INR in the last 168 hours.  Urinalysis    Component Value Date/Time   COLORURINE STRAW (A) 10/30/2017 2050   APPEARANCEUR CLEAR 10/30/2017 2050   LABSPEC 1.006 10/30/2017 2050   [place] 6.0 10/30/2017 2050   GLUCOSEU NEGATIVE 10/30/2017 2050   [place] NEGATIVE 10/30/2017 2050   [place] NEGATIVE 10/30/2017 2050   [place] 10/30/2017 2050   PROTEINUR NEGATIVE 10/30/2017 2050   NITRITE NEGATIVE 10/30/2017 2050   LEUKOCYTESUR NEGATIVE 10/30/2017 2050     STUDIES: No results found.  ELIGIBLE FOR AVAILABLE RESEARCH PROTOCOL: no   ASSESSMENT: 63 y.o. [employer], [place] woman status post left breast upper inner quadrant biopsy 09/30/2016 for a clinical T2 N0, stage II a invasive ductal carcinoma, grade 3, estrogen receptor positive, progesterone receptor negative, HER-2 not amplified, with an MIB-1 of 80%.  (1) genetics testing not actionable (see below).  (2) neoadjuvant chemotherapy given 10/30/2016 to 03/12/2017 consisting of cyclophosphamide and doxorubicin in dose dense fashion 4  followed by paclitaxel 1, then Abraxane (x8?)  further treatments discontinued because of neuropathy.  (3) status post left lumpectomy with oncoplasty and right reduction mammoplasty showing a residual T2 N1 invasive ductal carcinoma, with lymphovascular invasion, but negative margins; the tumor is now HER-2 positive.  (a)  postop PET scan showed no evidence of metastatic disease.  (4) adjuvant trastuzumab/pertuzumab started 06/20/2017 (received at [facility]), started here on 07/11/2017--continued through April 2019 when patient left for [place]  (a) Echo on 06/18/2017 at [facility]: LVEF 55-60%  (b) echocardiogram 09/26/2017 shows an ejection fraction in the 60-65% range.  (c) echocardiogram 12/26/2017 shows an ejection fraction in the 55-60% range  (d) see below for subsequent echocardiogram  results   (5) adjuvant radiation was to follow, but see (7) below  (6) anti-estrogens: Letrozole started post-op, discontinued with progression OCT 2018  (a) DEXA on 04/03/2017 demonstrated a T score of -1.3 in the right femoral neck  (b) Zometa given at [facility] on 06/20/2017  RECURRENCE: (7) Patient developed a left breast nodule and underwent mammogram and ultrasound on 08/06/17 showing three areas of concern at 10 o'clock, 1130 o'clock, and at 8 o'clock. Biopsy on 08/07/2017 demonstrated at 11:30 fat necrosis, however at 10:00 it was positive for IDC, grade 3, ER+(50%), PR-(0%), Ki-67 90%, HER-2 negative (ratio 1.48).  (a) staging chest CT 08/15/2017 shows 0.5 cm left lower lobe lung nodule of uncertain significance  (8) status post left modified radical mastectomy and right simple mastectomy 08/25/2017 showing  (a) right breast, no evidence of malignancy  (b) left breast, ypT2 ypN1 invasive ductal carcinoma, grade 3, with negative margins; repeat prognostic panel: Triple negative  (9) started adjuvant chemotherapy with cyclophosphamide/methotrexate/fluorouracil [CMF] 11/10/2016, eight cycles planned  (a) continuing trastuzumab/pertuzumab   (b) CMF chemotherapy discontinued after 4 cycles with disease progression  (10) status post left chest wall biopsy for further local recurrence 01/05/2018, the tumor being now weakly estrogen receptor positive, progesterone receptor negative, and again HER-2 negative  (11) PET scan 03/25/2018 negative except for a 1.2 cm left internal mammary lymph node, SUV 6.9  (12) adjuvant radiation completed 03/19/2018 (30 doses to left chest wall area under Dr Modesto Charon at [facility]  (a) did not receive capecitabine sensitization  (13)  Letrozole resumed 03/27/2018--discontinued 06/29/2018  (14) foundation one 01/09/2018 shows no actionable mutations, with stable microsatellite status and low mutational burden.  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 (a)  PD-L1 testing on immune cells, 0% staining (negative)--obtained on 08/07/2017 cycle, which was weakly ER positive, progesterone and HER-2 negative (b) genetics testing through [facility] October 31, 2016 found no deleterious mutations in ATM, BRCA1, BRCA2, BRI P1, CDH 1, CH EK 2, EPCAM, MLH1, MSH2, MSH6, NBN, NF1, PALB 2, PMS2, PTEN, RAD 51C, RAD 51D, STK 1 1, or TP 53.  (15) trastuzumab and pertuzumab resumed 03/31/2018 (a) echocardiogram on 04/20/2018 showed an ejection fraction in the 50-55% range.  (b) changed to every 4-week treatments beginning 07/01/2018 to coordinate with fulvestrant (c) echocardiogram 08/17/2018 shows a well-preserved ejection fraction. (d) pertuzumab discontinued as of December 2019  (16) fine-needle aspiration of a chest wall lesion on 06/04/2018 was read as "non-small cell carcinoma".  Punch biopsy obtained at the visit 06/08/2018 for prognostic panel determination showed poorly differentiated carcinoma, estrogen receptor positive at 60%, progesterone receptor negative, HER-2 negative by immunohistochemistry at 1+  (17) fulvestrant started 07/01/2018, repeat every 28 days  (a) mid September started palbociclib 125 mg daily, 21/7,   (b) discontinued with evidence of mixed response  (18) carboplatin/gemcitabine days 1 and 8 started 09/29/2018, repeated every 21 days (a) baseline PET scan 09/23/2018 shows progression on prior treatment, measurable disease (b) CA-27-29 is now informative   (c) cycle 2 of carboplatin/gemcitabine delayed 1 week  (d) carboplatin/gemcitabine discontinued after 3  cycles with evidence of disease progression  (19) PET scan 12/10/2018 shows progression in chest wall and sternum, stable disease in nodes and other blytic bone lesions  (20) Fareston/toremifene started 12/28/2018  (21) palliative radiation to the right chest wall scjheduled for 02/11/2019 - 03/18/2019  (a) will receive capecitabine radiosensitization   PLAN:   Aften is doing  well today.  She has no clinical signs of progression of her metastatic breast cancer.  She has completed her adjuvant radiation with Capecitabine sensitization.  She tolerated this well and her breast is healing well.  Odetta will continue on her current therapy with Toremifene daily, Zolendronate every 3 months, and Trastuzumab every 3 weeks.    PMP aware reviewed on 04/05/2019, last fill of Alprazolam ER on 03/08/2019 #30.  She has her pharmacy request this electronically.  She is tolerating this well, and it is managing her anxiety well at this point.    Helene Kelp and I reviewed her upcoming appointments.  She is not going to undergo scans at this point, but will revisit this topic with Dr. Jana Hakim at her next visit in 3 weeks.    I recommended she increase her activity level/exercise.  We will see her back in 3 weeks.  She knows to call for any other issues that may develop before the next visit.  A total of (30) minutes of face-to-face time was spent with this patient with greater than 50% of that time in counseling and care-coordination.   Wilber Bihari, NP 04/05/19 9:50 AM Medical Oncology and Hematology [facility] [address] [place],  [address] Tel. [phone]    Fax. [phone]

## 2019-04-05 NOTE — Patient Instructions (Signed)
[facility] Discharge Instructions for Patients Receiving Chemotherapy Today you received the following chemotherapy agents:  Herceptin To help prevent nausea and vomiting after your treatment, we encourage you to take your nausea medication as prescribed.   If you develop nausea and vomiting that is not controlled by your nausea medication, call the clinic.   BELOW ARE SYMPTOMS THAT SHOULD BE REPORTED IMMEDIATELY:  *FEVER GREATER THAN 100.5 F  *CHILLS WITH OR WITHOUT FEVER  NAUSEA AND VOMITING THAT IS NOT CONTROLLED WITH YOUR NAUSEA MEDICATION  *UNUSUAL SHORTNESS OF BREATH  *UNUSUAL BRUISING OR BLEEDING  TENDERNESS IN MOUTH AND THROAT WITH OR WITHOUT PRESENCE OF ULCERS  *URINARY PROBLEMS  *BOWEL PROBLEMS  UNUSUAL RASH Items with * indicate a potential emergency and should be followed up as soon as possible.  Feel free to call the clinic should you have any questions or concerns. The clinic phone number is [phone].  Please show the CHEMO ALERT CARD at check-in to the Emergency Department and triage nurse.   

## 2019-04-06 ENCOUNTER — Encounter: Payer: Self-pay | Admitting: Oncology

## 2019-04-06 ENCOUNTER — Other Ambulatory Visit: Payer: Self-pay | Admitting: Oncology

## 2019-04-06 DIAGNOSIS — Z170 Estrogen receptor positive status [ER+]: Secondary | ICD-10-CM

## 2019-04-06 DIAGNOSIS — C50212 Malignant neoplasm of upper-inner quadrant of left female breast: Secondary | ICD-10-CM

## 2019-04-06 LAB — CANCER ANTIGEN 27.29: CA 27.29: 138.6 U/mL — ABNORMAL HIGH (ref 0.0–38.6)

## 2019-04-07 ENCOUNTER — Other Ambulatory Visit: Payer: Self-pay | Admitting: *Deleted

## 2019-04-14 ENCOUNTER — Encounter: Payer: Self-pay | Admitting: Radiation Oncology

## 2019-04-14 NOTE — Progress Notes (Signed)
  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  Estrogen receptor positive, HER-2 positive breast cancer; triple negative breast cancers     Indication for treatment:  Palliative       Radiation treatment dates:  02/04/19 -03/18/19  Site/dose:   1. Chest Wall right; 50 Gy in 25 fractions of 2 Gy         2. Boost; 10 Gy in 5 fractions of 2 Gy  Beams/energy:   1. Photon IMRT; 6X        2. Photon IMRT  Narrative: The patient tolerated radiation treatment relatively well.   She developed moist desquamation, hyperpigmentation and peeling on the right chest wall. By the end of treatment, pt reported pain in her chest area, moderate fatigue, and itching and burning on her chest. She reported using Radiaplex and hydrocortisone cream as prescribed.   Plan: The patient has completed radiation treatment. The patient will return to radiation oncology clinic for routine followup in one month. I advised them to call or return sooner if they have any questions or concerns related to their recovery or treatment.  -----------------------------------  [name] Promise, PhD, MD  This document serves as a record of services personally performed by Gery Pray, MD. It was created on his behalf by Mary-Margaret Loma Messing, a trained medical scribe. The creation of this record is based on the scribe's personal observations and the provider's statements to them. This document has been checked and approved by the attending provider.

## 2019-04-19 ENCOUNTER — Ambulatory Visit: Payer: Self-pay | Admitting: Radiation Oncology

## 2019-04-19 MED ORDER — PAROXETINE HCL 20 MG PO TABS
40.00 | ORAL_TABLET | ORAL | Status: DC
Start: 2019-04-30 — End: 2019-04-19

## 2019-04-19 MED ORDER — IPRATROPIUM-ALBUTEROL 0.5-2.5 (3) MG/3ML IN SOLN
3.00 | RESPIRATORY_TRACT | Status: DC
Start: 2019-04-19 — End: 2019-04-19

## 2019-04-19 MED ORDER — GENERIC EXTERNAL MEDICATION
4.50 | Status: DC
Start: 2019-04-22 — End: 2019-04-19

## 2019-04-19 MED ORDER — MORPHINE SULFATE 2 MG/ML IJ SOLN
1.00 | INTRAMUSCULAR | Status: DC
Start: ? — End: 2019-04-19

## 2019-04-19 MED ORDER — OMEPRAZOLE 40 MG PO CPDR
40.00 | DELAYED_RELEASE_CAPSULE | ORAL | Status: DC
Start: 2019-04-20 — End: 2019-04-19

## 2019-04-19 MED ORDER — GENERIC EXTERNAL MEDICATION
1750.00 | Status: DC
Start: 2019-04-19 — End: 2019-04-19

## 2019-04-19 MED ORDER — ENOXAPARIN SODIUM 40 MG/0.4ML ~~LOC~~ SOLN
40.00 | SUBCUTANEOUS | Status: DC
Start: 2019-05-01 — End: 2019-04-19

## 2019-04-19 MED ORDER — ALPRAZOLAM 0.5 MG PO TABS
0.50 | ORAL_TABLET | ORAL | Status: DC
Start: 2019-04-19 — End: 2019-04-19

## 2019-04-19 MED ORDER — TOREMIFENE CITRATE 60 MG PO TABS
60.00 | ORAL_TABLET | ORAL | Status: DC
Start: 2019-04-22 — End: 2019-04-19

## 2019-04-19 MED ORDER — SENNOSIDES-DOCUSATE SODIUM 8.6-50 MG PO TABS
2.00 | ORAL_TABLET | ORAL | Status: DC
Start: ? — End: 2019-04-19

## 2019-04-19 MED ORDER — GENERIC EXTERNAL MEDICATION
15.00 | Status: DC
Start: 2019-04-30 — End: 2019-04-19

## 2019-04-19 MED ORDER — POLYETHYLENE GLYCOL 3350 17 G PO PACK
17.00 | PACK | ORAL | Status: DC
Start: ? — End: 2019-04-19

## 2019-04-19 MED ORDER — DIPHENHYDRAMINE HCL 25 MG PO CAPS
50.00 | ORAL_CAPSULE | ORAL | Status: DC
Start: 2019-04-19 — End: 2019-04-19

## 2019-04-19 MED ORDER — TIZANIDINE HCL 2 MG PO TABS
4.00 | ORAL_TABLET | ORAL | Status: DC
Start: 2019-04-30 — End: 2019-04-19

## 2019-04-19 MED ORDER — ACETAMINOPHEN 325 MG PO TABS
650.00 | ORAL_TABLET | ORAL | Status: DC
Start: ? — End: 2019-04-19

## 2019-04-21 ENCOUNTER — Other Ambulatory Visit: Payer: Self-pay | Admitting: Oncology

## 2019-04-22 ENCOUNTER — Other Ambulatory Visit: Payer: Self-pay | Admitting: Oncology

## 2019-04-22 MED ORDER — GLUCAGON HCL RDNA (DIAGNOSTIC) 1 MG IJ SOLR
1.00 | INTRAMUSCULAR | Status: DC
Start: ? — End: 2019-04-22

## 2019-04-22 MED ORDER — FENTANYL CITRATE (PF) 50 MCG/ML IJ SOLN
25.00 | INTRAMUSCULAR | Status: DC
Start: ? — End: 2019-04-22

## 2019-04-22 MED ORDER — GENERIC EXTERNAL MEDICATION
0.20 | Status: DC
Start: ? — End: 2019-04-22

## 2019-04-22 MED ORDER — PANTOPRAZOLE SODIUM 40 MG IV SOLR
40.00 | INTRAVENOUS | Status: DC
Start: 2019-04-30 — End: 2019-04-22

## 2019-04-22 MED ORDER — DOCUSATE SODIUM 150 MG/15ML PO LIQD
100.00 | ORAL | Status: DC
Start: 2019-04-29 — End: 2019-04-22

## 2019-04-22 MED ORDER — ACETAMINOPHEN 325 MG PO TABS
650.00 | ORAL_TABLET | ORAL | Status: DC
Start: ? — End: 2019-04-22

## 2019-04-22 MED ORDER — FUROSEMIDE 10 MG/ML IJ SOLN
20.00 | INTRAMUSCULAR | Status: DC
Start: 2019-04-22 — End: 2019-04-22

## 2019-04-22 MED ORDER — AZITHROMYCIN 200 MG/5ML PO SUSR
250.00 | ORAL | Status: DC
Start: 2019-04-22 — End: 2019-04-22

## 2019-04-22 MED ORDER — PROPOFOL 100 MG/10ML IV EMUL
5.00 | INTRAVENOUS | Status: DC
Start: ? — End: 2019-04-22

## 2019-04-22 MED ORDER — POLYETHYLENE GLYCOL 3350 17 G PO PACK
17.00 | PACK | ORAL | Status: DC
Start: 2019-04-30 — End: 2019-04-22

## 2019-04-22 MED ORDER — FENTANYL CITRATE (PF) 50 MCG/ML IJ SOLN
50.00 | INTRAMUSCULAR | Status: DC
Start: ? — End: 2019-04-22

## 2019-04-22 MED ORDER — GENERIC EXTERNAL MEDICATION
0.25 | Status: DC
Start: ? — End: 2019-04-22

## 2019-04-22 MED ORDER — SENNA 8.8 MG/5ML PO SYRP
8.80 | ORAL_SOLUTION | ORAL | Status: DC
Start: 2019-04-29 — End: 2019-04-22

## 2019-04-22 MED ORDER — GENERIC EXTERNAL MEDICATION
16.00 | Status: DC
Start: ? — End: 2019-04-22

## 2019-04-22 MED ORDER — PROSOURCE NO CARB PO LIQD
60.00 | ORAL | Status: DC
Start: 2019-04-29 — End: 2019-04-22

## 2019-04-22 MED ORDER — GENERIC EXTERNAL MEDICATION
0.50 | Status: DC
Start: ? — End: 2019-04-22

## 2019-04-22 MED ORDER — DEXTROSE 10 % IV SOLN
0.00 | INTRAVENOUS | Status: DC
Start: ? — End: 2019-04-22

## 2019-04-22 MED ORDER — DEXTROSE 10 % IV SOLN
75.00 | INTRAVENOUS | Status: DC
Start: ? — End: 2019-04-22

## 2019-04-22 MED ORDER — INSULIN ASPART 100 UNIT/ML ~~LOC~~ SOLN
1.00 | SUBCUTANEOUS | Status: DC
Start: 2019-04-22 — End: 2019-04-22

## 2019-04-22 MED ORDER — ALPRAZOLAM 0.5 MG PO TABS
0.50 | ORAL_TABLET | ORAL | Status: DC
Start: 2019-04-29 — End: 2019-04-22

## 2019-04-26 ENCOUNTER — Ambulatory Visit: Payer: BC Managed Care – PPO | Admitting: Radiation Oncology

## 2019-04-27 ENCOUNTER — Telehealth: Payer: Self-pay | Admitting: Oncology

## 2019-04-27 NOTE — Telephone Encounter (Signed)
Faxed most recent office visit and imaging results to Dr. Binnie Rail with [facility] @ [phone]

## 2019-04-29 ENCOUNTER — Telehealth: Payer: Self-pay | Admitting: *Deleted

## 2019-04-29 MED ORDER — FUROSEMIDE 10 MG/ML IJ SOLN
40.00 | INTRAMUSCULAR | Status: DC
Start: 2019-04-29 — End: 2019-04-29

## 2019-04-29 MED ORDER — GENERIC EXTERNAL MEDICATION
5.00 | Status: DC
Start: ? — End: 2019-04-29

## 2019-04-29 MED ORDER — ONDANSETRON HCL 4 MG/2ML IJ SOLN
4.00 | INTRAMUSCULAR | Status: DC
Start: ? — End: 2019-04-29

## 2019-04-29 MED ORDER — METOPROLOL TARTRATE 25 MG PO TABS
25.00 | ORAL_TABLET | ORAL | Status: DC
Start: 2019-04-30 — End: 2019-04-29

## 2019-04-29 MED ORDER — PREDNISONE 20 MG PO TABS
40.00 | ORAL_TABLET | ORAL | Status: DC
Start: 2019-05-01 — End: 2019-04-29

## 2019-04-29 NOTE — Telephone Encounter (Signed)
This RN received call from [facility] wanting to inquire if Dr Jana Hakim would be attending for care upon pt being discharged from [facility].  Call returned to [phone]- and informed Cassandra MD will be attending as well as gave Hospice RN desk phone number to call for issues and pod fax.

## 2019-04-30 MED ORDER — ALPRAZOLAM 0.5 MG PO TABS
.50 | ORAL_TABLET | ORAL | Status: DC
Start: 2019-04-30 — End: 2019-04-30

## 2019-04-30 MED ORDER — OXYCODONE HCL 5 MG PO TABS
5.00 | ORAL_TABLET | ORAL | Status: DC
Start: ? — End: 2019-04-30

## 2019-04-30 MED ORDER — DIPHENHYDRAMINE HCL 25 MG PO CAPS
50.00 | ORAL_CAPSULE | ORAL | Status: DC
Start: ? — End: 2019-04-30

## 2019-04-30 MED ORDER — SENNA 8.8 MG/5ML PO SYRP
8.80 | ORAL_SOLUTION | ORAL | Status: DC
Start: 2019-04-30 — End: 2019-04-30

## 2019-04-30 MED ORDER — POLYETHYLENE GLYCOL 3350 17 G PO PACK
17.00 | PACK | ORAL | Status: DC
Start: 2019-05-01 — End: 2019-04-30

## 2019-04-30 MED ORDER — FUROSEMIDE 20 MG PO TABS
20.00 | ORAL_TABLET | ORAL | Status: DC
Start: 2019-04-30 — End: 2019-04-30

## 2019-04-30 MED ORDER — DOCUSATE SODIUM 150 MG/15ML PO LIQD
100.00 | ORAL | Status: DC
Start: 2019-04-30 — End: 2019-04-30

## 2019-05-03 ENCOUNTER — Inpatient Hospital Stay: Payer: BC Managed Care – PPO

## 2019-05-03 ENCOUNTER — Inpatient Hospital Stay: Payer: BC Managed Care – PPO | Attending: Oncology

## 2019-05-03 ENCOUNTER — Other Ambulatory Visit: Payer: Self-pay | Admitting: Oncology

## 2019-05-03 ENCOUNTER — Inpatient Hospital Stay: Payer: BC Managed Care – PPO | Admitting: Oncology

## 2019-05-05 ENCOUNTER — Telehealth: Payer: Self-pay | Admitting: *Deleted

## 2019-05-05 NOTE — Telephone Encounter (Signed)
Received vm call from [facility]/Hospice/[facility] that pt expired at home this am & family is OK.  She left ph # [phone] if there were questions.  Informed Dr Jana Hakim.  Message to HIM/Kim Hegerty.

## 2019-05-06 ENCOUNTER — Other Ambulatory Visit: Payer: Self-pay | Admitting: Oncology

## 2019-05-07 ENCOUNTER — Telehealth: Payer: Self-pay | Admitting: *Deleted

## 2019-05-07 NOTE — Telephone Encounter (Signed)
This RN received faxed copy of death certificate for MD signature for cremation.  Completed and obtained sig per Dr Lindi Adie - faxed with confirmation as received.

## 2019-05-10 ENCOUNTER — Other Ambulatory Visit: Payer: Self-pay | Admitting: Oncology

## 2019-05-22 DEATH — deceased

## 2019-05-31 ENCOUNTER — Ambulatory Visit: Payer: BLUE CROSS/BLUE SHIELD

## 2019-05-31 ENCOUNTER — Other Ambulatory Visit: Payer: BLUE CROSS/BLUE SHIELD

## 2019-06-29 ENCOUNTER — Ambulatory Visit: Payer: BLUE CROSS/BLUE SHIELD

## 2019-06-29 ENCOUNTER — Other Ambulatory Visit: Payer: BLUE CROSS/BLUE SHIELD

## 2019-11-10 NOTE — Telephone Encounter (Signed)
No entry 

## 2020-01-01 IMAGING — CT NM PET TUM IMG RESTAG (PS) SKULL BASE T - THIGH
1 of 8 series · 1 of 25 positions shown · non-contrast
Comparison: 03/25/2018

CLINICAL DATA: Subsequent treatment strategy for breast cancer.

EXAM:
NUCLEAR MEDICINE PET SKULL BASE TO THIGH
TECHNIQUE: 10.3 mCi F-18 FDG was injected intravenously. Full-ring PET imaging
was performed from the skull base to thigh after the radiotracer. CT
data was obtained and used for attenuation correction and anatomic
localization.
Fasting blood glucose: 65 mg/dl

[Series 4: ct sk_thigh 5.0 b31f · axial · 5.0mm · 0.98mm/px · 1 of 230 slices shown]
[im 230/230  brain]
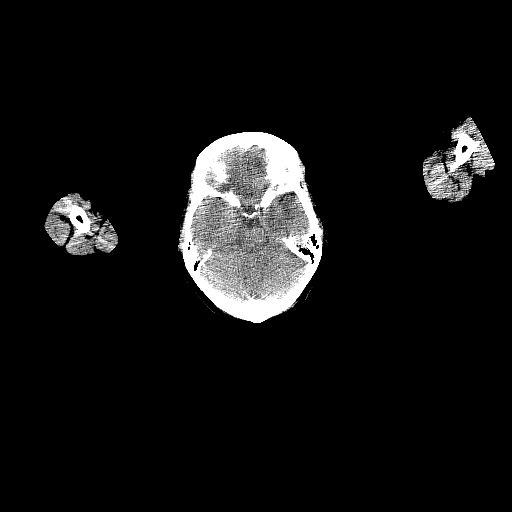

[1 of 25 positions shown; findings below may reference images not displayed]

FINDINGS: Mediastinal blood pool activity: SUV max

NECK: No hypermetabolic lymph nodes in the neck.

Incidental CT findings: none

CHEST: No hypermetabolic axillary or supraclavicular lymph nodes.
There are several new soft tissue lesions identified along the
anterior surface of the lower sternum. The largest measures 1.4 cm
and has an SUV max of 4.7. Hypermetabolic left internal mammary
lymph node measures 1.1 cm and has an SUV max of 2.7. Previously
cm within SUV max of 6.9. New right internal mammary lymph node
measures 8 mm and has an SUV max of 2.3. New right infrahilar lymph
node posterior to the right superior pulmonary vein measures 1 cm
and has an SUV max of 3.94.

Several new ill-defined nodular densities within the left upper lobe
are identified, new from previous exam. Central left upper lobe
ill-defined nodular density measures 1.2 cm and has an SUV max of
3.06. Lateral subpleural nodular density within the left upper lobe
measures 1.6 cm and has an SUV max of 3.4. Adjacent nodule measures
1.6 cm and has an SUV max of 2.7. More anteriorly there is a
subpleural nodule measuring 1 cm within SUV max of 2.13.

Incidental CT findings: Interval decrease in tracer uptake within
the medial left chest wall which on today's study has an SUV max of
2.11. Previously 3.92.

ABDOMEN/PELVIS: No abnormal activity within the liver, spleen,
pancreas, or adrenal glands. No hypermetabolic abdominal or pelvic
lymph nodes.

Incidental CT findings: Aortic atherosclerosis without aneurysm.
Previous cholecystectomy.

SKELETON: No focal hypermetabolic activity to suggest skeletal
metastasis.

Incidental CT findings: none
IMPRESSION: 1. There has been interval decrease in radiotracer uptake associated
with the medial left chest wall which may represent chest wall
involvement by tumor versus postsurgical/post therapeutic changes.
2. Interval decrease in FDG uptake associated with previously noted
hypermetabolic left internal mammary lymph node.
3. There are several new soft tissue nodules along the anterior
surface of the sternum which exhibit increased uptake and are
suspicious for of progressive areas of chest wall involvement.
4. New increased uptake associated with right internal mammary lymph
node and right hilar lymph node. Cannot rule out nodal metastasis.
5. At least 4 new ill-defined nodular densities within the left
upper lobe exhibit mild FDG uptake. Findings may reflect
inflammatory nodules in response to external beam radiation to the
left chest. Given the absence of hypermetabolic nodules in the left
lower lobe and right lung this is favored. Metastatic disease is
considered less likely but attention on follow-up imaging is
advised.
